# Patient Record
Sex: Female | Born: 1935 | Race: White | Hispanic: No | Smoking: Current every day smoker
Health system: Southern US, Community
[De-identification: ages and names within clinical notes are randomized; demographics above are authoritative.]

## PROBLEM LIST (undated history)

## (undated) DIAGNOSIS — K259 Gastric ulcer, unspecified as acute or chronic, without hemorrhage or perforation: Secondary | ICD-10-CM

## (undated) DIAGNOSIS — I739 Peripheral vascular disease, unspecified: Secondary | ICD-10-CM

## (undated) DIAGNOSIS — M79609 Pain in unspecified limb: Secondary | ICD-10-CM

## (undated) DIAGNOSIS — K5790 Diverticulosis of intestine, part unspecified, without perforation or abscess without bleeding: Secondary | ICD-10-CM

## (undated) DIAGNOSIS — J40 Bronchitis, not specified as acute or chronic: Secondary | ICD-10-CM

## (undated) DIAGNOSIS — I10 Essential (primary) hypertension: Secondary | ICD-10-CM

## (undated) DIAGNOSIS — I2510 Atherosclerotic heart disease of native coronary artery without angina pectoris: Secondary | ICD-10-CM

## (undated) DIAGNOSIS — F419 Anxiety disorder, unspecified: Secondary | ICD-10-CM

## (undated) DIAGNOSIS — M545 Low back pain, unspecified: Secondary | ICD-10-CM

## (undated) DIAGNOSIS — E7800 Pure hypercholesterolemia, unspecified: Secondary | ICD-10-CM

## (undated) DIAGNOSIS — J449 Chronic obstructive pulmonary disease, unspecified: Secondary | ICD-10-CM

## (undated) DIAGNOSIS — G4733 Obstructive sleep apnea (adult) (pediatric): Secondary | ICD-10-CM

## (undated) DIAGNOSIS — R0602 Shortness of breath: Secondary | ICD-10-CM

## (undated) DIAGNOSIS — M1990 Unspecified osteoarthritis, unspecified site: Secondary | ICD-10-CM

## (undated) DIAGNOSIS — Z8719 Personal history of other diseases of the digestive system: Secondary | ICD-10-CM

## (undated) DIAGNOSIS — Z86718 Personal history of other venous thrombosis and embolism: Secondary | ICD-10-CM

## (undated) DIAGNOSIS — R51 Headache: Secondary | ICD-10-CM

## (undated) DIAGNOSIS — R519 Headache, unspecified: Secondary | ICD-10-CM

## (undated) DIAGNOSIS — I219 Acute myocardial infarction, unspecified: Secondary | ICD-10-CM

## (undated) DIAGNOSIS — J189 Pneumonia, unspecified organism: Secondary | ICD-10-CM

## (undated) DIAGNOSIS — I209 Angina pectoris, unspecified: Secondary | ICD-10-CM

## (undated) DIAGNOSIS — D369 Benign neoplasm, unspecified site: Secondary | ICD-10-CM

## (undated) DIAGNOSIS — I701 Atherosclerosis of renal artery: Secondary | ICD-10-CM

## (undated) DIAGNOSIS — I509 Heart failure, unspecified: Secondary | ICD-10-CM

## (undated) DIAGNOSIS — K219 Gastro-esophageal reflux disease without esophagitis: Secondary | ICD-10-CM

## (undated) DIAGNOSIS — G459 Transient cerebral ischemic attack, unspecified: Secondary | ICD-10-CM

## (undated) DIAGNOSIS — A048 Other specified bacterial intestinal infections: Secondary | ICD-10-CM

## (undated) DIAGNOSIS — R490 Dysphonia: Secondary | ICD-10-CM

## (undated) DIAGNOSIS — R011 Cardiac murmur, unspecified: Secondary | ICD-10-CM

## (undated) DIAGNOSIS — R4182 Altered mental status, unspecified: Secondary | ICD-10-CM

## (undated) HISTORY — DX: Other specified bacterial intestinal infections: A04.8

## (undated) HISTORY — DX: Pure hypercholesterolemia, unspecified: E78.00

## (undated) HISTORY — DX: Bronchitis, not specified as acute or chronic: J40

## (undated) HISTORY — DX: Acute myocardial infarction, unspecified: I21.9

## (undated) HISTORY — PX: VAGINAL HYSTERECTOMY: SUR661

## (undated) HISTORY — DX: Pain in unspecified limb: M79.609

## (undated) HISTORY — DX: Obstructive sleep apnea (adult) (pediatric): G47.33

## (undated) HISTORY — DX: Personal history of other venous thrombosis and embolism: Z86.718

## (undated) HISTORY — DX: Altered mental status, unspecified: R41.82

## (undated) HISTORY — DX: Essential (primary) hypertension: I10

## (undated) HISTORY — DX: Transient cerebral ischemic attack, unspecified: G45.9

## (undated) HISTORY — DX: Pneumonia, unspecified organism: J18.9

## (undated) HISTORY — DX: Personal history of other diseases of the digestive system: Z87.19

## (undated) HISTORY — DX: Low back pain: M54.5

## (undated) HISTORY — DX: Low back pain, unspecified: M54.50

## (undated) HISTORY — DX: Chronic obstructive pulmonary disease, unspecified: J44.9

## (undated) HISTORY — DX: Gastro-esophageal reflux disease without esophagitis: K21.9

## (undated) HISTORY — DX: Atherosclerotic heart disease of native coronary artery without angina pectoris: I25.10

## (undated) HISTORY — PX: OOPHORECTOMY: SHX86

## (undated) HISTORY — DX: Peripheral vascular disease, unspecified: I73.9

## (undated) HISTORY — DX: Dysphonia: R49.0

## (undated) HISTORY — DX: Unspecified osteoarthritis, unspecified site: M19.90

## (undated) HISTORY — DX: Diverticulosis of intestine, part unspecified, without perforation or abscess without bleeding: K57.90

## (undated) HISTORY — DX: Atherosclerosis of renal artery: I70.1

## (undated) HISTORY — DX: Benign neoplasm, unspecified site: D36.9

## (undated) HISTORY — DX: Gastric ulcer, unspecified as acute or chronic, without hemorrhage or perforation: K25.9

---

## 1989-02-25 DIAGNOSIS — I219 Acute myocardial infarction, unspecified: Secondary | ICD-10-CM

## 1989-02-25 HISTORY — DX: Acute myocardial infarction, unspecified: I21.9

## 1997-06-27 DIAGNOSIS — I2510 Atherosclerotic heart disease of native coronary artery without angina pectoris: Secondary | ICD-10-CM | POA: Insufficient documentation

## 1997-10-22 ENCOUNTER — Inpatient Hospital Stay (HOSPITAL_COMMUNITY): Admission: EM | Admit: 1997-10-22 | Discharge: 1997-10-23 | Payer: Self-pay | Admitting: Emergency Medicine

## 1998-01-26 ENCOUNTER — Emergency Department (HOSPITAL_COMMUNITY): Admission: EM | Admit: 1998-01-26 | Discharge: 1998-01-26 | Payer: Self-pay | Admitting: Emergency Medicine

## 1998-07-07 ENCOUNTER — Inpatient Hospital Stay (HOSPITAL_COMMUNITY): Admission: EM | Admit: 1998-07-07 | Discharge: 1998-07-08 | Payer: Self-pay | Admitting: *Deleted

## 1998-07-07 ENCOUNTER — Encounter: Payer: Self-pay | Admitting: Cardiovascular Disease

## 1998-11-12 ENCOUNTER — Inpatient Hospital Stay (HOSPITAL_COMMUNITY): Admission: EM | Admit: 1998-11-12 | Discharge: 1998-11-14 | Payer: Self-pay | Admitting: Emergency Medicine

## 1998-11-14 ENCOUNTER — Encounter: Payer: Self-pay | Admitting: Cardiovascular Disease

## 1999-02-18 ENCOUNTER — Other Ambulatory Visit: Admission: RE | Admit: 1999-02-18 | Discharge: 1999-02-18 | Payer: Self-pay | Admitting: Obstetrics

## 1999-02-21 ENCOUNTER — Ambulatory Visit (HOSPITAL_COMMUNITY): Admission: RE | Admit: 1999-02-21 | Discharge: 1999-02-21 | Payer: Self-pay | Admitting: Family Medicine

## 1999-02-21 ENCOUNTER — Encounter: Payer: Self-pay | Admitting: Family Medicine

## 1999-03-05 ENCOUNTER — Encounter: Admission: RE | Admit: 1999-03-05 | Discharge: 1999-06-03 | Payer: Self-pay | Admitting: Family Medicine

## 1999-03-31 ENCOUNTER — Inpatient Hospital Stay (HOSPITAL_COMMUNITY): Admission: EM | Admit: 1999-03-31 | Discharge: 1999-04-02 | Payer: Self-pay | Admitting: Emergency Medicine

## 1999-03-31 ENCOUNTER — Encounter: Payer: Self-pay | Admitting: Emergency Medicine

## 1999-04-02 ENCOUNTER — Encounter: Payer: Self-pay | Admitting: Cardiology

## 1999-06-02 ENCOUNTER — Encounter: Admission: RE | Admit: 1999-06-02 | Discharge: 1999-08-31 | Payer: Self-pay | Admitting: Family Medicine

## 1999-07-13 ENCOUNTER — Ambulatory Visit (HOSPITAL_COMMUNITY): Admission: RE | Admit: 1999-07-13 | Discharge: 1999-07-13 | Payer: Self-pay | Admitting: Family Medicine

## 1999-07-13 ENCOUNTER — Encounter: Payer: Self-pay | Admitting: Family Medicine

## 1999-07-14 ENCOUNTER — Encounter: Admission: RE | Admit: 1999-07-14 | Discharge: 1999-10-12 | Payer: Self-pay | Admitting: Ophthalmology

## 1999-11-17 ENCOUNTER — Emergency Department (HOSPITAL_COMMUNITY): Admission: EM | Admit: 1999-11-17 | Discharge: 1999-11-17 | Payer: Self-pay | Admitting: Emergency Medicine

## 1999-11-17 ENCOUNTER — Encounter: Payer: Self-pay | Admitting: Emergency Medicine

## 1999-11-26 DIAGNOSIS — I701 Atherosclerosis of renal artery: Secondary | ICD-10-CM | POA: Insufficient documentation

## 1999-11-26 DIAGNOSIS — I739 Peripheral vascular disease, unspecified: Secondary | ICD-10-CM

## 1999-12-06 ENCOUNTER — Inpatient Hospital Stay (HOSPITAL_COMMUNITY): Admission: EM | Admit: 1999-12-06 | Discharge: 1999-12-08 | Payer: Self-pay | Admitting: Emergency Medicine

## 1999-12-07 ENCOUNTER — Encounter: Payer: Self-pay | Admitting: Cardiology

## 2000-01-10 ENCOUNTER — Encounter (HOSPITAL_COMMUNITY): Admission: RE | Admit: 2000-01-10 | Discharge: 2000-04-09 | Payer: Self-pay | Admitting: Emergency Medicine

## 2001-08-28 ENCOUNTER — Encounter: Payer: Self-pay | Admitting: Emergency Medicine

## 2001-08-29 ENCOUNTER — Inpatient Hospital Stay (HOSPITAL_COMMUNITY): Admission: EM | Admit: 2001-08-29 | Discharge: 2001-08-30 | Payer: Self-pay | Admitting: Emergency Medicine

## 2001-08-29 ENCOUNTER — Encounter: Payer: Self-pay | Admitting: Pediatrics

## 2001-10-05 ENCOUNTER — Encounter: Payer: Self-pay | Admitting: Internal Medicine

## 2001-10-05 ENCOUNTER — Ambulatory Visit (HOSPITAL_COMMUNITY): Admission: RE | Admit: 2001-10-05 | Discharge: 2001-10-05 | Payer: Self-pay | Admitting: Internal Medicine

## 2001-11-08 ENCOUNTER — Emergency Department (HOSPITAL_COMMUNITY): Admission: EM | Admit: 2001-11-08 | Discharge: 2001-11-09 | Payer: Self-pay | Admitting: Emergency Medicine

## 2001-11-08 ENCOUNTER — Encounter: Payer: Self-pay | Admitting: Emergency Medicine

## 2002-01-14 ENCOUNTER — Encounter: Payer: Self-pay | Admitting: Internal Medicine

## 2002-01-14 ENCOUNTER — Ambulatory Visit (HOSPITAL_COMMUNITY): Admission: RE | Admit: 2002-01-14 | Discharge: 2002-01-14 | Payer: Self-pay | Admitting: Internal Medicine

## 2002-04-19 ENCOUNTER — Ambulatory Visit (HOSPITAL_COMMUNITY): Admission: RE | Admit: 2002-04-19 | Discharge: 2002-04-19 | Payer: Self-pay | Admitting: Cardiovascular Disease

## 2002-04-19 ENCOUNTER — Encounter: Payer: Self-pay | Admitting: Cardiovascular Disease

## 2002-07-31 ENCOUNTER — Ambulatory Visit (HOSPITAL_COMMUNITY): Admission: RE | Admit: 2002-07-31 | Discharge: 2002-07-31 | Payer: Self-pay | Admitting: Internal Medicine

## 2002-07-31 ENCOUNTER — Encounter: Payer: Self-pay | Admitting: Internal Medicine

## 2002-08-26 DIAGNOSIS — J40 Bronchitis, not specified as acute or chronic: Secondary | ICD-10-CM

## 2002-09-04 ENCOUNTER — Encounter (INDEPENDENT_AMBULATORY_CARE_PROVIDER_SITE_OTHER): Payer: Self-pay | Admitting: *Deleted

## 2002-09-04 ENCOUNTER — Ambulatory Visit (HOSPITAL_COMMUNITY): Admission: RE | Admit: 2002-09-04 | Discharge: 2002-09-04 | Payer: Self-pay | Admitting: Internal Medicine

## 2002-09-06 ENCOUNTER — Ambulatory Visit (HOSPITAL_BASED_OUTPATIENT_CLINIC_OR_DEPARTMENT_OTHER): Admission: RE | Admit: 2002-09-06 | Discharge: 2002-09-06 | Payer: Self-pay | Admitting: Internal Medicine

## 2002-09-06 DIAGNOSIS — G4733 Obstructive sleep apnea (adult) (pediatric): Secondary | ICD-10-CM

## 2004-01-04 ENCOUNTER — Emergency Department (HOSPITAL_COMMUNITY): Admission: EM | Admit: 2004-01-04 | Discharge: 2004-01-04 | Payer: Self-pay | Admitting: Emergency Medicine

## 2004-06-27 HISTORY — PX: CORONARY ANGIOPLASTY WITH STENT PLACEMENT: SHX49

## 2004-10-04 ENCOUNTER — Ambulatory Visit (HOSPITAL_COMMUNITY): Admission: RE | Admit: 2004-10-04 | Discharge: 2004-10-05 | Payer: Self-pay | Admitting: Cardiovascular Disease

## 2004-11-03 ENCOUNTER — Ambulatory Visit: Payer: Self-pay | Admitting: Family Medicine

## 2004-12-21 ENCOUNTER — Emergency Department (HOSPITAL_COMMUNITY): Admission: EM | Admit: 2004-12-21 | Discharge: 2004-12-21 | Payer: Self-pay | Admitting: Family Medicine

## 2005-03-02 ENCOUNTER — Emergency Department (HOSPITAL_COMMUNITY): Admission: EM | Admit: 2005-03-02 | Discharge: 2005-03-02 | Payer: Self-pay | Admitting: Family Medicine

## 2005-04-14 ENCOUNTER — Ambulatory Visit: Payer: Self-pay | Admitting: Family Medicine

## 2005-06-25 ENCOUNTER — Emergency Department (HOSPITAL_COMMUNITY): Admission: EM | Admit: 2005-06-25 | Discharge: 2005-06-25 | Payer: Self-pay | Admitting: Family Medicine

## 2005-08-21 ENCOUNTER — Emergency Department (HOSPITAL_COMMUNITY): Admission: EM | Admit: 2005-08-21 | Discharge: 2005-08-21 | Payer: Self-pay | Admitting: Family Medicine

## 2005-08-25 DIAGNOSIS — Z8719 Personal history of other diseases of the digestive system: Secondary | ICD-10-CM | POA: Insufficient documentation

## 2005-09-01 ENCOUNTER — Ambulatory Visit: Payer: Self-pay | Admitting: Family Medicine

## 2005-09-02 ENCOUNTER — Ambulatory Visit (HOSPITAL_COMMUNITY): Admission: RE | Admit: 2005-09-02 | Discharge: 2005-09-02 | Payer: Self-pay | Admitting: Family Medicine

## 2005-09-02 ENCOUNTER — Ambulatory Visit: Payer: Self-pay | Admitting: Family Medicine

## 2005-09-08 ENCOUNTER — Ambulatory Visit: Payer: Self-pay | Admitting: Family Medicine

## 2005-09-09 ENCOUNTER — Inpatient Hospital Stay (HOSPITAL_COMMUNITY): Admission: EM | Admit: 2005-09-09 | Discharge: 2005-09-11 | Payer: Self-pay | Admitting: Emergency Medicine

## 2005-09-09 ENCOUNTER — Ambulatory Visit: Payer: Self-pay | Admitting: Gastroenterology

## 2005-09-10 ENCOUNTER — Encounter (INDEPENDENT_AMBULATORY_CARE_PROVIDER_SITE_OTHER): Payer: Self-pay | Admitting: Specialist

## 2005-09-10 ENCOUNTER — Encounter: Payer: Self-pay | Admitting: Internal Medicine

## 2005-09-11 ENCOUNTER — Encounter: Payer: Self-pay | Admitting: Internal Medicine

## 2005-09-11 ENCOUNTER — Encounter (INDEPENDENT_AMBULATORY_CARE_PROVIDER_SITE_OTHER): Payer: Self-pay | Admitting: *Deleted

## 2005-10-29 ENCOUNTER — Emergency Department (HOSPITAL_COMMUNITY): Admission: EM | Admit: 2005-10-29 | Discharge: 2005-10-29 | Payer: Self-pay | Admitting: Family Medicine

## 2005-12-07 ENCOUNTER — Emergency Department (HOSPITAL_COMMUNITY): Admission: EM | Admit: 2005-12-07 | Discharge: 2005-12-07 | Payer: Self-pay | Admitting: Family Medicine

## 2005-12-15 ENCOUNTER — Ambulatory Visit: Payer: Self-pay | Admitting: Family Medicine

## 2006-01-23 ENCOUNTER — Ambulatory Visit: Payer: Self-pay | Admitting: Family Medicine

## 2006-02-05 ENCOUNTER — Emergency Department (HOSPITAL_COMMUNITY): Admission: EM | Admit: 2006-02-05 | Discharge: 2006-02-05 | Payer: Self-pay | Admitting: Family Medicine

## 2006-03-02 ENCOUNTER — Ambulatory Visit: Payer: Self-pay | Admitting: Family Medicine

## 2006-03-10 ENCOUNTER — Ambulatory Visit: Payer: Self-pay | Admitting: Internal Medicine

## 2006-03-11 ENCOUNTER — Emergency Department (HOSPITAL_COMMUNITY): Admission: EM | Admit: 2006-03-11 | Discharge: 2006-03-11 | Payer: Self-pay | Admitting: Emergency Medicine

## 2006-03-16 ENCOUNTER — Ambulatory Visit: Payer: Self-pay | Admitting: Internal Medicine

## 2006-03-18 ENCOUNTER — Emergency Department (HOSPITAL_COMMUNITY): Admission: EM | Admit: 2006-03-18 | Discharge: 2006-03-18 | Payer: Self-pay | Admitting: Family Medicine

## 2006-04-24 ENCOUNTER — Ambulatory Visit: Payer: Self-pay | Admitting: Family Medicine

## 2006-08-21 ENCOUNTER — Ambulatory Visit: Payer: Self-pay | Admitting: Family Medicine

## 2006-08-30 ENCOUNTER — Inpatient Hospital Stay (HOSPITAL_COMMUNITY): Admission: EM | Admit: 2006-08-30 | Discharge: 2006-09-01 | Payer: Self-pay | Admitting: Emergency Medicine

## 2006-09-11 ENCOUNTER — Ambulatory Visit: Payer: Self-pay | Admitting: Family Medicine

## 2006-09-11 ENCOUNTER — Encounter: Payer: Self-pay | Admitting: Internal Medicine

## 2006-10-15 ENCOUNTER — Emergency Department (HOSPITAL_COMMUNITY): Admission: EM | Admit: 2006-10-15 | Discharge: 2006-10-15 | Payer: Self-pay | Admitting: Family Medicine

## 2006-11-15 ENCOUNTER — Emergency Department (HOSPITAL_COMMUNITY): Admission: EM | Admit: 2006-11-15 | Discharge: 2006-11-15 | Payer: Self-pay | Admitting: Family Medicine

## 2007-01-10 ENCOUNTER — Ambulatory Visit: Payer: Self-pay | Admitting: Family Medicine

## 2007-02-19 ENCOUNTER — Telehealth (INDEPENDENT_AMBULATORY_CARE_PROVIDER_SITE_OTHER): Payer: Self-pay | Admitting: Nurse Practitioner

## 2007-02-19 DIAGNOSIS — R51 Headache: Secondary | ICD-10-CM

## 2007-02-19 DIAGNOSIS — K219 Gastro-esophageal reflux disease without esophagitis: Secondary | ICD-10-CM

## 2007-02-19 DIAGNOSIS — R519 Headache, unspecified: Secondary | ICD-10-CM | POA: Insufficient documentation

## 2007-02-19 DIAGNOSIS — R498 Other voice and resonance disorders: Secondary | ICD-10-CM

## 2007-02-19 DIAGNOSIS — I10 Essential (primary) hypertension: Secondary | ICD-10-CM

## 2007-02-19 DIAGNOSIS — E7800 Pure hypercholesterolemia, unspecified: Secondary | ICD-10-CM

## 2007-02-19 DIAGNOSIS — J849 Interstitial pulmonary disease, unspecified: Secondary | ICD-10-CM | POA: Insufficient documentation

## 2007-02-19 DIAGNOSIS — M79609 Pain in unspecified limb: Secondary | ICD-10-CM

## 2007-02-19 DIAGNOSIS — M545 Low back pain: Secondary | ICD-10-CM

## 2007-02-21 ENCOUNTER — Encounter (INDEPENDENT_AMBULATORY_CARE_PROVIDER_SITE_OTHER): Payer: Self-pay | Admitting: Family Medicine

## 2007-02-21 ENCOUNTER — Ambulatory Visit: Payer: Self-pay | Admitting: Internal Medicine

## 2007-02-21 LAB — CONVERTED CEMR LAB
Alkaline Phosphatase: 92 units/L (ref 39–117)
Basophils Relative: 1 % (ref 0–1)
CO2: 21 meq/L (ref 19–32)
Eosinophils Absolute: 0.1 10*3/uL (ref 0.0–0.7)
Glucose, Bld: 121 mg/dL — ABNORMAL HIGH (ref 70–99)
Lymphocytes Relative: 28 % (ref 12–46)
MCHC: 31.3 g/dL (ref 30.0–36.0)
MCV: 90.7 fL (ref 78.0–100.0)
Neutro Abs: 6 10*3/uL (ref 1.7–7.7)
Platelets: 276 10*3/uL (ref 150–400)
RDW: 14.7 % — ABNORMAL HIGH (ref 11.5–14.0)
Total Bilirubin: 0.4 mg/dL (ref 0.3–1.2)

## 2007-04-11 ENCOUNTER — Emergency Department (HOSPITAL_COMMUNITY): Admission: EM | Admit: 2007-04-11 | Discharge: 2007-04-11 | Payer: Self-pay | Admitting: Family Medicine

## 2007-04-13 ENCOUNTER — Telehealth (INDEPENDENT_AMBULATORY_CARE_PROVIDER_SITE_OTHER): Payer: Self-pay | Admitting: *Deleted

## 2007-04-23 ENCOUNTER — Telehealth (INDEPENDENT_AMBULATORY_CARE_PROVIDER_SITE_OTHER): Payer: Self-pay | Admitting: Family Medicine

## 2007-05-29 ENCOUNTER — Telehealth (INDEPENDENT_AMBULATORY_CARE_PROVIDER_SITE_OTHER): Payer: Self-pay | Admitting: Family Medicine

## 2007-05-30 ENCOUNTER — Ambulatory Visit: Payer: Self-pay | Admitting: Family Medicine

## 2007-06-25 ENCOUNTER — Telehealth (INDEPENDENT_AMBULATORY_CARE_PROVIDER_SITE_OTHER): Payer: Self-pay | Admitting: Family Medicine

## 2007-07-01 ENCOUNTER — Emergency Department (HOSPITAL_COMMUNITY): Admission: EM | Admit: 2007-07-01 | Discharge: 2007-07-01 | Payer: Self-pay | Admitting: Emergency Medicine

## 2007-07-10 ENCOUNTER — Telehealth (INDEPENDENT_AMBULATORY_CARE_PROVIDER_SITE_OTHER): Payer: Self-pay | Admitting: Family Medicine

## 2007-07-25 ENCOUNTER — Ambulatory Visit: Payer: Self-pay | Admitting: Family Medicine

## 2007-08-15 ENCOUNTER — Telehealth (INDEPENDENT_AMBULATORY_CARE_PROVIDER_SITE_OTHER): Payer: Self-pay | Admitting: Family Medicine

## 2007-08-25 ENCOUNTER — Emergency Department (HOSPITAL_COMMUNITY): Admission: EM | Admit: 2007-08-25 | Discharge: 2007-08-25 | Payer: Self-pay | Admitting: Family Medicine

## 2007-10-16 ENCOUNTER — Emergency Department (HOSPITAL_COMMUNITY): Admission: EM | Admit: 2007-10-16 | Discharge: 2007-10-16 | Payer: Self-pay | Admitting: Family Medicine

## 2007-11-26 ENCOUNTER — Emergency Department (HOSPITAL_COMMUNITY): Admission: EM | Admit: 2007-11-26 | Discharge: 2007-11-26 | Payer: Self-pay | Admitting: Family Medicine

## 2007-12-14 ENCOUNTER — Telehealth (INDEPENDENT_AMBULATORY_CARE_PROVIDER_SITE_OTHER): Payer: Self-pay | Admitting: Family Medicine

## 2007-12-19 ENCOUNTER — Telehealth (INDEPENDENT_AMBULATORY_CARE_PROVIDER_SITE_OTHER): Payer: Self-pay | Admitting: Family Medicine

## 2007-12-19 ENCOUNTER — Encounter (INDEPENDENT_AMBULATORY_CARE_PROVIDER_SITE_OTHER): Payer: Self-pay | Admitting: Family Medicine

## 2008-01-31 ENCOUNTER — Emergency Department (HOSPITAL_COMMUNITY): Admission: EM | Admit: 2008-01-31 | Discharge: 2008-01-31 | Payer: Self-pay | Admitting: Family Medicine

## 2008-02-08 ENCOUNTER — Encounter (INDEPENDENT_AMBULATORY_CARE_PROVIDER_SITE_OTHER): Payer: Self-pay | Admitting: Family Medicine

## 2008-03-11 ENCOUNTER — Encounter (INDEPENDENT_AMBULATORY_CARE_PROVIDER_SITE_OTHER): Payer: Self-pay | Admitting: Family Medicine

## 2008-06-03 ENCOUNTER — Encounter (INDEPENDENT_AMBULATORY_CARE_PROVIDER_SITE_OTHER): Payer: Self-pay | Admitting: Family Medicine

## 2008-09-29 ENCOUNTER — Encounter (INDEPENDENT_AMBULATORY_CARE_PROVIDER_SITE_OTHER): Payer: Self-pay | Admitting: Family Medicine

## 2008-12-22 ENCOUNTER — Encounter (INDEPENDENT_AMBULATORY_CARE_PROVIDER_SITE_OTHER): Payer: Self-pay | Admitting: Nurse Practitioner

## 2009-01-30 ENCOUNTER — Encounter (INDEPENDENT_AMBULATORY_CARE_PROVIDER_SITE_OTHER): Payer: Self-pay | Admitting: Nurse Practitioner

## 2009-02-26 ENCOUNTER — Emergency Department (HOSPITAL_COMMUNITY): Admission: EM | Admit: 2009-02-26 | Discharge: 2009-02-26 | Payer: Self-pay | Admitting: Family Medicine

## 2009-03-19 ENCOUNTER — Inpatient Hospital Stay (HOSPITAL_COMMUNITY): Admission: EM | Admit: 2009-03-19 | Discharge: 2009-03-23 | Payer: Self-pay | Admitting: Emergency Medicine

## 2009-10-11 ENCOUNTER — Emergency Department (HOSPITAL_COMMUNITY): Admission: EM | Admit: 2009-10-11 | Discharge: 2009-10-11 | Payer: Self-pay | Admitting: Family Medicine

## 2010-06-27 DIAGNOSIS — D369 Benign neoplasm, unspecified site: Secondary | ICD-10-CM

## 2010-06-27 HISTORY — DX: Benign neoplasm, unspecified site: D36.9

## 2010-07-27 ENCOUNTER — Encounter
Admission: RE | Admit: 2010-07-27 | Discharge: 2010-07-27 | Payer: Self-pay | Source: Home / Self Care | Attending: Family Medicine | Admitting: Family Medicine

## 2010-07-27 ENCOUNTER — Encounter: Payer: Self-pay | Admitting: Internal Medicine

## 2010-07-28 DIAGNOSIS — G459 Transient cerebral ischemic attack, unspecified: Secondary | ICD-10-CM

## 2010-07-28 HISTORY — DX: Transient cerebral ischemic attack, unspecified: G45.9

## 2010-08-06 ENCOUNTER — Encounter (INDEPENDENT_AMBULATORY_CARE_PROVIDER_SITE_OTHER): Payer: Self-pay | Admitting: *Deleted

## 2010-08-09 ENCOUNTER — Emergency Department (HOSPITAL_COMMUNITY): Payer: Medicare Other

## 2010-08-09 ENCOUNTER — Emergency Department (HOSPITAL_COMMUNITY)
Admission: EM | Admit: 2010-08-09 | Discharge: 2010-08-10 | Disposition: A | Discharge status: Home / Self Care | Payer: Medicare Other | Attending: Emergency Medicine | Admitting: Emergency Medicine

## 2010-08-09 DIAGNOSIS — J4489 Other specified chronic obstructive pulmonary disease: Secondary | ICD-10-CM | POA: Insufficient documentation

## 2010-08-09 DIAGNOSIS — IMO0002 Reserved for concepts with insufficient information to code with codable children: Secondary | ICD-10-CM | POA: Insufficient documentation

## 2010-08-09 DIAGNOSIS — I252 Old myocardial infarction: Secondary | ICD-10-CM | POA: Insufficient documentation

## 2010-08-09 DIAGNOSIS — I10 Essential (primary) hypertension: Secondary | ICD-10-CM | POA: Insufficient documentation

## 2010-08-09 DIAGNOSIS — J449 Chronic obstructive pulmonary disease, unspecified: Secondary | ICD-10-CM | POA: Insufficient documentation

## 2010-08-09 DIAGNOSIS — R51 Headache: Secondary | ICD-10-CM | POA: Insufficient documentation

## 2010-08-09 DIAGNOSIS — S1093XA Contusion of unspecified part of neck, initial encounter: Secondary | ICD-10-CM | POA: Insufficient documentation

## 2010-08-09 DIAGNOSIS — K219 Gastro-esophageal reflux disease without esophagitis: Secondary | ICD-10-CM | POA: Insufficient documentation

## 2010-08-09 DIAGNOSIS — I2510 Atherosclerotic heart disease of native coronary artery without angina pectoris: Secondary | ICD-10-CM | POA: Insufficient documentation

## 2010-08-09 DIAGNOSIS — S0990XA Unspecified injury of head, initial encounter: Secondary | ICD-10-CM | POA: Insufficient documentation

## 2010-08-09 DIAGNOSIS — E7800 Pure hypercholesterolemia, unspecified: Secondary | ICD-10-CM | POA: Insufficient documentation

## 2010-08-09 DIAGNOSIS — S0003XA Contusion of scalp, initial encounter: Secondary | ICD-10-CM | POA: Insufficient documentation

## 2010-08-09 LAB — BASIC METABOLIC PANEL
BUN: 18 mg/dL (ref 6–23)
Chloride: 109 mEq/L (ref 96–112)
Creatinine, Ser: 0.96 mg/dL (ref 0.4–1.2)
Glucose, Bld: 113 mg/dL — ABNORMAL HIGH (ref 70–99)
Potassium: 3.4 mEq/L — ABNORMAL LOW (ref 3.5–5.1)

## 2010-08-09 LAB — DIFFERENTIAL
Eosinophils Relative: 0 % (ref 0–5)
Lymphocytes Relative: 25 % (ref 12–46)
Lymphs Abs: 2.2 10*3/uL (ref 0.7–4.0)
Monocytes Relative: 9 % (ref 3–12)
Neutrophils Relative %: 65 % (ref 43–77)

## 2010-08-09 LAB — CBC
HCT: 34.5 % — ABNORMAL LOW (ref 36.0–46.0)
MCH: 29.3 pg (ref 26.0–34.0)
MCV: 88.7 fL (ref 78.0–100.0)
RBC: 3.89 MIL/uL (ref 3.87–5.11)
WBC: 8.7 10*3/uL (ref 4.0–10.5)

## 2010-08-10 ENCOUNTER — Encounter: Payer: Self-pay | Admitting: Internal Medicine

## 2010-08-10 LAB — TYPE AND SCREEN
ABO/RH(D): O POS
Antibody Screen: NEGATIVE

## 2010-08-11 ENCOUNTER — Encounter (INDEPENDENT_AMBULATORY_CARE_PROVIDER_SITE_OTHER): Payer: Self-pay | Admitting: *Deleted

## 2010-08-13 ENCOUNTER — Telehealth: Payer: Self-pay | Admitting: Gastroenterology

## 2010-08-13 ENCOUNTER — Encounter: Payer: Self-pay | Admitting: Internal Medicine

## 2010-08-13 ENCOUNTER — Other Ambulatory Visit: Payer: Self-pay | Admitting: Internal Medicine

## 2010-08-13 DIAGNOSIS — Z1231 Encounter for screening mammogram for malignant neoplasm of breast: Secondary | ICD-10-CM
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
## 2010-08-18 NOTE — Miscellaneous (Signed)
Summary: COLONOSCOPY  Clinical Lists Changes  <no value>    [Prescriptions]Patient Name: Kathy, Marshall MRN: [id number] Procedure Procedures: Colonoscopy CPT: [phone].  Personnel: Endoscopist: Debra Colon L. Juanda Chance, MD.  Exam Location: Exam performed in Endoscopy Suite.  Patient Consent: Procedure, Alternatives, Risks and Benefits discussed, consent obtained, from patient. Consent was obtained by the RN.  Indications  Evaluation of: Anemia  Symptoms: Hematochezia.  History  Current Medications: Patient is on an anticoagulant. Patient is not currently taking Coumadin.  Pre-Exam Physical: Performed Sep 10, 2005. Entire physical exam was normal. Abnormal PE findings include: maroon stool.  Comments: Pt. history reviewed/updated, physical exam performed prior to initiation of sedation? Exam Exam: Extent of exam reached: Cecum, extent intended: Cecum.  The cecum was identified by appendiceal orifice and IC valve. Images taken. ASA Classification: II. Tolerance: good.  Monitoring: Pulse and BP monitoring, Oximetry used. Supplemental O2 given.  Colon Prep Used Miralax for colon prep. Prep results: fair, exam compromised.  Fluoroscopy: Fluoroscopy was not used.  Sedation Meds: Patient assessed and found to be appropriate for moderate (conscious) sedation. Fentanyl Versed  Findings - NORMAL EXAM: Cecum. Comments: greenish brown stool.  - DIVERTICULOSIS: Descending Colon to Sigmoid Colon. Not bleeding. ICD9: Diverticulosis, with bleeding: 562.12. Comments: moderately severe left colon diverticulosis,.  - OTHER FINDING: maroon brown stained liquid stool, found in Sigmoid Colon.   Assessment Abnormal examination, see findings above.  Diagnoses: 562.12: Diverticulosis, with bleeding.   Comments: left colon bleed, resolving, probably diverticular Events  Unplanned Interventions: No intervention was required.  Unplanned Events: There were no  complications. Plans Patient Education: Patient given standard instructions for: Yearly hemoccult testing recommended.  Comments: see chart for plan of treatment Disposition: After procedure patient sent to recovery.    cc: Sherin Quarry, MD This report was created from the original endoscopy report, which was reviewed and signed by the above listed endoscopist.

## 2010-08-18 NOTE — Progress Notes (Signed)
Summary: Triage  Phone Note Call from Patient Call back at Home Phone [phone]   Caller: Morrie Sheldon from [facility] Call For: Jarold Motto Reason for Call: Talk to Nurse Summary of Call: [phone] choose 0 and ask for [facility] after 1:oo, patient has had 50 LB weight loss in one year and they can not find a cause, Doctor is requesting her to be seen and then a possible colon or EGD, they have no record of GI hx and Patient has no recolection of ever seeing GI or having a colon, want her seen sooner that our first available Initial call taken by: [place] Johnson,  August 13, 2010 11:54 AM  Follow-up for Phone Call        Spoke with patient and her daughter informing them of appointment with Dr Juanda Chance. Daughter stated understanding. Follow-up by: Graciella Freer RN,  August 13, 2010 3:22 PM

## 2010-08-18 NOTE — Miscellaneous (Signed)
Summary: CT SCAN Chest Abdomen Pelvis  CT Abd/Pelvis W CM - STATUS: Final  IMAGE                                     Perform Date: 31Jan12 14:40  Ordered By: Ethelene Browns Date: 31Jan12 12:09  Facility: [facility]                              Department: CT  Service Report Text  [facility] Accession Number: [id number]      Clinical Data:  Dysphagia, 60 pounds weight loss over last 8-10   months, history of smoking    CT CHEST, ABDOMEN AND PELVIS WITH CONTRAST    Technique:  Multidetector CT imaging of the chest, abdomen and   pelvis was performed following the standard protocol during bolus   administration of intravenous contrast.    Contrast: 100 ml Omnipaque-300    Comparison:  Chest x-ray of 10/11/2009    CT CHEST    Findings:  On the lung window images there are only faint   questionable nodules in the posterior medial right upper lobe   inferiorly of questionable significance.  No other lung nodules are   seen.  There is a triangular opacity within the medial right lower   lobe adjacent to the posterior heart border most consistent with   scarring.  No definite pulmonary nodule is seen.  No pleural   effusion is noted.    On soft tissue window images, the thyroid gland is unremarkable.   Atheromatous changes noted throughout the entire thoracic aorta.   Coronary artery calcifications are noted and there is cardiomegaly   present.  There are a few mediastinal nodes, none of which are   pathologically enlarged.    IMPRESSION:    1.  Poorly defined small nodular opacities in the posterior medial   right upper lobe of doubtful significance.  No active infiltrate or   effusion.   2.  Cardiomegaly and coronary artery calcifications.   3.  Diffuse atheromatous change throughout the thoracic aorta.   4.  Probable scarring medially in the right lower lobe.    CT ABDOMEN AND PELVIS    Findings:  The liver enhances and there is a tiny peripheral   subcapsular  low attenuation structure in the lateral segment of the   left lobe of liver of doubtful significance.  No ductal dilatation   is seen.  The portal vein opacifies.  The gallbladder is visualized   and no gallstones are seen.  The pancreas is somewhat atrophic and   the pancreatic duct is not dilated.  The adrenal glands and spleen   are unremarkable.  The stomach is moderately fluid distended and   unremarkable.  Sizable renal cysts emanate from the upper poles of   both kidneys.  No renal mass is seen and there is no evidence of   calculus or hydronephrosis.  The abdominal aorta is normal in   caliber with diffuse atheromatous change.  No adenopathy is seen.    There is feces scattered throughout the colon.  No colonic masses   evident by CT.  The urinary bladder is unremarkable.  No pelvic   mass is seen and no fluid or adenopathy is noted.  No bony   abnormality  is seen.    IMPRESSION:    1.  No abdominal or pelvic mass or adenopathy.   2.  Moderate amount of feces throughout the colon.    Read By:  Juline Patch,  M.D.   Released By:  Juline Patch,  M.D.  Additional Information  HL7 RESULT STATUS : F  External image : [id number]  External IF Update Timestamp : 2010-07-27:15:08:29.000000 Clinical Lists Changes

## 2010-08-18 NOTE — Miscellaneous (Signed)
Summary: Discharge Summary   NAME:  Kathy Marshall NO.:  [id number]   MEDICAL RECORD NO.:  [id number]          PATIENT TYPE:  INP   LOCATION:  2009                         FACILITY:  [facility]   PHYSICIAN:  Sherin Quarry, MD      DATE OF BIRTH:  [birth year]   DATE OF ADMISSION:  09/08/2005  DATE OF DISCHARGE:  09/11/2005                                 DISCHARGE SUMMARY   Kathy Marshall is a 76 year old lady with a past history of coronary  artery disease status post PTCA, COPD, hypertension, hyperlipidemia,  peripheral vascular disease and chronic tobacco abuse. The patient presented  to [facility] Emergency Room on September 08, 2005 with a one-day  history of rectal bleeding. During that period of time, she also experienced  intermittent abdominal cramping. Rectal exam in the emergency room confirmed  the presence of rectal bleeding. The patient was noted to have a blood  pressure 110/69 and her hemoglobin was 12.6. She was therefore admitted to  the hospital for further evaluation of rectal bleeding.   Physical exam at time of admission as described by Dr. Donnalee Curry: The  temperature was 99, blood pressure 110/69, pulse 66, respirations 20, O2  saturation 99%. HEENT exam is within normal limits. The chest was remarkable  for coarse breath sounds which were slightly diminished. Cardiovascular exam  revealed normal S1-S2. There were no rubs, murmurs or gallops. The abdomen  was soft. Bowel sounds were present. There was very mild lower quadrant  abdominal tenderness without guarding or rebound. Neurologic testing was  within normal limits. Examination of the extremities was within normal  limits.   INR was 0.9. Sodium 138, potassium 4.4, glucose was 109, creatinine 1, BUN  26. Liver functions were normal. On admission, the patient's Plavix therapy  was discontinued and she was continued on her other medications.  Consultation was obtained from Dr.  Christella Hartigan of [facility] and  plan was made to proceed with upper endoscopy and colon examination. Upper  endoscopy showed evidence of esophagitis apparently secondary to Candida.  Colonoscopy was not ideally prepped but no bleeding lesions could be  identified and the patient was noted to have moderate diverticulosis. It was  assumed by Dr. Juanda Chance that the source of the patient's lower  gastrointestinal bleeding was diverticulosis. No further bleeding occurred.  On September 11, 2005, the patient was advanced to a regular diet which she  seemed to tolerate well. Her hemoglobin was very closely monitored in the  hospital and was in the range of 10-11 throughout the hospitalization.   DISCHARGE DIAGNOSES:  1.  Fungal esophagitis.  2.  Lower gastrointestinal bleeding presumably secondary to diverticulosis.  3.  Hypertension.  4.  Coronary disease and status post percutaneous transluminal coronary      angioplasty.  5.  Peripheral vascular disease.  6.  Chronic obstructive pulmonary disease.  7.  Hyperlipidemia.   At the time of discharge, the patient was advised to stop Plavix for a  period of 10 days. She was also advised to continue Prevacid, Lasix 20  milligrams daily, nitroglycerin p.r.n., Lipitor 80 milligrams  at bedtime  daily, Toprol 50 milligrams daily, Neurontin 300 milligrams at bedtime  daily, Isordil 120 milligrams daily, Norvasc 10 milligrams daily and Pletal  100 milligrams twice daily.   She was advised to follow up with [facility] in two to three days. She was  also advised to take Diflucan 100 milligrams daily for three days to take  iron 325 milligrams b.i.d. and to take Metamucil one dose daily. When she  returns [facility], it would be prudent to obtain a follow-up  hemoglobin/hematocrit.           ______________________________  Sherin Quarry, MD     SY/MEDQ  D:  09/11/2005  T:  09/12/2005  Job:  [id number]   cc:   [name], M.D. [facility]  [address]  [place]  [place] [address]   [facility]

## 2010-08-18 NOTE — Letter (Signed)
Summary: New Patient letter  [facility]  [address] [place], [place] [address]   Phone: [phone]  Fax: [phone]       08/13/2010 MRN: [id number]  [facility] [address] [facility] [place], [place]  [address]  Dear Kathy Marshall,  Welcome to the Gastroenterology Division at [facility].    You are scheduled to see Dr.  [name] on August 25, 2010 at 11:15am on the 3rd floor at [facility], [address]  We ask that you try to arrive at our office 15 minutes prior to your appointment time to allow for check-in.  We would like you to complete the enclosed self-administered evaluation form prior to your visit and bring it with you on the day of your appointment.  We will review it with you.  Also, please bring a complete list of all your medications or, if you prefer, bring the medication bottles and we will list them.  Please bring your insurance card so that we may make a copy of it.  If your insurance requires a referral to see a specialist, please bring your referral form from your primary care physician.  Co-payments are due at the time of your visit and may be paid by cash, check or credit card.     Your office visit will consist of a consult with your physician (includes a physical exam), any laboratory testing he/she may order, scheduling of any necessary diagnostic testing (e.g. x-ray, ultrasound, CT-scan), and scheduling of a procedure (e.g. Endoscopy, Colonoscopy) if required.  Please allow enough time on your schedule to allow for any/all of these possibilities.    If you cannot keep your appointment, please call [phone] to cancel or reschedule prior to your appointment date.  This allows [place] the opportunity to schedule an appointment for another patient in need of care.  If you do not cancel or reschedule by 5 p.m. the business day prior to your appointment date, you will be charged a $50.00 late cancellation/no-show fee.      Thank you for choosing [facility] for your medical needs.  We appreciate the opportunity to care for you.  Please visit [place] at our website  to learn more about our practice.                     Sincerely,                                                             The Gastroenterology Division

## 2010-08-19 DIAGNOSIS — R634 Abnormal weight loss: Secondary | ICD-10-CM | POA: Insufficient documentation

## 2010-08-19 DIAGNOSIS — K5730 Diverticulosis of large intestine without perforation or abscess without bleeding: Secondary | ICD-10-CM | POA: Insufficient documentation

## 2010-08-19 DIAGNOSIS — I252 Old myocardial infarction: Secondary | ICD-10-CM | POA: Insufficient documentation

## 2010-08-23 ENCOUNTER — Other Ambulatory Visit: Payer: Self-pay | Admitting: Oncology

## 2010-08-23 ENCOUNTER — Ambulatory Visit: Payer: Medicare Other

## 2010-08-24 NOTE — Letter (Signed)
Summary: [facility]   Imported By: Lamona Curl CMA (AAMA) 08/20/2010 16:43:25  _____________________________________________________________________  External Attachment:    Type:   Image     Comment:   External Document

## 2010-08-24 NOTE — Letter (Signed)
Summary: [facility]  [facility] Office Visit   Imported By: Lamona Curl CMA (AAMA) 08/20/2010 16:41:35  _____________________________________________________________________  External Attachment:    Type:   Image     Comment:   External Document

## 2010-08-24 NOTE — Discharge Summary (Signed)
Summary: Rectal Bleeding   NAME:  Kathy Marshall, Kathy Marshall          ACCOUNT NO.:  [id number]   MEDICAL RECORD NO.:  [id number]          PATIENT TYPE:  INP   LOCATION:  2009                         FACILITY:  [facility]   PHYSICIAN:  Sherin Quarry, MD      DATE OF BIRTH:  Jan 15, 1935   DATE OF ADMISSION:  09/08/2005  DATE OF DISCHARGE:  09/11/2005                                 DISCHARGE SUMMARY   Kathy Marshall is a 76 year old lady with a past history of coronary  artery disease status post PTCA, COPD, hypertension, hyperlipidemia,  peripheral vascular disease and chronic tobacco abuse. The patient presented  to [facility] Emergency Room on September 08, 2005 with a one-day  history of rectal bleeding. During that period of time, she also experienced  intermittent abdominal cramping. Rectal exam in the emergency room confirmed  the presence of rectal bleeding. The patient was noted to have a blood  pressure 110/69 and her hemoglobin was 12.6. She was therefore admitted to  the hospital for further evaluation of rectal bleeding.   Physical exam at time of admission as described by Dr. Donnalee Curry: The  temperature was 99, blood pressure 110/69, pulse 66, respirations 20, O2  saturation 99%. HEENT exam is within normal limits. The chest was remarkable  for coarse breath sounds which were slightly diminished. Cardiovascular exam  revealed normal S1-S2. There were no rubs, murmurs or gallops. The abdomen  was soft. Bowel sounds were present. There was very mild lower quadrant  abdominal tenderness without guarding or rebound. Neurologic testing was  within normal limits. Examination of the extremities was within normal  limits.   INR was 0.9. Sodium 138, potassium 4.4, glucose was 109, creatinine 1, BUN  26. Liver functions were normal. On admission, the patient's Plavix therapy  was discontinued and she was continued on her other medications.  Consultation was obtained from Dr.  Christella Hartigan of [facility] and  plan was made to proceed with upper endoscopy and colon examination. Upper  endoscopy showed evidence of esophagitis apparently secondary to Candida.  Colonoscopy was not ideally prepped but no bleeding lesions could be  identified and the patient was noted to have moderate diverticulosis. It was  assumed by Dr. Juanda Chance that the source of the patient's lower  gastrointestinal bleeding was diverticulosis. No further bleeding occurred.  On September 11, 2005, the patient was advanced to a regular diet which she  seemed to tolerate well. Her hemoglobin was very closely monitored in the  hospital and was in the range of 10-11 throughout the hospitalization.   DISCHARGE DIAGNOSES:  1.  Fungal esophagitis.  2.  Lower gastrointestinal bleeding presumably secondary to diverticulosis.  3.  Hypertension.  4.  Coronary disease and status post percutaneous transluminal coronary      angioplasty.  5.  Peripheral vascular disease.  6.  Chronic obstructive pulmonary disease.  7.  Hyperlipidemia.   At the time of discharge, the patient was advised to stop Plavix for a  period of 10 days. She was also advised to continue Prevacid, Lasix 20  milligrams daily, nitroglycerin p.r.n., Lipitor 80 milligrams  at bedtime  daily, Toprol 50 milligrams daily, Neurontin 300 milligrams at bedtime  daily, Isordil 120 milligrams daily, Norvasc 10 milligrams daily and Pletal  100 milligrams twice daily.   She was advised to follow up with [facility] in two to three days. She was  also advised to take Diflucan 100 milligrams daily for three days to take  iron 325 milligrams b.i.d. and to take Metamucil one dose daily. When she  returns [facility], it would be prudent to obtain a follow-up  hemoglobin/hematocrit.           ______________________________  Sherin Quarry, MD     SY/MEDQ  D:  09/11/2005  T:  09/12/2005  Job:  [id number]   cc:   [name], M.D. [facility]  [address]  [place]  [place] [address]   [facility]

## 2010-08-24 NOTE — Procedures (Signed)
Summary: COLON   Colonoscopy  Procedure date:  09/10/2005  Findings:      Location:  [facility].   Patient Name: Kathy Marshall, Kathy Marshall MRN: [id number] Procedure Procedures: Colonoscopy CPT: 62130.  Personnel: Endoscopist: Dora L. Juanda Chance, MD.  Exam Location: Exam performed in Endoscopy Suite.  Patient Consent: Procedure, Alternatives, Risks and Benefits discussed, consent obtained, from patient. Consent was obtained by the RN.  Indications  Evaluation of: Anemia  Symptoms: Hematochezia.  History  Current Medications: Patient is on an anticoagulant. Patient is not currently taking Coumadin.  Pre-Exam Physical: Performed Sep 10, 2005. Entire physical exam was normal. Abnormal PE findings include: maroon stool.  Comments: Pt. history reviewed/updated, physical exam performed prior to initiation of sedation? Exam Exam: Extent of exam reached: Cecum, extent intended: Cecum.  The cecum was identified by appendiceal orifice and IC valve. Images taken. ASA Classification: II. Tolerance: good.  Monitoring: Pulse and BP monitoring, Oximetry used. Supplemental O2 given.  Colon Prep Used Miralax for colon prep. Prep results: fair, exam compromised.  Fluoroscopy: Fluoroscopy was not used.  Sedation Meds: Patient assessed and found to be appropriate for moderate (conscious) sedation. Fentanyl Versed  Findings - NORMAL EXAM: Cecum. Comments: greenish brown stool.  - DIVERTICULOSIS: Descending Colon to Sigmoid Colon. Not bleeding. ICD9: Diverticulosis, with bleeding: 562.12. Comments: moderately severe left colon diverticulosis,.  - OTHER FINDING: maroon brown stained liquid stool, found in Sigmoid Colon.   Assessment Abnormal examination, see findings above.  Diagnoses: 562.12: Diverticulosis, with bleeding.   Comments: left colon bleed, resolving, probably diverticular Events  Unplanned Interventions: No intervention was required.  Unplanned Events: There  were no complications. Plans Patient Education: Patient given standard instructions for: Yearly hemoccult testing recommended.  Comments: see chart for plan of treatment Disposition: After procedure patient sent to recovery.   This report was created from the original endoscopy report, which was reviewed and signed by the above listed endoscopist.

## 2010-08-24 NOTE — Procedures (Signed)
Summary: EGD   EGD  Procedure date:  09/10/2005  Findings:      Location: [facility]   Patient Name: Kathy Marshall, Kathy Marshall MRN: [id number] Procedure Procedures: Panendoscopy (EGD) CPT: 43235.    with biopsy(s)/brushing(s). CPT: D1846139.  Personnel: Endoscopist: Arnold Depinto L. Juanda Chance, MD.  Exam Location: Exam performed in Endoscopy Suite.  Patient Consent: Procedure, Alternatives, Risks and Benefits discussed, consent obtained, from patient. Consent was obtained by the RN.  Indications  Evaluation of: Anemia,  Positive fecal occult blood test per digital rectal exam.  Symptoms: Abdominal pain, maroon hematochezia.  History  Current Medications: Patient is on an anticoagulant. Patient is not currently taking Coumadin.  Pre-Exam Physical: Performed Sep 10, 2005  Entire physical exam was normal. Abnormal PE findings include: maroon stool.  Comments: Pt. history reviewed/updated, physical exam performed prior to initiation of sedation? Exam Exam Info: Maximum depth of insertion Duodenum, intended Duodenum. Vocal cords visualized. Gastric retroflexion performed. Images taken. ASA Classification: II. Tolerance: good.  Sedation Meds: Patient assessed and found to be appropriate for moderate (conscious) sedation. Fentanyl 25 mcg. given IV. Versed 3 mg. given IV. Cetacaine Spray 2 sprays given aerosolized.  Monitoring: BP and pulse monitoring done. Oximetry used. Supplemental O2 given  Fluoroscopy: Fluoroscopy was not used.  Findings - ESOPHAGEAL INFLAMMATION: suspected as a result of infectious esophagitis. Severity is mild, erythema only.  [place] Classification: Grade 0. Biopsy/Esoph Inflamtn taken. ICD9: Esophagitis, Other: 530.19. Comments: white exudate c/w Candida esophagitis.  - MUCOSAL ABNORMALITY: Body to Antrum. Erythematous mucosa. Biopsy/Mucosal Abn taken. ICD9: Gastritis, Acute: 535.00. Comment: nonspecific gastritis, no bleeding.  DIAGNOSTIC TEST:  Biopsy taken. from Duodenal Apex.   Assessment Abnormal examination, see findings above.  Diagnoses: 530.19: Esophagitis, Other.  535.00: Gastritis, Acute.   Comments: nothing to explain rectal bleeding Events  Unplanned Intervention: No unplanned interventions were required.  Unplanned Events: There were no complications. Plans Medication(s): Await pathology. Diflucan: 100mg  QD, starting Sep 10, 2005  PPI: Pantoprazole/Protonix 40 mg QD, starting Sep 10, 2005   Comments: colonoscopy Disposition: After procedure patient sent to recovery.   This report was created from the original endoscopy report, which was reviewed and signed by the above listed endoscopist.

## 2010-08-24 NOTE — Letter (Signed)
Summary: [facility]   Imported By: Lamona Curl CMA (AAMA) 08/20/2010 16:43:45  _____________________________________________________________________  External Attachment:    Type:   Image     Comment:   External Document

## 2010-08-25 ENCOUNTER — Ambulatory Visit: Payer: Medicare Other | Admitting: Internal Medicine

## 2010-09-12 ENCOUNTER — Inpatient Hospital Stay (INDEPENDENT_AMBULATORY_CARE_PROVIDER_SITE_OTHER)
Admission: RE | Admit: 2010-09-12 | Discharge: 2010-09-12 | Disposition: A | Discharge status: Home / Self Care | Payer: Medicare Other | Source: Ambulatory Visit | Attending: Family Medicine | Admitting: Family Medicine

## 2010-09-12 DIAGNOSIS — J40 Bronchitis, not specified as acute or chronic: Secondary | ICD-10-CM

## 2010-09-12 DIAGNOSIS — R112 Nausea with vomiting, unspecified: Secondary | ICD-10-CM

## 2010-09-12 LAB — POCT I-STAT, CHEM 8
Glucose, Bld: 99 mg/dL (ref 70–99)
HCT: 36 % (ref 36.0–46.0)
Hemoglobin: 12.2 g/dL (ref 12.0–15.0)
Potassium: 3.6 mEq/L (ref 3.5–5.1)
Sodium: 144 mEq/L (ref 135–145)

## 2010-09-27 ENCOUNTER — Encounter: Payer: Self-pay | Admitting: *Deleted

## 2010-09-29 ENCOUNTER — Telehealth: Payer: Self-pay | Admitting: *Deleted

## 2010-09-29 ENCOUNTER — Ambulatory Visit: Payer: Medicare Other | Admitting: Internal Medicine

## 2010-09-29 NOTE — Telephone Encounter (Signed)
Patient's daughter called and cancelled appointment same day for patient because she is vomiting. I have called back to speak to daughter to advise her that although patient is vomiting, we do need to see her in the office to evaluate her. She has had weight loss and vomiting and per [facility] needs to be further evaluated. Also reminded daughter of no show/late cancellation fee. Daughter states that she and patient have discussed this and patient still feels that she is unable to come but will come in June 2012. Advised daughter again of Dr Regino Schultze recommendations for evaluation and she verbalizes understanding. Dr Juanda Chance also advised of continued decision to reschedule.

## 2010-10-01 LAB — COMPREHENSIVE METABOLIC PANEL
Albumin: 3.3 g/dL — ABNORMAL LOW (ref 3.5–5.2)
BUN: 13 mg/dL (ref 6–23)
CO2: 23 mEq/L (ref 19–32)
Chloride: 110 mEq/L (ref 96–112)
Creatinine, Ser: 0.72 mg/dL (ref 0.4–1.2)
GFR calc non Af Amer: >60 mL/min (ref >60)
Total Bilirubin: 0.5 mg/dL (ref 0.3–1.2)

## 2010-10-01 LAB — BASIC METABOLIC PANEL
BUN: 13 mg/dL (ref 6–23)
CO2: 24 mEq/L (ref 19–32)
Calcium: 9.1 mg/dL (ref 8.4–10.5)
Calcium: 9.1 mg/dL (ref 8.4–10.5)
Calcium: 9.3 mg/dL (ref 8.4–10.5)
Chloride: 109 mEq/L (ref 96–112)
Creatinine, Ser: 0.62 mg/dL (ref 0.4–1.2)
Creatinine, Ser: 0.68 mg/dL (ref 0.4–1.2)
Creatinine, Ser: 0.69 mg/dL (ref 0.4–1.2)
GFR calc Af Amer: >60 mL/min (ref >60)
GFR calc Af Amer: >60 mL/min (ref >60)
GFR calc Af Amer: >60 mL/min (ref >60)
GFR calc non Af Amer: >60 mL/min (ref >60)
GFR calc non Af Amer: >60 mL/min (ref >60)
GFR calc non Af Amer: >60 mL/min (ref >60)
Glucose, Bld: 144 mg/dL — ABNORMAL HIGH (ref 70–99)
Glucose, Bld: 164 mg/dL — ABNORMAL HIGH (ref 70–99)
Potassium: 4.4 mEq/L (ref 3.5–5.1)
Potassium: 4.6 mEq/L (ref 3.5–5.1)
Sodium: 141 mEq/L (ref 135–145)
Sodium: 141 mEq/L (ref 135–145)
Sodium: 143 mEq/L (ref 135–145)

## 2010-10-01 LAB — LIPID PANEL
Cholesterol: 112 mg/dL (ref 0–200)
LDL Cholesterol: 58 mg/dL (ref 0–99)

## 2010-10-01 LAB — CARDIAC PANEL(CRET KIN+CKTOT+MB+TROPI)
Relative Index: INVALID (ref 0.0–2.5)
Relative Index: INVALID (ref 0.0–2.5)
Total CK: 34 U/L (ref 7–177)
Total CK: 36 U/L (ref 7–177)
Troponin I: 0.01 ng/mL (ref 0.00–0.06)
Troponin I: 0.02 ng/mL (ref 0.00–0.06)

## 2010-10-01 LAB — CBC
HCT: 34.6 % — ABNORMAL LOW (ref 36.0–46.0)
HCT: 34.7 % — ABNORMAL LOW (ref 36.0–46.0)
HCT: 35.5 % — ABNORMAL LOW (ref 36.0–46.0)
Hemoglobin: 11.6 g/dL — ABNORMAL LOW (ref 12.0–15.0)
MCHC: 32.9 g/dL (ref 30.0–36.0)
Platelets: 273 10*3/uL (ref 150–400)
RBC: 3.78 MIL/uL — ABNORMAL LOW (ref 3.87–5.11)
RDW: 13.7 % (ref 11.5–15.5)
RDW: 13.8 % (ref 11.5–15.5)
RDW: 14.1 % (ref 11.5–15.5)

## 2010-10-01 LAB — MAGNESIUM: Magnesium: 2 mg/dL (ref 1.5–2.5)

## 2010-10-01 LAB — DIFFERENTIAL
Basophils Absolute: 0.1 10*3/uL (ref 0.0–0.1)
Lymphocytes Relative: 38 % (ref 12–46)
Neutro Abs: 3.3 10*3/uL (ref 1.7–7.7)

## 2010-10-01 LAB — CK TOTAL AND CKMB (NOT AT ARMC)
CK, MB: 0.5 ng/mL (ref 0.3–4.0)
Relative Index: INVALID (ref 0.0–2.5)

## 2010-10-01 LAB — POCT I-STAT, CHEM 8
Calcium, Ion: 1.04 mmol/L — ABNORMAL LOW (ref 1.12–1.32)
Chloride: 108 mEq/L (ref 96–112)
HCT: 41 % (ref 36.0–46.0)
Potassium: 4 mEq/L (ref 3.5–5.1)
Sodium: 137 mEq/L (ref 135–145)

## 2010-10-01 LAB — TROPONIN I: Troponin I: 0.01 ng/mL (ref 0.00–0.06)

## 2010-10-01 LAB — BRAIN NATRIURETIC PEPTIDE: Pro B Natriuretic peptide (BNP): 38 pg/mL (ref 0.0–100.0)

## 2010-10-01 LAB — POCT CARDIAC MARKERS
Myoglobin, poc: 53.3 ng/mL (ref 12–200)
Troponin i, poc: <0.05 ng/mL (ref 0.00–0.09)

## 2010-10-01 LAB — TSH: TSH: 1.343 u[IU]/mL (ref 0.350–4.500)

## 2010-10-01 LAB — PROTIME-INR: Prothrombin Time: 13.3 seconds (ref 11.6–15.2)

## 2010-10-01 LAB — HEMOGLOBIN A1C: Hgb A1c MFr Bld: 5.7 % (ref 4.6–6.1)

## 2010-10-26 ENCOUNTER — Inpatient Hospital Stay (HOSPITAL_COMMUNITY)
Admission: EM | Admit: 2010-10-26 | Discharge: 2010-10-28 | DRG: 195 | Disposition: A | Discharge status: Home / Self Care | Payer: Medicare Other | Attending: Family Medicine | Admitting: Family Medicine

## 2010-10-26 ENCOUNTER — Emergency Department (HOSPITAL_COMMUNITY): Payer: Medicare Other

## 2010-10-26 DIAGNOSIS — Z7902 Long term (current) use of antithrombotics/antiplatelets: Secondary | ICD-10-CM

## 2010-10-26 DIAGNOSIS — K219 Gastro-esophageal reflux disease without esophagitis: Secondary | ICD-10-CM | POA: Diagnosis present

## 2010-10-26 DIAGNOSIS — E876 Hypokalemia: Secondary | ICD-10-CM | POA: Diagnosis present

## 2010-10-26 DIAGNOSIS — E785 Hyperlipidemia, unspecified: Secondary | ICD-10-CM | POA: Diagnosis present

## 2010-10-26 DIAGNOSIS — M129 Arthropathy, unspecified: Secondary | ICD-10-CM | POA: Diagnosis present

## 2010-10-26 DIAGNOSIS — F17200 Nicotine dependence, unspecified, uncomplicated: Secondary | ICD-10-CM | POA: Diagnosis present

## 2010-10-26 DIAGNOSIS — R634 Abnormal weight loss: Secondary | ICD-10-CM | POA: Diagnosis present

## 2010-10-26 DIAGNOSIS — Z79899 Other long term (current) drug therapy: Secondary | ICD-10-CM

## 2010-10-26 DIAGNOSIS — J189 Pneumonia, unspecified organism: Principal | ICD-10-CM | POA: Diagnosis present

## 2010-10-26 DIAGNOSIS — I252 Old myocardial infarction: Secondary | ICD-10-CM

## 2010-10-26 DIAGNOSIS — I2510 Atherosclerotic heart disease of native coronary artery without angina pectoris: Secondary | ICD-10-CM | POA: Diagnosis present

## 2010-10-26 DIAGNOSIS — Z9861 Coronary angioplasty status: Secondary | ICD-10-CM

## 2010-10-26 DIAGNOSIS — J449 Chronic obstructive pulmonary disease, unspecified: Secondary | ICD-10-CM | POA: Diagnosis present

## 2010-10-26 DIAGNOSIS — J4489 Other specified chronic obstructive pulmonary disease: Secondary | ICD-10-CM | POA: Diagnosis present

## 2010-10-26 DIAGNOSIS — IMO0002 Reserved for concepts with insufficient information to code with codable children: Secondary | ICD-10-CM | POA: Diagnosis present

## 2010-10-26 DIAGNOSIS — Z8673 Personal history of transient ischemic attack (TIA), and cerebral infarction without residual deficits: Secondary | ICD-10-CM

## 2010-10-26 DIAGNOSIS — I10 Essential (primary) hypertension: Secondary | ICD-10-CM | POA: Diagnosis present

## 2010-10-26 LAB — COMPREHENSIVE METABOLIC PANEL
AST: 23 U/L (ref 0–37)
Albumin: 3.4 g/dL — ABNORMAL LOW (ref 3.5–5.2)
BUN: 11 mg/dL (ref 6–23)
Calcium: 9.3 mg/dL (ref 8.4–10.5)
Chloride: 103 mEq/L (ref 96–112)
Creatinine, Ser: 0.55 mg/dL (ref 0.4–1.2)
GFR calc Af Amer: >60 mL/min (ref >60)
GFR calc non Af Amer: >60 mL/min (ref >60)
Total Bilirubin: 0.3 mg/dL (ref 0.3–1.2)

## 2010-10-26 LAB — URINALYSIS, ROUTINE W REFLEX MICROSCOPIC
Bilirubin Urine: NEGATIVE
Nitrite: NEGATIVE
Specific Gravity, Urine: 1.018 (ref 1.005–1.030)
Urobilinogen, UA: 0.2 mg/dL (ref 0.0–1.0)
pH: 5.5 (ref 5.0–8.0)

## 2010-10-26 LAB — DIFFERENTIAL
Basophils Absolute: 0 10*3/uL (ref 0.0–0.1)
Basophils Relative: 0 % (ref 0–1)
Lymphocytes Relative: 9 % — ABNORMAL LOW (ref 12–46)
Neutro Abs: 7.7 10*3/uL (ref 1.7–7.7)
Neutrophils Relative %: 83 % — ABNORMAL HIGH (ref 43–77)

## 2010-10-26 LAB — TROPONIN I: Troponin I: <0.3 ng/mL (ref <0.30)

## 2010-10-26 LAB — CK TOTAL AND CKMB (NOT AT ARMC)
CK, MB: 3 ng/mL (ref 0.3–4.0)
Relative Index: 1 (ref 0.0–2.5)

## 2010-10-26 LAB — CBC
HCT: 37.8 % (ref 36.0–46.0)
Hemoglobin: 11.9 g/dL — ABNORMAL LOW (ref 12.0–15.0)
RBC: 4.11 MIL/uL (ref 3.87–5.11)
WBC: 9.4 10*3/uL (ref 4.0–10.5)

## 2010-10-26 LAB — LACTIC ACID, PLASMA: Lactic Acid, Venous: 1 mmol/L (ref 0.5–2.2)

## 2010-10-26 LAB — POCT CARDIAC MARKERS
CKMB, poc: 1.6 ng/mL (ref 1.0–8.0)
Myoglobin, poc: 198 ng/mL (ref 12–200)

## 2010-10-27 DIAGNOSIS — J449 Chronic obstructive pulmonary disease, unspecified: Secondary | ICD-10-CM

## 2010-10-27 DIAGNOSIS — J159 Unspecified bacterial pneumonia: Secondary | ICD-10-CM

## 2010-10-27 LAB — BASIC METABOLIC PANEL
BUN: 10 mg/dL (ref 6–23)
CO2: 27 mEq/L (ref 19–32)
Calcium: 9 mg/dL (ref 8.4–10.5)
Chloride: 104 mEq/L (ref 96–112)
Creatinine, Ser: <0.47 mg/dL (ref 0.4–1.2)

## 2010-10-27 LAB — CBC
Hemoglobin: 10.9 g/dL — ABNORMAL LOW (ref 12.0–15.0)
MCH: 29.4 pg (ref 26.0–34.0)
MCHC: 32.2 g/dL (ref 30.0–36.0)
Platelets: 197 10*3/uL (ref 150–400)
RBC: 3.71 MIL/uL — ABNORMAL LOW (ref 3.87–5.11)

## 2010-10-27 LAB — CARDIAC PANEL(CRET KIN+CKTOT+MB+TROPI)
Total CK: 266 U/L — ABNORMAL HIGH (ref 7–177)
Troponin I: <0.3 ng/mL (ref <0.30)

## 2010-10-28 LAB — URINE CULTURE
Colony Count: >=100000
Culture  Setup Time: 201205011805

## 2010-10-28 LAB — CBC
MCHC: 32.5 g/dL (ref 30.0–36.0)
MCV: 89.8 fL (ref 78.0–100.0)
Platelets: 222 10*3/uL (ref 150–400)
RDW: 14.8 % (ref 11.5–15.5)
WBC: 7 10*3/uL (ref 4.0–10.5)

## 2010-10-28 LAB — BASIC METABOLIC PANEL
BUN: 8 mg/dL (ref 6–23)
Calcium: 9.1 mg/dL (ref 8.4–10.5)
Potassium: 2.9 mEq/L — ABNORMAL LOW (ref 3.5–5.1)

## 2010-11-01 LAB — CULTURE, BLOOD (ROUTINE X 2): Culture  Setup Time: 201205012327

## 2010-11-09 NOTE — Discharge Summary (Signed)
NAMEMarland Kitchen  NIASHA, DEVINS NO.:  [id number]   MEDICAL RECORD NO.:  [id number]          PATIENT TYPE:  INP   LOCATION:  2029                         FACILITY:  [facility]   PHYSICIAN:  Nicki Guadalajara, M.D.     DATE OF BIRTH:  [birth year]   DATE OF ADMISSION:  08/30/2006  DATE OF DISCHARGE:  09/01/2006                               DISCHARGE SUMMARY   Kathy Marshall is a 76 year old female with a previous history of  coronary disease.  She has had a RCA stenting in 2006 with a Cypher  stent.  She had a Myoview test in July 2007 which showed no ischemia and  EF of 80%.  She apparently called our office with chest pain, and she  was referred to [facility] emergency room.  She was admitted, ruled out  for an MI.  She was put on IV heparin.  She apparently continued to have  some pain on and off.  Thus, it was decided she should undergo cardiac  catheterization which was performed on September 01, 2006 by Dr. Yates Decamp.  Her Cypher stent which was 3.5 x 28 and her RCA was patent.  She only  had mild disease in her LAD.  She had widely patent renals and aortic  iliac.  Thus, it was decided she could be discharged home later that  afternoon.   LABORATORY DATA:  Hemoglobin 11.7, hematocrit 34.5, WBC 7.3, platelets  were 258.  Her sodium was 143, potassium was 3.4, her chloride was 110,  glucose was 108, BUN was 16, creatinine was 0.85.  AST was 22, ALT was  12.  CK-MB and troponins were negative x3.  TSH was 3.027.  Chest x-ray  showed no acute findings, mild cardiac enlargement.   DISCHARGE MEDICATIONS:  1. Protonix 40 mg every day.  2. Neurontin 300 mg every day.  3. Imdur 60 mg a day.  4. Lasix 20 mg every day.  5. Plavix 75 mg daily.  6. Pletal 100 mg twice daily.  7. Norvasc 10 mg daily.  8. Aspirin 81 mg daily.  9. Lipitor 80 mg daily.  10.Toprol XL 25 mg daily.  11.Combivent as used previously.  12.Advair 100/50 one puff twice daily.  13.Mucinex DM twice a day.   DISCHARGE DIAGNOSES:  1. Chest pain, not coronary ischemic related, possible      gastroesophageal reflux disease or musculoskeletal pain.  2. Known coronary disease with catheterization this admission with      patent right coronary artery stent placed in 2006.  She only has      mild disease in her left anterior descending  artery.  3. Pulmonary hypertension by 2-D echocardiogram with RVST of 40-50      mmHg.  4. Hypertension.  5. Dyslipidemia.      Kathy Marshall, N.P.    ______________________________  Nicki Guadalajara, M.D.    BB/MEDQ  D:  11/01/2006  T:  11/02/2006  Job:  [id number]   cc:   Dala Dock

## 2010-11-12 NOTE — H&P (Signed)
[place]  Patient:    Kathy Marshall, Kathy Marshall Visit Number: [id number] MRN: [id number]          Service Type: MED Location: [id number] Attending Physician:  Hinda Glatter Dictated by:   Jackie Plum, M.D. Admit Date:  08/28/2001   CC:         Verlon Setting, M.D., [facility]   History and Physical  DATE OF BIRTH:  [birth year]  REASON FOR ADMISSION:  Transient altered mental status -- resolved, rule out seizure.  OTHER MEDICAL PROBLEMS: 1. History of coronary artery disease, status post angioplasty. 2. History of chronic obstructive pulmonary disease. 3. History of cigarette smoking. 4. History of hypertension. 5. History of hyperlipidemia. 6. History of peripheral vascular disease, with 50% left renal artery stenosis    and 40-50% right iliac stenosis. 7. History of questionable cerebrovascular accident versus mini-strokes. 8. History of anxiety disorder.  CHIEF COMPLAINT:  Altered mental status.  HISTORY OF PRESENT ILLNESS:  Kathy Marshall is a 76 year old lady with history of coronary artery disease, cigarette smoking, hyperlipidemia and hypertension, who presented with transient altered mental status this evening lasting about 10 minutes in duration.  She was in her usual state of health and was at dinner with spouse this evening when she was noted to be confused -- talking about someone behind her and bothering her -- with slow movements of a spoon from her food to her mouth.  She was also noted to have an unsteady gait when she tried to get up.  She was noted to be tremulous.  Patient is currently back to her normal baseline mental status at time of exam and admitted to severe headaches and general weakness as well as dizziness, without any vertiginous signatures, which have resolved at the time of my evaluation.  No history of fever, chest pain, palpitations, shortness of breath, cough, sputum production.   She does not remember what her pain was briefly during the above episode and denies any recent new medication, intake or discontinuance of any of her old medicines.  She gives history of forgetfulness and mini-strokes.  PAST MEDICAL HISTORY:  As enumerated in the problem list above.  ALLERGIES:  No known history of drug allergy.  CURRENT MEDICATIONS:  Prevacid, Lasix, nitroglycerin sublingual and Toprol, isosorbide and Wellbutrin.  FAMILY HISTORY:  Nil of significance.  SOCIAL HISTORY:  She is a retired Museum/gallery curator, currently unemployed, lives with her spouse, smokes one pack of cigarettes per day for many years and drinks alcohol occasionally.  No history of illicit drug use.  REVIEW OF SYSTEMS:  Review of systems is clearly enumerated in the history of present illness above, otherwise, review of systems is unremarkable.  PHYSICAL EXAMINATION:  VITAL SIGNS:  BP was 95/62, pulse rate of 58, respiratory rate of 20 per minute and temperature was 97.8 degrees Fahrenheit, O2 saturation of 95% on room air.  GENERAL:  Middle-aged lady who looks quite a bit older than her stated age. She was lying on the couch.  She was comfortable-looking, not in acute cardiopulmonary or painful distress.  HEENT:  She had normocephalic, atraumatic.  She was not pale.  She was not icteric.  Pupils were equally round and reactive to light.  Extraocular movements were intact.  Oropharynx exam was notable for pink and moist oropharynx without any exudation.  On funduscopic exam, I could not evaluate her fundi.  TMs were within normal limits.  NECK:  Neck was supple.  No  JVD.  No bruits were appreciated.  She did not have any appreciable thyromegaly.  LUNGS:  She did not have any rales or wheezes.  She has vesicular breath sounds which were adequate.  HEART:  She had a regular rate and rhythm.  No gallops appreciated.  She had a 2-3/6 apical systolic murmur which was nonradiating.  ABDOMEN:  Abdomen  was full.  She had normoactive bowel sounds.  She did not have any tenderness.  No organomegaly was appreciated.  EXTREMITIES:  She was not cyanosed.  No clubbing.  No pedal edema.  Dorsalis pedis pulses were present.  CNS:  The patient was alert and oriented x3.  Cranial nerves II-XII were grossly intact.  There was no obvious focal deficit.  LABORATORY AND ACCESSORY DATA:  WBC count was 7.9, hemoglobin 12.8, hematocrit 36.8, MCV 87.9, platelet count 227,000.  Sodium 139, potassium 3.5, chloride 107, CO2 27, glucose 115, BUN 15, creatinine 0.8, calcium 9.2.  Toxicology screen was negative for acetaminophens, barbiturates, benzodiazepines, cocaine, opiates and tetrahydrocannabinoids.  Urinalysis was notable for yellow urine, which was clear, specific gravity of 1.017, pH of 5.5, glucose negative, bilirubin negative, ketones negative and blood negative, protein negative, urobilinogen 0.2, nitrite negative, leukocytes negative.  Head CT was reviewed by Dr. Deanna Artis. Hickling, which was also negative for acute intracranial process.  Twelve-lead EKG was notable for sinus bradycardia at 55 beats per minute, no acute ST-T wave changes were appreciated.  IMPRESSION:  Transient altered mental status.  It appears etiology of this is unclear.  The patient does not seem to have any obvious toxic metabolic etiology.  The alteration in mentation was very brief, about 30 minutes.  We cannot rule out seizures and we are also worried about the possibility of transient ischemic attack, though transient ischemic attack is very likely, based on the history.  PLAN:  The plan is to admit the patient for observation overnight.  We will start her on aspirin and continue her other medications.  She will also get an EEG in the morning.  Dr. Sharene Skeans -- neurologist -- was consulted and agrees with plan.  We will obtain TSH but will not pursue any other toxic metabolic workup, since patient is currently  back in her baseline mental status and this  was a brief episode, with the exception of EEG as above. Dictated by:   Jackie Plum, M.D. Attending Physician:  Hinda Glatter DD:  08/29/01 TD:  08/29/01 Job: [id number]

## 2010-11-12 NOTE — Discharge Summary (Signed)
[place]  Patient:    Kathy Marshall, Kathy Marshall                 MRN: [id number] Adm. Date:  [id number] Disc. Date: [id number] Attending:  Virgina Evener Dictator:   Mancel Bale, P.A. CC:         Clinton D. Maple Hudson, M.D.             [facility]                           Discharge Summary  ADMISSION DIAGNOSES: 1. Chest pain with questionable unstable angina versus bronchitis as etiology. 2. History of coronary artery disease.    a. The last catheterization was in May of 2000.  Ejection fraction 60% at       that time. 3. Hypertension. 4. Hyperlipidemia. 5. Ongoing tobacco use. 6. Chronic obstructive pulmonary disease. 7. Peripheral vascular disease with non-obstructive bilateral renal artery    stenosis. 8. Status post echocardiogram on Nov 13, 1999, with mild tricuspid    regurgitation and mild mitral regurgitation.  DISCHARGE DIAGNOSES: 1. Chest pain with questionable unstable angina versus bronchitis as etiology. 2. History of coronary artery disease.    a. The last catheterization was in May of 2000.  Ejection fraction 60% at       that time.    b. Status post cardiac catheterization on December 07, 1999, by Richard A.       Alanda Amass, M.D., with non-obstructive coronary artery disease for       medical therapy and ejection fraction greater than 55%. 3. Hypertension. 4. Hyperlipidemia. 5. Ongoing tobacco use. 6. Chronic obstructive pulmonary disease. 7. Peripheral vascular disease with non-obstructive bilateral renal artery    stenosis. 8. Status post echocardiogram on Nov 13, 1999, with mild tricuspid    regurgitation and mild mitral regurgitation.  HISTORY OF PRESENT ILLNESS:  Kathy Marshall is a 76 year old white female with a history of CAD, hypertension, hyperlipidemia, and tobacco abuse, who presented to the [facility] ER on December 06, 1999, with complaints of chest heaviness.  She states that on that date  at approximately 1 p.m. while was doing minimal activity (i.e. riding in a car with her daughter) when she had the onset of substernal chest heaviness.  As well, she had left arm heaviness.  There was no radiation to the neck.  Additionally, on that day she had increased shortness of breath.  Her inhaler did not help.  As well, she had had diaphoresis during the day, but no emesis.  These symptoms lasted for one to two hours.  She then went home and laid down, but the symptoms did not improve at that time.  She did not feel any better and actually had an increased amount of cough when she laid down.  She did not take any nitroglycerin.  She then called our office and came to the emergency room.  In the ER at that time, she was still complaining of chest heaviness, left arm heaviness, shortness of breath, and nausea.  She states that this was different than the symptoms she had with her bronchitis which she had had just about two weeks ago.  On exam at that time, her blood pressure was 140/70 and pulse 80.  There were no EKG changes.  The exam was essentially benign. Enzymes were pending.  We planned to treat with IV heparin, IV nitroglycerin, nebulizers, proton  pump inhibitor, and antibiotics.  We planned for cardiac catheterization the following day to rule out progressive CAD.  HOSPITAL COURSE:  On December 07, 1999, Kathy Marshall underwent cardiac catheterization by Richard A. Alanda Amass, M.D.  Please see his dictated catheterization report for further details.  She was found to have non-obstructive CAD.  EF greater than 55%.  We planned for medical therapy.  On the morning of December 08, 1999, Kathy Marshall has no complaints.  She is afebrile, has a pulse of 60, blood pressure 118/62, and oxygen saturation 90% on room air.  Her lipid profile is extremely elevated and we are changing her statin therapy.  Her right groin is stable with no ecchymosis, no hematoma, and no bruit.  We feel that  she should be stable for discharge home at this time with proper arrangements with pulmonary follow-up, cardiac rehabilitation follow-up, and smoking cessation with Wellbutrin.  CONSULTS:  None.  PROCEDURES:  Cardiac catheterization on December 07, 1999, by Richard A. Alanda Amass, M.D., revealing non-obstructive coronary artery disease for medical therapy.  LABORATORY DATA:  The lipid profile revealed a cholesterol of 310, triglycerides 261, HDL 41, and LDL 217.  On December 08, 1999, her CBC revealed WBC 7.2, hemoglobin 11.6, hematocrit 32.6, and platelets 232.  Sodium 138, potassium 138, potassium 3.1, BUN 15, creatinine 1.0, glucose 108.  Her potassium is being repleted prior to her discharge home.  Cardiac enzymes revealed CKs of 92 and 151, CK-MBs 0.5 and 2.1, relative index 0.5 and 4.1, and troponin Is less than 0.03 and 0.04.  The chest x-ray revealed mild bibasilar linear atelectasis.  The EKG revealed normal sinus rhythm, 73 beats per minute, with nonspecific ST changes.  DISCHARGE MEDICATIONS:  1. Wellbutrin 150 mg one p.o. q.d. x 5 days and then one p.o. b.i.d.  2. Enteric-coated aspirin 325 mg one p.o. q.d.  3. Stop Zocor.  4. Lasix 20 mg one p.o. q.d.  5. Prevacid 15 mg one b.i.d.  6. Norvasc 10 mg one q.d.  7. Prinivil 20 mg one q.d.  8. Toprol XL 50 mg one q.d.  9. Combivent inhaler two puffs q.i.d. 10. Amitriptyline HCL 50 mg two q.h.s. 11. Lipitor 80 mg one q.d. 12. Levaquin 500 mg one q.d. for five days. 13. Nitroglycerin 0.4 mg sublingual as directed.  ACTIVITY:  No strenuous activity, lifting greater than 5 pounds, driving, or sexual activity for three days.  DIET:  Low-salt, low-fat, low-cholesterol diet.  WOUND CARE:  May gently wash her groin with warm water and soap.  SPECIAL INSTRUCTIONS:  Have blood drawn in one week to check a BMP.  FOLLOW-UP:  Follow up with Clinton D. Maple Hudson, M.D., for pulmonary on March 09, 2000, at 9:20 a.m.  Follow up with  Lennette Bihari, M.D., on January 13, 2000, at 4:25 p.m. DD:  12/08/99  TD:  12/10/99 Job: [id number]

## 2010-11-12 NOTE — Cardiovascular Report (Signed)
[place]. [facility]  Patient:    Kathy Marshall, Kathy Marshall                 MRN: [id number] Proc. Date: 12/07/99 Adm. Date:  [id number] Disc. Date: [id number] Attending:  Virgina Evener CC:         Richard A. Alanda Amass, M.D.             Cardiopulmonary Lab             [facility]             Lennette Bihari, M.D.                        Cardiac Catheterization  PROCEDURE: 1. Retrograde central aortic catheterization. 2. Selective coronary angiography, by Judkins technique. 3. Left ventriculogram, RAO and LAO projection. 4. Subselective LIMA subclavian injection/RCA and main left    injection. 5. Abdominal aortogram.  DESCRIPTION OF PROCEDURE:  The procedure was done with 6-French 4 cm taper, preformed Cordis coronary and pigtail catheters; using Hexabrix dye throughout the procedure through a 6-French short Daig sidearm sheath placed through a single anterior puncture.  Xylocaine 1% was used for anesthesia, with 2 mg Nubain and 5 mg Valium  for premedication in the postabsorptive state.  The modified Seldinger technique was used, with a single anterior puncture. Following the procedure, a sidearm sheath was flushed.  The patient was brought to the holding area.  ACT was pending, and we planned sheath removal and pressure hemostasis.  She tolerated the procedure well.  HEMODYNAMICS:  There was no gradient across the aortic valve on catheter pullback.  LEFT VENTRICULOGRAM: Done in the RAO and LAO projection, showed normally contracting ventricle with no segmental wall motion abnormality.  EF was greater than 55%.  There is mild mitral valve prolapse, but no mitral regurgitation.  The LIMA was widely patent.  There were irregularities from the subclavian, but this was patent without stenosis or gradient.  The left vertebral was antegrade.  ABDOMINAL AORTOGRAM:  Done in the midstream PA projection at 30 cc and 20 cc/sec, showed 30% narrowing of the  left renal artery (which was single) and near-normal single right renal artery.  There was 40% narrowing of the right common iliac artery proximally, with no gradient.  This is essentially unchanged from prior angiography.  There was mild infrarenal atherosclerotic disease.  PRESSURES: 1. Left ventricular pressure:  165/0. 2. Left ventricular end-diastolic pressure:  14-16 mmHg. 3. Central aortic pressure:  165/85 mmHg. No gradient across the aortic valve on catheter pullback.  FLUOROSCOPY:  Revealed 1+ calcification of the proximal LAD and right coronary artery.  There was no arch calcification or calcium near the right coronary ostium visualized fluoroscopy.  CORONARY ANGIOGRAPHY: 1. LEFT MAIN:  Flush injection of the main left showed no significant    stenosis, and probably 20% narrowing of the proximal left main.  This was    smooth and unchanged.  The remainder of the left main had no significant    abnormality. 2. CIRCUMFLEX:  A nondominant vessel, which was mainly comprised of a small    marginal vessel that bifurcated, and a small PAVG vessel. 3. LEFT ANTERIOR DESCENDING ARTERY:  A moderate-sized vessel.  With 30% narrowing    at the large first diagonal branch, 40% smooth narrowing before the second    diagonal at the junction of the proximal mid third of the LAD.  The    distal  third of the LAD gave off another diagonal, which bifurcated.  The    distal LAD had 40-50% narrowing beyond the third diagonal branch.  There    was good flow throughout the LAD, with no high-grade stenosis and multiple    septal perforator branches.  The first diagonal had 40% narrowing    proximally; the second diagonal branch bifurcated and had no significant    stenosis. 5. RIGHT CORONARY ARTERY:  A dominant vessel.  There appeared to be 30-40%    ostial narrowing, but there may have been catheter spasm related to this.    There was no significant narrowing on flush injection. The ostium of  the    right coronary artery was somewhat out of plane, but easily visualized on    flush injections and selectively.  There was 40% smooth narrowing of the    mid RCA after the RV branch and before the acute margin.  The PDA and PLA    were well visualized and had no significant stenoses, although there    appeared to be some atheroma at the origin of the PLA branch (without    any flow reduction).  IMPRESSION: This 76 year old white, divorced lady (1972), with six children and 11 grandchildren.  She still smokes 1/2  to a pack of cigarettes a day. She has COPD, chronic bronchitis and chest pain syndrome.  She has had multiple cardiac catheterization procedures, particularly over the last year; including April 1999, January 2000, May 2000, October 2000 and then today. There is no significant change angiographically.  She had noncritical coronary disease of the left coronary system.  She does, however, have about 50-60% narrowing at the very distal third of the LAD beyond the third diagonal branch (which is a small vessel).  She also has some narrowing of the right coronary ostial, but it fills on flush and there is no significant stenosis demonstrated on this study.  She came in with chest pain, but has significant exacerbation of her bronchitis (which may be accounting for this).  RECOMMENDATIONS:  I recommend reassurance for this patient at present. Continued medical therapy of her noncritical coronary disease; strong attempts at amelioration of significant risk factors of smoking.  A possibility of cardiac rehabilitation program, and continued treatment of her systemic hypertension, COPD, and assessment of her lipid status.  CATHETERIZATION DIAGNOSES:  1. Chest pain, etiology not determined.  2. Bronchitis, acute exacerbation, with associated asthmatic bronchitis.  3. Cigarette abuse.  4. Noncritical coronary disease, as outlined above.  5. Normal LV function.  6. Probable  GERD.   7. Systemic hypertension, 40% left renal artery ______.  8. Peripheral vascular disease, noncritical right common iliac disease.  9. Chronic butylbarbitol and APC  abuse. 10. Anxiety disorder. DD:  12/07/99 TD:  12/10/99 Job: [id number]

## 2010-11-12 NOTE — Op Note (Signed)
NAME:  Kathy Marshall, Kathy Marshall                    ACCOUNT NO.:  [id number]   MEDICAL RECORD NO.:  [id number]                   PATIENT TYPE:  AMB   LOCATION:  ENDO                                 FACILITY:  [facility]   PHYSICIAN:  Clinton D. Maple Hudson, M.D.              DATE OF BIRTH:  08-24-1934   DATE OF PROCEDURE:  09/04/2002  DATE OF DISCHARGE:                                 OPERATIVE REPORT   PROCEDURE:  Bronchoscopy.   INDICATION FOR PROCEDURE:  A 76 year old woman with a very long smoking  history, severe COPD, being evaluated for recent onset of streak hemoptysis  with no specific source identified on chest x-ray.  CT scan has been  followed for some very small lung nodules of uncertain significance, which  have been stable.   PREOPERATIVE EXAMINATION:  VITAL SIGNS:  BP 130/70, 169 pounds, room air  saturation 95%.  CHEST:  Significant for mild cough without dullness or labored breath.  Normal heart sounds.  ADENOPATHY:  None.  EDEMA:  None.   MEDICATIONS:  Advair 100/50, albuterol inhaler, furosemide, aspirin 81 mg  (which was held), Norvasc, Lipitor, Imdur, Prinivil, Toprol XL.   ALLERGIES:  No medication allergies.   DESCRIPTION OF PROCEDURE:  After fully informed consent, bronchoscopy was  performed in the endoscopy suite on an outpatient basis.  Premedication was  with Demerol and atropine.  Oxygen was provided at 8 L/min. by nasal prongs,  holding saturations over 97%.  Cardiac monitor showed sinus rhythm.  The  upper airway was anesthetized topically with Cetacaine spray and with 1%  Xylocaine.  A cumulative dose of 5 mg of intravenous Versed was required for  additional sedation and cough control.  An Olympus fiberoptic bronchoscope  was advanced via the right nostril to the level of the vocal cords without  difficulty.  Cough was moderate.  The cords moved normally.  The trachea and  main carina were unremarkable.  Sequential examination of each lobar and  segmental  airway bilaterally to the fourth division level revealed no  endobronchial lesions.  Secretions were clear.  There was no evident  bleeding source and no free blood.  The mucosa was friable and contact  ecchymoses developed very readily as she coughed against the bronchoscope.  Gentle brushing was performed in the right middle lobe and left lower lobe  with no complications.  Specimens were collected for cytology and for  cultures.  She will be held until stable and then released home with family  to office follow-up.   FINAL IMPRESSION:  Probably hemorrhagic bronchitis with no specific source  of hemoptysis seen.                                               Clinton D. Maple Hudson, M.D.    CDY/MEDQ  D:  09/04/2002  T:  09/04/2002  Job:  [id number]   cc:   [facility]

## 2010-11-12 NOTE — Cardiovascular Report (Signed)
NAMEMarland Marshall  ANJELITA, SHEAHAN NO.:  [id number]   MEDICAL RECORD NO.:  [id number]          PATIENT TYPE:  INP   LOCATION:  2029                         FACILITY:  [facility]   PHYSICIAN:  Cristy Hilts. Jacinto Halim, MD       DATE OF BIRTH:  [birth year]   DATE OF PROCEDURE:  09/01/2006  DATE OF DISCHARGE:                            CARDIAC CATHETERIZATION   PROCEDURE PERFORMED:  Left heart catheterization including:  1. Left ventriculography.  2. Selective right left coronary arteriography.  3. Abdominal aortogram.   INDICATIONS:  This is a 76 year old female with prior history of  smoking, hypertension, hyperlipidemia who has had a Cypher stent  implantation to her proximal and mid right coronary artery on  10/04/2004.  The stent size was 3.5 x 28 mm.  She was readmitted to the  hospital with chest pain while she was shopping yesterday.  She was  ruled out for myocardial infarction and now she is brought to the  cardiac catheterization lab to evaluate her coronary anatomy for  progression of coronary disease.   Abdominal aortogram was performed to evaluate for abdominal stenosis and  abdominal aortic aneurysm and renal artery stenosis.   HEMODYNAMIC DATA:  The left ventricular pressure 123/2 with end  diastolic pressure of 7 mmHg.  The aortic pressure 125/56 with a mean of  85 mmHg.  There was no pressure gradient across the aortic valve.   ANGIOGRAPHIC DATA:  Left ventricle.  Left ventricular systolic function  was normal with ejection fraction of 65-70%.  The LV systolic function  was hyperdynamic.  There is no significant mitral regurgitation.   Right coronary artery.  The right coronary artery is large.  It is a  dominant vessel.  It appears to be super dominant.  It gives origin to  large PDA and large PLA branch.  The previously placed Cypher stent in  the proximal and mid segment of right femoral artery is widely patent.  There is mild luminal irregularity in the ostium of the  right coronary  artery.   Left main.  The left main is a moderate to large caliber vessel.  It is  smooth and normal.   LAD.  The LAD is a moderate to large caliber vessel.  It has mild  luminal irregularity.  It ends at the apex.  Gives origin to several  small diagonals.  It has a mild luminal irregularity.   Circumflex.  The circumflex is very small and has mild luminal  irregularity.   Abdominal aortogram.  Abdominal aortogram revealed presence of two renal  arteries, one on either side.  They are widely patent.  There is no  evidence of abdominal aortic The aortoiliac bifurcation was widely  patent.   RECOMMENDATIONS:  Evaluation for noncardiac cause of chest pain is  indicated.  No significant change in coronary anatomy on the left system  compared to prior cardiac catheterization.  Continued secondary  prevention strategy is indicated.  I have congratulated the patient for  having quit smoking.   TECHNIQUE OF THE PROCEDURE:  Under the usual sterile precautions using a  6-French multipurpose B2 catheter, the  catheter was advanced into the  ascending aorta over the J-wire.  The catheter was then advanced to the  left ventricle.  Left ventricular pressure monitored.  Hand contrast  injection of the left ventricle was performed both in LAO and RAO  projection.  Catheter was flushed with saline and pulled back into the  ascending aorta and pressure gradient across the aortic valve was  monitored.  Right coronary artery was selectively engaged.  Angiography  was performed.  Then the left main coronary artery selective angiography  was performed.  Then the catheter was pulled back into the abdominal  aorta and abdominal aortogram was performed.  Then the catheter was  pulled out of the body in the usual fashion.  Right femoral angiography  was performed through the arterial access sheath, however, because of  close proximity to bifurcation, it was decided not to close the  access  with closure device.  The patient tolerated the procedure.  No immediate  complication noted.  A total of 120 mL of contrast was utilized for  diagnostic angiography.      Cristy Hilts. Jacinto Halim, MD  Electronically Signed     JRG/MEDQ  D:  09/01/2006  T:  09/01/2006  Job:  [id number]   cc:   Nicki Guadalajara, M.D.
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
## 2010-11-12 NOTE — Cardiovascular Report (Signed)
NAMEMarland Kitchen  Kathy, Marshall NO.:  [id number]   MEDICAL RECORD NO.:  [id number]          PATIENT TYPE:  OIB   LOCATION:  2899                         FACILITY:  [facility]   PHYSICIAN:  Nicki Guadalajara, M.D.     DATE OF BIRTH:  1935-06-03   DATE OF PROCEDURE:  10/04/2004  DATE OF DISCHARGE:                              CARDIAC CATHETERIZATION   INDICATIONS:  [name] is a 76 year old female who had  previously been demonstrated to have mild coronary artery disease by initial  catheterization in 2001. The patient has ongoing tobacco use, history of  COPD, hypertension, hyperlipidemia, and recently had developed progressive  symptoms of chest pain suggesting increased angina, as well as, increased  claudication and lower extremity claudication symptomatology. Physical exam  also suggests peripheral vascular disease involving the carotid artery.   On September 28, 2004 she underwent outpatient diagnostic cardiac catheterization  at the [facility]. This showed hyperdynamic LV function without  focal segmental wall motion abnormalities. She had 2-vessel CAD with  calcification of the proximal LAD with 30-40% segmental narrowing in the  region between the first and second diagonal vessel, a small nondominant  left circumflex system, and a moderate calcification of the proximal right  coronary artery with 20-30% ostial stenosis followed by tandem 75-80%  proximal stenosis. She also was noted have a 70% left renal artery stenosis  as well as a common right iliac stenosis of 40%. The patient was started on  Plavix therapy and hydrated. She is now brought in, electively,  to undergo  percutaneous coronary intervention of her right coronary artery.   PROCEDURE:  After premedication with Valium 5 mg plus 2 mg intravenously for  total dose of 7 mg. The patient was prepped and draped in the usual fashion.  The right femoral artery was punctured anteriorly and a   6-French sheath was  inserted. A 6-French RD3 guide with side holes was used for the procedure  since this was found to be necessary at the time of the initial diagnostic  catheterization. The patient received an additional 150 mg of Plavix, and  she had been on Plavix for a week prior to the intervention. She also  received 4000 units of heparin and double bolus Integrilin for  anticoagulation. A Forte wire was advanced down the right coronary artery  which was helpful for measurement of the tandem stenoses lesion length. A  primary stenting was then done utilizing a 3.5 x 28 mm drug-eluting Cypher  stent which was successfully deployed up to a maximum of 16 atmospheres.  Poststent dilatation was done utilizing a 3.75 x 15 mm Quantum balloon with  dilatation up to 3.78 mm. Scout angiography confirmed an excellent  angiographic result. There was TIMI 3 flow. There was no evidence for  dissection.   HEMODYNAMIC DATA:  Central aortic pressure was 132/95.During procedure, the  patient did receive several doses of intracoronary nitroglycerin.   ANGIOGRAPHIC DATA:  At the start of the intervention, there was ostial 20-  30% right coronary artery narrowing with slightly upward takeoff as an off  angle. This was followed by tandem,  somewhat calcified 70-80-90% proximal  RCA stenosis. There was mild 10-20% narrowing in the mid-RCA and 20-30%  narrowing in the mid-PDA. The right coronary was a large dominant vessel.  Following successful primary stenting with a 35 x 28 mm Cypher stent  postdilated to 3.78 mm, the stenoses were reduced to 0%. There was TIMI 3  flow. There was no evidence for dissection. There is no change in the  previously noted stenoses.   IMPRESSION:  Successful primary stenting of tandem stenoses in the calcified  proximal right coronary artery of 70-80-90% which improved to 0% following  stenting with a 3.5 x 28-mm drug-eluting Cypher stent post dilated to 3.78  mm. Next  double bolus Integrilin/weight adjusted heparinization/oral Plavix  and aspirin.      TK/MEDQ  D:  10/04/2004  T:  10/04/2004  Job:  [id number]   cc:   Nicki Guadalajara, M.D.  [phone]  [place], [place] [address]  Fax: [phone]   [facility]   Orville Govern

## 2010-11-12 NOTE — Discharge Summary (Signed)
NAMEMarland Kitchen  EARLEEN, AOUN NO.:  [id number]   MEDICAL RECORD NO.:  [id number]          PATIENT TYPE:  INP   LOCATION:  2009                         FACILITY:  [facility]   PHYSICIAN:  Sherin Quarry, MD      DATE OF BIRTH:  1935-03-11   DATE OF ADMISSION:  09/08/2005  DATE OF DISCHARGE:  09/11/2005                                 DISCHARGE SUMMARY   Kathy Marshall is a 76 year old lady with a past history of coronary  artery disease status post PTCA, COPD, hypertension, hyperlipidemia,  peripheral vascular disease and chronic tobacco abuse. The patient presented  to [facility] Emergency Room on September 08, 2005 with a one-day  history of rectal bleeding. During that period of time, she also experienced  intermittent abdominal cramping. Rectal exam in the emergency room confirmed  the presence of rectal bleeding. The patient was noted to have a blood  pressure 110/69 and her hemoglobin was 12.6. She was therefore admitted to  the hospital for further evaluation of rectal bleeding.   Physical exam at time of admission as described by Dr. Donnalee Curry: The  temperature was 99, blood pressure 110/69, pulse 66, respirations 20, O2  saturation 99%. HEENT exam is within normal limits. The chest was remarkable  for coarse breath sounds which were slightly diminished. Cardiovascular exam  revealed normal S1-S2. There were no rubs, murmurs or gallops. The abdomen  was soft. Bowel sounds were present. There was very mild lower quadrant  abdominal tenderness without guarding or rebound. Neurologic testing was  within normal limits. Examination of the extremities was within normal  limits.   INR was 0.9. Sodium 138, potassium 4.4, glucose was 109, creatinine 1, BUN  26. Liver functions were normal. On admission, the patient's Plavix therapy  was discontinued and she was continued on her other medications.  Consultation was obtained from Dr. Christella Hartigan of [facility] and  plan was made to proceed with upper endoscopy and colon examination. Upper  endoscopy showed evidence of esophagitis apparently secondary to Candida.  Colonoscopy was not ideally prepped but no bleeding lesions could be  identified and the patient was noted to have moderate diverticulosis. It was  assumed by Dr. Juanda Chance that the source of the patient's lower  gastrointestinal bleeding was diverticulosis. No further bleeding occurred.  On September 11, 2005, the patient was advanced to a regular diet which she  seemed to tolerate well. Her hemoglobin was very closely monitored in the  hospital and was in the range of 10-11 throughout the hospitalization.   DISCHARGE DIAGNOSES:  1.  Fungal esophagitis.  2.  Lower gastrointestinal bleeding presumably secondary to diverticulosis.  3.  Hypertension.  4.  Coronary disease and status post percutaneous transluminal coronary      angioplasty.  5.  Peripheral vascular disease.  6.  Chronic obstructive pulmonary disease.  7.  Hyperlipidemia.   At the time of discharge, the patient was advised to stop Plavix for a  period of 10 days. She was also advised to continue Prevacid, Lasix 20  milligrams daily, nitroglycerin p.r.n., Lipitor 80 milligrams at bedtime  daily, Toprol  50 milligrams daily, Neurontin 300 milligrams at bedtime  daily, Isordil 120 milligrams daily, Norvasc 10 milligrams daily and Pletal  100 milligrams twice daily.   She was advised to follow up with [facility] in two to three days. She was  also advised to take Diflucan 100 milligrams daily for three days to take  iron 325 milligrams b.i.d. and to take Metamucil one dose daily. When she  returns [facility], it would be prudent to obtain a follow-up  hemoglobin/hematocrit.           ______________________________  Sherin Quarry, MD     SY/MEDQ  D:  09/11/2005  T:  09/12/2005  Job:  [id number]   cc:   [name], M.D. [facility]  [address]   [place]  [place] [address]   [facility]

## 2010-11-12 NOTE — H&P (Signed)
NAMEDEARDRA, HINKLEY NO.:  [id number]   MEDICAL RECORD NO.:  [id number]          PATIENT TYPE:  INP   LOCATION:  2009                         FACILITY:  [facility]   PHYSICIAN:  Kela Millin, M.D.DATE OF BIRTH:  Sep 11, 1934   DATE OF ADMISSION:  09/08/2005  DATE OF DISCHARGE:                                HISTORY & PHYSICAL   PRIMARY CARE PHYSICIAN:  Unassigned ([facility]).   CHIEF COMPLAINT:  Bleeding per rectum.   HISTORY OF PRESENT ILLNESS:  The patient is a 76 year old white female with  a past medical history significant for coronary artery disease, status post  PTCA, COPD, hypertension, hyperlipidemia, peripheral vascular disease, and  tobacco abuse, who presents with the above complaints x1 day.  The patient  reports 5 episodes of rectal bleeding prior to her arrival in the ER, and  had 2 more episodes while in the ER.  She admits to lower abdominal cramping  intermittently for the same duration.  The patient states that she just  completed a course of antibiotics for treatment of pneumonia on the p.m.  prior to admission.  She denies nausea and vomiting, fevers, dysuria.  She  states that it has all been just blood, not mixed with any stool.  The  patient admits to a cough that she has had with the pneumonia.  She denies  chest pain, shortness of breath, and no hematemesis.   The patient was seen in the ER, and a rectal exam done per the ER physician  revealed blood clots.  Her blood pressure was noted to be 110/69 with an  hemoglobin and hematocrit of 12.6 and 37.2, and she is admitted to the [facility]  hospitalist service for further evaluation and management.  GI was consulted  per the ER physician - Dr. Leone Payor to see the patient later in the a.m.   PAST MEDICAL HISTORY:  As stated above.   MEDICATIONS:  1.  Prevacid 15 mg b.i.d.  2.  Lasix 20 daily.  3.  Plavix 75 daily.  4.  Nitroglycerin 0.4.  5.  Lipitor 80 q.h.s.  6.  Toprol 50 daily.  7.  Gabapentin 300 q.h.s.  8.  Isosorbide mononitrate 200/2 daily.  9.  Norvasc 10 mg daily.  10. Pletal 100 mg p.o. b.i.d.   ALLERGIES:  No known drug allergies.   SOCIAL HISTORY:  Positive for tobacco.  Occasional alcohol.   FAMILY HISTORY:  Her mom had an MI at age 78.  She also had a stroke.  Her  dad had an MI at age 21.  Family history also positive for cancer,  unspecified.   REVIEW OF SYSTEMS:  As per HPI.  Other review of systems negative.   PHYSICAL EXAMINATION:  GENERAL:  The patient is an elderly white female who  looks older than her stated age, in no apparent distress.  VITAL SIGNS:  Temperature 99.1 with a blood pressure of 110/69, pulse 66,  respiratory rate 20, O2 saturation of 99%.  HEENT:  Pupils equal, round and reactive to light.  Extraocular movements  intact.  Slightly dry mucous membranes.  No oral exudates.  NECK:  Supple.  No adenopathy, no thyromegaly.  CARDIOVASCULAR:  Regular rate and rhythm.  Normal S1 and S2.  LUNGS:  Coarse breath sounds.  Moderate air movement.  No wheezes and no  crackles appreciated.  ABDOMEN:  Soft.  Bowel sounds present.  Mild lower abdominal tenderness.  No  rebound tenderness.  No masses palpable, and no organomegaly.  EXTREMITIES:  No cyanosis or edema.  NEUROLOGIC:  Alert and oriented x3.  Cranial nerves II-XII grossly intact.  Nonfocal exam.   LABORATORY DATA:  Her white cell count is 9.8, hemoglobin 12.6, hematocrit  37.2, platelet count of 288.  INR is 0.9 with a PT of 12.4.  Sodium 138,  potassium 4.4, chloride 109, CO2 of 24, glucose 109.  BUN 26, creatinine  1.0, and her LFT's are unremarkable.   ASSESSMENT AND PLAN:  1.  Lower gastrointestinal bleed, diverticular versus malignancy.      Inflammatory bowel disease less likely.  Will also obtain Clostridium      difficile toxin and stool studies.  Titrate with IV fluids, PPI, keep      the patient NPO and monitor hemoglobin and hematocrit q.4h. x24 hours.       Type and screen and transfuse as appropriate.  Will hold Plavix and      Pletal for now.  GI consulted, as discussed above.  Dr. Leone Payor to see      the patient later in the a.m.  2.  Hypertension.  Continue outpatient medications with hold parameters.  3.  Coronary artery disease status post percutaneous transluminal coronary      angioplasty.  Chest pain free.  Will obtain an EKG and follow.  Holding      Plavix for now, as discussed above.  4.  Peripheral vascular disease.  As discussed above, holding Pletal for      now.  5.  Chronic obstructive pulmonary disease.  Will place the patient on      bronchodilators and anti-tussives.  6.  Hyperlipidemia.  Continue Zocor.      Kela Millin, M.D.  Electronically Signed     ACV/MEDQ  D:  09/09/2005  T:  09/09/2005  Job:  [id number]   cc:   Dala Dock

## 2010-12-04 ENCOUNTER — Emergency Department (HOSPITAL_COMMUNITY)
Admission: EM | Admit: 2010-12-04 | Discharge: 2010-12-04 | Disposition: A | Discharge status: Home / Self Care | Payer: Medicare Other | Attending: Emergency Medicine | Admitting: Emergency Medicine

## 2010-12-04 DIAGNOSIS — Y92009 Unspecified place in unspecified non-institutional (private) residence as the place of occurrence of the external cause: Secondary | ICD-10-CM | POA: Insufficient documentation

## 2010-12-04 DIAGNOSIS — I2510 Atherosclerotic heart disease of native coronary artery without angina pectoris: Secondary | ICD-10-CM | POA: Insufficient documentation

## 2010-12-04 DIAGNOSIS — T622X1A Toxic effect of other ingested (parts of) plant(s), accidental (unintentional), initial encounter: Secondary | ICD-10-CM | POA: Insufficient documentation

## 2010-12-04 DIAGNOSIS — I10 Essential (primary) hypertension: Secondary | ICD-10-CM | POA: Insufficient documentation

## 2010-12-04 DIAGNOSIS — E7800 Pure hypercholesterolemia, unspecified: Secondary | ICD-10-CM | POA: Insufficient documentation

## 2010-12-04 DIAGNOSIS — I252 Old myocardial infarction: Secondary | ICD-10-CM | POA: Insufficient documentation

## 2010-12-04 DIAGNOSIS — L299 Pruritus, unspecified: Secondary | ICD-10-CM | POA: Insufficient documentation

## 2010-12-04 DIAGNOSIS — M129 Arthropathy, unspecified: Secondary | ICD-10-CM | POA: Insufficient documentation

## 2010-12-04 DIAGNOSIS — F17200 Nicotine dependence, unspecified, uncomplicated: Secondary | ICD-10-CM | POA: Insufficient documentation

## 2010-12-04 DIAGNOSIS — L255 Unspecified contact dermatitis due to plants, except food: Secondary | ICD-10-CM | POA: Insufficient documentation

## 2010-12-04 DIAGNOSIS — J4489 Other specified chronic obstructive pulmonary disease: Secondary | ICD-10-CM | POA: Insufficient documentation

## 2010-12-04 DIAGNOSIS — IMO0002 Reserved for concepts with insufficient information to code with codable children: Secondary | ICD-10-CM | POA: Insufficient documentation

## 2010-12-04 DIAGNOSIS — K219 Gastro-esophageal reflux disease without esophagitis: Secondary | ICD-10-CM | POA: Insufficient documentation

## 2010-12-04 DIAGNOSIS — J449 Chronic obstructive pulmonary disease, unspecified: Secondary | ICD-10-CM | POA: Insufficient documentation

## 2010-12-07 ENCOUNTER — Ambulatory Visit: Payer: Medicare Other | Admitting: Internal Medicine

## 2010-12-08 NOTE — H&P (Signed)
NAME:  Kathy Marshall, Kathy Marshall NO.:  [id number]  MEDICAL RECORD NO.:  [id number]           PATIENT TYPE:  I  LOCATION:  4706                         FACILITY:  [facility]  PHYSICIAN:  Nestor Ramp, MD        DATE OF BIRTH:  09-08-1934  DATE OF ADMISSION:  10/26/2010 DATE OF DISCHARGE:                             HISTORY & PHYSICAL   PRIMARY CARE PHYSICIAN:  [employer].  CHIEF COMPLAINT:  Altered mental status and cough.  HISTORY OF PRESENT ILLNESS:  The patient is a 76 year old female who was brought to the emergency department by her family for evaluation regarding confusion that started this a.m. upon awakening.  The patient also has had positive fever that is since this morning.  Positive cough x2 to 3 weeks, but family reports chronic cough secondary to COPD, seen by PCP for cough 2 weeks ago, placed on Z-Pak.  Per the patient's family, chest x-ray was within normal limits at that time.  The patient seemed to be a little better "after antibiotic until confusion occurred this morning.  Family states that the patient will see microcell's in one moment and 5 minutes later say something "crazy."  Family also endorses significant weight loss over the past one to one and half years from 160 pounds to 108 pounds.  The patient is on chronic morphine for back pain, recent increase in morphine from 15 mg b.i.d. to 30 mg b.i.d. 2 days ago.  After increase in this pain medicine, the patient did have a fall at home where she slid out of bed.  No known injury, but has had some left hip soreness since that time.  ALLERGIES:  No known drug allergies.  MEDICATIONS:  Pharmacy tech to do med reconciliation form: 1. Plavix 75 mg p.o. daily. 2. Omeprazole 20 mg p.o. daily. 3. Alprazolam 0.5 mg p.o. t.i.d. 4. Lipitor 80 mg p.o. daily. 5. Promethazine 25 mg p.o. p.r.n. 6. Cilostazol 50 mg 2 tabs b.i.d. 7. Bystolic 2.5 mg p.o. daily. 8. Isosorbide mononitrate 60 mg  b.i.d. 9. Gabapentin 300 mg daily. 10.Zolpidem 5 mg daily. 11.Advair. 12.Oxycodone/acetaminophen p.r.n. 13.Morphine 30 mg b.i.d.  PAST MEDICAL HISTORY: 1. COPD. 2. Hypertension. 3. Arthritis. 4. Chronic back pain and degenerative disk disease. 5. CAD. 6. GERD. 7. GI bleed. 8. Hypercholesterolemia. 9. MI. 10.TIA 3 months ago.  PAST SURGICAL HISTORY:  Hysterectomy and cardiac stent.  SOCIAL HISTORY:  She lives along with daughter, able to do activities of daily living, tobacco used one pack per day of cigarettes, alcohol, last drink 4 years ago currently does not use alcohol.  Denies any drug use.  FAMILY HISTORY:  Mother stroke, obesity, MI.  Father, stroke, MI. Siblings heart disease.  REVIEW OF SYSTEMS:  Positive for chills, positive decreased appetite, positive fatigue, positive weight change as stated in HPI, chronic headaches off and on lower extremity edema, positive cough, positive dyspnea, positive sputum production and positive nausea.  Review of systems negative for fevers, no sore throat, no chest pain, no wheezing, no vomiting, no diarrhea, no blood in her stool, no melena, no hemoptysis, no dysuria, no hematuria, no rash,  no myalgias, no visual changes, no petechiae, no polyuria and no burning with urination.  PHYSICAL EXAMINATION:  VITAL SIGNS:  Temperature 101.5, pulse 78, respiratory rate 18, blood pressure 155/67, pulse ox 100% on 3 L. GENERAL:  No acute distress, resting quietly. HEENT:  Mucous membranes are moist.  Pupils equal, round, reactive to light and accommodation. NECK:  No lymphadenopathy. CARDIOVASCULAR:  Regular rate and rhythm.  No murmurs, rubs or gallops. Positive bruit over a left carotid. LUNGS:  Clear to auscultation on left with scattered wheezing.  On the right, lung sounds absent in right middle lobe and right lower lobe. ABDOMEN:  Soft, nontender and nondistended. BACK:  No bruising and no rash. EXTREMITIES:  No lower extremity  edema. NEURO:  Alert and oriented to person and place, disoriented to time, had difficulty attending to small task, unable to squeeze my hand when I asked her to squeeze it when I said the letter A.  For example, cranial nerves II through XII grossly intact.  Sensation grossly intact.  Moving all four extremities.  Strength 5/5 in upper extremities.  Strength in the upper extremities equal.  Strength in the lower extremities 4/5. Bilateral right leg strength is less than left leg strength.  Left hip full range of motion.  No bruising in left hip area.  No pain with range of motion.  LABORATORY AND STUDIES:  EKG normal sinus rhythm.  No ST changes.  No significant changes compared to EKG in March 22, 2011.  Sodium 140, potassium 3.3, creatinine 0.55, AST 23, ALT 12, alk phos 82, total bili 0.3 and lactic acid 1.  Point-of-care cardiac enzymes negative.  White blood cells 9.4, hemoglobin 11.9.  UA negative for glucose, no ketones, no total protein, negative nitrite and negative leukocyte esterase. Chest x-ray showed confluent right middle lung lobe infiltrate and lower lobe infiltrate suspicious for pneumonia, also noted COPD changes.  X- ray of abdomen and pelvis no acute findings.  CT of head without contrast showed no evidence of mass, hemorrhage or infarct.  Minimal atrophic changes.  White matter hypodensity appears stable.  No acute or active process seen.  No goal lesions and no sinusitis evident.  ASSESSMENT AND PLAN:  A 76 year old female with past medical history of chronic obstructive pulmonary disease admitted for altered mental status and pneumonia. 1. Pneumonia.  Positive fever on chest exam, minimal to no air     movement on right middle lobe.  Right lower lobe area is consistent     with chest x-ray findings.  Positive history of chills, positive     recent history of bronchitis, diagnosis of pneumonia most likely.     The patient had increased risk of infection due  to past medical     history of chronic obstructive pulmonary disease.  The patient also     is a current smoker.  We will treat pneumonia with Avelox.  No     history of hospital exposures and no need for hospital-acquired     coverage.  We will give albuterol p.r.n.  We will continue steroid     inhaler.  Blood cultures pending.  Blood pressure within normal     limits.  We will monitor if fever occur. 2. Altered mental status most likely secondary to pneumonia.  The     patient does have a history of transient ischemic attack also on     differential.  The patient also on any medications for altered     mental  status.  She has been on long-term, but recent increase in     morphine dosage.  We will treat infection per plan: 3. We will monitor neuro exam, weakness and lower extremities     bilateral, right leg strength less than left leg strength.  The     patient's PCP has said that the patient has lower extremity     weakness in the past.  Family unsure if weakness is usually worse     in right leg.  We will monitor for signs of TIA and stroke.     Positive bruit and left carotid could consider carotid Dopplers.     We will discuss with [facility] Teaching Service Team.     Medication-induced altered mental status is a possibility.  We will     hold morphine.  We will treat chronic pain with Percocet p.r.n.  We     will hold Ambien.  We will decrease p.r.n. Xanax to b.i.d. p.r.n.     The patient normally takes b.i.d., although written for t.i.d.  We     will discontinue Phenergan.  We will monitor mental status closely.     With history of CAD, we will rule out cardiac event, cardiac     enzymes x3 and EKG in a.m.  We will monitor on telemetry.  Again,     most likely altered mental status is secondary to delirium which is     being caused by pneumonia infection. 4. COPD.  We will continue steroid inhaler, continue albuterol inhaler     p.r.n.  We will continue with Avelox daily.   We will titrate O2 to     maintain O2 sats greater than 88%. 5. Chronic back pain, Percocet p.r.n. for back pain, continue     Neurontin, hold morphine b.i.d. for now. 6. Hypertension.  We will monitor blood pressure closely.  We will     restart home meds. 7. GERD, restart omeprazole. 8. Hypokalemia 3.3 on admission repleted by ER MD.  We will recheck in     a.m. 9. Left hip pain, status post fall 2 days ago.  Full range of motion     on exam with no pain, no bruising.  We will monitor physical exam.     The patient on fall precautions. 10.Weight loss, significant weight loss over the past 1-2 years 160     pounds to 108 pounds currently.  We will treat pneumonia, but need     to ensure the chest x-ray clears and that there is not a mass     present.  Per the patient's, this type of pneumonia especially in     the setting of her cigarette use.  To consider CT scan, if we     wanted to get a better look at lung findings.  No blood in stools.     No dark stools, need to find a record for last colonoscopy, need to     keep this modestly care for the patient, may need to complete this     workup as outpatient. 11.Smoking tobacco addiction, nicotine patch, encourage smoking     cessation. 12.Prophylaxis.  Regular diet p.o. and Plavix. 13.Disposition pending clinical improvement.     Ellin Mayhew, MD   ______________________________ Nestor Ramp, MD    DC/MEDQ  D:  10/26/2010  T:  10/27/2010  Job:  [id number]  Electronically Signed by Ellin Mayhew  on 11/18/2010 08:34:45 AM Electronically Signed by Huntley Dec  Rafan Sanders MD on 12/08/2010 11:56:26 AM

## 2010-12-08 NOTE — Discharge Summary (Signed)
NAME:  Kathy Marshall, Kathy Marshall NO.:  [id number]  MEDICAL RECORD NO.:  [id number]           PATIENT TYPE:  I  LOCATION:  4706                         FACILITY:  [facility]  PHYSICIAN:  Nestor Ramp, MD        DATE OF BIRTH:  1935-03-01  DATE OF ADMISSION:  10/26/2010 DATE OF DISCHARGE:  10/28/2010                              DISCHARGE SUMMARY   PRIMARY CARE PROVIDER:  [facility].  DISCHARGE DIAGNOSES: 1. Altered mental status. 2. Pneumonia. 3. Chronic obstructive pulmonary disease. 4. Hypertension. 5. Arthritis. 6. Chronic pain. 7. Coronary artery disease. 8. Gastroesophageal reflux disease. 9. History of gastrointestinal bleed. 10.History of transient ischemic attack. 11.Hyperlipidemia. 12.History of myocardial infarction.  DISCHARGE MEDICATIONS:  New medications: 1. Xanax 0.5 mg p.o. b.i.d. p.r.n. 2. Gabapentin 300 mg p.o. b.i.d. 3. Avelox 400 mg p.o. daily for 12 additional days. 4. NicoDerm 21 mg transdermally daily. 5. Percocet 5/325 1 tablet p.o. q.6 h. p.r.n.  She is to continue the following medications: 1. Advair 1 puff inhaled b.i.d. 2. Aspirin 81 mg p.o. daily. 3. Bystolic 2.5 mg 1 tablet p.o. daily 4. Imdur 1 tablet p.o. b.i.d. 5. Lipitor 80 mg 1 tablet p.o. daily. 6. Nitroglycerin 0.4 mg 1 tablet sublingual every 5 minutes x3 doses     p.r.n. 7. Omeprazole 20 mg one cap p.o. daily. 8. Pletal 50 mg 2 tablets p.o. b.i.d. 9. Promethazine 25 mg 1 tablet p.o. q.4 h. p.r.n. 10.Plavix 75 mg p.o. daily. 11.ProAir 1 puff inhaled q.4 h. p.r.n. for shortness of breath.  She is to discontinue the following medications: 1. Oxycodone/acetaminophen 10/325 1 tablet p.o. q.6 h. p.r.n. 2. Xanax 0.5 mg 1 tablet p.o. t.i.d. p.r.n. 3. Ambien 1 tablet p.o. at bedtime p.r.n. 4. Gabapentin 300 mg 1 cap p.o. at bedtime. 5. Morphine 30 mg b.i.d.  IMAGING AND STUDIES:  She had a CT of the head impression, 1. No evidence of brain mass, hemorrhage, or  acute infarction. 2. Minimal cortical atrophic changes and chronic white matter     hypodensity, microvascular ischemic changes appear stable. 3. No acute active process is seen.  No skull lesion is evident.  No     sinusitis is evident.  X-ray of the pelvis impression no acute findings.  Chest x-ray impression, 1. New confluent right midlung lower lobe infiltrate, suspicious for     pneumonia.  Recommend clinical correlation of post-treatment     radiographic follow up to confirm resolution. 2. COPD.  LABORATORY DATA:  She had a urinalysis that was negative.  Urine culture showed 100,000 colonies of multiple morphocytes.  CBC at time of admission, WBCs of 9.4, hemoglobin 11.9, hematocrit 37.8, platelets 195. Lactic acid 1.0, sodium 140, potassium 3.3, chloride 103, bicarb 26, BUN of 11, creatinine 0.55, glucose of 100.  Troponin less than 0.30 x2.  At time of discharge, sodium 140, potassium 2.9, repleted prior to discharge, chloride 103, bicarb 30, BUN of 8, creatinine less than 0.47, glucose of 96.  WBC of 7, hemoglobin 11.1, hematocrit 34.10, platelets 222.  BRIEF HOSPITAL COURSE:  This is 76 year old female with history of COPD who presents with  altered mental status, found to have right middle lobe and right lower lobe pneumonia. 1. Altered mental status.  The patient initially presented the patient     was altered mentally.  There was concern for potential stroke or     TIA given the patient's history.  Her CT showed no new changes.  It     was also felt that some polypharmacy may be contributing to her     altered mental status.  Ultimately because of her altered mental     status, it was found to be an infectious process.  The patient is     found to have pneumonia.  With hydration and treatment of her     pneumonia with antibiotics, her mental status cleared and she was     back to baseline at time of discharge.  However, since polypharmacy     was considered to be a  potential cause of her mental status and she     did have recent increase in her morphine, her morphine as well as     her Ambien were discontinued.  Her Xanax was decreased from t.i.d.     to b.i.d. 2. Pneumonia.  Physical exam and chest x-ray consistent with right-     sided pneumonia.  The patient was admitted and started on Avelox.     Blood cultures were obtained with no growth.  The patient remained     stable during the hospitalization.  Since the patient did have     underlying COPD, she was continued on her home medications for     this.  At time of discharge, the patient had no O2 requirements and     no hypoxia. 3. Chronic pain.  As above, her morphine was held and she was given a     decreased dose of her Percocet.  She did well with this pain     regimen and her pain was well controlled throughout the     hospitalization.  She will be discharged on Percocet 5/325 q.6 h.     p.r.n.  Her gabapentin was also titrated up during the     hospitalization to achieve optimal pain control.  If she continue     to have pain, may consider adding back on the narcotics ad lib     slowly. 4. Arthritis.  The patient initially did have some hip pain at time of     admission from sliding out of bed due to her illness and weakness.     Pelvic x-rays were obtained which were negative.  She was able to     ambulate without difficulty during the hospitalization.  Her pain     was well controlled with Percocet as well as gabapentin. 5. Hypertension.  The patient was continued on her home meds     throughout the hospitalization.  Her blood pressure remained well     controlled throughout the hospitalization.  There were no changes     to her home medication regimen at time of discharge. 6. Gastroesophageal reflux disease.  The patient is continued on PPI     during the hospitalization. 7. Hypokalemia.  Potassium 3.3 on admission.  This was repleted in the     ED initially.  Repeat potassium was  low on discharge 2.9.  This     repleted prior to discharge. 8. Weight loss.  The patient reported significant weight loss over the     past 1-2 years  going from 160 pounds to 108 pounds.  The patient     stated she was currently being worked up by her PCP.  No further     workup was completed in the hospital for this.  We will defer to     PCP to continue planned workup. 9. Tobacco abuse.  Patient is given nicotine patch during     hospitalization as well as given prescription for patch at time of     discharge.  The patient was counseled and encouraged on smoking     cessation.  DISCHARGE INSTRUCTIONS:  The patient was instructed to increase activity slowly.  She is to have no restrictions in her diet.  She is to stop smoking.  She is to avoid any activity that causes chest pain, shortness of breath, dizziness, sweating, or excessive weakness and avoid straining.  FOLLOWUP:  She is to follow up with her primary care physician at [facility].  She is to call and make an appointment within 1- 2 weeks.  DISCHARGE CONDITION:  The patient was discharged to home in stable medical condition.    ______________________________ Everrett Coombe, MD   ______________________________ Nestor Ramp, MD    CM/MEDQ  D:  10/31/2010  T:  11/01/2010  Job:  [id number]  Electronically Signed by Everrett Coombe MD on 11/16/2010 01:47:15 PM Electronically Signed by Denny Levy MD on 12/08/2010 11:56:11 AM

## 2010-12-15 ENCOUNTER — Encounter: Payer: Self-pay | Admitting: Internal Medicine

## 2011-01-16 ENCOUNTER — Inpatient Hospital Stay (INDEPENDENT_AMBULATORY_CARE_PROVIDER_SITE_OTHER)
Admission: RE | Admit: 2011-01-16 | Discharge: 2011-01-16 | Disposition: A | Discharge status: Home / Self Care | Payer: Medicare Other | Source: Ambulatory Visit | Attending: Family Medicine | Admitting: Family Medicine

## 2011-01-16 DIAGNOSIS — T148XXA Other injury of unspecified body region, initial encounter: Secondary | ICD-10-CM

## 2011-01-28 ENCOUNTER — Telehealth: Payer: Self-pay | Admitting: Internal Medicine

## 2011-01-28 NOTE — Telephone Encounter (Signed)
Message copied by Arna Snipe on Fri Jan 28, 2011  3:15 PM ------      Message from: Richardson Chiquito      Created: Wed Sep 29, 2010  1:11 PM                   ----- Message -----         From: Hart Carwin, MD         Sent: 09/29/2010   1:08 PM           To: Vernia Buff, CMA            Yes, please,  Bill for late cancelation      ----- Message -----         From: Vernia Buff, CMA         Sent: 09/29/2010   9:16 AM           To: Hart Carwin, MD            Dr Juanda Chance, see phone note and note below. Do you want to charge no show/late cancellation fee?            Per [place]        Message          Pts daughter just called to r/s appt for her mother because she is vomiting and does not want to leave her house, pt is ok with taking next available which is in june

## 2011-02-02 ENCOUNTER — Encounter: Payer: Self-pay | Admitting: Internal Medicine

## 2011-02-02 ENCOUNTER — Ambulatory Visit (INDEPENDENT_AMBULATORY_CARE_PROVIDER_SITE_OTHER): Payer: Medicare Other | Admitting: Internal Medicine

## 2011-02-02 VITALS — BP 132/64 | HR 60 | Ht 62.0 in | Wt 102.0 lb

## 2011-02-02 DIAGNOSIS — R1310 Dysphagia, unspecified: Secondary | ICD-10-CM

## 2011-02-02 DIAGNOSIS — R1319 Other dysphagia: Secondary | ICD-10-CM

## 2011-02-02 DIAGNOSIS — R634 Abnormal weight loss: Secondary | ICD-10-CM

## 2011-02-02 MED ORDER — MEGESTROL ACETATE 40 MG/ML PO SUSP: 200.0000 mg | Freq: Every day | ORAL | Status: AC

## 2011-02-02 MED ORDER — PEG-KCL-NACL-NASULF-NA ASC-C 100 G PO SOLR: 1.0000 | Freq: Once | ORAL | Status: DC

## 2011-02-02 NOTE — Patient Instructions (Signed)
You have been scheduled for an endoscopy and colonoscopy. Please follow the written instructions given to you at your visit today. Please pick up your Moviprep at the pharmacy within the next 2-3 days. We have sent the following medications to your pharmacy for you to pick up at your convenience: Megace. Take 1 teaspoon (5 ml) by mouth once daily. Continue Ensure supplement. CC: Dr Karie Schwalbe. [place]

## 2011-02-02 NOTE — Progress Notes (Signed)
Kathy Marshall [birth year] MRN [id number]    History of Present Illness:  This is a 76 year old white female with dysphagia to solids and liquids and a weight loss of about 50 pounds. Our records show that she weighed 166 pounds in 2003, 150 pounds in 2009 and she is currently 102 pounds. She has COPD but continues to smoke. Her appetite has been decreased. She cancelled her appointments with [place] in February 2012, April 2012 and in June 2012. She denies abdominal pain. She takes chronic narcotics for back pain. She chokes on food and her saliva. She has been drinking Ensure, 2 cans a day .We did an upper endoscopy and colonoscopy in March 2007 with findings of reflux esophagitis and H. pylori gastritis. She also had candida esophagitis which was treated. She was in the hospital at that time and I am not sure if she was treated for the H. pylori. A colonoscopy showed diverticulosis. She then presented with anemia and hematochezia due to diverticular bleed. She denies being constipated. During her recent hospitalization for pneumonia in May 2012, her hemoglobin was 10.9 with an MCV of 89.   Past Medical History  Diagnosis Date  . GERD (gastroesophageal reflux disease)   . Low back pain   . Bronchitis   . Chronic hoarseness   . Sleep apnea, obstructive   . Renal artery stenosis   . PVD (peripheral vascular disease)   . Hypercholesteremia   . Pain in limb   . COPD (chronic obstructive pulmonary disease)   . Chronic headache   . Benign essential hypertension   . Atherosclerosis     coronary vessel type  . Coronary artery disease   . Diverticulosis   . History of GI diverticular bleed   . Myocardial infarct   . History of esophagitis   . H. pylori infection   . Altered mental status   . Pneumonia   . TIA (transient ischemic attack) 07/2010  . Arthritis   . GERD (gastroesophageal reflux disease)   . History of blood clots    Past Surgical History  Procedure Date  . Oophorectomy     . Coronary angioplasty with stent placement   . Abdominal hysterectomy     reports that she has been smoking.  She has never used smokeless tobacco. She reports that she drinks alcohol. She reports that she does not use illicit drugs. family history includes Heart disease in her father, mother, and sister and Stroke in her father and mother.  There is no history of Colon cancer. No Known Allergies      Review of Systems: Positive for dysphagia negative for odynophagia. Frequent cough. Shortness of breath. Decreased appetite and decreased energy  The remainder of the 10  point ROS is negative except as outlined in H&P   Physical Exam: General appearance chronically appearing female smelling of smoke and coughing at times. She is alert, oriented and cooperative. Eyes- non icteric. HEENT nontraumatic, normocephalic. Mouth no lesions, tongue papillated, no cheilosis. Neck supple without adenopathy, thyroid not enlarged, short carotid bruits, transmitted from precordium no JVD. Lungs Clear to auscultation bilaterally, few musical sounds. Cor normal S1 normal S2, regular rhythm , 1/6 systolic murmur,  quiet precordium. Abdomen relaxed, thin with palpable loops of the colon and the bowel. Palpable stool in the colon. No tenderness and no distention, no bruit. Rectal: Moderate amount of Hemoccult-negative stool. Extremities no pedal edema. Skin no lesions, multiple ecchymoses. Neurological alert and oriented x 3. Psychological normal mood and affect.  Assessment and Plan:  Problem #1 weight loss. This is probably a result of decreased appetite, pain medications, dyspnea from COPD and dysphagia. We need to rule out occult malignancy. We also need to rule out esophageal or throat cancer. We will proceed with an upper endoscopy and also with colonoscopy. She may need a CT scan of the abdomen. We will start her on Megace 200 mg daily and have her to continue Protonix and Ensure.  Problem #2  anemia. She is Hemoccult-negative on my exam today. This could be due to chronic disease.  Problem #3 history of H. pylori gastritis. We will recheck this by doing an endoscopy .    02/02/2011 [name]

## 2011-02-03 ENCOUNTER — Telehealth: Payer: Self-pay | Admitting: Internal Medicine

## 2011-02-03 NOTE — Telephone Encounter (Signed)
Will make a decision at the EGD  when I will be able to talk to her and her daughter.

## 2011-02-03 NOTE — Telephone Encounter (Signed)
Patient calling to report that the Megace tastes bad and makes her vomit. States she took the Megace 30 minutes after eating toast. She states she vomited immediately  and has felt "sick on my stomach since taking the Megace." Wants to know if she can try anything else. Please, advise.

## 2011-02-03 NOTE — Telephone Encounter (Signed)
Spoke with patient's daughter and gave her Dr. Brodie's recommendation. 

## 2011-03-03 ENCOUNTER — Ambulatory Visit (AMBULATORY_SURGERY_CENTER): Payer: Medicare Other | Admitting: Internal Medicine

## 2011-03-03 ENCOUNTER — Encounter: Payer: Self-pay | Admitting: Internal Medicine

## 2011-03-03 DIAGNOSIS — D126 Benign neoplasm of colon, unspecified: Secondary | ICD-10-CM

## 2011-03-03 DIAGNOSIS — R634 Abnormal weight loss: Secondary | ICD-10-CM

## 2011-03-03 DIAGNOSIS — R1310 Dysphagia, unspecified: Secondary | ICD-10-CM

## 2011-03-03 DIAGNOSIS — K2100 Gastro-esophageal reflux disease with esophagitis, without bleeding: Secondary | ICD-10-CM

## 2011-03-03 DIAGNOSIS — K2960 Other gastritis without bleeding: Secondary | ICD-10-CM

## 2011-03-03 MED ORDER — SODIUM CHLORIDE 0.9 % IV SOLN: 500.0000 mL | INTRAVENOUS | Status: DC

## 2011-03-04 ENCOUNTER — Telehealth: Payer: Self-pay | Admitting: *Deleted

## 2011-03-04 NOTE — Telephone Encounter (Signed)

## 2011-03-09 ENCOUNTER — Encounter: Payer: Self-pay | Admitting: Internal Medicine

## 2011-03-17 LAB — POCT I-STAT CREATININE: Operator id: 294341

## 2011-03-17 LAB — I-STAT 8, (EC8 V) (CONVERTED LAB)
BUN: 15
Bicarbonate: 23.5
Glucose, Bld: 102 — ABNORMAL HIGH
Sodium: 140
TCO2: 25
pCO2, Ven: 34.7 — ABNORMAL LOW
pH, Ven: 7.439 — ABNORMAL HIGH

## 2011-04-03 ENCOUNTER — Inpatient Hospital Stay (INDEPENDENT_AMBULATORY_CARE_PROVIDER_SITE_OTHER)
Admission: RE | Admit: 2011-04-03 | Discharge: 2011-04-03 | Disposition: A | Discharge status: Home / Self Care | Payer: Medicare Other | Source: Ambulatory Visit | Attending: Emergency Medicine | Admitting: Emergency Medicine

## 2011-04-03 DIAGNOSIS — K5289 Other specified noninfective gastroenteritis and colitis: Secondary | ICD-10-CM

## 2011-04-04 ENCOUNTER — Observation Stay (HOSPITAL_COMMUNITY)
Admission: EM | Admit: 2011-04-04 | Discharge: 2011-04-04 | Disposition: A | Discharge status: Home / Self Care | Payer: Medicare Other | Source: Ambulatory Visit | Attending: Emergency Medicine | Admitting: Emergency Medicine

## 2011-04-04 DIAGNOSIS — E860 Dehydration: Principal | ICD-10-CM | POA: Insufficient documentation

## 2011-04-04 DIAGNOSIS — K5289 Other specified noninfective gastroenteritis and colitis: Secondary | ICD-10-CM | POA: Insufficient documentation

## 2011-04-04 LAB — COMPREHENSIVE METABOLIC PANEL
Alkaline Phosphatase: 65 U/L (ref 39–117)
BUN: 29 mg/dL — ABNORMAL HIGH (ref 6–23)
CO2: 26 mEq/L (ref 19–32)
Calcium: 8.8 mg/dL (ref 8.4–10.5)
GFR calc Af Amer: >90 mL/min (ref >90)
GFR calc non Af Amer: 89 mL/min — ABNORMAL LOW (ref >90)
Glucose, Bld: 109 mg/dL — ABNORMAL HIGH (ref 70–99)
Total Protein: 6.7 g/dL (ref 6.0–8.3)

## 2011-04-04 LAB — URINALYSIS, ROUTINE W REFLEX MICROSCOPIC
Glucose, UA: NEGATIVE mg/dL
Ketones, ur: 15 mg/dL — AB
Specific Gravity, Urine: 1.036 — ABNORMAL HIGH (ref 1.005–1.030)
pH: 6 (ref 5.0–8.0)

## 2011-04-04 LAB — DIFFERENTIAL
Lymphocytes Relative: 19 % (ref 12–46)
Lymphs Abs: 1.2 10*3/uL (ref 0.7–4.0)
Monocytes Absolute: 0.6 10*3/uL (ref 0.1–1.0)
Monocytes Relative: 10 % (ref 3–12)
Neutro Abs: 4.2 10*3/uL (ref 1.7–7.7)

## 2011-04-04 LAB — CBC
HCT: 36.3 % (ref 36.0–46.0)
Hemoglobin: 11.9 g/dL — ABNORMAL LOW (ref 12.0–15.0)
MCH: 30.4 pg (ref 26.0–34.0)
MCHC: 32.8 g/dL (ref 30.0–36.0)

## 2011-04-04 LAB — URINE MICROSCOPIC-ADD ON

## 2011-04-06 LAB — I-STAT 8, (EC8 V) (CONVERTED LAB)
Acid-base deficit: 2
Chloride: 110
HCT: 35 — ABNORMAL LOW
Operator id: 270111
Potassium: 4.3
TCO2: 24
pCO2, Ven: 37.6 — ABNORMAL LOW

## 2011-05-23 ENCOUNTER — Encounter: Payer: Medicare Other | Admitting: Internal Medicine

## 2011-05-23 ENCOUNTER — Encounter: Payer: Self-pay | Admitting: Internal Medicine

## 2011-05-25 NOTE — Progress Notes (Signed)
This encounter was created in error - please disregard.

## 2011-07-22 ENCOUNTER — Telehealth: Payer: Self-pay | Admitting: Internal Medicine

## 2011-07-22 NOTE — Telephone Encounter (Signed)
Message copied by Arna Snipe on Fri Jul 22, 2011 11:23 AM ------      Message from: Richardson Chiquito      Created: Tue May 24, 2011  1:30 PM      Regarding: FW: charge for [employer]                   ----- Message -----         From: Hart Carwin, MD         Sent: 05/24/2011   1:18 PM           To: Vernia Buff, CMA      Subject: charge for NO SHOW                                       Please, charge for NO show.      ----- Message -----         From: Vernia Buff, CMA         Sent: 05/23/2011   5:09 PM           To: Hart Carwin, MD            Patient no showed appointment for 05/23/11. Do you want to charge no show fee?

## 2012-01-23 ENCOUNTER — Other Ambulatory Visit: Payer: Self-pay | Admitting: Family Medicine

## 2012-01-23 DIAGNOSIS — R911 Solitary pulmonary nodule: Secondary | ICD-10-CM

## 2012-01-31 ENCOUNTER — Other Ambulatory Visit: Payer: Medicare Other

## 2012-07-28 DIAGNOSIS — K259 Gastric ulcer, unspecified as acute or chronic, without hemorrhage or perforation: Secondary | ICD-10-CM

## 2012-07-28 HISTORY — DX: Gastric ulcer, unspecified as acute or chronic, without hemorrhage or perforation: K25.9

## 2012-08-19 ENCOUNTER — Encounter (HOSPITAL_COMMUNITY): Payer: Self-pay | Admitting: *Deleted

## 2012-08-19 ENCOUNTER — Emergency Department (HOSPITAL_COMMUNITY)
Admission: EM | Admit: 2012-08-19 | Discharge: 2012-08-19 | Discharge status: Absent without leave | Payer: Self-pay | Source: Home / Self Care

## 2012-08-19 ENCOUNTER — Inpatient Hospital Stay (HOSPITAL_COMMUNITY)
Admission: EM | Admit: 2012-08-19 | Discharge: 2012-08-23 | DRG: 378 | Disposition: A | Discharge status: Home / Self Care | Payer: Medicare Other | Attending: Internal Medicine | Admitting: Internal Medicine

## 2012-08-19 ENCOUNTER — Emergency Department (INDEPENDENT_AMBULATORY_CARE_PROVIDER_SITE_OTHER)
Admission: EM | Admit: 2012-08-19 | Discharge: 2012-08-19 | Disposition: A | Discharge status: Nursing Home (Includes ICF) | Payer: Medicare Other | Source: Home / Self Care | Attending: Family Medicine | Admitting: Family Medicine

## 2012-08-19 ENCOUNTER — Encounter (HOSPITAL_COMMUNITY): Payer: Self-pay | Admitting: Emergency Medicine

## 2012-08-19 DIAGNOSIS — Z79899 Other long term (current) drug therapy: Secondary | ICD-10-CM

## 2012-08-19 DIAGNOSIS — Z8719 Personal history of other diseases of the digestive system: Secondary | ICD-10-CM

## 2012-08-19 DIAGNOSIS — K250 Acute gastric ulcer with hemorrhage: Principal | ICD-10-CM

## 2012-08-19 DIAGNOSIS — I2510 Atherosclerotic heart disease of native coronary artery without angina pectoris: Secondary | ICD-10-CM | POA: Diagnosis present

## 2012-08-19 DIAGNOSIS — G4733 Obstructive sleep apnea (adult) (pediatric): Secondary | ICD-10-CM

## 2012-08-19 DIAGNOSIS — Z8673 Personal history of transient ischemic attack (TIA), and cerebral infarction without residual deficits: Secondary | ICD-10-CM

## 2012-08-19 DIAGNOSIS — K219 Gastro-esophageal reflux disease without esophagitis: Secondary | ICD-10-CM | POA: Diagnosis present

## 2012-08-19 DIAGNOSIS — D649 Anemia, unspecified: Secondary | ICD-10-CM | POA: Diagnosis present

## 2012-08-19 DIAGNOSIS — J4489 Other specified chronic obstructive pulmonary disease: Secondary | ICD-10-CM | POA: Diagnosis present

## 2012-08-19 DIAGNOSIS — I739 Peripheral vascular disease, unspecified: Secondary | ICD-10-CM

## 2012-08-19 DIAGNOSIS — K920 Hematemesis: Secondary | ICD-10-CM

## 2012-08-19 DIAGNOSIS — Z681 Body mass index (BMI) 19 or less, adult: Secondary | ICD-10-CM

## 2012-08-19 DIAGNOSIS — E785 Hyperlipidemia, unspecified: Secondary | ICD-10-CM | POA: Diagnosis present

## 2012-08-19 DIAGNOSIS — J40 Bronchitis, not specified as acute or chronic: Secondary | ICD-10-CM

## 2012-08-19 DIAGNOSIS — G8929 Other chronic pain: Secondary | ICD-10-CM | POA: Diagnosis present

## 2012-08-19 DIAGNOSIS — R630 Anorexia: Secondary | ICD-10-CM | POA: Diagnosis present

## 2012-08-19 DIAGNOSIS — M545 Low back pain: Secondary | ICD-10-CM

## 2012-08-19 DIAGNOSIS — J449 Chronic obstructive pulmonary disease, unspecified: Secondary | ICD-10-CM

## 2012-08-19 DIAGNOSIS — I10 Essential (primary) hypertension: Secondary | ICD-10-CM

## 2012-08-19 DIAGNOSIS — R498 Other voice and resonance disorders: Secondary | ICD-10-CM

## 2012-08-19 DIAGNOSIS — Z7982 Long term (current) use of aspirin: Secondary | ICD-10-CM

## 2012-08-19 DIAGNOSIS — I252 Old myocardial infarction: Secondary | ICD-10-CM

## 2012-08-19 DIAGNOSIS — K922 Gastrointestinal hemorrhage, unspecified: Secondary | ICD-10-CM | POA: Diagnosis present

## 2012-08-19 DIAGNOSIS — M79609 Pain in unspecified limb: Secondary | ICD-10-CM

## 2012-08-19 DIAGNOSIS — R634 Abnormal weight loss: Secondary | ICD-10-CM | POA: Diagnosis present

## 2012-08-19 DIAGNOSIS — E7800 Pure hypercholesterolemia, unspecified: Secondary | ICD-10-CM

## 2012-08-19 DIAGNOSIS — R112 Nausea with vomiting, unspecified: Secondary | ICD-10-CM | POA: Diagnosis present

## 2012-08-19 DIAGNOSIS — Z7902 Long term (current) use of antithrombotics/antiplatelets: Secondary | ICD-10-CM

## 2012-08-19 DIAGNOSIS — I701 Atherosclerosis of renal artery: Secondary | ICD-10-CM

## 2012-08-19 DIAGNOSIS — F17200 Nicotine dependence, unspecified, uncomplicated: Secondary | ICD-10-CM | POA: Diagnosis present

## 2012-08-19 DIAGNOSIS — K5730 Diverticulosis of large intestine without perforation or abscess without bleeding: Secondary | ICD-10-CM

## 2012-08-19 DIAGNOSIS — J849 Interstitial pulmonary disease, unspecified: Secondary | ICD-10-CM | POA: Diagnosis present

## 2012-08-19 DIAGNOSIS — R51 Headache: Secondary | ICD-10-CM | POA: Diagnosis present

## 2012-08-19 LAB — CBC
Hemoglobin: 11.9 g/dL — ABNORMAL LOW (ref 12.0–15.0)
RBC: 3.84 MIL/uL — ABNORMAL LOW (ref 3.87–5.11)
WBC: 5.1 10*3/uL (ref 4.0–10.5)

## 2012-08-19 LAB — COMPREHENSIVE METABOLIC PANEL
AST: 17 U/L (ref 0–37)
Albumin: 3.6 g/dL (ref 3.5–5.2)
BUN: 35 mg/dL — ABNORMAL HIGH (ref 6–23)
Calcium: 9.2 mg/dL (ref 8.4–10.5)
Creatinine, Ser: 0.56 mg/dL (ref 0.50–1.10)
Total Bilirubin: 0.2 mg/dL — ABNORMAL LOW (ref 0.3–1.2)
Total Protein: 7.6 g/dL (ref 6.0–8.3)

## 2012-08-19 LAB — URINALYSIS, ROUTINE W REFLEX MICROSCOPIC
Ketones, ur: 40 mg/dL — AB
Nitrite: NEGATIVE
Specific Gravity, Urine: 1.03 (ref 1.005–1.030)
Urobilinogen, UA: 1 mg/dL (ref 0.0–1.0)

## 2012-08-19 LAB — CBC WITH DIFFERENTIAL/PLATELET
Basophils Absolute: 0 10*3/uL (ref 0.0–0.1)
Basophils Relative: 0 % (ref 0–1)
Eosinophils Absolute: 0 10*3/uL (ref 0.0–0.7)
Eosinophils Relative: 0 % (ref 0–5)
HCT: 38.8 % (ref 36.0–46.0)
Hemoglobin: 13.6 g/dL (ref 12.0–15.0)
MCH: 31.6 pg (ref 26.0–34.0)
MCHC: 35.1 g/dL (ref 30.0–36.0)
MCV: 90.2 fL (ref 78.0–100.0)
Monocytes Absolute: 0.1 10*3/uL (ref 0.1–1.0)
Monocytes Relative: 2 % — ABNORMAL LOW (ref 3–12)
Neutro Abs: 5.1 10*3/uL (ref 1.7–7.7)
RDW: 13.6 % (ref 11.5–15.5)

## 2012-08-19 LAB — OCCULT BLOOD GASTRIC / DUODENUM (SPECIMEN CUP): Occult Blood, Gastric: POSITIVE — AB

## 2012-08-19 LAB — OCCULT BLOOD, POC DEVICE: Fecal Occult Bld: POSITIVE — AB

## 2012-08-19 LAB — URINE MICROSCOPIC-ADD ON

## 2012-08-19 MED ORDER — SODIUM CHLORIDE 0.9 % IV BOLUS (SEPSIS)
1000.0000 mL | Freq: Once | INTRAVENOUS | Status: AC
  Administered 2012-08-19: 1000 mL via INTRAVENOUS

## 2012-08-19 MED ORDER — ONDANSETRON HCL 4 MG/2ML IJ SOLN
INTRAMUSCULAR | Status: AC
  Administered 2012-08-19: 4 mg
  Filled 2012-08-19: qty 2

## 2012-08-19 MED ORDER — MORPHINE SULFATE 4 MG/ML IJ SOLN
4.0000 mg | Freq: Once | INTRAMUSCULAR | Status: AC
  Administered 2012-08-19: 4 mg via INTRAVENOUS
  Filled 2012-08-19: qty 1

## 2012-08-19 NOTE — ED Provider Notes (Signed)
History     CSN: [id number]  Arrival date & time 08/19/12  1704   First MD Initiated Contact with Patient 08/19/12 1705      No chief complaint on file.   (Consider location/radiation/quality/duration/timing/severity/associated sxs/prior treatment) Patient is a 78 y.o. female presenting with vomiting. The history is provided by the patient and a relative.  Emesis Severity:  Moderate Duration:  12 hours Timing:  Intermittent Quality:  Bright red blood Progression:  Unchanged Chronicity:  Chronic Associated symptoms: chills and fever   Associated symptoms: no diarrhea     Past Medical History  Diagnosis Date  . GERD (gastroesophageal reflux disease)   . Low back pain   . Bronchitis   . Chronic hoarseness   . Sleep apnea, obstructive   . Renal artery stenosis   . PVD (peripheral vascular disease)   . Hypercholesteremia   . Pain in limb   . COPD (chronic obstructive pulmonary disease)   . Chronic headache   . Benign essential hypertension   . Atherosclerosis     coronary vessel type  . Coronary artery disease   . Diverticulosis   . History of GI diverticular bleed   . Myocardial infarct   . History of esophagitis   . H. pylori infection   . Altered mental status   . Pneumonia   . TIA (transient ischemic attack) 07/2010  . Arthritis   . GERD (gastroesophageal reflux disease)   . History of blood clots     Past Surgical History  Procedure Laterality Date  . Oophorectomy    . Coronary angioplasty with stent placement    . Abdominal hysterectomy      Family History  Problem Relation Age of Onset  . Heart disease Mother   . Stroke Mother   . Stomach cancer Mother   . Heart disease Father   . Stroke Father   . Heart disease Sister   . Colon cancer Neg Hx     History  Substance Use Topics  . Smoking status: Current Every Day Smoker -- 1.00 packs/day for 56 years    Types: Cigarettes  . Smokeless tobacco: Never Used  . Alcohol Use: No     Comment:  occasional    OB History   Grav Para Term Preterm Abortions TAB SAB Ect Mult Living                  Review of Systems  Constitutional: Positive for chills.  Gastrointestinal: Positive for nausea and vomiting. Negative for diarrhea.    Allergies  Review of patient's allergies indicates no known allergies.  Home Medications   Current Outpatient Rx  Name  Route  Sig  Dispense  Refill  . albuterol (PROAIR HFA) 108 (90 BASE) MCG/ACT inhaler   Inhalation   Inhale 2 puffs into the lungs.           . ALPRAZolam (XANAX) 0.5 MG tablet   Oral   Take 0.5 mg by mouth 3 (three) times daily as needed.           Marland Kitchen amLODipine (NORVASC) 10 MG tablet   Oral   Take 10 mg by mouth daily.           Marland Kitchen aspirin 81 MG EC tablet   Oral   Take 81 mg by mouth daily.           Marland Kitchen atorvastatin (LIPITOR) 80 MG tablet   Oral   Take 80 mg by mouth daily.           Marland Kitchen  benzonatate (TESSALON) 100 MG capsule   Oral   Take 100 mg by mouth 3 (three) times daily as needed.           . cilostazol (PLETAL) 100 MG tablet   Oral   Take 100 mg by mouth 2 (two) times daily.           Marland Kitchen Dextromethorphan-Guaifenesin (GFN 1200/DM 60) 60-1200 MG per 12 hr tablet   Oral   Take 1 tablet by mouth. One tablet by mouth every 12 hours as needed for cough          . furosemide (LASIX) 20 MG tablet   Oral   Take 20 mg by mouth daily.           Marland Kitchen gabapentin (NEURONTIN) 300 MG capsule   Oral   Take 300 mg by mouth. 1 by mouth nightly          . HYDROcodone-acetaminophen (VICODIN) 5-500 MG per tablet   Oral   Take 1 tablet by mouth. 1-2 by mouth every 6 hours as needed pain          . metoprolol tartrate (LOPRESSOR) 25 MG tablet   Oral   Take 25 mg by mouth. Take 1 and 1/2 tabs by mouth daily          . morphine (MS CONTIN) 30 MG 12 hr tablet   Oral   Take 30 mg by mouth. Every 12 hours as needed for pain          . pantoprazole (PROTONIX) 40 MG tablet   Oral   Take 40 mg by  mouth daily.             BP 140/90  Pulse 100  Temp(Src) 100.2 F (37.9 C) (Oral)  Resp 19  SpO2 95%  Physical Exam  Nursing note and vitals reviewed. Constitutional: She is oriented to person, place, and time. She appears well-developed and well-nourished.  HENT:  Mouth/Throat: Oropharynx is clear and moist.  Neck: Normal range of motion. Neck supple.  Cardiovascular: Normal rate, regular rhythm and normal heart sounds.   Pulmonary/Chest: Breath sounds normal.  Abdominal: Soft. She exhibits no distension and no mass. There is tenderness. There is no rebound and no guarding.  Neurological: She is alert and oriented to person, place, and time.  Skin: Skin is warm and dry.    ED Course  Procedures (including critical care time)  Labs Reviewed - No data to display No results found.   1. Vomiting blood       MDM  Sent for eval of protracted vomiting with blood seen since 5am today. Not responding to phenergan, on meds for gerd.        Linna Hoff, MD 08/19/12 [phone]

## 2012-08-19 NOTE — ED Notes (Signed)
Pt sent here from [facility] for eval of vomiting blood since 0500.having chills/fever, denies diarrhea.

## 2012-08-19 NOTE — ED Notes (Signed)
Pt vomiting small amount of brown emesis.

## 2012-08-19 NOTE — ED Notes (Signed)
Pt c/o excessive vomiting that started at 5 a.m this morning.  Pt is also c/o headache and chills.  Denies any other symptoms

## 2012-08-19 NOTE — ED Provider Notes (Signed)
History     CSN: [id number]  Arrival date & time 08/19/12  1749   First MD Initiated Contact with Patient 08/19/12 1803      Chief Complaint  Patient presents with  . Emesis    (Consider location/radiation/quality/duration/timing/severity/associated sxs/prior treatment) Patient is a 78 y.o. female presenting with vomiting. The history is provided by the patient and a relative. No language interpreter was used.  Emesis Severity:  Moderate Duration:  1 day Timing:  Intermittent Quality:  Stomach contents (dark red material) Progression:  Partially resolved Chronicity:  New Recent urination:  Normal Context: not post-tussive and not self-induced   Relieved by:  Nothing Worsened by:  Nothing tried Ineffective treatments:  None tried Associated symptoms: headaches   Associated symptoms: no abdominal pain, no arthralgias, no chills, no cough, no diarrhea, no fever and no sore throat   Headaches:    Severity:  Mild   Onset quality:  Gradual   Duration:  1 day   Timing:  Constant   Progression:  Unchanged Risk factors: no alcohol use, no diabetes, no sick contacts and no suspect food intake     Past Medical History  Diagnosis Date  . GERD (gastroesophageal reflux disease)   . Low back pain   . Bronchitis   . Chronic hoarseness   . Sleep apnea, obstructive   . Renal artery stenosis   . PVD (peripheral vascular disease)   . Hypercholesteremia   . Pain in limb   . COPD (chronic obstructive pulmonary disease)   . Chronic headache   . Benign essential hypertension   . Atherosclerosis     coronary vessel type  . Coronary artery disease   . Diverticulosis   . History of GI diverticular bleed   . Myocardial infarct   . History of esophagitis   . H. pylori infection   . Altered mental status   . Pneumonia   . TIA (transient ischemic attack) 07/2010  . Arthritis   . GERD (gastroesophageal reflux disease)   . History of blood clots     Past Surgical History   Procedure Laterality Date  . Oophorectomy    . Coronary angioplasty with stent placement    . Abdominal hysterectomy      Family History  Problem Relation Age of Onset  . Heart disease Mother   . Stroke Mother   . Stomach cancer Mother   . Heart disease Father   . Stroke Father   . Heart disease Sister   . Colon cancer Neg Hx     History  Substance Use Topics  . Smoking status: Current Every Day Smoker -- 1.00 packs/day for 56 years    Types: Cigarettes  . Smokeless tobacco: Never Used  . Alcohol Use: No     Comment: occasional    OB History   Grav Para Term Preterm Abortions TAB SAB Ect Mult Living                  Review of Systems  Constitutional: Negative for fever, chills, diaphoresis, activity change, appetite change and fatigue.  HENT: Negative for congestion, sore throat, facial swelling, rhinorrhea, neck pain and neck stiffness.   Eyes: Negative for photophobia and discharge.  Respiratory: Negative for cough, chest tightness and shortness of breath.   Cardiovascular: Negative for chest pain, palpitations and leg swelling.  Gastrointestinal: Positive for vomiting and blood in stool. Negative for nausea, abdominal pain and diarrhea.  Endocrine: Negative for polydipsia and polyuria.  Genitourinary: Negative  for dysuria, frequency, difficulty urinating and pelvic pain.  Musculoskeletal: Negative for back pain and arthralgias.  Skin: Negative for color change and wound.  Allergic/Immunologic: Negative for immunocompromised state.  Neurological: Positive for headaches. Negative for facial asymmetry, weakness and numbness.  Hematological: Does not bruise/bleed easily.  Psychiatric/Behavioral: Negative for confusion and agitation.    Allergies  Review of patient's allergies indicates no known allergies.  Home Medications   Current Outpatient Rx  Name  Route  Sig  Dispense  Refill  . albuterol (PROAIR HFA) 108 (90 BASE) MCG/ACT inhaler   Inhalation   Inhale  2 puffs into the lungs.           . ALPRAZolam (XANAX) 0.5 MG tablet   Oral   Take 0.5 mg by mouth at bedtime as needed for sleep.          Marland Kitchen aspirin 81 MG EC tablet   Oral   Take 81 mg by mouth daily.           Marland Kitchen atorvastatin (LIPITOR) 80 MG tablet   Oral   Take 80 mg by mouth at bedtime.         Marland Kitchen azithromycin (ZITHROMAX) 250 MG tablet   Oral   Take 250 mg by mouth daily. Unknown about start and stop of med.         . cholecalciferol (VITAMIN D) 1000 UNITS tablet   Oral   Take 1,000 Units by mouth daily.         . clopidogrel (PLAVIX) 75 MG tablet   Oral   Take 75 mg by mouth daily.         Marland Kitchen gabapentin (NEURONTIN) 300 MG capsule   Oral   Take 300 mg by mouth at bedtime.         . isosorbide mononitrate (IMDUR) 60 MG 24 hr tablet   Oral   Take 60 mg by mouth daily.         . metoprolol tartrate (LOPRESSOR) 25 MG tablet   Oral   Take 12.5 mg by mouth 2 (two) times daily.         Marland Kitchen morphine (MS CONTIN) 30 MG 12 hr tablet   Oral   Take 30 mg by mouth. Every 12 hours as needed for pain          . Nutritional Supplements (COMPLETE PROTEIN/VITAMIN SHAKE PO)   Oral   Take 2-3 Bottles by mouth daily.         Marland Kitchen omeprazole (PRILOSEC) 20 MG capsule   Oral   Take 20 mg by mouth daily.         . promethazine (PHENERGAN) 25 MG tablet   Oral   Take 25 mg by mouth every 6 (six) hours as needed for nausea.         Marland Kitchen tiotropium (SPIRIVA) 18 MCG inhalation capsule   Inhalation   Place 18 mcg into inhaler and inhale daily.           BP 158/71  Pulse 85  Temp(Src) 98.8 F (37.1 C) (Oral)  Resp 18  SpO2 96%  Physical Exam  Constitutional: She is oriented to person, place, and time. She appears well-developed. No distress.  Thin  HENT:  Head: Normocephalic and atraumatic.  Mouth/Throat: No oropharyngeal exudate.  Eyes: Pupils are equal, round, and reactive to light.  Neck: Normal range of motion. Neck supple.  Cardiovascular: Normal  rate, regular rhythm and normal heart sounds.  Exam reveals no gallop  and no friction rub.   No murmur heard. Pulmonary/Chest: Effort normal and breath sounds normal. No respiratory distress. She has no wheezes. She has no rales.  Abdominal: Soft. Bowel sounds are normal. She exhibits no distension and no mass. There is no tenderness. There is no rebound and no guarding.  Genitourinary: Guaiac positive stool (brown stool).  Musculoskeletal: Normal range of motion. She exhibits no edema and no tenderness.  Neurological: She is alert and oriented to person, place, and time.  Skin: Skin is warm and dry.  Psychiatric: She has a normal mood and affect.    ED Course  Procedures (including critical care time)  Labs Reviewed  OCCULT BLOOD GASTRIC / DUODENUM - Abnormal; Notable for the following:    Occult Blood, Gastric POSITIVE (*)    All other components within normal limits  CBC WITH DIFFERENTIAL - Abnormal; Notable for the following:    Neutrophils Relative 84 (*)    Monocytes Relative 2 (*)    All other components within normal limits  COMPREHENSIVE METABOLIC PANEL - Abnormal; Notable for the following:    Potassium 3.4 (*)    Glucose, Bld 133 (*)    BUN 35 (*)    Total Bilirubin 0.2 (*)    GFR calc non Af Amer 89 (*)    All other components within normal limits  URINALYSIS, ROUTINE W REFLEX MICROSCOPIC - Abnormal; Notable for the following:    APPearance CLOUDY (*)    Hgb urine dipstick MODERATE (*)    Bilirubin Urine SMALL (*)    Ketones, ur 40 (*)    Protein, ur 30 (*)    All other components within normal limits  CBC - Abnormal; Notable for the following:    RBC 3.84 (*)    Hemoglobin 11.9 (*)    HCT 35.0 (*)    All other components within normal limits  OCCULT BLOOD, POC DEVICE - Abnormal; Notable for the following:    Fecal Occult Bld POSITIVE (*)    All other components within normal limits  URINE CULTURE  URINE MICROSCOPIC-ADD ON  CG4 I-STAT (LACTIC ACID)   No  results found.   1. Upper GI bleed       MDM  Pt is a 78 y.o. female with pertinent PMHX of GERD, COPD, HTN, MI, GERD, gastritis, diverticular bleed who presents with nausea and vomiting since about 5am this morning, with greater than 10 episodes of emesis with an unknown number having dark red material in the emesis.  Son reports she has also had occasional dark red blood in her stool several times this week, the last being 2 days ago.  No fever, chills, ab pain, urinary symptoms. Pt has chronically poor appetite.  On PE, T 100.2, HR 100, BP stable.  Pt is thin.  No ab ttp, lung clear.  Brown stool on rectal w/o evidence of bleeding hemorrhoid.  Pt had episode of emesis prior to my exam that was Gastroccult +, reported to be dark brown, but not seen by myself.  Ddx includes PUD, gastritis, gastroenteritis, diverticulosis, bleeding internal hemorrhoid.  Have ordered CBC, CMP, UA, LA.    Hb 13.6, K 3.4, BUN elevated at 35, Cr 0.56.  Will plan on 4hr delta Hb.  UA with moderate Hgb, ketones, but no nitrites or leukocytes.   Q 4hr hemoglobin with drop from 13.6 to 11.9.  Pt no longer vomiting, but given age, Hb drop, +hemoccult & gastrocult, I feel admission appropriate.  [facility] consulted and will  admit to step down. Have ordered 40mg  IV protonix.  1. Upper GI bleed     Labs and imaging considered in decision making, reviewed by myself.  Imaging interpreted by radiology. Pt care discussed with my attending, Dr. Vivia Ewing.    Toy Cookey, MD 08/20/12 0102  I saw and evaluated the patient, reviewed the resident's note and I agree with the findings and plan.  Pt with known severe gastritis and diverticular bleed comes in with hematemesis and guaiac positive stools. Serial CBC shows almost 2 grams of Hb drop - and so admission over close follow up favored. Protonix bolus given.  Derwood Kaplan, MD 08/20/12 1540

## 2012-08-20 ENCOUNTER — Encounter (HOSPITAL_COMMUNITY): Payer: Self-pay | Admitting: Internal Medicine

## 2012-08-20 DIAGNOSIS — I2510 Atherosclerotic heart disease of native coronary artery without angina pectoris: Secondary | ICD-10-CM | POA: Diagnosis present

## 2012-08-20 DIAGNOSIS — R112 Nausea with vomiting, unspecified: Secondary | ICD-10-CM | POA: Diagnosis present

## 2012-08-20 DIAGNOSIS — K922 Gastrointestinal hemorrhage, unspecified: Secondary | ICD-10-CM

## 2012-08-20 LAB — CBC
HCT: 34.1 % — ABNORMAL LOW (ref 36.0–46.0)
HCT: 34.6 % — ABNORMAL LOW (ref 36.0–46.0)
HCT: 30.6 % — ABNORMAL LOW (ref 36.0–46.0)
Hemoglobin: 11.4 g/dL — ABNORMAL LOW (ref 12.0–15.0)
MCH: 30.6 pg (ref 26.0–34.0)
MCH: 31 pg (ref 26.0–34.0)
MCHC: 32.9 g/dL (ref 30.0–36.0)
MCV: 92.2 fL (ref 78.0–100.0)
MCV: 93 fL (ref 78.0–100.0)
Platelets: 183 10*3/uL (ref 150–400)
RBC: 3.7 MIL/uL — ABNORMAL LOW (ref 3.87–5.11)
RBC: 3.72 MIL/uL — ABNORMAL LOW (ref 3.87–5.11)
RBC: 3.29 MIL/uL — ABNORMAL LOW (ref 3.87–5.11)
RDW: 13.8 % (ref 11.5–15.5)
WBC: 5.4 10*3/uL (ref 4.0–10.5)

## 2012-08-20 LAB — COMPREHENSIVE METABOLIC PANEL
ALT: 7 U/L (ref 0–35)
AST: 16 U/L (ref 0–37)
CO2: 25 mEq/L (ref 19–32)
Calcium: 8.7 mg/dL (ref 8.4–10.5)
Chloride: 108 mEq/L (ref 96–112)
GFR calc Af Amer: >90 mL/min (ref >90)
GFR calc non Af Amer: >90 mL/min (ref >90)
Glucose, Bld: 107 mg/dL — ABNORMAL HIGH (ref 70–99)
Sodium: 142 mEq/L (ref 135–145)
Total Bilirubin: 0.2 mg/dL — ABNORMAL LOW (ref 0.3–1.2)

## 2012-08-20 LAB — PROTIME-INR
INR: 1.08 (ref 0.00–1.49)
Prothrombin Time: 13.9 seconds (ref 11.6–15.2)

## 2012-08-20 LAB — BASIC METABOLIC PANEL
CO2: 25 mEq/L (ref 19–32)
Calcium: 8.8 mg/dL (ref 8.4–10.5)
Chloride: 111 mEq/L (ref 96–112)
Glucose, Bld: 106 mg/dL — ABNORMAL HIGH (ref 70–99)
Sodium: 142 mEq/L (ref 135–145)

## 2012-08-20 MED ORDER — MORPHINE SULFATE 30 MG PO TB12: 30.0000 mg | ORAL_TABLET | Freq: Two times a day (BID) | ORAL | Status: DC | PRN

## 2012-08-20 MED ORDER — ACETAMINOPHEN 650 MG RE SUPP: 650.0000 mg | Freq: Four times a day (QID) | RECTAL | Status: DC | PRN

## 2012-08-20 MED ORDER — OXYCODONE HCL 5 MG PO TABS
5.0000 mg | ORAL_TABLET | ORAL | Status: DC | PRN
  Administered 2012-08-20 (×2): 5 mg via ORAL
  Administered 2012-08-20 – 2012-08-23 (×9): 10 mg via ORAL
  Filled 2012-08-20 (×4): qty 2
  Filled 2012-08-20: qty 1
  Filled 2012-08-20: qty 2
  Filled 2012-08-20: qty 1
  Filled 2012-08-20 (×4): qty 2

## 2012-08-20 MED ORDER — TIOTROPIUM BROMIDE MONOHYDRATE 18 MCG IN CAPS
18.0000 ug | ORAL_CAPSULE | Freq: Every day | RESPIRATORY_TRACT | Status: DC
  Administered 2012-08-20 – 2012-08-23 (×4): 18 ug via RESPIRATORY_TRACT
  Filled 2012-08-20: qty 5

## 2012-08-20 MED ORDER — METOPROLOL TARTRATE 12.5 MG HALF TABLET
12.5000 mg | ORAL_TABLET | Freq: Two times a day (BID) | ORAL | Status: DC
  Administered 2012-08-20: 12.5 mg via ORAL
  Filled 2012-08-20 (×2): qty 1

## 2012-08-20 MED ORDER — ISOSORBIDE MONONITRATE ER 60 MG PO TB24
60.0000 mg | ORAL_TABLET | Freq: Every day | ORAL | Status: DC
  Administered 2012-08-20 – 2012-08-23 (×4): 60 mg via ORAL
  Filled 2012-08-20 (×4): qty 1

## 2012-08-20 MED ORDER — SODIUM CHLORIDE 0.9 % IJ SOLN
3.0000 mL | Freq: Two times a day (BID) | INTRAMUSCULAR | Status: DC
  Administered 2012-08-20 – 2012-08-23 (×6): 3 mL via INTRAVENOUS

## 2012-08-20 MED ORDER — NICOTINE 21 MG/24HR TD PT24
21.0000 mg | MEDICATED_PATCH | TRANSDERMAL | Status: DC
  Administered 2012-08-20 – 2012-08-22 (×3): 21 mg via TRANSDERMAL
  Filled 2012-08-20 (×4): qty 1

## 2012-08-20 MED ORDER — POTASSIUM CHLORIDE CRYS ER 20 MEQ PO TBCR
40.0000 meq | EXTENDED_RELEASE_TABLET | Freq: Once | ORAL | Status: AC
  Administered 2012-08-20: 40 meq via ORAL
  Filled 2012-08-20: qty 2

## 2012-08-20 MED ORDER — PANTOPRAZOLE SODIUM 40 MG IV SOLR
40.0000 mg | Freq: Two times a day (BID) | INTRAVENOUS | Status: DC
  Administered 2012-08-20 – 2012-08-21 (×3): 40 mg via INTRAVENOUS
  Filled 2012-08-20 (×4): qty 40

## 2012-08-20 MED ORDER — POTASSIUM CHLORIDE 10 MEQ/100ML IV SOLN
10.0000 meq | INTRAVENOUS | Status: AC
  Administered 2012-08-20 (×5): 10 meq via INTRAVENOUS
  Filled 2012-08-20 (×5): qty 100

## 2012-08-20 MED ORDER — ACETAMINOPHEN 325 MG PO TABS
650.0000 mg | ORAL_TABLET | Freq: Four times a day (QID) | ORAL | Status: DC | PRN
  Filled 2012-08-20: qty 2

## 2012-08-20 MED ORDER — ZOLPIDEM TARTRATE 5 MG PO TABS
5.0000 mg | ORAL_TABLET | Freq: Every evening | ORAL | Status: DC | PRN
  Administered 2012-08-20 – 2012-08-22 (×3): 5 mg via ORAL
  Filled 2012-08-20 (×4): qty 1

## 2012-08-20 MED ORDER — ONDANSETRON HCL 4 MG/2ML IJ SOLN
4.0000 mg | Freq: Four times a day (QID) | INTRAMUSCULAR | Status: DC | PRN
  Administered 2012-08-21: 4 mg via INTRAVENOUS
  Filled 2012-08-20: qty 2

## 2012-08-20 MED ORDER — METOPROLOL TARTRATE 12.5 MG HALF TABLET
12.5000 mg | ORAL_TABLET | Freq: Two times a day (BID) | ORAL | Status: DC
  Filled 2012-08-20 (×2): qty 1

## 2012-08-20 MED ORDER — SODIUM CHLORIDE 0.9 % IV SOLN
8.0000 mg/h | INTRAVENOUS | Status: DC
  Administered 2012-08-20: 8 mg/h via INTRAVENOUS
  Filled 2012-08-20 (×5): qty 80

## 2012-08-20 MED ORDER — ALBUTEROL SULFATE HFA 108 (90 BASE) MCG/ACT IN AERS: 2.0000 | INHALATION_SPRAY | RESPIRATORY_TRACT | Status: DC | PRN

## 2012-08-20 MED ORDER — ALPRAZOLAM 0.5 MG PO TABS: 0.5000 mg | ORAL_TABLET | Freq: Every evening | ORAL | Status: DC | PRN

## 2012-08-20 MED ORDER — PANTOPRAZOLE SODIUM 40 MG IV SOLR
40.0000 mg | Freq: Once | INTRAVENOUS | Status: AC
  Administered 2012-08-20: 40 mg via INTRAVENOUS
  Filled 2012-08-20: qty 40

## 2012-08-20 MED ORDER — MORPHINE SULFATE ER 15 MG PO TBCR
30.0000 mg | EXTENDED_RELEASE_TABLET | Freq: Two times a day (BID) | ORAL | Status: DC | PRN
  Administered 2012-08-20 (×2): 30 mg via ORAL
  Filled 2012-08-20 (×2): qty 2

## 2012-08-20 MED ORDER — METOPROLOL TARTRATE 25 MG PO TABS
25.0000 mg | ORAL_TABLET | Freq: Two times a day (BID) | ORAL | Status: DC
  Administered 2012-08-20 – 2012-08-23 (×6): 25 mg via ORAL
  Filled 2012-08-20 (×7): qty 1

## 2012-08-20 MED ORDER — MORPHINE SULFATE 2 MG/ML IJ SOLN
1.0000 mg | INTRAMUSCULAR | Status: DC | PRN
  Administered 2012-08-23: 2 mg via INTRAVENOUS
  Filled 2012-08-20: qty 1

## 2012-08-20 MED ORDER — SODIUM CHLORIDE 0.9 % IV SOLN
INTRAVENOUS | Status: DC
  Administered 2012-08-20 (×2): via INTRAVENOUS
  Administered 2012-08-21: 75 mL/h via INTRAVENOUS
  Administered 2012-08-21 – 2012-08-22 (×2): via INTRAVENOUS

## 2012-08-20 MED ORDER — ONDANSETRON HCL 4 MG PO TABS
4.0000 mg | ORAL_TABLET | Freq: Four times a day (QID) | ORAL | Status: DC | PRN
  Administered 2012-08-20: 4 mg via ORAL
  Filled 2012-08-20: qty 1

## 2012-08-20 NOTE — ED Notes (Signed)
Ervin Knack- daughter- [phone]. Call if you need anything for Pt.Marland Kitchen

## 2012-08-20 NOTE — H&P (Addendum)
[facility] History and Physical  Kathy Marshall:[id number] DOB: 12/05/1934 DOA: 08/19/2012  Referring physician: ER physician. PCP: Leo Grosser, MD  Specialists: Dr. Juanda Chance of about GI.  Chief Complaint: Nausea vomiting.  HPI: Kathy Marshall is a 78 y.o. female with history of CAD status post stenting, COPD, hypertension, ongoing tobacco abuse presented to the year because of nausea vomiting. Patient states since yesterday morning patient has had at least 3 episodes that she had thrown up dark colored coffee-ground-type vomitus. In the ER patient was stool for occult blood positive. Patient states that prior to each episode patient felt crampy pain abdomen after which he threw up. Denies having any diarrhea or blood in the stools. Patient takes aspirin and Plavix for her CAD but denies taking any NSAIDs. In the ER repeat hemoglobin showed a 2 g drop from 13 -11. Patient at this time will be admitted for further management. EGD and colonoscopy done 2 years ago was showing severe gastritis with stenosis in the duodenum. Coloscopy was just showing polyp.  Review of Systems: The patient denies anorexia, fever, weight loss,, vision loss, decreased hearing, hoarseness, chest pain, syncope, dyspnea on exertion, peripheral edema, balance deficits, hemoptysis, melena, hematochezia, severe indigestion/heartburn, hematuria, incontinence, genital sores, muscle weakness, suspicious skin lesions, transient blindness, difficulty walking, depression, unusual weight change, abnormal bleeding, enlarged lymph nodes, angioedema, and breast masses. Nausea vomiting with coffee-ground vomitus. Patient also has abdominal discomfort with each vomitus.  Past Medical History  Diagnosis Date  . GERD (gastroesophageal reflux disease)   . Low back pain   . Bronchitis   . Chronic hoarseness   . Sleep apnea, obstructive   . Renal artery stenosis   . PVD (peripheral vascular disease)   .  Hypercholesteremia   . Pain in limb   . COPD (chronic obstructive pulmonary disease)   . Chronic headache   . Benign essential hypertension   . Atherosclerosis     coronary vessel type  . Coronary artery disease   . Diverticulosis   . History of GI diverticular bleed   . Myocardial infarct   . History of esophagitis   . H. pylori infection   . Altered mental status   . Pneumonia   . TIA (transient ischemic attack) 07/2010  . Arthritis   . GERD (gastroesophageal reflux disease)   . History of blood clots    Past Surgical History  Procedure Laterality Date  . Oophorectomy    . Coronary angioplasty with stent placement    . Abdominal hysterectomy    . Cholecystectomy     Social History:  reports that she has been smoking Cigarettes.  She has a 56 pack-year smoking history. She has never used smokeless tobacco. She reports that she does not drink alcohol or use illicit drugs. Patient lives with her daughter at home. where does patient live--home, ALF, SNF? and with whom if at home? Can usually do ADLs. Can patient participate in ADLs?  No Known Allergies  Family History  Problem Relation Age of Onset  . Heart disease Mother   . Stroke Mother   . Stomach cancer Mother   . Heart disease Father   . Stroke Father   . Heart disease Sister   . Colon cancer Neg Hx       Prior to Admission medications   Medication Sig Start Date End Date Taking? Authorizing Provider  albuterol (PROAIR HFA) 108 (90 BASE) MCG/ACT inhaler Inhale 2 puffs into the lungs.  Yes Historical Provider, MD  ALPRAZolam Prudy Feeler) 0.5 MG tablet Take 0.5 mg by mouth at bedtime as needed for sleep.    Yes Historical Provider, MD  aspirin 81 MG EC tablet Take 81 mg by mouth daily.     Yes Historical Provider, MD  atorvastatin (LIPITOR) 80 MG tablet Take 80 mg by mouth at bedtime.   Yes Historical Provider, MD  azithromycin (ZITHROMAX) 250 MG tablet Take 250 mg by mouth daily. Unknown about start and stop of med.    Yes Historical Provider, MD  cholecalciferol (VITAMIN D) 1000 UNITS tablet Take 1,000 Units by mouth daily.   Yes Historical Provider, MD  clopidogrel (PLAVIX) 75 MG tablet Take 75 mg by mouth daily.   Yes Historical Provider, MD  gabapentin (NEURONTIN) 300 MG capsule Take 300 mg by mouth at bedtime.   Yes Historical Provider, MD  isosorbide mononitrate (IMDUR) 60 MG 24 hr tablet Take 60 mg by mouth daily.   Yes Historical Provider, MD  metoprolol tartrate (LOPRESSOR) 25 MG tablet Take 12.5 mg by mouth 2 (two) times daily.   Yes Historical Provider, MD  morphine (MS CONTIN) 30 MG 12 hr tablet Take 30 mg by mouth. Every 12 hours as needed for pain    Yes Historical Provider, MD  Nutritional Supplements (COMPLETE PROTEIN/VITAMIN SHAKE PO) Take 2-3 Bottles by mouth daily.   Yes Historical Provider, MD  omeprazole (PRILOSEC) 20 MG capsule Take 20 mg by mouth daily.   Yes Historical Provider, MD  promethazine (PHENERGAN) 25 MG tablet Take 25 mg by mouth every 6 (six) hours as needed for nausea.   Yes Historical Provider, MD  tiotropium (SPIRIVA) 18 MCG inhalation capsule Place 18 mcg into inhaler and inhale daily.   Yes Historical Provider, MD   Physical Exam: Filed Vitals:   08/19/12 2100 08/19/12 2200 08/19/12 2300 08/20/12 0015  BP: 157/75 164/67 162/74 158/71  Pulse: 86 98 87 85  Temp:      TempSrc:      Resp:      SpO2: 96% 96% 94% 96%     General:  Well-developed and moderately nourished.  Eyes: Anicteric no pallor.  ENT: No discharge from ears eyes nose and mouth.  Neck: No mass felt.  Cardiovascular: S1-S2 heard.  Respiratory: No rhonchi no crepitations.  Abdomen: Soft nontender bowel sounds present. No guarding or rigidity.  Skin: No rash.  Musculoskeletal: Nontender.  Psychiatric: Appears normal.  Neurologic: Moves all extremities.  Labs on Admission:  Basic Metabolic Panel:  Recent Labs Lab 08/19/12 1931  NA 140  K 3.4*  CL 102  CO2 26  GLUCOSE 133*   BUN 35*  CREATININE 0.56  CALCIUM 9.2   Liver Function Tests:  Recent Labs Lab 08/19/12 1931  AST 17  ALT 9  ALKPHOS 87  BILITOT 0.2*  PROT 7.6  ALBUMIN 3.6   No results found for this basename: LIPASE, AMYLASE,  in the last 168 hours No results found for this basename: AMMONIA,  in the last 168 hours CBC:  Recent Labs Lab 08/19/12 1931 08/19/12 2343  WBC 6.1 5.1  NEUTROABS 5.1  --   HGB 13.6 11.9*  HCT 38.8 35.0*  MCV 90.2 91.1  PLT 225 196   Cardiac Enzymes: No results found for this basename: CKTOTAL, CKMB, CKMBINDEX, TROPONINI,  in the last 168 hours  BNP (last 3 results) No results found for this basename: PROBNP,  in the last 8760 hours CBG: No results found for this basename: GLUCAP,  in the last 168 hours  Radiological Exams on Admission: No results found.   Assessment/Plan Principal Problem:   GI bleed Active Problems:   COPD   Nausea and vomiting   CAD (coronary artery disease)   1. GI bleed - most likely upper GI bleed. At this time patient has been kept n.p.o. Protonix infusion has been started. Check CBC frequently and transfuse as needed. Consult GI. Presently hemodynamically stable. 2. CAD status post stenting - due to acute GI bleed antiplatelet agents have been held. Please discuss with patient's cardiologist in a.m. Dr. Nicki Guadalajara. Records show patient had cardiac stents in 2006 and repeat cardiac catheter in 2008. 3.  COPD - presently not wheezing. 4. Hyperlipidemia - continue statins once patient start taking orally. 5. Ongoing tobacco abuse - strongly advised to quit smoking. 6. Chronic pain - continue present medications. 7. Recently treated for bronchitis.    Code Status: Full code. Family Communication: None at the bedside.  Disposition Plan: Admit to inpatient.   Payden Bonus N. [facility] Pager [phone].  If 7PM-7AM, please contact night-coverage [email or web address] Password [email or web address] 08/20/2012, 1:39  AM

## 2012-08-20 NOTE — Consult Note (Signed)
[facility] Consult: 2:14 PM 08/20/2012   Referring Provider: Virgina Evener  Primary Care Physician:  Leo Grosser, MD Primary Gastroenterologist:  Dr. [name]   Reason for Consultation:  Hemetemesis.   HPI: Kathy Marshall is a 78 y.o. female.  Smoker with hx CAD and cardiac stents. COPD. OSA. Takes Plavix.  Admitted 2/14 with N/V.  On Friday 2/21 had single episode of gross hematuria, it has not recurred.  Had no dysuria, frequency etc. 1 AM 2/22 had first of about 6 or so episodes of coffee ground and slightly burgundy emesis.  No abdominal pain.   She takes 3 Goodie's powders daily in addition to Morphine BID and Percocet tid.  Pain is in the low back and she gets frequent headaches.  Appetite poor for at least 18 months, progressive weight loss:  150 # 2009, 102 # 01/2011, current is also 102 #.  No early satiety, just no appetite.  No abdominal pain.  Generally not having nausea.  Does see painless blood per rectum on occasion. Denies constipation or straining. No dysphagia. rx'd Megace in 2012 but unable to afford this and Medicaid did not pay for this med.   On EGDs in 2007 and 2012 had gastritis, duodenitis.  Duodenal stricture in 2012.  Takes once daily Omeprazole.     Past Medical History  Diagnosis Date  . GERD (gastroesophageal reflux disease)   . Low back pain   . Bronchitis   . Chronic hoarseness   . Sleep apnea, obstructive   . Renal artery stenosis   . PVD (peripheral vascular disease)   . Hypercholesteremia   . Pain in limb   . COPD (chronic obstructive pulmonary disease)   . Chronic headache   . Benign essential hypertension   . Atherosclerosis     coronary vessel type  . Coronary artery disease   . Diverticulosis   . History of GI diverticular bleed   . Myocardial infarct   . History of esophagitis   . H. pylori infection   . Altered mental status   . Pneumonia   . TIA (transient  ischemic attack) 07/2010  . Arthritis   . GERD (gastroesophageal reflux disease)   . History of blood clots     Past Surgical History  Procedure Laterality Date  . Oophorectomy    . Coronary angioplasty with stent placement    . Abdominal hysterectomy    . Cholecystectomy      Prior to Admission medications   Medication Sig Start Date End Date Taking? Authorizing Provider  albuterol (PROAIR HFA) 108 (90 BASE) MCG/ACT inhaler Inhale 2 puffs into the lungs.     Yes Historical Provider, MD  ALPRAZolam Prudy Feeler) 0.5 MG tablet Take 0.5 mg by mouth at bedtime as needed for sleep.    Yes Historical Provider, MD  Goodie's powder Tid.    Yes Historical Provider, MD  atorvastatin (LIPITOR) 80 MG tablet Take 80 mg by mouth at bedtime.   Yes Historical Provider, MD  azithromycin (ZITHROMAX) 250 MG tablet Take 250 mg by mouth daily. Unknown about start and stop of med.   Yes Historical Provider, MD  cholecalciferol (VITAMIN D) 1000 UNITS tablet Take 1,000 Units by mouth daily.   Yes Historical Provider, MD  clopidogrel (PLAVIX) 75 MG tablet Take 75 mg by mouth daily.   Yes Historical Provider, MD  gabapentin (NEURONTIN) 300 MG capsule Take 300 mg by mouth at bedtime.   Yes Historical Provider, MD  isosorbide mononitrate (IMDUR) 60  MG 24 hr tablet Take 60 mg by mouth daily.   Yes Historical Provider, MD  metoprolol tartrate (LOPRESSOR) 25 MG tablet Take 12.5 mg by mouth 2 (two) times daily.   Yes Historical Provider, MD  morphine (MS CONTIN) 30 MG 12 hr tablet Take 30 mg by mouth. Every 12 hours as needed for pain    Yes Historical Provider, MD  Nutritional Supplements (COMPLETE PROTEIN/VITAMIN SHAKE PO) Take 2-3 Bottles by mouth daily.   Yes Historical Provider, MD  omeprazole (PRILOSEC) 20 MG capsule Take 20 mg by mouth daily.   Yes Historical Provider, MD  promethazine (PHENERGAN) 25 MG tablet Take 25 mg by mouth every 6 (six) hours as needed for nausea.   Yes Historical Provider, MD  tiotropium  (SPIRIVA) 18 MCG inhalation capsule Place 18 mcg into inhaler and inhale daily.   Yes Historical Provider, MD    Scheduled Meds: . isosorbide mononitrate  60 mg Oral Daily  . metoprolol tartrate  25 mg Oral BID  . sodium chloride  3 mL Intravenous Q12H  . tiotropium  18 mcg Inhalation Daily   Infusions: . sodium chloride 75 mL/hr at 08/20/12 1325  . pantoprozole (PROTONIX) infusion 8 mg/hr (08/20/12 0504)   PRN Meds: acetaminophen, acetaminophen, albuterol, morphine, morphine injection, ondansetron (ZOFRAN) IV, ondansetron, oxyCODONE, zolpidem   Allergies as of 08/19/2012  . (No Known Allergies)    Family History  Problem Relation Age of Onset  . Heart disease Mother   . Stroke Mother   . Stomach cancer Mother   . Heart disease Father   . Stroke Father   . Heart disease Sister   . Colon cancer Neg Hx     History   Social History  . Marital Status: Divorced    Spouse Name: N/A    Number of Children: 6  . Years of Education: N/A   Occupational History  . RETIRED    Social History Main Topics  . Smoking status: Current Every Day Smoker -- 1.00 packs/day for 56 years.  Lives with a smoker tool     Types: Cigarettes  . Smokeless tobacco: Never Used  . Alcohol Use: No     Comment: hardly ever.   . Drug Use: No  . Sexually Active: Not on file   Other Topics Concern  . Not on file   Social History Narrative   2-3 cups coffee per day Lives with female partner of > 20 years.     REVIEW OF SYSTEMS: Constitutional:  Weight loss is ongoing.  Has gone from size 14 to size 6 in last 2 years or so. ENT:  No nose bleeds Pulm:  No new SOB.  Chronic, occasionally productive cough. Non-compliant with CPAP CV:  No chest pain, no palps, no pedal edema.  Cardiologist has not allowed her to go through with back surgery due to heart disease.  GU:  Hematuria per HPI. GI:  HPI Heme:  Not on any iron or b12 supplements.  .    Transfusions:  No prior transfusions Neuro:   Frequent headaches, occasional tingling numbness in feet.  MS:  Chronic low back pain.  Derm:  Has 09/2012 appt with derm for eval of lesion on right ear that MD feels might be skin cancer.  Endocrine:  No excessive thirst.  No sweats or chills Immunization:  Flu vaccine up to date Travel:  None.   PHYSICAL EXAM: Vital signs in last 24 hours: Temp:  [98.2 F (36.8 C)-100.2 F (37.9 C)]  98.2 F (36.8 C) (02/24 1138) Pulse Rate:  [71-100] 71 (02/24 1138) Resp:  [16-20] 16 (02/24 1138) BP: (118-172)/(52-92) 118/92 mmHg (02/24 1138) SpO2:  [93 %-97 %] 96 % (02/24 1138) Weight:  [46.4 kg (102 lb 4.7 oz)] 46.4 kg (102 lb 4.7 oz) (02/24 0252)  General: chronically unwell thin, osteoporotic looking WF.  Comfortable.  Smells of tobacco Head:  No asymmetry or facial edema  Eyes:  No icterus, no conj pallor Ears:  Slight HOH  Nose:  No discharge or bleeding Mouth:  Full dentures.  Moist, clear oral MM Neck:  No mass or TMG.  No JVD Lungs:  Clear B.  No labored breathing.  Vocal quality is harsh, hoarse c/w smoking hx. Heart: RRR.  No MRG Abdomen:  Soft, thin.   Rectal: dark, hard stool in vault is FOB +.  Small external hemorrhoid visible   Musc/Skeltl: kyphotic spine, no joint swelling or redness. Extremities:  No pedal edema.  Feet warm  Neurologic:  No tremor.  Oriented x 3.  Moves all 4 limbs, strength not tested. Skin:  No telangectasia.  No rash.  Scabbed small lesion on right ear is not bleeding Tattoos:  none Nodes:  No inguinal adenopathy.   Psych:  Pleasant, flat affect.   Intake/Output from previous day: 02/23 0701 - 02/24 0700 In: 275 [I.V.:275] Out: 300 [Urine:300] Intake/Output this shift: Total I/O In: 1200 [I.V.:700; IV Piggyback:500] Out: 551 [Urine:550; Stool:1]  LAB RESULTS:  Recent Labs  08/19/12 1931 08/19/12 2343 08/20/12 0520  WBC 6.1 5.1 5.4  5.5  HGB 13.6 11.9* 11.3*  11.4*  HCT 38.8 35.0* 34.1*  34.6*  PLT 225 196 204  199   BMET Lab  Results  Component Value Date   NA 142 08/20/2012   NA 140 08/19/2012   NA 144 04/04/2011   K 2.8* 08/20/2012   K 3.4* 08/19/2012   K 3.2* 04/04/2011   CL 108 08/20/2012   CL 102 08/19/2012   CL 108 04/04/2011   CO2 25 08/20/2012   CO2 26 08/19/2012   CO2 26 04/04/2011   GLUCOSE 107* 08/20/2012   GLUCOSE 133* 08/19/2012   GLUCOSE 109* 04/04/2011   BUN 24* 08/20/2012   BUN 35* 08/19/2012   BUN 29* 04/04/2011   CREATININE 0.50 08/20/2012   CREATININE 0.56 08/19/2012   CREATININE 0.57 04/04/2011   CALCIUM 8.7 08/20/2012   CALCIUM 9.2 08/19/2012   CALCIUM 8.8 04/04/2011   LFT  Recent Labs  08/19/12 1931 08/20/12 0520  PROT 7.6 6.7  ALBUMIN 3.6 3.1*  AST 17 16  ALT 9 7  ALKPHOS 87 73  BILITOT 0.2* 0.2*   PT/INR Lab Results  Component Value Date   INR 0.84 08/09/2010   INR 1.0 03/19/2009    RADIOLOGY STUDIES: No results found.  ENDOSCOPIC STUDIES: 02/2011  EGD Irregular Z-line. Severe gastritis.  Mild fibrotic stricture at duod bulb and descending duodenum, scope passed easily, mild duodenitis.    02/2011 Colonoscopy Sessile polyp at 10 cm removed.    Pathology 2012 1. Surgical [P], duodenum, bx - REACTIVE ANTRAL AND SMALL BOWEL TYPE MUCOSA. - NO DYSPLASIA OR MALIGNANCY IDENTIFIED. - SEE COMMENT. 2. Surgical [P], stomach, bx - GASTRIC ANTRAL MUCOSA WITH INTESTINAL EPITHELIUM. - WARTHIN-STARRY STAIN IS NEGATIVE FOR HELICOBACTER PYLORI. - NO DYSPLASIA OR MALIGNANCY IDENTIFIED. - SEE COMMENT. 3. Surgical [P], GE junction, bx - SQUAMOCOLUMNAR JUNCTION MUCOSA SHOWING CHRONIC INFLAMMATION AND CHANGES CONSISTENT WITH REFLUX ESOPHAGITIS. - NO INTESTINAL METAPLASIA, DYSPLASIA OR MALIGNANCY IDENTIFIED.  4. Surgical [P], colon at 10 cm, polyp - TUBULAR ADENOMA (X1). - NO HIGH GRADE DYSPLASIA OR MALIGNANCY IDENTIFIED.  08/2005   EGD Esophagitis, gastritis.   08/2005  Colonoscopy Left sided diverticulosis, with residual blood in sigmoid. suboptimal prep  Pathology     2007 1. SMALL  BOWEL, BIOPSIES: BENIGN SMALL BOWEL MUCOSA. NO VILLOUS ATROPHY, ACTIVE INFLAMMATION OR GRANULOMAS. 2. GASTRIC BIOPSIES: CHRONIC ACTIVE GASTRITIS WITH HELICOBACTER PYLORI. NO DYSPLASIA OR CARCINOMA IDENTIFIED. 3. ESOPHAGUS, BIOPSIES: INFLAMED SQUAMOUS MUCOSA. NO INTESTINAL METAPLASIA, DYSPLASIA OR CARCINOMA IDENTIFIED. 4. COLON, RANDOM BIOPSIES: BENIGN COLONIC MUCOSA. NO ACTIVE INFLAMMATION, MICROSCOPIC COLITIS, GRANULOMAS OR CHRONIC CHANGES.  IMPRESSION: *  Coffee ground emesis in pt with hx recurrent esophagitis, gastritis, duodeniitis who smokes and usestid Goddie's.  Suspect uncontrolled GERD vs ulcer disease.  This is associated with FOB + stool.  *  Anorexia, weight loss.  *  CAD.  Hx cardiac stents.  Chronic Plavix *  COPD.  Still 1ppd smoker.  *  hypokalemia.  *  Hematuria on 2/21.  *  Chronic pain and chronic narcotics as well as chronic benzos.   PLAN: *  EGD tomorrow afternoon.  Clears tonight.  I have reviewed the above note, examined the patient and agree with plan of treatment.Low volume hematemesis, hemodynamically stable. She uses excess ASA and continues to smoke in the setting of prior stricture in the esophagus and in the duodenum. Her initial BUN 35 has come down. We will proceed with EGD tomorrow, will recheck H.Pylori.Educating  Patient will be very important in preventing recurrence.  [facility] Pager # [phone]    LOS: 1 day   Jennye Moccasin  08/20/2012, 2:14 PM Pager: [phone]

## 2012-08-20 NOTE — Consult Note (Addendum)
I have seen and evaluated the patient this AM along with Nada Boozer, NP. I agree with her findings, examination as well as impression recommendations.  No active cardiac issues & not seen in our office for > 1 yr.  I agree with 78 yr old stents, her Plavix can be stopped indefinitely & would defer to Dr. Tresa Endo re: timing of & if we do at all restart  -- but would wait at least 1 month.  I anticipate that she will be d/c's on a PPI, as she may well go back on Plavix, would recommend converting to Protonix or Dexilant to avoid Plavix interaction.  Has CAD & h/o SVT - frequent PVCs on monitor -- will increase BB dose to 25mg  bid (cardioselective, so should not affect wheezing noted on exam). Was also on Diltiazem previously for this reason.  Will monitor, & be available.  Will sign off for now.  Please call with questions.  Marykay Lex, M.D., M.S. [facility] [address] [place], [place]  [address]  [phone] Pager # [phone] 08/20/2012 9:43 AM

## 2012-08-20 NOTE — Progress Notes (Signed)
INITIAL NUTRITION ASSESSMENT  DOCUMENTATION CODES Per approved criteria  -Not Applicable   INTERVENTION: Diet advancement per MD discretion. Recommend Ensure Complete po BID, each supplement provides 350 kcal and 13 grams of protein, once diet permits. RD to continue to follow nutrition care plan.  NUTRITION DIAGNOSIS: Inadequate oral intake related to inability to eat as evidenced by NPO status.   Goal: Intake to meet >90% of estimated nutrition needs.  Monitor:  weight trends, lab trends, I/O's, diet advancement  Reason for Assessment: Malnutrition Screening  78 y.o. female  Admitting Dx: GI bleed  ASSESSMENT: Admitted with n/v. Work-up reveals upper GIB. Pt reports that she had at least 3 episodes of coffee ground n/v PTA. EGD and colonoscopy done 2 years ago showed severe gastritis with stenosis in the duodenum. Coloscopy just showed a polyp. Remains NPO at this time.  Pt reports to this RD that she weighed approximately 165 lb last year, however this is not consistent with EPIC weight hx. She notes that in the past 3-4 months her intake has worsened, however she has incorporated at least 2 Ensures into her daily nutrition regimen which has helped her keep her weight stable, per her report. Agreeable to receiving oral nutrition supplements once diet allows.  Height: Ht Readings from Last 1 Encounters:  08/20/12 5\' 2"  (1.575 m)    Weight: Wt Readings from Last 1 Encounters:  08/20/12 102 lb 4.7 oz (46.4 kg)    Ideal Body Weight: 110 lb   % Ideal Body Weight: 93%  Wt Readings from Last 10 Encounters:  08/20/12 102 lb 4.7 oz (46.4 kg)  03/03/11 102 lb (46.267 kg)  02/02/11 102 lb (46.267 kg)  07/25/07 150 lb (68.04 kg)    Usual Body Weight: 102 lb  % Usual Body Weight: 100%  BMI:  Body mass index is 18.7 kg/(m^2). Normal weight  Estimated Nutritional Needs: Kcal: 1400 - 1600  Protein: 60 - 70 grams Fluid: at least 1.4 liters  Skin: intact  Diet Order:  NPO  EDUCATION NEEDS: -No education needs identified at this time   Intake/Output Summary (Last 24 hours) at 08/20/12 1208 Last data filed at 08/20/12 1100  Gross per 24 hour  Intake   1075 ml  Output    851 ml  Net    224 ml    Last BM: 2/22  Labs:   Recent Labs Lab 08/19/12 1931 08/20/12 0520  NA 140 142  K 3.4* 2.8*  CL 102 108  CO2 26 25  BUN 35* 24*  CREATININE 0.56 0.50  CALCIUM 9.2 8.7  GLUCOSE 133* 107*    CBG (last 3)  No results found for this basename: GLUCAP,  in the last 72 hours  Scheduled Meds: . isosorbide mononitrate  60 mg Oral Daily  . metoprolol tartrate  25 mg Oral BID  . potassium chloride  10 mEq Intravenous Q1 Hr x 5  . sodium chloride  3 mL Intravenous Q12H  . tiotropium  18 mcg Inhalation Daily    Continuous Infusions: . sodium chloride 75 mL/hr at 08/20/12 0411  . pantoprozole (PROTONIX) infusion 8 mg/hr (08/20/12 0504)    Past Medical History  Diagnosis Date  . GERD (gastroesophageal reflux disease)   . Low back pain   . Bronchitis   . Chronic hoarseness   . Sleep apnea, obstructive   . Renal artery stenosis   . PVD (peripheral vascular disease)   . Hypercholesteremia   . Pain in limb   . COPD (  chronic obstructive pulmonary disease)   . Chronic headache   . Benign essential hypertension   . Atherosclerosis     coronary vessel type  . Coronary artery disease   . Diverticulosis   . History of GI diverticular bleed   . Myocardial infarct   . History of esophagitis   . H. pylori infection   . Altered mental status   . Pneumonia   . TIA (transient ischemic attack) 07/2010  . Arthritis   . GERD (gastroesophageal reflux disease)   . History of blood clots     Past Surgical History  Procedure Laterality Date  . Oophorectomy    . Coronary angioplasty with stent placement    . Abdominal hysterectomy    . Cholecystectomy      Jarold Motto MS, RD, LDN Pager: [phone] After-hours pager: [phone]

## 2012-08-20 NOTE — Progress Notes (Signed)
Utilization Review Completed. 08/20/2012

## 2012-08-20 NOTE — Consult Note (Signed)
Reason for Consult:GI Bleed on Plavix.  Referring Physician: Dr. Randol Kern is an 78 y.o. female.    Chief Complaint: Pt admitted 08/20/12 with nausea and vomiting -coffee ground emesis   HPI:  78 y.o. female with history of CAD status post stenting-in 2006 to prox. and mid RCA with DES Cypher stents (last cath 2008 with non obstructive CAD and patent stents), COPD, hypertension, ongoing tobacco abuse presented to the ER because of nausea, vomiting. Patient stated since yesterday morning patient has had at least 3 episodes that she had thrown up dark colored coffee-ground-type vomitus. In the ER patient's + stool for occult blood. Patient stated that prior to each episode patient felt crampy pain abdomen after which she threw up. Denied having any diarrhea or blood in the stools. Patient takes aspirin and Plavix for her CAD but denies taking any NSAIDs. In the ER repeat hemoglobin showed a 2 g drop from 13 -11. Patient was admitted for further management. EGD and colonoscopy done 2 years ago showed severe gastritis with stenosis in the duodenum. Coloscopy just showed a polyp.   Troponin I neg, denies any chest pain or SOB.  Previous history or PSVT for which she has been on cardizem.  EKG SR with occ to freq PVCs. Last Echo 10/20/2011:  Left ventricular size was normal. - Overall left ventricular systolic function was normal. - Left ventricular ejection fraction was estimated , range being 55 % to 65 %.. - Left ventricular wall thickness was at the upper limits of Normal- mild concentric LVH. RV systolic pressure 30-40 mmHg. Mild pul. HTN.  Mild to moderate TR.  Last Lexican Myoview:  12/19/07-- EF 89%  Low risk scan.   ABI's for leg pain in 11/2007 Rt 1.08; Lt 1.06.   Past Medical History  Diagnosis Date  . GERD (gastroesophageal reflux disease)   . Low back pain   . Bronchitis   . Chronic hoarseness   . Sleep apnea, obstructive   . Renal artery stenosis   .  PVD (peripheral vascular disease)   . Hypercholesteremia   . Pain in limb   . COPD (chronic obstructive pulmonary disease)   . Chronic headache   . Benign essential hypertension   . Atherosclerosis     coronary vessel type  . Coronary artery disease   . Diverticulosis   . History of GI diverticular bleed   . Myocardial infarct   . History of esophagitis   . H. pylori infection   . Altered mental status   . Pneumonia   . TIA (transient ischemic attack) 07/2010  . Arthritis   . GERD (gastroesophageal reflux disease)   . History of blood clots     Past Surgical History  Procedure Laterality Date  . Oophorectomy    . Coronary angioplasty with stent placement    . Abdominal hysterectomy    . Cholecystectomy      Family History  Problem Relation Age of Onset  . Heart disease Mother   . Stroke Mother   . Stomach cancer Mother   . Heart disease Father   . Stroke Father   . Heart disease Sister   . Colon cancer Neg Hx    Social History:  reports that she has been smoking Cigarettes.  She has a 56 pack-year smoking history. She has never used smokeless tobacco. She reports that she does not drink alcohol or use illicit drugs.  Allergies: No Known Allergies  Medications Prior to Admission  Medication Sig Dispense Refill  . albuterol (PROAIR HFA) 108 (90 BASE) MCG/ACT inhaler Inhale 2 puffs into the lungs.        . ALPRAZolam (XANAX) 0.5 MG tablet Take 0.5 mg by mouth at bedtime as needed for sleep.       Marland Kitchen aspirin 81 MG EC tablet Take 81 mg by mouth daily.        Marland Kitchen atorvastatin (LIPITOR) 80 MG tablet Take 80 mg by mouth at bedtime.      Marland Kitchen azithromycin (ZITHROMAX) 250 MG tablet Take 250 mg by mouth daily. Unknown about start and stop of med.      . cholecalciferol (VITAMIN D) 1000 UNITS tablet Take 1,000 Units by mouth daily.      . clopidogrel (PLAVIX) 75 MG tablet Take 75 mg by mouth daily.      Marland Kitchen gabapentin (NEURONTIN) 300 MG capsule Take 300 mg by mouth at bedtime.      .  isosorbide mononitrate (IMDUR) 60 MG 24 hr tablet Take 60 mg by mouth daily.      . metoprolol tartrate (LOPRESSOR) 25 MG tablet Take 12.5 mg by mouth 2 (two) times daily.      Marland Kitchen morphine (MS CONTIN) 30 MG 12 hr tablet Take 30 mg by mouth. Every 12 hours as needed for pain       . Nutritional Supplements (COMPLETE PROTEIN/VITAMIN SHAKE PO) Take 2-3 Bottles by mouth daily.      Marland Kitchen omeprazole (PRILOSEC) 20 MG capsule Take 20 mg by mouth daily.      . promethazine (PHENERGAN) 25 MG tablet Take 25 mg by mouth every 6 (six) hours as needed for nausea.      Marland Kitchen tiotropium (SPIRIVA) 18 MCG inhalation capsule Place 18 mcg into inhaler and inhale daily.        Results for orders placed during the hospital encounter of 08/19/12 (from the past 48 hour(s))  OCCULT BLOOD GASTRIC / DUODENUM     Status: Abnormal   Collection Time    08/19/12  7:03 PM      Result Value Range   Occult Blood, Gastric POSITIVE (*) NEGATIVE  CBC WITH DIFFERENTIAL     Status: Abnormal   Collection Time    08/19/12  7:31 PM      Result Value Range   WBC 6.1  4.0 - 10.5 K/uL   RBC 4.30  3.87 - 5.11 MIL/uL   Hemoglobin 13.6  12.0 - 15.0 g/dL   HCT 40.9  81.1 - 91.4 %   MCV 90.2  78.0 - 100.0 fL   MCH 31.6  26.0 - 34.0 pg   MCHC 35.1  30.0 - 36.0 g/dL   RDW 78.2  95.6 - 21.3 %   Platelets 225  150 - 400 K/uL   Neutrophils Relative 84 (*) 43 - 77 %   Neutro Abs 5.1  1.7 - 7.7 K/uL   Lymphocytes Relative 13  12 - 46 %   Lymphs Abs 0.8  0.7 - 4.0 K/uL   Monocytes Relative 2 (*) 3 - 12 %   Monocytes Absolute 0.1  0.1 - 1.0 K/uL   Eosinophils Relative 0  0 - 5 %   Eosinophils Absolute 0.0  0.0 - 0.7 K/uL   Basophils Relative 0  0 - 1 %   Basophils Absolute 0.0  0.0 - 0.1 K/uL  COMPREHENSIVE METABOLIC PANEL     Status: Abnormal   Collection Time    08/19/12  7:31 PM  Result Value Range   Sodium 140  135 - 145 mEq/L   Potassium 3.4 (*) 3.5 - 5.1 mEq/L   Chloride 102  96 - 112 mEq/L   CO2 26  19 - 32 mEq/L   Glucose,  Bld 133 (*) 70 - 99 mg/dL   BUN 35 (*) 6 - 23 mg/dL   Creatinine, Ser 1.61  0.50 - 1.10 mg/dL   Calcium 9.2  8.4 - 09.6 mg/dL   Total Protein 7.6  6.0 - 8.3 g/dL   Albumin 3.6  3.5 - 5.2 g/dL   AST 17  0 - 37 U/L   ALT 9  0 - 35 U/L   Alkaline Phosphatase 87  39 - 117 U/L   Total Bilirubin 0.2 (*) 0.3 - 1.2 mg/dL   GFR calc non Af Amer 89 (*) >90 mL/min   GFR calc Af Amer >90  >90 mL/min   Comment:            The eGFR has been calculated     using the CKD EPI equation.     This calculation has not been     validated in all clinical     situations.     eGFR's persistently     <90 mL/min signify     possible Chronic Kidney Disease.  CG4 I-STAT (LACTIC ACID)     Status: None   Collection Time    08/19/12  7:49 PM      Result Value Range   Lactic Acid, Venous 1.01  0.5 - 2.2 mmol/L  URINALYSIS, ROUTINE W REFLEX MICROSCOPIC     Status: Abnormal   Collection Time    08/19/12  8:16 PM      Result Value Range   Color, Urine YELLOW  YELLOW   APPearance CLOUDY (*) CLEAR   Specific Gravity, Urine 1.030  1.005 - 1.030   pH 6.0  5.0 - 8.0   Glucose, UA NEGATIVE  NEGATIVE mg/dL   Hgb urine dipstick MODERATE (*) NEGATIVE   Bilirubin Urine SMALL (*) NEGATIVE   Ketones, ur 40 (*) NEGATIVE mg/dL   Protein, ur 30 (*) NEGATIVE mg/dL   Urobilinogen, UA 1.0  0.0 - 1.0 mg/dL   Nitrite NEGATIVE  NEGATIVE   Leukocytes, UA NEGATIVE  NEGATIVE  URINE MICROSCOPIC-ADD ON     Status: None   Collection Time    08/19/12  8:16 PM      Result Value Range   RBC / HPF 7-10  <3 RBC/hpf   Urine-Other MUCOUS PRESENT    OCCULT BLOOD, POC DEVICE     Status: Abnormal   Collection Time    08/19/12  8:38 PM      Result Value Range   Fecal Occult Bld POSITIVE (*) NEGATIVE  CBC     Status: Abnormal   Collection Time    08/19/12 11:43 PM      Result Value Range   WBC 5.1  4.0 - 10.5 K/uL   RBC 3.84 (*) 3.87 - 5.11 MIL/uL   Hemoglobin 11.9 (*) 12.0 - 15.0 g/dL   HCT 04.5 (*) 40.9 - 81.1 %   MCV 91.1   78.0 - 100.0 fL   MCH 31.0  26.0 - 34.0 pg   MCHC 34.0  30.0 - 36.0 g/dL   RDW 91.4  78.2 - 95.6 %   Platelets 196  150 - 400 K/uL  MRSA PCR SCREENING     Status: None   Collection Time  08/20/12  3:38 AM      Result Value Range   MRSA by PCR NEGATIVE  NEGATIVE   Comment:            The GeneXpert MRSA Assay (FDA     approved for NASAL specimens     only), is one component of a     comprehensive MRSA colonization     surveillance program. It is not     intended to diagnose MRSA     infection nor to guide or     monitor treatment for     MRSA infections.  CBC     Status: Abnormal   Collection Time    08/20/12  5:20 AM      Result Value Range   WBC 5.4  4.0 - 10.5 K/uL   RBC 3.70 (*) 3.87 - 5.11 MIL/uL   Hemoglobin 11.3 (*) 12.0 - 15.0 g/dL   HCT 16.1 (*) 09.6 - 04.5 %   MCV 92.2  78.0 - 100.0 fL   MCH 30.5  26.0 - 34.0 pg   MCHC 33.1  30.0 - 36.0 g/dL   RDW 40.9  81.1 - 91.4 %   Platelets 204  150 - 400 K/uL  CBC     Status: Abnormal   Collection Time    08/20/12  5:20 AM      Result Value Range   WBC 5.5  4.0 - 10.5 K/uL   RBC 3.72 (*) 3.87 - 5.11 MIL/uL   Hemoglobin 11.4 (*) 12.0 - 15.0 g/dL   HCT 78.2 (*) 95.6 - 21.3 %   MCV 93.0  78.0 - 100.0 fL   MCH 30.6  26.0 - 34.0 pg   MCHC 32.9  30.0 - 36.0 g/dL   RDW 08.6  57.8 - 46.9 %   Platelets 199  150 - 400 K/uL  TYPE AND SCREEN     Status: None   Collection Time    08/20/12  5:20 AM      Result Value Range   ABO/RH(D) O POS     Antibody Screen NEG     Sample Expiration 08/23/2012    COMPREHENSIVE METABOLIC PANEL     Status: Abnormal   Collection Time    08/20/12  5:20 AM      Result Value Range   Sodium 142  135 - 145 mEq/L   Potassium 2.8 (*) 3.5 - 5.1 mEq/L   Chloride 108  96 - 112 mEq/L   CO2 25  19 - 32 mEq/L   Glucose, Bld 107 (*) 70 - 99 mg/dL   BUN 24 (*) 6 - 23 mg/dL   Creatinine, Ser 6.29  0.50 - 1.10 mg/dL   Calcium 8.7  8.4 - 52.8 mg/dL   Total Protein 6.7  6.0 - 8.3 g/dL   Albumin 3.1 (*)  3.5 - 5.2 g/dL   AST 16  0 - 37 U/L   ALT 7  0 - 35 U/L   Alkaline Phosphatase 73  39 - 117 U/L   Total Bilirubin 0.2 (*) 0.3 - 1.2 mg/dL   GFR calc non Af Amer >90  >90 mL/min   GFR calc Af Amer >90  >90 mL/min   Comment:            The eGFR has been calculated     using the CKD EPI equation.     This calculation has not been     validated in all clinical  situations.     eGFR's persistently     <90 mL/min signify     possible Chronic Kidney Disease.  TROPONIN I     Status: None   Collection Time    08/20/12  5:20 AM      Result Value Range   Troponin I <0.30  <0.30 ng/mL   Comment:            Due to the release kinetics of cTnI,     a negative result within the first hours     of the onset of symptoms does not rule out     myocardial infarction with certainty.     If myocardial infarction is still suspected,     repeat the test at appropriate intervals.  LIPASE, BLOOD     Status: None   Collection Time    08/20/12  5:20 AM      Result Value Range   Lipase 20  11 - 59 U/L   No results found.  ROS: General:no colds or fevers, no weight changes Skin:no rashes or ulcers HEENT:no blurred vision, no congestion CV:see HPI PUL:see HPI GI:no diarrhea constipation or melena, no indigestion, see HPI GU:no hematuria, no dysuria MS:no joint pain, no claudication Neuro:no syncope, no lightheadedness Endo:no diabetes, no thyroid disease   Blood pressure 158/67, pulse 74, temperature 98.2 F (36.8 C), temperature source Oral, resp. rate 19, height 5\' 2"  (1.575 m), weight 46.4 kg (102 lb 4.7 oz), SpO2 94.00%. PE: General:alert and oriented, NAD, pleasant affect Skin:warm and dry, brisk capillary refill HEENT:normocephlaic, sclera clear Neck:supple, no JVD, no bruits Heart:S1S2 RRR, 2/6 SEM.  No gallup or rub. Lungs:clear without rales, rhonchi or wheezes Abd:+ BS, soft, non tender Ext:no edema, SCD stockings in place for VTE prophylaxis. Neuro:alert and oriented X 3 , MAE  follows commands.     Assessment/Plan Principal Problem:   GI bleed Active Problems:   COPD   Nausea and vomiting   CAD (coronary artery disease)  PLAN: Admitted for GI bleed.  GI to see,  ok to hold Plavix and ASA, though would like to resume ASA when stable.  DES stents are 76 years old.  ? Stop plavix completely.   INGOLD,LAURA R 08/20/2012, 8:29 AM

## 2012-08-20 NOTE — Progress Notes (Signed)
[facility] Progress Note [place] TEAM 1 - Stepdown/ICU TEAM   Kathy Marshall WUJ:[id number] DOB: 1934-11-02 DOA: 08/19/2012 PCP: Leo Grosser, MD  Brief narrative: 78 y.o. female with history of CAD status post stenting, COPD, hypertension, ongoing tobacco abuse presented to the ER because of nausea vomiting. Patient stated she had at least 3 episodes during which she had thrown up dark colored coffee-ground-type vomitus. In the ER patient was stool for occult blood positive. Patient stated that prior to each episode she felt crampy pain in the abdomen. Denied having any diarrhea or blood in the stools. Patient takes  aspirin and Plavix for her CAD but denied taking any NSAIDs.   In the ER repeat hemoglobin showed a 2 g drop from 13 -11. EGD done 2 years ago revealed severe gastritis with stenosis in the duodenum. Coloscopy revealed only a polyp ([facility]).   Assessment/Plan:  Upper GI bleed  Possibly simply mallory weiss tear - given hx of gastritis and duodenal stricture, as well as expanded hx of multiple spells of regurgitation predating hematemesis, I suspect repeat EGD is warranted - I have consulted GI  CAD status post stenting See discussion per [facility]   COPD  presently not wheezing/wll compensated  Hyperlipidemia continue statins once diet resumed  Ongoing tobacco abuse  strongly advised to quit smoking  Chronic pain continue present medications  Hypokalemia Replacing  Code Status: FULL Family Communication: spoke w/ pt and daughters at bedside Disposition Plan: SDU   Consultants: [facility]  Procedures: none  Antibiotics: none  DVT prophylaxis: SCDs  HPI/Subjective: Pt seen for f/u vist  Objective: Blood pressure 118/92, pulse 71, temperature 98.2 F (36.8 C), temperature source Oral, resp. rate 16, height 5\' 2"  (1.575 m), weight 46.4 kg (102 lb 4.7 oz), SpO2 96.00%.  Intake/Output Summary (Last 24 hours) at 08/20/12 1346 Last  data filed at 08/20/12 1300  Gross per 24 hour  Intake   1375 ml  Output    851 ml  Net    524 ml   Exam: F/U exam completed  Data Reviewed: Basic Metabolic Panel:  Recent Labs Lab 08/19/12 1931 08/20/12 0520  NA 140 142  K 3.4* 2.8*  CL 102 108  CO2 26 25  GLUCOSE 133* 107*  BUN 35* 24*  CREATININE 0.56 0.50  CALCIUM 9.2 8.7   Liver Function Tests:  Recent Labs Lab 08/19/12 1931 08/20/12 0520  AST 17 16  ALT 9 7  ALKPHOS 87 73  BILITOT 0.2* 0.2*  PROT 7.6 6.7  ALBUMIN 3.6 3.1*    Recent Labs Lab 08/20/12 0520  LIPASE 20   CBC:  Recent Labs Lab 08/19/12 1931 08/19/12 2343 08/20/12 0520  WBC 6.1 5.1 5.4  5.5  NEUTROABS 5.1  --   --   HGB 13.6 11.9* 11.3*  11.4*  HCT 38.8 35.0* 34.1*  34.6*  MCV 90.2 91.1 92.2  93.0  PLT 225 196 204  199   Cardiac Enzymes:  Recent Labs Lab 08/20/12 0520  TROPONINI <0.30    Recent Results (from the past 240 hour(s))  MRSA PCR SCREENING     Status: None   Collection Time    08/20/12  3:38 AM      Result Value Range Status   MRSA by PCR NEGATIVE  NEGATIVE Final   Comment:            The GeneXpert MRSA Assay (FDA     approved for NASAL specimens     only),  is one component of a     comprehensive MRSA colonization     surveillance program. It is not     intended to diagnose MRSA     infection nor to guide or     monitor treatment for     MRSA infections.     Studies:  Recent x-ray studies have been reviewed in detail by the Attending Physician  Scheduled Meds:  Reviewed in detail by the Attending Physician   [facility]  [facility] Office  [phone] Pager - Text Page per Amion as per below:  On-Call/Text Page:      [email or web address]      password [email or web address]  If 7PM-7AM, please contact night-coverage [email or web address] Password [email or web address] 08/20/2012, 1:46 PM   LOS: 1 day

## 2012-08-21 ENCOUNTER — Encounter (HOSPITAL_COMMUNITY)
Admission: EM | Disposition: A | Discharge status: Home / Self Care | Payer: Self-pay | Source: Home / Self Care | Attending: Internal Medicine

## 2012-08-21 ENCOUNTER — Encounter (HOSPITAL_COMMUNITY): Payer: Self-pay | Admitting: Gastroenterology

## 2012-08-21 DIAGNOSIS — K250 Acute gastric ulcer with hemorrhage: Secondary | ICD-10-CM

## 2012-08-21 HISTORY — PX: ESOPHAGOGASTRODUODENOSCOPY: SHX5428

## 2012-08-21 LAB — CBC
HCT: 32.8 % — ABNORMAL LOW (ref 36.0–46.0)
Platelets: 197 10*3/uL (ref 150–400)
RDW: 13.4 % (ref 11.5–15.5)
WBC: 4.5 10*3/uL (ref 4.0–10.5)

## 2012-08-21 LAB — URINE CULTURE

## 2012-08-21 LAB — BASIC METABOLIC PANEL
Chloride: 106 mEq/L (ref 96–112)
GFR calc Af Amer: >90 mL/min (ref >90)
Potassium: 4 mEq/L (ref 3.5–5.1)
Sodium: 140 mEq/L (ref 135–145)

## 2012-08-21 SURGERY — EGD (ESOPHAGOGASTRODUODENOSCOPY)
Anesthesia: Moderate Sedation

## 2012-08-21 MED ORDER — FENTANYL CITRATE 0.05 MG/ML IJ SOLN
INTRAMUSCULAR | Status: AC
  Filled 2012-08-21: qty 2

## 2012-08-21 MED ORDER — MIDAZOLAM HCL 10 MG/2ML IJ SOLN
INTRAMUSCULAR | Status: DC | PRN
  Administered 2012-08-21 (×2): 2 mg via INTRAVENOUS

## 2012-08-21 MED ORDER — MEGESTROL ACETATE 40 MG PO TABS: 40.0000 mg | ORAL_TABLET | Freq: Every day | ORAL | Status: DC

## 2012-08-21 MED ORDER — SODIUM CHLORIDE 0.9 % IV SOLN
8.0000 mg/h | INTRAVENOUS | Status: DC
  Administered 2012-08-21 – 2012-08-22 (×3): 8 mg/h via INTRAVENOUS
  Filled 2012-08-21 (×10): qty 80

## 2012-08-21 MED ORDER — EPINEPHRINE HCL 0.1 MG/ML IJ SOLN
INTRAMUSCULAR | Status: AC
  Filled 2012-08-21: qty 10

## 2012-08-21 MED ORDER — MEGESTROL ACETATE 40 MG PO TABS
40.0000 mg | ORAL_TABLET | Freq: Every day | ORAL | Status: DC
  Administered 2012-08-21 – 2012-08-23 (×3): 40 mg via ORAL
  Filled 2012-08-21 (×3): qty 1

## 2012-08-21 MED ORDER — ONDANSETRON HCL 4 MG/2ML IJ SOLN
INTRAMUSCULAR | Status: AC
  Filled 2012-08-21: qty 2

## 2012-08-21 MED ORDER — MIDAZOLAM HCL 5 MG/ML IJ SOLN
INTRAMUSCULAR | Status: AC
  Filled 2012-08-21: qty 2

## 2012-08-21 MED ORDER — SODIUM CHLORIDE 0.9 % IV SOLN
INTRAVENOUS | Status: DC
  Administered 2012-08-21: 500 mL via INTRAVENOUS

## 2012-08-21 MED ORDER — PROCHLORPERAZINE EDISYLATE 5 MG/ML IJ SOLN
10.0000 mg | Freq: Four times a day (QID) | INTRAMUSCULAR | Status: DC | PRN
  Administered 2012-08-21: 10 mg via INTRAVENOUS
  Filled 2012-08-21: qty 2

## 2012-08-21 MED ORDER — METOCLOPRAMIDE HCL 5 MG/ML IJ SOLN: 10.0000 mg | Freq: Four times a day (QID) | INTRAMUSCULAR | Status: DC | PRN

## 2012-08-21 MED ORDER — FENTANYL CITRATE 0.05 MG/ML IJ SOLN
INTRAMUSCULAR | Status: DC | PRN
  Administered 2012-08-21 (×2): 25 ug via INTRAVENOUS
  Administered 2012-08-21: 10 ug via INTRAVENOUS

## 2012-08-21 MED ORDER — BUTAMBEN-TETRACAINE-BENZOCAINE 2-2-14 % EX AERO
INHALATION_SPRAY | CUTANEOUS | Status: DC | PRN
  Administered 2012-08-21 (×2): 1 via TOPICAL

## 2012-08-21 MED ORDER — SODIUM CHLORIDE 0.9 % IJ SOLN
INTRAMUSCULAR | Status: DC | PRN
  Administered 2012-08-21: 15:00:00

## 2012-08-21 MED ORDER — PANTOPRAZOLE SODIUM 40 MG PO TBEC: 40.0000 mg | DELAYED_RELEASE_TABLET | Freq: Every day | ORAL | Status: DC

## 2012-08-21 NOTE — Clinical Documentation Improvement (Deleted)
BMI DOCUMENTATION CLARIFICATION QUERY   CLINICAL DOCUMENTATION QUERIES ARE NOT PART OF THE PERMANENT MEDICAL RECORD         08/21/12  Jennye Moccasin PA and/or Associates,  In an effort to better capture your patient's severity of illness, reflect appropriate length of stay and utilization of resources, a review of the patient medical record has revealed the following indicators:  Height -    5'  2" per Registered Dietician Consult   Weight -    102 lbs  4.7 ozs per Registered Dietician Consult  BMI -  18.7 per Registered Dietician Consult  Per GI Consult, patient reports ongoing weight loss, has reportedly gone from a size 14 to a size 6 in the past 2 years or so.  Patient also described as "chronically unwell, thin, osteoporotic looking wf"  Based on your clinical judgment, please document in the progress notes if a condition below provides greater specificity regarding the patient's nutritional status and body habitus:   - Anorexia, Weight Loss, Current BMI 18.7   - Other Condition   - Unable to Clinically Determine    In responding to this query please exercise your independent judgment.    The fact that a query is asked, does not imply that any particular answer is desired or expected.    Reviewed: additional documentation in the medical record  Thank You,  Jerral Ralph  RN BSN CCDS Certified Clinical Documentation Specialist: Cell   [phone]  Health Information Management [place]   TO RESPOND TO THE THIS QUERY, FOLLOW THE INSTRUCTIONS BELOW:  1. If needed, update documentation for the patient's encounter via the notes activity.  2. Access this query again and click edit on the In [employer].  3. After updating, or not, click F2 to complete all highlighted (required) fields concerning your review. Select "additional documentation in the medical record" OR "no additional documentation provided".  4. Click Sign note button.  5. The deficiency  will fall out of your In Basket *Please let us know if you are not able to complete this workflow by phone or e-mail (listed below).

## 2012-08-21 NOTE — Progress Notes (Signed)
[facility] Progress Note [place] TEAM 1 - Stepdown/ICU TEAM   MAYCEE BLASCO WJX:[id number] DOB: 10-06-1934 DOA: 08/19/2012 PCP: Leo Grosser, MD  Brief narrative: 78 y.o. female with history of CAD status post stenting, COPD, hypertension, ongoing tobacco abuse presented to the ER because of nausea vomiting. Patient stated she had at least 3 episodes during which she had thrown up dark colored coffee-ground-type vomitus. In the ER patient was stool for occult blood positive. Patient stated that prior to each episode she felt crampy pain in the abdomen. Denied having any diarrhea or blood in the stools. Patient takes  aspirin and Plavix for her CAD but denied taking any NSAIDs.   In the ER repeat hemoglobin showed a 2 g drop from 13 -11. EGD done 2 years ago revealed severe gastritis with stenosis in the duodenum. Coloscopy revealed only a polyp ([facility]).   Assessment/Plan:  Upper GI bleed  -Prepyloric ulcer found on EGD today and clipped. -Continue Protonix infusion and follow H&H -Biopsies obtained will need to be followed -Patient was insisting on being discharged today but has been advised to spend the night and if no further bleeding occurs tomorrow can be discharged in the morning  Anorexia/weight loss Likely exacerbated by peptic ulcer Start Megace to improve appetite  CAD status post stenting See discussion per [facility]   COPD  presently not wheezing/wll compensated  Hyperlipidemia continue statins once diet resumed  Ongoing tobacco abuse  strongly advised to quit smoking  Chronic pain continue present medications  Hypokalemia Replacing  Code Status: FULL Family Communication: spoke w/ pt and daughters at bedside Disposition Plan: SDU   Consultants: [facility]  Procedures: none  Antibiotics: none  DVT prophylaxis: SCDs  HPI/Subjective: Pt has not had any further bloody vomitus-she had a normal bowel movement last night-no acute  abdominal pain. The daughter and patient both describe loss of appetite and a significant amount of weight loss over the year-although workup has been done as an outpatient no clear-cut cause was found. They would like her to be on an appetite stimulant-we have discussed Megace versus Marinol and the patient has chosen to try Megace.  Objective: Blood pressure 158/68, pulse 52, temperature 98.4 F (36.9 C), temperature source Oral, resp. rate 19, height 5\' 2"  (1.575 m), weight 46.4 kg (102 lb 4.7 oz), SpO2 94.00%.  Intake/Output Summary (Last 24 hours) at 08/21/12 1824 Last data filed at 08/21/12 1800  Gross per 24 hour  Intake   1756 ml  Output   1400 ml  Net    356 ml   Exam: General: Awake alert oriented x3-no acute distress CVS: Regular rate and rhythm no murmurs rubs or gallops Respiratory: Clear to auscultation bilaterally GI: Nontender nondistended bowel sounds positive Extremity: No cyanosis clubbing or edema  Data Reviewed: Basic Metabolic Panel:  Recent Labs Lab 08/19/12 1931 08/20/12 0520 08/20/12 1538 08/21/12 0552  NA 140 142 142 140  K 3.4* 2.8* 5.5* 4.0  CL 102 108 111 106  CO2 26 25 25 25   GLUCOSE 133* 107* 106* 82  BUN 35* 24* 18 11  CREATININE 0.56 0.50 0.49* 0.53  CALCIUM 9.2 8.7 8.8 9.2   Liver Function Tests:  Recent Labs Lab 08/19/12 1931 08/20/12 0520  AST 17 16  ALT 9 7  ALKPHOS 87 73  BILITOT 0.2* 0.2*  PROT 7.6 6.7  ALBUMIN 3.6 3.1*    Recent Labs Lab 08/20/12 0520  LIPASE 20   CBC:  Recent Labs Lab 08/19/12  1931 08/19/12 2343 08/20/12 0520 08/20/12 2004 08/21/12 0552  WBC 6.1 5.1 5.4  5.5 4.7 4.5  NEUTROABS 5.1  --   --   --   --   HGB 13.6 11.9* 11.3*  11.4* 10.2* 11.0*  HCT 38.8 35.0* 34.1*  34.6* 30.6* 32.8*  MCV 90.2 91.1 92.2  93.0 93.0 91.9  PLT 225 196 204  199 183 197   Cardiac Enzymes:  Recent Labs Lab 08/20/12 0520  TROPONINI <0.30    Recent Results (from the past 240 hour(s))  URINE CULTURE      Status: None   Collection Time    08/19/12  8:16 PM      Result Value Range Status   Specimen Description URINE, CLEAN CATCH   Final   Special Requests NONE   Final   Culture  Setup Time 08/20/2012 02:00   Final   Colony Count 4,000 COLONIES/ML   Final   Culture INSIGNIFICANT GROWTH   Final   Report Status 08/21/2012 FINAL   Final  MRSA PCR SCREENING     Status: None   Collection Time    08/20/12  3:38 AM      Result Value Range Status   MRSA by PCR NEGATIVE  NEGATIVE Final   Comment:            The GeneXpert MRSA Assay (FDA     approved for NASAL specimens     only), is one component of a     comprehensive MRSA colonization     surveillance program. It is not     intended to diagnose MRSA     infection nor to guide or     monitor treatment for     MRSA infections.     Studies:  Recent x-ray studies have been reviewed in detail by the Attending Physician  Scheduled Meds:  Reviewed in detail by the Attending Physician   [facility]  [facility] Office  [phone] Pager - Text Page per Amion as per below:  On-Call/Text Page:      [email or web address]      password [email or web address]  If 7PM-7AM, please contact night-coverage [email or web address] Password [facility] 08/21/2012, 6:24 PM   LOS: 2 days

## 2012-08-21 NOTE — Progress Notes (Signed)
Notified K.Schorr. BP 174/76. No new orders received.

## 2012-08-21 NOTE — Progress Notes (Signed)
     [facility] Daily Rounding Note 08/21/2012, 8:43 AM  SUBJECTIVE:       One BM last night, not grossly blody but formed and dark in color.  "My back is killing me".  She wonders if she might be able to go home after EGD.  No nausea, no belly pain.  Not dizzy but has not gotten up OOB.  No headache   OBJECTIVE:         Vital signs in last 24 hours:    Temp:  [98.2 F (36.8 C)-98.6 F (37 C)] 98.6 F (37 C) (02/25 0421) Pulse Rate:  [52-71] 52 (02/25 0600) Resp:  [16-23] 19 (02/25 0600) BP: (118-174)/(54-92) 151/54 mmHg (02/25 0600) SpO2:  [93 %-96 %] 93 % (02/25 0421) Last BM Date: 08/20/12 General: looks a little better, more rested   Heart: RRR Chest: clear.  No overt dyspnea. Abdomen: soft, active BS.  NT, ND  Extremities: no pedal edema Neuro/Psych:  Pleasant, cooperative, relaxed, no confusion.  Intake/Output from previous day: 02/24 0701 - 02/25 0700 In: 2328 [I.V.:1828; IV Piggyback:500] Out: 2401 [Urine:2400; Stool:1]  Intake/Output this shift:    Lab Results:  Recent Labs  08/20/12 0520 08/20/12 2004 08/21/12 0552  WBC 5.4  5.5 4.7 4.5  HGB 11.3*  11.4* 10.2* 11.0*  HCT 34.1*  34.6* 30.6* 32.8*  PLT 204  199 183 197  MCV    91.9   BMET  Recent Labs  08/20/12 0520 08/20/12 1538 08/21/12 0552  NA 142 142 140  K 2.8* 5.5* 4.0  CL 108 111 106  CO2 25 25 25   GLUCOSE 107* 106* 82  BUN 24* 18 11  CREATININE 0.50 0.49* 0.53  CALCIUM 8.7 8.8 9.2    ASSESMENT: * Coffee ground emesis in pt with hx recurrent esophagitis, gastritis, duodeniitis who smokes and usestid Goddie's. Suspect uncontrolled GERD vs ulcer disease. This is associated with FOB + stool.  *  Anorexia, Weight Loss, Current BMI 18.7. *  Mild normocytic anemia.  Hgb improved last 16 hours.  *  Azotemia, resolved.  * CAD. Hx cardiac stents. Chronic Plavix  * COPD. Still 1ppd smoker.  * hypokalemia.  * Hematuria on 2/21.  * Chronic pain and chronic narcotics as well as chronic  benzos.     PLAN: *  EGD 1430 today.  *  Added CBC for the AM if she is still here.    LOS: 2 days   Jennye Moccasin  08/21/2012, 8:43 AM Pager: [phone] I have reviewed the above note, examined the patient and agree with plan of treatment.For EGD today. Hemodynamically stable from a GIB, will decide about a discharge status from EGD.  [facility] Pager # [phone]

## 2012-08-21 NOTE — Interval H&P Note (Signed)
History and Physical Interval Note:  08/21/2012 2:54 PM  Kathy Marshall  has presented today for surgery, with the diagnosis of throwing up bloody material  The various methods of treatment have been discussed with the patient and family. After consideration of risks, benefits and other options for treatment, the patient has consented to  Procedure(s): ESOPHAGOGASTRODUODENOSCOPY (EGD) (N/A) as a surgical intervention .  The patient's history has been reviewed, patient examined, no change in status, stable for surgery.  I have reviewed the patient's chart and labs.  Questions were answered to the patient's satisfaction.     [name]

## 2012-08-21 NOTE — Op Note (Signed)
[name] [address] [place], [address]   ENDOSCOPY PROCEDURE REPORT  PATIENT: Kathy, Marshall  MR#: [id number] BIRTHDATE: 11-06-1934 , 77  yrs. old GENDER: Female ENDOSCOPIST: Hart Carwin, MD REFERRED BY:  Jetty Duhamel, M.D. PROCEDURE DATE:  08/21/2012 PROCEDURE:  EGD w/ biopsy and EGD w/ control of bleeding ASA CLASS:     Class III INDICATIONS:  Melena.   Hematemesis. MEDICATIONS: These medications were titrated to patient response per physician's verbal order, Fentanyl-Quick Pick, Fentanyl-Detailed 60 ug IV, and Versed 4 mg IV TOPICAL ANESTHETIC: Cetacaine Spray  DESCRIPTION OF PROCEDURE: After the risks benefits and alternatives of the procedure were thoroughly explained, informed consent was obtained.  The Pentax Gastroscope [id number] endoscope was introduced through the mouth and advanced to the second portion of the duodenum. Without limitations.  The instrument was slowly withdrawn as the mucosa was fully examined.        STOMACH: A single deep and round prepyloric  ulcer ranging between 3-84mm in size with a visible vessel and active oozing of blood was found in the prepyloric region of the stomach.  Hemostasis was achieve with 3 separate injections of Epinephrine 1:10 000u circumferentially to total 4 cc's, and  by placing two Boston Resolution hemoclips on the bleeding site. Pyloric outlet was not obstructed, the ulcer appeared benign, biopsies were taken from the surrounding mucosa to r/o H/Pylori Retroflexed views revealed no abnormalities.     The scope was then withdrawn from the patient and the procedure completed.  COMPLICATIONS: There were no complications. ENDOSCOPIC IMPRESSION: Single ulcer ranging between 3-5mm in size was found in the prepyloric region of the stomach; Hemostasis was attempted by placing two hemoclips on the bleeding site and Epinephrine injection of 4 cc  in 3 separate sites achieving  complete hemostasis  RECOMMENDATIONS: 1. Watch for further bleeding- H/H, Vital signs 2. PPI infusion 3. await biopsies  REPEAT EXAM: may need repeat if bleeding recurs  eSigned:  Hart Carwin, MD 08/21/2012 3:32 PM   CC:  PATIENT NAME:  Kathy, Marshall MR#: [id number]

## 2012-08-22 ENCOUNTER — Encounter (HOSPITAL_COMMUNITY): Payer: Self-pay | Admitting: Internal Medicine

## 2012-08-22 DIAGNOSIS — K250 Acute gastric ulcer with hemorrhage: Principal | ICD-10-CM

## 2012-08-22 DIAGNOSIS — R634 Abnormal weight loss: Secondary | ICD-10-CM

## 2012-08-22 DIAGNOSIS — K219 Gastro-esophageal reflux disease without esophagitis: Secondary | ICD-10-CM

## 2012-08-22 LAB — CBC
HCT: 31.6 % — ABNORMAL LOW (ref 36.0–46.0)
Hemoglobin: 11 g/dL — ABNORMAL LOW (ref 12.0–15.0)
RBC: 3.54 MIL/uL — ABNORMAL LOW (ref 3.87–5.11)
WBC: 5 10*3/uL (ref 4.0–10.5)

## 2012-08-22 MED ORDER — ALBUTEROL SULFATE HFA 108 (90 BASE) MCG/ACT IN AERS
2.0000 | INHALATION_SPRAY | RESPIRATORY_TRACT | Status: DC | PRN
Start: 2012-08-22 — End: 2012-08-23
  Filled 2012-08-22: qty 6.7

## 2012-08-22 NOTE — Progress Notes (Signed)
     [facility] Daily Rounding Note 08/22/2012, 8:20 AM  SUBJECTIVE:       No BMs.  No n/v.  Still with back pain.  No headache.  She is hungry, still NPO  OBJECTIVE:         Vital signs in last 24 hours:    Temp:  [98.2 F (36.8 C)-99.5 F (37.5 C)] 99.5 F (37.5 C) (02/26 0730) Pulse Rate:  [51-73] 66 (02/26 0730) Resp:  [14-30] 19 (02/26 0730) BP: (122-228)/(50-122) 170/74 mmHg (02/26 0730) SpO2:  [91 %-99 %] 95 % (02/26 0730) Weight:  [50.1 kg (110 lb 7.2 oz)] 50.1 kg (110 lb 7.2 oz) (02/25 2332) Last BM Date: 08/20/12 General: looks better but not well.    Heart: RRR Chest: clear B.  No SOB  Abdomen: soft, no bruits or masses.  Active BS.  ND  Extremities: no pedal edema.  Feet warm Neuro/Psych:  Pleasant, oriented x 3.  Reasonable and not anxious.   Intake/Output from previous day: 02/25 0701 - 02/26 0700 In: 2153 [I.V.:2153] Out: 1275 [Urine:1275]  Intake/Output this shift:    Lab Results:  Recent Labs  08/20/12 2004 08/21/12 0552 08/22/12 0544  WBC 4.7 4.5 5.0  HGB 10.2* 11.0* 11.0*  HCT 30.6* 32.8* 31.6*  PLT 183 197 204   BMET  Recent Labs  08/20/12 0520 08/20/12 1538 08/21/12 0552  NA 142 142 140  K 2.8* 5.5* 4.0  CL 108 111 106  CO2 25 25 25   GLUCOSE 107* 106* 82  BUN 24* 18 11  CREATININE 0.50 0.49* 0.53  CALCIUM 8.7 8.8 9.2   LFT  Recent Labs  08/19/12 1931 08/20/12 0520  PROT 7.6 6.7  ALBUMIN 3.6 3.1*  AST 17 16  ALT 9 7  ALKPHOS 87 73  BILITOT 0.2* 0.2*    ASSESMENT: * Coffee ground emesis.  EGD 2/25:  Solitary prepyloric ulcer with vv and oozing blood treated with epi and 2 endoclips.  Placed on Protonix drip afterwards, BID Protonix before that.  Bx obtained.  Suspect cause is once again use of ASA products. Still NPO.  * Anorexia, Weight Loss, Current BMI 18.7. Ulcer disease is contributing.  * Mild normocytic anemia. Hgb stable.  * Azotemia, resolved and further improved.   * CAD. RCA Cypher stent in 2006. Chronic  Plavix on hold.  This is not indicated in 3 y/o stents.   * COPD. Still 1ppd smoker.  * hypokalemia.  * Hematuria on 2/21.  * Chronic pain and chronic narcotics as well as chronic benzos.  *  Chronic headaches.  This is the reason she uses Goodies    PLAN: *  Advance diet. *  Continue PPI drip *  CBC in AM *   Added heating pad. *  ? Cardiac and GI safe alternative headache meds? *  ? Transfer to non-tele bed?   LOS: 3 days   Kathy Marshall  08/22/2012, 8:20 AM  Reviewed, long term risk for recurrent PUD due to smoking and noncompliance. Will see as an outpatient. Pager: [phone]

## 2012-08-22 NOTE — Progress Notes (Addendum)
[facility] Progress Note [place] TEAM 1 - Stepdown/ICU TEAM   MARCHEL FOOTE ZOX:[id number] DOB: 1934-11-04 DOA: 08/19/2012 PCP: Leo Grosser, MD  Brief narrative: 78 y.o. female with history of CAD status post stenting, COPD, hypertension, ongoing tobacco abuse presented to the ER because of nausea vomiting. Patient stated she had at least 3 episodes during which she had thrown up dark colored coffee-ground-type vomitus. In the ER patient was stool for occult blood positive. Patient stated that prior to each episode she felt crampy pain in the abdomen. Denied having any diarrhea or blood in the stools. Patient takes  aspirin and Plavix for her CAD but denied taking any NSAIDs.   In the ER repeat hemoglobin showed a 2 g drop from 13 -11. EGD done 2 years ago revealed severe gastritis with stenosis in the duodenum. Coloscopy revealed only a polyp ([facility]).   Assessment/Plan:  Upper GI bleed  -Prepyloric ulcer found on EGD 2/25 and clipped/injected -Continue Protonix infusion with transition to oral at discretion of GI Service -recheck CBC in AM -Biopsies obtained - awaiting results  -anticipate d/c 2/27 if Hgb stable as pt has just begun full diet and remains on protonix IV at this time  Anorexia/weight loss Likely exacerbated by peptic ulcer - Megace initiated   CAD status post stenting See discussion per [facility] - plavix stopped - holding asa for now  COPD  presently not wheezing/well compensated  Hyperlipidemia Resume statin today   Ongoing tobacco abuse  strongly advised to quit smoking  Chronic pain continue present medications - counseled on need to avoid NSAIDs (and explained what they are) - pt now admits to daily use of Goody Powder AND advil due to chronic low back pain - advised to stick to APAP, and counseled on closely monitoring amount of APAP consuming per day in that her Pain Clinic prescribes a narcotic/APAP combination - suggested she keep  her daily APAP consumption below 3G given her smaller size   Hypokalemia Replaced  Code Status: FULL Family Communication: spoke w/ pt and daughter at bedside Disposition Plan: SDU (anticipate d/c in AM, therefore will not transfer)  Consultants: [facility]  Procedures: 2/25 - EGD - Solitary prepyloric ulcer with vv and oozing blood treated with epi and 2 endoclips  Antibiotics: none  DVT prophylaxis: SCDs  HPI/Subjective: Patient is resting comfortably in good spirits today.  She denies fevers chills nausea vomiting or chest pain.  She began a regular diet at lunch but admits to eating very little.  She denies hematemesis or melena at this time.  Objective: Blood pressure 114/60, pulse 59, temperature 98 F (36.7 C), temperature source Oral, resp. rate 17, height 5\' 2"  (1.575 m), weight 50.1 kg (110 lb 7.2 oz), SpO2 94.00%.  Intake/Output Summary (Last 24 hours) at 08/22/12 1430 Last data filed at 08/22/12 1150  Gross per 24 hour  Intake   1900 ml  Output   1825 ml  Net     75 ml   Exam: General: Awake alert oriented x3-no acute distress - thin frail-appearing  CVS: Regular rate and rhythm no murmurs rubs or gallops Respiratory: Clear to auscultation bilaterally GI: Nontender nondistended bowel sounds positive Extremity: No cyanosis clubbing or edema  Data Reviewed: Basic Metabolic Panel:  Recent Labs Lab 08/19/12 1931 08/20/12 0520 08/20/12 1538 08/21/12 0552  NA 140 142 142 140  K 3.4* 2.8* 5.5* 4.0  CL 102 108 111 106  CO2 26 25 25 25   GLUCOSE 133* 107*  106* 82  BUN 35* 24* 18 11  CREATININE 0.56 0.50 0.49* 0.53  CALCIUM 9.2 8.7 8.8 9.2   Liver Function Tests:  Recent Labs Lab 08/19/12 1931 08/20/12 0520  AST 17 16  ALT 9 7  ALKPHOS 87 73  BILITOT 0.2* 0.2*  PROT 7.6 6.7  ALBUMIN 3.6 3.1*    Recent Labs Lab 08/20/12 0520  LIPASE 20   CBC:  Recent Labs Lab 08/19/12 1931 08/19/12 2343 08/20/12 0520 08/20/12 2004 08/21/12 0552  08/22/12 0544  WBC 6.1 5.1 5.4  5.5 4.7 4.5 5.0  NEUTROABS 5.1  --   --   --   --   --   HGB 13.6 11.9* 11.3*  11.4* 10.2* 11.0* 11.0*  HCT 38.8 35.0* 34.1*  34.6* 30.6* 32.8* 31.6*  MCV 90.2 91.1 92.2  93.0 93.0 91.9 89.3  PLT 225 196 204  199 183 197 204   Cardiac Enzymes:  Recent Labs Lab 08/20/12 0520  TROPONINI <0.30    Recent Results (from the past 240 hour(s))  URINE CULTURE     Status: None   Collection Time    08/19/12  8:16 PM      Result Value Range Status   Specimen Description URINE, CLEAN CATCH   Final   Special Requests NONE   Final   Culture  Setup Time 08/20/2012 02:00   Final   Colony Count 4,000 COLONIES/ML   Final   Culture INSIGNIFICANT GROWTH   Final   Report Status 08/21/2012 FINAL   Final  MRSA PCR SCREENING     Status: None   Collection Time    08/20/12  3:38 AM      Result Value Range Status   MRSA by PCR NEGATIVE  NEGATIVE Final   Comment:            The GeneXpert MRSA Assay (FDA     approved for NASAL specimens     only), is one component of a     comprehensive MRSA colonization     surveillance program. It is not     intended to diagnose MRSA     infection nor to guide or     monitor treatment for     MRSA infections.     Studies:  Recent x-ray studies have been reviewed in detail by the Attending Physician  Scheduled Meds:  Reviewed in detail by the Attending Physician   [facility]  [facility] Office  [phone] Pager - Text Page per Amion as per below:  On-Call/Text Page:      [email or web address]      password [email or web address]  If 7PM-7AM, please contact night-coverage [email or web address] Password [email or web address] 08/22/2012, 2:30 PM   LOS: 3 days

## 2012-08-23 DIAGNOSIS — R112 Nausea with vomiting, unspecified: Secondary | ICD-10-CM

## 2012-08-23 LAB — CBC
Hemoglobin: 11 g/dL — ABNORMAL LOW (ref 12.0–15.0)
MCH: 31.3 pg (ref 26.0–34.0)
MCV: 89.2 fL (ref 78.0–100.0)
RBC: 3.51 MIL/uL — ABNORMAL LOW (ref 3.87–5.11)
WBC: 5.3 10*3/uL (ref 4.0–10.5)

## 2012-08-23 MED ORDER — PANTOPRAZOLE SODIUM 40 MG PO TBEC: 40.0000 mg | DELAYED_RELEASE_TABLET | Freq: Two times a day (BID) | ORAL | Status: DC

## 2012-08-23 MED ORDER — METOPROLOL TARTRATE 25 MG PO TABS: 12.5000 mg | ORAL_TABLET | Freq: Two times a day (BID) | ORAL | Status: DC

## 2012-08-23 MED ORDER — NICOTINE 7 MG/24HR TD PT24: 1.0000 | MEDICATED_PATCH | TRANSDERMAL | Status: DC

## 2012-08-23 MED ORDER — NICOTINE 21 MG/24HR TD PT24: 1.0000 | MEDICATED_PATCH | TRANSDERMAL | Status: DC

## 2012-08-23 MED ORDER — NICOTINE 14 MG/24HR TD PT24: 1.0000 | MEDICATED_PATCH | TRANSDERMAL | Status: DC

## 2012-08-23 NOTE — Progress Notes (Signed)
Pt discharged home via wheelchair to car, in care of daughter. In stable condition. No distress. Discharge instructions given and pt and daughter verbalize understanding of follow up appointments and medication changes. Patient inquired about the need for a new prescription for Lopressor dose decrease, explained to patient that they could split them in half, however, they requested a new prescription. Notified Dr. Butler Denmark and she states to have pt split her pills in half to obtain desired dose of 12.5 mg. IV access removed with no adverse effects noted at site. No complaints. No distress. Seat belt placed on pt at d/c to car.

## 2012-08-23 NOTE — Progress Notes (Signed)
[facility] Progress Note  SUBJECTIVE: feels okay. No nausea. No overt GI bleeding.  OBJECTIVE:  Vital signs in last 24 hours: Temp:  [97.5 F (36.4 C)-98.6 F (37 C)] 98.6 F (37 C) (02/27 0737) Pulse Rate:  [51-69] 54 (02/27 0737) Resp:  [16-21] 19 (02/27 0737) BP: (114-169)/(60-78) 158/71 mmHg (02/27 0737) SpO2:  [92 %-95 %] 92 % (02/27 0822) Weight:  [110 lb 3.7 oz (50 kg)] 110 lb 3.7 oz (50 kg) (02/26 2314) Last BM Date: 08/20/12 General:    Pleasant white female in NAD Abdomen:  Soft, nontender and nondistended. Normal bowel sounds. Neurologic:  Alert and oriented,  grossly normal neurologically. Psych:  Cooperative. Normal mood and affect.   Lab Results:  Recent Labs  08/21/12 0552 08/22/12 0544 08/23/12 0614  WBC 4.5 5.0 5.3  HGB 11.0* 11.0* 11.0*  HCT 32.8* 31.6* 31.3*  PLT 197 204 191   BMET  Recent Labs  08/20/12 1538 08/21/12 0552  NA 142 140  K 5.5* 4.0  CL 111 106  CO2 25 25  GLUCOSE 106* 82  BUN 18 11  CREATININE 0.49* 0.53  CALCIUM 8.8 9.2   PT/INR  Recent Labs  08/20/12 2004  LABPROT 13.9  INR 1.08     ASSESSMENT / PLAN:  1.  Upper GI bleed secondary to bleeding prepyloric ulcer, s/p EGD with hemoclip placement and epi injection. Biopsies still pending. No further bleeding. Hgb stable at 11. Today is 3rd day of PPI drip. Can change to PO BID. She is hoping for discharge today. Will make her a follow up appointment with [place]  2.  CAD / hx stents, on chronic Plavix    LOS: 4 days   Willette Cluster  08/23/2012, 9:08 AM

## 2012-08-23 NOTE — Discharge Summary (Signed)
Physician Discharge Summary  Kathy Marshall ZOX:[id number] DOB: [birth year] DOA: 08/19/2012  PCP: Leo Grosser, MD  Admit date: 08/19/2012 Discharge date: 08/23/2012  Time spent: 50 minutes  Recommendations for Outpatient Follow-up:  1. Outpatient GI followup  Discharge Diagnoses:  Principal Problem:   GI bleed/hematemesis Active Problems:   Acute gastric ulcer with bleeding   COPD   WEIGHT LOSS   CAD (coronary artery disease), tandem Cyper DES to prox. and Mid RCA 2006, stable at cath 208    Discharge Condition: Stable  Diet recommendation: Low sodium heart healthy, avoid spicy foods and caffeine  Filed Weights   08/20/12 0252 08/21/12 2332 08/22/12 2314  Weight: 46.4 kg (102 lb 4.7 oz) 50.1 kg (110 lb 7.2 oz) 50 kg (110 lb 3.7 oz)    History of present illness:  Kathy Marshall is a 78 y.o. female with history of CAD status post stenting, COPD, hypertension, ongoing tobacco abuse presented to the ER because of nausea vomiting. Patient has had at least 3 episodes of throwing up dark colored coffee-ground-type vomitus in the past 24 hrs. In the ER patient was stool for occult blood positive. Patient states that prior to each episode patient felt crampy pain abdomen after which he threw up. Denies having any diarrhea or blood in the stools. Patient takes aspirin and Plavix for her CAD but denies taking any NSAIDs. In the ER repeat hemoglobin showed a 2 g drop from 13 -11. Patient at this time will be admitted for further management. EGD and colonoscopy done 2 years ago was showing severe gastritis with stenosis in the duodenum. Coloscopy was just showing polyp.   Hospital Course:   GI bleed - EGD performed by Dr. Dickie La on 2/25 reveals a large gastric ulcer which was not bleeding - the patient later admitted to using Goody powders and has been advised to stop using these -Aspirin and Plavix have been held for now -She needs to followup with Dr. Dickie La for biopsy  results -Will be on twice a day protonix for now  Anorexia/weight loss  - Likely exacerbated by peptic ulcer  - Megace initiated   Chronic pain  continue present medications - counseled on need to avoid NSAIDs (and explained what they are) - pt now admits to daily use of Goody Powder AND advil due to chronic low back pain - advised to stick to APAP, and counseled on closely monitoring amount of APAP consuming per day in that her Pain Clinic prescribes a narcotic/APAP combination - suggested she keep her daily APAP consumption below 3G given her smaller size   Ongoing nicotine abuse I have given her prescriptions for nicotine patches which she states she will use  Bradycardia Noted to have heart rate dipping down into the 40s when asleep and in high 50s when awake. This is not affecting her blood pressure or resulting in any dizziness however patient and daughter are overly concerned and because of this reason I have cut her metoprolol down from 25-12.5 mg twice a day  Procedures: EGD-08/21/12 Single ulcer ranging between 3-22mm in size was found in the  prepyloric region of the stomach; Hemostasis was attempted by  placing two hemoclips on the bleeding site and Epinephrine  injection of 4 cc in 3 separate sites achieving complete  hemostasis  Consultations:  [facility]  Discharge Exam: Filed Vitals:   08/23/12 1000 08/23/12 1100 08/23/12 1138 08/23/12 1200  BP:   125/69   Pulse: 65 61 56 53  Temp:  98.3 F (36.8 C)   TempSrc:   Oral   Resp: 17 21 15 19   Height:      Weight:      SpO2: 96% 94% 93% 94%    General: Awake alert oriented x3, no acute distress Cardiovascular: Regular rate and rhythm - bradycardic-no murmurs rubs or gallops Respiratory: Clear to auscultation bilaterally  Discharge Instructions      Discharge Orders   Future Appointments Provider Department Dept Phone   09/19/2012 11:00 AM Hart Carwin, MD [facility] [phone]    Future Orders Complete By Expires     Diet - low sodium heart healthy  As directed     Comments:      Avoid spicy food, caffeine and aspirin, Ibuprofen products    Increase activity slowly  As directed         Medication List    STOP taking these medications       aspirin 81 MG EC tablet     azithromycin 250 MG tablet  Commonly known as:  ZITHROMAX     clopidogrel 75 MG tablet  Commonly known as:  PLAVIX      TAKE these medications       ALPRAZolam 0.5 MG tablet  Commonly known as:  XANAX  Take 0.5 mg by mouth at bedtime as needed for sleep.     atorvastatin 80 MG tablet  Commonly known as:  LIPITOR  Take 80 mg by mouth at bedtime.     cholecalciferol 1000 UNITS tablet  Commonly known as:  VITAMIN D  Take 1,000 Units by mouth daily.     COMPLETE PROTEIN/VITAMIN SHAKE PO  Take 2-3 Bottles by mouth daily.     gabapentin 300 MG capsule  Commonly known as:  NEURONTIN  Take 300 mg by mouth at bedtime.     isosorbide mononitrate 60 MG 24 hr tablet  Commonly known as:  IMDUR  Take 60 mg by mouth daily.     megestrol 40 MG tablet  Commonly known as:  MEGACE  Take 1 tablet (40 mg total) by mouth daily.     metoprolol tartrate 25 MG tablet  Commonly known as:  LOPRESSOR  Take 0.5 tablets (12.5 mg total) by mouth 2 (two) times daily.     morphine 30 MG 12 hr tablet  Commonly known as:  MS CONTIN  Take 30 mg by mouth. Every 12 hours as needed for pain     nicotine 21 mg/24hr patch  Commonly known as:  NICODERM CQ - dosed in mg/24 hours  Place 1 patch onto the skin daily.     nicotine 14 mg/24hr patch  Commonly known as:  NICODERM CQ  Place 1 patch onto the skin daily.     nicotine 7 mg/24hr patch  Commonly known as:  NICODERM CQ  Place 1 patch onto the skin daily.     omeprazole 20 MG capsule  Commonly known as:  PRILOSEC  Take 20 mg by mouth daily.     pantoprazole 40 MG tablet  Commonly known as:  PROTONIX  Take 1 tablet (40 mg total) by mouth 2 (two)  times daily.     PROAIR HFA 108 (90 BASE) MCG/ACT inhaler  Generic drug:  albuterol  Inhale 2 puffs into the lungs.     promethazine 25 MG tablet  Commonly known as:  PHENERGAN  Take 25 mg by mouth every 6 (six) hours as needed for nausea.     tiotropium 18  MCG inhalation capsule  Commonly known as:  SPIRIVA  Place 18 mcg into inhaler and inhale daily.       Follow-up Information   Follow up with Lennette Bihari, MD On 10/10/2012. (at 9:00 AM)    Contact information:   [address] [place] [address] [phone]       Follow up with [name], MD On 09/19/2012. (11 AM to follow up ulcer)    Contact information:   [address] [place] [address] [phone]        The results of significant diagnostics from this hospitalization (including imaging, microbiology, ancillary and laboratory) are listed below for reference.    Significant Diagnostic Studies: No results found.  Microbiology: Recent Results (from the past 240 hour(s))  URINE CULTURE     Status: None   Collection Time    08/19/12  8:16 PM      Result Value Range Status   Specimen Description URINE, CLEAN CATCH   Final   Special Requests NONE   Final   Culture  Setup Time 08/20/2012 02:00   Final   Colony Count 4,000 COLONIES/ML   Final   Culture INSIGNIFICANT GROWTH   Final   Report Status 08/21/2012 FINAL   Final  MRSA PCR SCREENING     Status: None   Collection Time    08/20/12  3:38 AM      Result Value Range Status   MRSA by PCR NEGATIVE  NEGATIVE Final   Comment:            The GeneXpert MRSA Assay (FDA     approved for NASAL specimens     only), is one component of a     comprehensive MRSA colonization     surveillance program. It is not     intended to diagnose MRSA     infection nor to guide or     monitor treatment for     MRSA infections.     Labs: Basic Metabolic Panel:  Recent Labs Lab 08/19/12 1931 08/20/12 0520 08/20/12 1538 08/21/12 0552  NA  140 142 142 140  K 3.4* 2.8* 5.5* 4.0  CL 102 108 111 106  CO2 26 25 25 25   GLUCOSE 133* 107* 106* 82  BUN 35* 24* 18 11  CREATININE 0.56 0.50 0.49* 0.53  CALCIUM 9.2 8.7 8.8 9.2   Liver Function Tests:  Recent Labs Lab 08/19/12 1931 08/20/12 0520  AST 17 16  ALT 9 7  ALKPHOS 87 73  BILITOT 0.2* 0.2*  PROT 7.6 6.7  ALBUMIN 3.6 3.1*    Recent Labs Lab 08/20/12 0520  LIPASE 20   No results found for this basename: AMMONIA,  in the last 168 hours CBC:  Recent Labs Lab 08/19/12 1931  08/20/12 0520 08/20/12 2004 08/21/12 0552 08/22/12 0544 08/23/12 0614  WBC 6.1  < > 5.4  5.5 4.7 4.5 5.0 5.3  NEUTROABS 5.1  --   --   --   --   --   --   HGB 13.6  < > 11.3*  11.4* 10.2* 11.0* 11.0* 11.0*  HCT 38.8  < > 34.1*  34.6* 30.6* 32.8* 31.6* 31.3*  MCV 90.2  < > 92.2  93.0 93.0 91.9 89.3 89.2  PLT 225  < > 204  199 183 197 204 191  < > = values in this interval not displayed. Cardiac Enzymes:  Recent Labs Lab 08/20/12 0520  TROPONINI <0.30   BNP:  BNP (last 3 results) No results found for this basename: PROBNP,  in the last 8760 hours CBG: No results found for this basename: GLUCAP,  in the last 168 hours     Signed:  Rigo Letts  [facility] 08/23/2012, 1:56 PM

## 2012-08-27 ENCOUNTER — Encounter: Payer: Self-pay | Admitting: *Deleted

## 2012-09-19 ENCOUNTER — Ambulatory Visit: Payer: Medicare Other | Admitting: Internal Medicine

## 2012-09-21 ENCOUNTER — Telehealth: Payer: Self-pay | Admitting: Internal Medicine

## 2012-09-21 ENCOUNTER — Other Ambulatory Visit (HOSPITAL_COMMUNITY): Payer: Self-pay | Admitting: Physician Assistant

## 2012-09-21 DIAGNOSIS — R079 Chest pain, unspecified: Secondary | ICD-10-CM

## 2012-09-21 MED ORDER — PANTOPRAZOLE SODIUM 40 MG PO TBEC: 40.0000 mg | DELAYED_RELEASE_TABLET | Freq: Two times a day (BID) | ORAL | Status: DC

## 2012-09-21 NOTE — Telephone Encounter (Signed)
Dr Juanda Chance, are you okay with me filling rx? Patient was scheduled for hospital follow up on March 26 but cancelled due to no transportation. She is currently scheduled for 10/26/12 follow up.

## 2012-09-21 NOTE — Telephone Encounter (Signed)
RX Sent. Patient's daughter advised that patient must keep 10/26/12 appt for any further refills.

## 2012-09-21 NOTE — Telephone Encounter (Signed)
OK to refill if she showa up for her appointmnet

## 2012-09-24 NOTE — Progress Notes (Signed)
Received fax from [facility] that patient needed PA on Protonix twice daily dosing. Called [employer] and Protonix approved for one year. Faxed approval to [facility] on [address]

## 2012-09-26 ENCOUNTER — Telehealth: Payer: Self-pay | Admitting: Family Medicine

## 2012-09-26 MED ORDER — ALPRAZOLAM 0.5 MG PO TABS: 0.5000 mg | ORAL_TABLET | Freq: Every evening | ORAL | Status: DC | PRN

## 2012-09-26 NOTE — Telephone Encounter (Signed)
rx refilled.

## 2012-09-26 NOTE — Telephone Encounter (Signed)
?  ok to refill °

## 2012-09-26 NOTE — Telephone Encounter (Signed)
Ok to refill 

## 2012-09-27 ENCOUNTER — Encounter (HOSPITAL_COMMUNITY): Payer: Medicare Other

## 2012-10-02 ENCOUNTER — Other Ambulatory Visit: Payer: Self-pay | Admitting: Family Medicine

## 2012-10-02 ENCOUNTER — Telehealth: Payer: Self-pay | Admitting: Family Medicine

## 2012-10-02 MED ORDER — CYCLOBENZAPRINE HCL 5 MG PO TABS: 5.0000 mg | ORAL_TABLET | Freq: Three times a day (TID) | ORAL | Status: DC | PRN

## 2012-10-02 NOTE — Telephone Encounter (Signed)
Schedule office visit to discuss topamax Ok to refill flexeril until office visit

## 2012-10-02 NOTE — Telephone Encounter (Signed)
Still with chronic headaches..Muscle relaxers (flexeril) have not helped.  Out of muscle relaxers ned RX for something to help headaches

## 2012-10-06 ENCOUNTER — Encounter: Payer: Self-pay | Admitting: Family Medicine

## 2012-10-09 ENCOUNTER — Encounter (HOSPITAL_COMMUNITY): Payer: Medicare Other

## 2012-10-15 ENCOUNTER — Encounter: Payer: Self-pay | Admitting: *Deleted

## 2012-10-15 ENCOUNTER — Telehealth: Payer: Self-pay | Admitting: Internal Medicine

## 2012-10-15 NOTE — Telephone Encounter (Signed)
I have called and spoken to [place] T. at [facility] [phone]) for this patient (member #[email or web address]). Apparently, the authorization completed on 09/24/12 was only for the medication, not the twice daily dosing. Therefore, an entirely new authorization must be completed. Patient has GERD and reflux esophagitis. She has tried Prilosec previously as well as pantoprazole at once daily dosing. Prior authorization for patient's pantoprazole and twice daily dosing has been completed and is authorized through 06/26/2013.

## 2012-10-16 ENCOUNTER — Encounter: Payer: Self-pay | Admitting: Family Medicine

## 2012-10-16 ENCOUNTER — Ambulatory Visit (INDEPENDENT_AMBULATORY_CARE_PROVIDER_SITE_OTHER): Payer: Medicare Other | Admitting: Family Medicine

## 2012-10-16 VITALS — BP 134/84 | HR 84 | Temp 98.4°F | Resp 18 | Ht 61.0 in | Wt 106.0 lb

## 2012-10-16 DIAGNOSIS — R51 Headache: Secondary | ICD-10-CM

## 2012-10-16 DIAGNOSIS — G43909 Migraine, unspecified, not intractable, without status migrainosus: Secondary | ICD-10-CM

## 2012-10-16 MED ORDER — TOPIRAMATE 25 MG PO TABS: 100.0000 mg | ORAL_TABLET | Freq: Every day | ORAL | Status: DC

## 2012-10-16 MED ORDER — ONDANSETRON HCL 4 MG PO TABS: 4.0000 mg | ORAL_TABLET | Freq: Three times a day (TID) | ORAL | Status: DC | PRN

## 2012-10-16 NOTE — Progress Notes (Signed)
Subjective:    Patient ID: Kathy Marshall, female    DOB: 1935/05/14, 78 y.o.   MRN: [id number]  HPI The patient presents for followup of her headaches. Patient was last seen March 11. She been admitted in February with a bleeding gastric ulcer. At the time she was taking Goody powders on a daily basis for a daily headache.  She's been using the powders for years possibly as long as a decade. She states she's had headaches her entire life. The headache now is constant, pressure-like in sensation, and associated with nausea. Her last head CT scan was negative in 2012. She has a daughter and a granddaughter who both suffer with migraines. I asked her to give 1 month off of Goody powder to see if this was just a medication rebound headache.  The headache has worsened.  She is here today discuss other therapeutic options.  He also reports daily nausea and vomiting associated with the headache. Past Medical History  Diagnosis Date  . Low back pain   . Bronchitis   . Chronic hoarseness   . Sleep apnea, obstructive   . Renal artery stenosis   . PVD (peripheral vascular disease)   . Hypercholesteremia   . Pain in limb   . COPD (chronic obstructive pulmonary disease)   . Chronic headache   . Benign essential hypertension   . Atherosclerosis     coronary vessel type  . Coronary artery disease   . Diverticulosis   . History of GI diverticular bleed   . Myocardial infarct   . History of esophagitis   . H. pylori infection   . Altered mental status   . Pneumonia   . TIA (transient ischemic attack) 07/2010  . Arthritis   . GERD (gastroesophageal reflux disease)   . History of blood clots   . GI bleed   . Gastric ulcer   . Tubular adenoma 2012   Current Outpatient Prescriptions on File Prior to Visit  Medication Sig Dispense Refill  . albuterol (PROAIR HFA) 108 (90 BASE) MCG/ACT inhaler Inhale 2 puffs into the lungs.        . ALPRAZolam (XANAX) 0.5 MG tablet Take 1 tablet (0.5 mg total)  by mouth at bedtime as needed for sleep.  30 tablet  0  . atorvastatin (LIPITOR) 80 MG tablet Take 80 mg by mouth at bedtime.      . cholecalciferol (VITAMIN D) 1000 UNITS tablet Take 1,000 Units by mouth daily.      . cyclobenzaprine (FLEXERIL) 5 MG tablet Take 1 tablet (5 mg total) by mouth every 8 (eight) hours as needed.  30 tablet  0  . gabapentin (NEURONTIN) 300 MG capsule Take 300 mg by mouth at bedtime.      . isosorbide mononitrate (IMDUR) 60 MG 24 hr tablet Take 60 mg by mouth daily.      . megestrol (MEGACE) 40 MG tablet Take 1 tablet (40 mg total) by mouth daily.  60 tablet  0  . metoprolol tartrate (LOPRESSOR) 25 MG tablet Take 0.5 tablets (12.5 mg total) by mouth 2 (two) times daily.      Marland Kitchen morphine (MS CONTIN) 30 MG 12 hr tablet Take 30 mg by mouth. Every 12 hours as needed for pain       . nicotine (NICODERM CQ - DOSED IN MG/24 HOURS) 21 mg/24hr patch Place 1 patch onto the skin daily.  28 patch  0  . nicotine (NICODERM CQ) 14 mg/24hr patch Place 1  patch onto the skin daily.  28 patch  0  . nicotine (NICODERM CQ) 7 mg/24hr patch Place 1 patch onto the skin daily.  28 patch  0  . Nutritional Supplements (COMPLETE PROTEIN/VITAMIN SHAKE PO) Take 2-3 Bottles by mouth daily.      Marland Kitchen omeprazole (PRILOSEC) 20 MG capsule Take 20 mg by mouth daily.      . pantoprazole (PROTONIX) 40 MG tablet Take 1 tablet (40 mg total) by mouth 2 (two) times daily.  60 tablet  0  . promethazine (PHENERGAN) 25 MG tablet Take 25 mg by mouth every 6 (six) hours as needed for nausea.      Marland Kitchen tiotropium (SPIRIVA) 18 MCG inhalation capsule Place 18 mcg into inhaler and inhale daily.       No current facility-administered medications on file prior to visit.   No Known Allergies    Review of Systems  All other systems reviewed and are negative.       Objective:   Physical Exam  Constitutional: She is oriented to person, place, and time. She appears well-developed and well-nourished.  HENT:  Head:  Normocephalic and atraumatic.  Cardiovascular: Normal rate and regular rhythm.   Pulmonary/Chest: Effort normal and breath sounds normal.  Neurological: She is alert and oriented to person, place, and time. She has normal reflexes. No cranial nerve deficit. She exhibits normal muscle tone.          Assessment & Plan:  1. Migraines  2. Chronic daily headache Begin Topamax 25 mg by mouth each bedtime. She's to increase this to 25 mg each week until either she is taking 100 mg by mouth each bedtime or ishe encounters side effects or headaches stop.  I warned about dizziness and somnolent from medication. I warned about weight loss. We'll recheck the patient in one month. I prescribe Zofran 4 mg by mouth every 8 hours when necessary nausea .or vomiting

## 2012-10-16 NOTE — Patient Instructions (Addendum)
Take 1 pill at night for 1 week Then 2 pills at night for 2nd week, Then 3 pills at night for 3rd week, then 4 pills at night  UNLESS side effects or it stops headache at a lower dose.

## 2012-10-18 ENCOUNTER — Ambulatory Visit (HOSPITAL_COMMUNITY)
Admission: RE | Admit: 2012-10-18 | Discharge: 2012-10-18 | Disposition: A | Discharge status: Home / Self Care | Payer: Medicare Other | Source: Ambulatory Visit | Attending: Physician Assistant | Admitting: Physician Assistant

## 2012-10-18 DIAGNOSIS — R079 Chest pain, unspecified: Secondary | ICD-10-CM | POA: Insufficient documentation

## 2012-10-18 MED ORDER — TECHNETIUM TC 99M SESTAMIBI GENERIC - CARDIOLITE
30.3000 | Freq: Once | INTRAVENOUS | Status: AC | PRN
  Administered 2012-10-18: 30.3 via INTRAVENOUS

## 2012-10-18 MED ORDER — TECHNETIUM TC 99M SESTAMIBI GENERIC - CARDIOLITE
10.4000 | Freq: Once | INTRAVENOUS | Status: AC | PRN
  Administered 2012-10-18: 10 via INTRAVENOUS

## 2012-10-18 MED ORDER — AMINOPHYLLINE 25 MG/ML IV SOLN
75.0000 mg | Freq: Once | INTRAVENOUS | Status: AC
  Administered 2012-10-18: 75 mg via INTRAVENOUS

## 2012-10-18 MED ORDER — REGADENOSON 0.4 MG/5ML IV SOLN
0.4000 mg | Freq: Once | INTRAVENOUS | Status: AC
  Administered 2012-10-18: 0.4 mg via INTRAVENOUS

## 2012-10-18 NOTE — Procedures (Addendum)
[place] [facility] [address] [place] [address] [phone]  Cardiology Nuclear Med Study  CHASITIE Marshall is a 78 y.o. female     MRN : [id number]     DOB: 02-06-1935  Procedure Date: 10/18/2012  Nuclear Med Background Indication for Stress Test:  Stent Patency History:  COPD and CAD;MI;STENT/PTCA--2006;SVT;MVR Cardiac Risk Factors: Family History - CAD, Hypertension, Lipids and Smoker  Symptoms:  Chest Pain, Dizziness, DOE, Fatigue, Light-Headedness, Palpitations and SOB   Nuclear Pre-Procedure Caffeine/Decaff Intake:  10:00pm NPO After: 8:00am   IV Site: R Forearm  IV 0.9% NS with Angio Cath:  24g  Chest Size (in):  N/A IV Started by: Emmit Pomfret, RN  Height: 5\' 1"  (1.549 m)  Cup Size: B  BMI:  Body mass index is 20.8 kg/(m^2). Weight:  110 lb (49.896 kg)   Tech Comments:  N/A    Nuclear Med Study 1 or 2 day study: 1 day  Stress Test Type:  Lexiscan  Order Authorizing Provider:  Nicki Guadalajara, MD   Resting Radionuclide: Technetium 88m Sestamibi  Resting Radionuclide Dose: 10.4 mCi   Stress Radionuclide:  Technetium 17m Sestamibi  Stress Radionuclide Dose: 30.3 mCi           Stress Protocol Rest HR: 66 Stress HR: 96  Rest BP: 210/101 Stress BP: 175/105  Exercise Time (min): n/a METS: n/a   Predicted Max HR: 145 bpm % Max HR: 66.21 bpm Rate Pressure Product: 69629  Dose of Adenosine (mg):  n/a Dose of Lexiscan: 0.4 mg  Dose of Atropine (mg): n/a Dose of Dobutamine: n/a mcg/kg/min (at max HR)  Stress Test Technologist: Esperanza Sheets, CCT Nuclear Technologist: Gonzella Lex, CNMT   Rest Procedure:  Myocardial perfusion imaging was performed at rest 45 minutes following the intravenous administration of Technetium 46m Sestamibi. Stress Procedure:  The patient received IV Lexiscan 0.4 mg over 15 seconds. Technetium 99 m Sestamibi injected at 30 seconds. The patient experienced SOB and received 75 mg of IV  Aminophylline with resolution of symptoms. There were no significant changes with Lexiscan. Quantitative spect images were obtained after a 45 minute delay. Transient Ischemic Dilatation (Normal <1.22):  0.99 Lung/Heart Ratio (Normal <0.45):  0.24 QGS EDV:  62 ml QGS ESV:  17 ml LV Ejection Fraction: 72%  Signed by      Rest ECG: NSR - Normal EKG  Stress ECG: No significant change from baseline ECG  QPS Raw Data Images:  Normal; no motion artifact; normal heart/lung ratio. Stress Images:  Normal homogeneous uptake in all areas of the myocardium. Rest Images:  Normal homogeneous uptake in all areas of the myocardium. Subtraction (SDS):  Normal  Impression Exercise Capacity:  Lexiscan with no exercise. BP Response:  Hypertensive blood pressure response. Clinical Symptoms:  There is dyspnea. ECG Impression:  No significant ST segment change suggestive of ischemia. Comparison with Prior Nuclear Study: No significant change from previous study  Overall Impression:  Normal stress nuclear study. Low risk stress nuclear study.  LV Wall Motion:  NL LV Function, 72%; NL Wall Motion   Lennette Bihari, MD  10/18/2012 6:43 PM

## 2012-10-19 ENCOUNTER — Other Ambulatory Visit: Payer: Self-pay | Admitting: Family Medicine

## 2012-10-19 NOTE — Telephone Encounter (Signed)
Ok to refill 

## 2012-10-19 NOTE — Telephone Encounter (Signed)
Rx Refilled  

## 2012-10-19 NOTE — Telephone Encounter (Signed)
?   OK to Refill  

## 2012-10-26 ENCOUNTER — Telehealth: Payer: Self-pay | Admitting: *Deleted

## 2012-10-26 ENCOUNTER — Ambulatory Visit: Payer: Medicare Other | Admitting: Internal Medicine

## 2012-10-26 DIAGNOSIS — K922 Gastrointestinal hemorrhage, unspecified: Secondary | ICD-10-CM

## 2012-10-26 NOTE — Telephone Encounter (Signed)
Message copied by Richardson Chiquito on Fri Oct 26, 2012  1:01 PM ------      Message from: Hart Carwin      Created: Fri Oct 26, 2012 12:55 PM       She is a post GIB, she needs to have her CBC checked. If she comes for the blood test then we will not charge late cancel;lation fee.She needs to continue on her ulcer medication.      ----- Message -----         From: Richardson Chiquito, CMA         Sent: 10/26/2012  12:35 PM           To: Hart Carwin, MD            Dr Juanda Chance, patient cancelled same day for appt 10/26/12. Do you want to charge late cancellation fee?      ----- Message -----         From: Holli Humbles         Sent: 10/26/2012  12:25 PM           To: Richardson Chiquito, CMA            Pt's daughter resch'd her appt until 12-11-12 bc she is vomiting             ------

## 2012-10-26 NOTE — Telephone Encounter (Signed)
Patient's daughter states that she will bring her mother in for a CBC next week. She has also been advised that her mother should continue Protonix. Daughter states that her mother has only been getting Pantoprazole once daily dosing because insurance will only cover once daily dosing. I have advised her that we have gotten approval through her insurance company for her to have twice daily dosing. Daughter states that she will start mother back on twice daily dosing of Pantoprazole. I have also advised that patient MUST be seen on 12/11/12 to follow up on her GI bleed as this is a very serious condition. She verbalizes understanding and also states that she will contact us sooner if mother's symptoms continue or worsen.

## 2012-11-05 ENCOUNTER — Telehealth: Payer: Self-pay | Admitting: *Deleted

## 2012-11-05 ENCOUNTER — Other Ambulatory Visit (INDEPENDENT_AMBULATORY_CARE_PROVIDER_SITE_OTHER): Payer: Medicare Other

## 2012-11-05 DIAGNOSIS — K922 Gastrointestinal hemorrhage, unspecified: Secondary | ICD-10-CM

## 2012-11-05 LAB — CBC WITH DIFFERENTIAL/PLATELET
Basophils Absolute: 0 10*3/uL (ref 0.0–0.1)
Lymphocytes Relative: 30.1 % (ref 12.0–46.0)
Lymphs Abs: 2.2 10*3/uL (ref 0.7–4.0)
Monocytes Relative: 6.9 % (ref 3.0–12.0)
Neutrophils Relative %: 60.4 % (ref 43.0–77.0)
Platelets: 200 10*3/uL (ref 150.0–400.0)
RDW: 14.1 % (ref 11.5–14.6)

## 2012-11-05 NOTE — Telephone Encounter (Signed)
Spoke with [name] patient's daughter and she states patient is coming today for labs.

## 2012-11-05 NOTE — Telephone Encounter (Signed)
Message copied by Daphine Deutscher on Mon Nov 05, 2012 11:07 AM ------      Message from: Jessee Avers      Created: Mon Nov 05, 2012 10:30 AM                   ----- Message -----         From: Richardson Chiquito, CMA         Sent: 11/05/2012           To: Richardson Chiquito, CMA                        ----- Message -----         From: Richardson Chiquito, CMA         Sent: 11/01/2012           To: Richardson Chiquito, CMA            Did pt get labs done week of 5/5? ------

## 2012-11-06 ENCOUNTER — Ambulatory Visit: Payer: Medicare Other | Admitting: Family Medicine

## 2012-11-07 ENCOUNTER — Other Ambulatory Visit: Payer: Self-pay | Admitting: Family Medicine

## 2012-11-07 ENCOUNTER — Other Ambulatory Visit: Payer: Self-pay | Admitting: *Deleted

## 2012-11-07 MED ORDER — ISOSORBIDE MONONITRATE ER 60 MG PO TB24: 60.0000 mg | ORAL_TABLET | Freq: Every day | ORAL | Status: DC

## 2012-11-07 NOTE — Telephone Encounter (Signed)
?  ok to refill °

## 2012-11-07 NOTE — Telephone Encounter (Signed)
Ok to refill but she [facility] yesterday.

## 2012-11-07 NOTE — Telephone Encounter (Signed)
Rx sent pharmacy  

## 2012-11-07 NOTE — Telephone Encounter (Signed)
Med refilled and pt aware needs to be seen

## 2012-11-10 ENCOUNTER — Other Ambulatory Visit: Payer: Self-pay | Admitting: Internal Medicine

## 2012-11-12 ENCOUNTER — Other Ambulatory Visit: Payer: Self-pay | Admitting: Family Medicine

## 2012-11-12 NOTE — Telephone Encounter (Signed)
Medication refilled per protocol. 

## 2012-11-13 ENCOUNTER — Encounter: Payer: Self-pay | Admitting: Emergency Medicine

## 2012-11-15 ENCOUNTER — Ambulatory Visit: Payer: Medicare Other | Admitting: Cardiovascular Disease

## 2012-11-15 ENCOUNTER — Encounter: Payer: Self-pay | Admitting: Cardiovascular Disease

## 2012-11-20 ENCOUNTER — Ambulatory Visit: Payer: Medicare Other | Admitting: Cardiovascular Disease

## 2012-11-21 ENCOUNTER — Encounter (HOSPITAL_COMMUNITY): Payer: Self-pay | Admitting: *Deleted

## 2012-11-21 ENCOUNTER — Telehealth: Payer: Self-pay | Admitting: Family Medicine

## 2012-11-21 ENCOUNTER — Emergency Department (HOSPITAL_COMMUNITY): Payer: Medicare Other

## 2012-11-21 ENCOUNTER — Emergency Department (HOSPITAL_COMMUNITY)
Admission: EM | Admit: 2012-11-21 | Discharge: 2012-11-21 | Disposition: A | Discharge status: Home / Self Care | Payer: Medicare Other | Attending: Emergency Medicine | Admitting: Emergency Medicine

## 2012-11-21 DIAGNOSIS — E7800 Pure hypercholesterolemia, unspecified: Secondary | ICD-10-CM | POA: Insufficient documentation

## 2012-11-21 DIAGNOSIS — Z8709 Personal history of other diseases of the respiratory system: Secondary | ICD-10-CM | POA: Insufficient documentation

## 2012-11-21 DIAGNOSIS — J4489 Other specified chronic obstructive pulmonary disease: Secondary | ICD-10-CM | POA: Insufficient documentation

## 2012-11-21 DIAGNOSIS — I10 Essential (primary) hypertension: Secondary | ICD-10-CM | POA: Insufficient documentation

## 2012-11-21 DIAGNOSIS — Z8679 Personal history of other diseases of the circulatory system: Secondary | ICD-10-CM | POA: Insufficient documentation

## 2012-11-21 DIAGNOSIS — J449 Chronic obstructive pulmonary disease, unspecified: Secondary | ICD-10-CM | POA: Insufficient documentation

## 2012-11-21 DIAGNOSIS — K219 Gastro-esophageal reflux disease without esophagitis: Secondary | ICD-10-CM | POA: Insufficient documentation

## 2012-11-21 DIAGNOSIS — G4733 Obstructive sleep apnea (adult) (pediatric): Secondary | ICD-10-CM | POA: Insufficient documentation

## 2012-11-21 DIAGNOSIS — Z8673 Personal history of transient ischemic attack (TIA), and cerebral infarction without residual deficits: Secondary | ICD-10-CM | POA: Insufficient documentation

## 2012-11-21 DIAGNOSIS — Z8719 Personal history of other diseases of the digestive system: Secondary | ICD-10-CM | POA: Insufficient documentation

## 2012-11-21 DIAGNOSIS — R410 Disorientation, unspecified: Secondary | ICD-10-CM

## 2012-11-21 DIAGNOSIS — Z8739 Personal history of other diseases of the musculoskeletal system and connective tissue: Secondary | ICD-10-CM | POA: Insufficient documentation

## 2012-11-21 DIAGNOSIS — Z8701 Personal history of pneumonia (recurrent): Secondary | ICD-10-CM | POA: Insufficient documentation

## 2012-11-21 DIAGNOSIS — I2510 Atherosclerotic heart disease of native coronary artery without angina pectoris: Secondary | ICD-10-CM | POA: Insufficient documentation

## 2012-11-21 DIAGNOSIS — I252 Old myocardial infarction: Secondary | ICD-10-CM | POA: Insufficient documentation

## 2012-11-21 DIAGNOSIS — Z79899 Other long term (current) drug therapy: Secondary | ICD-10-CM | POA: Insufficient documentation

## 2012-11-21 DIAGNOSIS — I739 Peripheral vascular disease, unspecified: Secondary | ICD-10-CM | POA: Insufficient documentation

## 2012-11-21 DIAGNOSIS — F17200 Nicotine dependence, unspecified, uncomplicated: Secondary | ICD-10-CM | POA: Insufficient documentation

## 2012-11-21 DIAGNOSIS — Z86718 Personal history of other venous thrombosis and embolism: Secondary | ICD-10-CM | POA: Insufficient documentation

## 2012-11-21 DIAGNOSIS — F29 Unspecified psychosis not due to a substance or known physiological condition: Secondary | ICD-10-CM | POA: Insufficient documentation

## 2012-11-21 LAB — COMPREHENSIVE METABOLIC PANEL
ALT: 9 U/L (ref 0–35)
AST: 16 U/L (ref 0–37)
Alkaline Phosphatase: 74 U/L (ref 39–117)
CO2: 24 mEq/L (ref 19–32)
Chloride: 105 mEq/L (ref 96–112)
Creatinine, Ser: 0.7 mg/dL (ref 0.50–1.10)
GFR calc non Af Amer: 83 mL/min — ABNORMAL LOW (ref >90)
Sodium: 139 mEq/L (ref 135–145)
Total Bilirubin: 0.1 mg/dL — ABNORMAL LOW (ref 0.3–1.2)

## 2012-11-21 LAB — CBC WITH DIFFERENTIAL/PLATELET
Basophils Absolute: 0 10*3/uL (ref 0.0–0.1)
HCT: 39.4 % (ref 36.0–46.0)
Hemoglobin: 13 g/dL (ref 12.0–15.0)
Lymphocytes Relative: 25 % (ref 12–46)
Monocytes Absolute: 0.5 10*3/uL (ref 0.1–1.0)
Neutro Abs: 6.3 10*3/uL (ref 1.7–7.7)
RDW: 13.6 % (ref 11.5–15.5)
WBC: 9.5 10*3/uL (ref 4.0–10.5)

## 2012-11-21 LAB — URINALYSIS, ROUTINE W REFLEX MICROSCOPIC
Hgb urine dipstick: NEGATIVE
Ketones, ur: NEGATIVE mg/dL
Protein, ur: NEGATIVE mg/dL
Urobilinogen, UA: 1 mg/dL (ref 0.0–1.0)

## 2012-11-21 NOTE — ED Notes (Signed)
Pt to CT

## 2012-11-21 NOTE — ED Notes (Signed)
Pt here per [facility] with family wanting pt evaluated for confusion.  Per [facility], family advises pt. has been having episodes when she is confused.

## 2012-11-21 NOTE — Telephone Encounter (Signed)
Daughter reports mother acting very strangely and hallucinating.  Has blank stare on face.  Worsening over past 24 plus hours.  Told daughter to take mother to Emergency Room at [facility] immediately for evaluation.

## 2012-11-21 NOTE — ED Provider Notes (Signed)
History     CSN: [id number]  Arrival date & time 11/21/12  1205   First MD Initiated Contact with Patient 11/21/12 1213      Chief Complaint  Patient presents with  . Altered Mental Status    (Consider location/radiation/quality/duration/timing/severity/associated sxs/prior treatment) HPI Comments: Patient brought to the ER by EMS for evaluation of confusion. Patient's family reportedly called an ambulance because patient has intermittently been more confused than usual. At arrival to the ER, patient is oriented. She enters all questions appropriately. She is without complaints. She does, however, state that she remembers asking her family unusual questions and she's not sure by at times she does this.  Patient is a 78 y.o. female presenting with altered mental status.  Altered Mental Status Presenting symptoms: confusion   Associated symptoms: no abdominal pain, no fever and no headaches     Past Medical History  Diagnosis Date  . Low back pain   . Bronchitis   . Chronic hoarseness   . Sleep apnea, obstructive   . Renal artery stenosis   . PVD (peripheral vascular disease)   . Hypercholesteremia   . Pain in limb   . COPD (chronic obstructive pulmonary disease)   . Chronic headache   . Benign essential hypertension   . Atherosclerosis     coronary vessel type  . Coronary artery disease   . Diverticulosis   . History of GI diverticular bleed   . Myocardial infarct   . History of esophagitis   . H. pylori infection   . Altered mental status   . Pneumonia   . TIA (transient ischemic attack) 07/2010  . Arthritis   . GERD (gastroesophageal reflux disease)   . History of blood clots   . GI bleed   . Gastric ulcer   . Tubular adenoma 2012    Past Surgical History  Procedure Laterality Date  . Oophorectomy    . Coronary angioplasty with stent placement  2006    Cypher stent to RCA. On cath in 2008 stent described as widely patent.   . Abdominal hysterectomy    .  Cholecystectomy    . Esophagogastroduodenoscopy N/A 08/21/2012    Procedure: ESOPHAGOGASTRODUODENOSCOPY (EGD);  Surgeon: Hart Carwin, MD;  Location: [facility] ENDOSCOPY;  Service: Endoscopy;  Laterality: N/A;    Family History  Problem Relation Age of Onset  . Heart disease Mother   . Stroke Mother   . Stomach cancer Mother   . Heart disease Father   . Stroke Father   . Heart disease Sister   . Colon cancer Neg Hx     History  Substance Use Topics  . Smoking status: Current Every Day Smoker -- 1.00 packs/day for 56 years    Types: Cigarettes  . Smokeless tobacco: Never Used  . Alcohol Use: No     Comment: occasional    OB History   Grav Para Term Preterm Abortions TAB SAB Ect Mult Living                  Review of Systems  Constitutional: Negative for fever.  Respiratory: Negative for shortness of breath.   Cardiovascular: Negative for chest pain.  Gastrointestinal: Negative for abdominal pain.  Neurological: Negative for headaches.  Psychiatric/Behavioral: Positive for confusion and altered mental status.  All other systems reviewed and are negative.    Allergies  Review of patient's allergies indicates no known allergies.  Home Medications   Current Outpatient Rx  Name  Route  Sig  Dispense  Refill  . albuterol (PROAIR HFA) 108 (90 BASE) MCG/ACT inhaler   Inhalation   Inhale 2 puffs into the lungs.           . ALPRAZolam (XANAX) 0.5 MG tablet      TAKE 1 TABLET BY MOUTH 3 TIMES A DAY AS NEEDED   90 tablet   0   . ALPRAZolam (XANAX) 0.5 MG tablet   Oral   Take 0.5 mg by mouth 3 (three) times daily as needed for sleep.         Marland Kitchen atorvastatin (LIPITOR) 80 MG tablet   Oral   Take 80 mg by mouth at bedtime.         . cholecalciferol (VITAMIN D) 1000 UNITS tablet   Oral   Take 1,000 Units by mouth daily.         . cyclobenzaprine (FLEXERIL) 5 MG tablet      TAKE 1 TABLET (5 MG TOTAL) BY MOUTH EVERY 8 (EIGHT) HOURS AS NEEDED.   30 tablet   0    . gabapentin (NEURONTIN) 300 MG capsule   Oral   Take 300 mg by mouth at bedtime.         . isosorbide mononitrate (IMDUR) 60 MG 24 hr tablet   Oral   Take 1 tablet (60 mg total) by mouth daily.   30 tablet   6   . losartan (COZAAR) 25 MG tablet   Oral   Take 1 tablet by mouth daily.         . megestrol (MEGACE) 40 MG tablet   Oral   Take 1 tablet (40 mg total) by mouth daily.   60 tablet   0   . metoprolol tartrate (LOPRESSOR) 25 MG tablet   Oral   Take 0.5 tablets (12.5 mg total) by mouth 2 (two) times daily.         Marland Kitchen morphine (MS CONTIN) 30 MG 12 hr tablet   Oral   Take 30 mg by mouth. Every 12 hours as needed for pain          . nicotine (NICODERM CQ - DOSED IN MG/24 HOURS) 21 mg/24hr patch   Transdermal   Place 1 patch onto the skin daily.   28 patch   0   . nicotine (NICODERM CQ) 14 mg/24hr patch   Transdermal   Place 1 patch onto the skin daily.   28 patch   0     Start after 21 mg patches are finished   . nicotine (NICODERM CQ) 7 mg/24hr patch   Transdermal   Place 1 patch onto the skin daily.   28 patch   0     Start after the 14 mg patches are finished   . Nutritional Supplements (COMPLETE PROTEIN/VITAMIN SHAKE PO)   Oral   Take 2-3 Bottles by mouth daily.         Marland Kitchen omeprazole (PRILOSEC) 20 MG capsule   Oral   Take 20 mg by mouth daily.         . ondansetron (ZOFRAN) 4 MG tablet      TAKE 1 TABLET EVERY 8 HOURS AS NEEDED NAUSEA   20 tablet   1   . pantoprazole (PROTONIX) 40 MG tablet      TAKE 1 TABLET BY MOUTH TWICE A DAY   60 tablet   0   . promethazine (PHENERGAN) 25 MG tablet   Oral   Take 25 mg by  mouth every 6 (six) hours as needed for nausea.         Marland Kitchen tiotropium (SPIRIVA) 18 MCG inhalation capsule   Inhalation   Place 18 mcg into inhaler and inhale daily.         Marland Kitchen topiramate (TOPAMAX) 25 MG tablet   Oral   Take 4 tablets (100 mg total) by mouth daily.   120 tablet   1     BP 116/60  Pulse 60   Temp(Src) 98.6 F (37 C) (Oral)  Resp 18  Ht 5\' 2"  (1.575 m)  Wt 109 lb (49.442 kg)  BMI 19.93 kg/m2  SpO2 99%  Physical Exam  Constitutional: She is oriented to person, place, and time. She appears well-developed and well-nourished. No distress.  HENT:  Head: Normocephalic and atraumatic.  Right Ear: Hearing normal.  Left Ear: Hearing normal.  Nose: Nose normal.  Mouth/Throat: Oropharynx is clear and moist and mucous membranes are normal.  Eyes: Conjunctivae and EOM are normal. Pupils are equal, round, and reactive to light.  Neck: Normal range of motion. Neck supple.  Cardiovascular: Regular rhythm, S1 normal and S2 normal.  Exam reveals no gallop and no friction rub.   No murmur heard. Pulmonary/Chest: Effort normal and breath sounds normal. No respiratory distress. She exhibits no tenderness.  Abdominal: Soft. Normal appearance and bowel sounds are normal. There is no hepatosplenomegaly. There is no tenderness. There is no rebound, no guarding, no tenderness at McBurney's point and negative Murphy's sign. No hernia.  Musculoskeletal: Normal range of motion.  Neurological: She is alert and oriented to person, place, and time. She has normal strength. No cranial nerve deficit or sensory deficit. Coordination normal. GCS eye subscore is 4. GCS verbal subscore is 5. GCS motor subscore is 6.  Skin: Skin is warm, dry and intact. No rash noted. No cyanosis.  Psychiatric: She has a normal mood and affect. Her speech is normal and behavior is normal. Thought content normal.    ED Course  Procedures (including critical care time)  EKG:  Date: 11/21/2012  Rate: 57  Rhythm: normal sinus rhythm  QRS Axis: normal  Intervals: normal  ST/T Wave abnormalities: nonspecific ST/T changes  Conduction Disutrbances:none  Narrative Interpretation:   Old EKG Reviewed: unchanged    Labs Reviewed  COMPREHENSIVE METABOLIC PANEL - Abnormal; Notable for the following:    Albumin 3.0 (*)    Total  Bilirubin 0.1 (*)    GFR calc non Af Amer 83 (*)    All other components within normal limits  URINALYSIS, ROUTINE W REFLEX MICROSCOPIC - Abnormal; Notable for the following:    Bilirubin Urine SMALL (*)    All other components within normal limits  CBC WITH DIFFERENTIAL   Dg Chest 2 View  11/21/2012   *RADIOLOGY REPORT*  Clinical Data: Chest pain, hypertension  CHEST - 2 VIEW  Comparison: 10/26/2010  Findings: The heart and pulmonary vascularity are within normal limits.  Mild interstitial changes are noted of a chronic nature. No focal infiltrate or sizable effusion is seen.  Coronary stents are noted.  IMPRESSION: Chronic changes without acute abnormality.   Original Report Authenticated By: Alcide Clever, M.D.   Ct Head Wo Contrast  11/21/2012   *RADIOLOGY REPORT*  Clinical Data: Confusion.  CT HEAD WITHOUT CONTRAST  Technique:  Contiguous axial images were obtained from the base of the skull through the vertex without contrast.  Comparison: 10/26/2010  Findings: Bone windows demonstrate fluid in the right mastoid air cells on  image 2/series 3, new since the prior.  Remainder paranasal sinuses clear.  Soft tissue windows demonstrate no  mass lesion, hemorrhage, hydrocephalus, acute infarct, intra-axial, or extra-axial fluid collection.  Mild to moderate low density in the periventricular white matter likely related to small vessel disease.  IMPRESSION:  1. No acute intracranial abnormality. 2.  New right-sided mastoid effusion.   Original Report Authenticated By: Jeronimo Greaves, M.D.     Diagnosis: Confusion    MDM  Patient presents to the ER for evaluation of confusion. Daughter is concerned because she has been experiencing intermittent episodes of forgetfulness, confusion and not acting like herself. During the evaluation here in the ER, however, she has been lucid without any problems. No focal findings on exam. Her workup was entirely unremarkable as well. She will be showing some signs of  early dementia. She also takes multiple sedating medications at home for chronic pain. I recommended that she follow up with her primary care doctor for further consideration. She does not warrant admission at this time.        Gilda Crease, MD 11/21/12 [phone]

## 2012-11-26 ENCOUNTER — Encounter: Payer: Self-pay | Admitting: Cardiovascular Disease

## 2012-11-26 ENCOUNTER — Ambulatory Visit (INDEPENDENT_AMBULATORY_CARE_PROVIDER_SITE_OTHER): Payer: Medicare Other | Admitting: Cardiovascular Disease

## 2012-11-26 VITALS — BP 108/60 | HR 57 | Ht 62.0 in | Wt 103.8 lb

## 2012-11-26 DIAGNOSIS — R200 Anesthesia of skin: Secondary | ICD-10-CM

## 2012-11-26 DIAGNOSIS — I2510 Atherosclerotic heart disease of native coronary artery without angina pectoris: Secondary | ICD-10-CM

## 2012-11-26 DIAGNOSIS — R209 Unspecified disturbances of skin sensation: Secondary | ICD-10-CM

## 2012-11-26 MED ORDER — CLOPIDOGREL BISULFATE 75 MG PO TABS: 75.0000 mg | ORAL_TABLET | Freq: Every day | ORAL | Status: DC

## 2012-11-26 NOTE — Patient Instructions (Addendum)
Your physician has requested that you have a lower extremity arterial exercise duplex. During this test, exercise and ultrasound are used to evaluate arterial blood flow in the legs. Allow one hour for this exam. There are no restrictions or special instructions.  Your physician has recommended you make the following change in your medication: started you back on clopidogrel 75 mg. This has been sent yo your pharmacy.

## 2012-11-26 NOTE — Progress Notes (Signed)
Patient ID: Kathy Marshall, female   DOB: 1935/05/12, 78 y.o.   MRN: [id number]   HPI: Kathy Marshall, is a 78 y.o. female who has established coronary artery disease, continued tobacco use, and COPD. Chief complaint today is that of her legs giving out with inability to walk for long distances and also paresthesias and burning of her feet.  Kathy Marshall has established cardiac disease and in 2006 underwent insertion of a Cypher stent to her right ring artery. Her paroxysmal SVT for which Kathy Marshall's been on diltiazem. In April 2013 an echo Doppler study showed normal ejection fraction with mild concentric LVH. Chest mild-to-moderate LA dilatation, mild mitral regurgitation and mild pulmonary hypertension with estimated RV systolic pressure at 30-40 mm. Recently, Kathy Marshall had been hospitalized in February 2014 with coffee-ground hematemesis. He was found to have a gastric ulcer by Dr. Dickie La. Apparently at that time Kathy Marshall had been using Goody's powder and Kathy Marshall also was on aspirin and Plavix. These have been held and Kathy Marshall was told that Kathy Marshall could resume Plavix in one month but Kathy Marshall has not yet done this over the past several months Kathy Marshall has noticed more leg discomfort with walking. Kathy Marshall did see Huey Bienenstock in followup of her hospitalization in March 2000 1430 scheduled her for Myoview study. This continued to show normal perfusion and was without scar or ischemia. Stress ejection fraction was 72%. Showed normal wall motion. Kathy Marshall comes to the office today for evaluation.  Past Medical History  Diagnosis Date  . Low back pain   . Bronchitis   . Chronic hoarseness   . Sleep apnea, obstructive   . Renal artery stenosis   . PVD (peripheral vascular disease)   . Hypercholesteremia   . Pain in limb   . COPD (chronic obstructive pulmonary disease)   . Chronic headache   . Benign essential hypertension   . Atherosclerosis     coronary vessel type  . Coronary artery disease   . Diverticulosis   . History of  GI diverticular bleed   . Myocardial infarct   . History of esophagitis   . H. pylori infection   . Altered mental status   . Pneumonia   . TIA (transient ischemic attack) 07/2010  . Arthritis   . GERD (gastroesophageal reflux disease)   . History of blood clots   . GI bleed   . Gastric ulcer   . Tubular adenoma 2012    Past Surgical History  Procedure Laterality Date  . Oophorectomy    . Coronary angioplasty with stent placement  2006    Cypher stent to RCA. On cath in 2008 stent described as widely patent.   . Abdominal hysterectomy    . Cholecystectomy    . Esophagogastroduodenoscopy N/A 08/21/2012    Procedure: ESOPHAGOGASTRODUODENOSCOPY (EGD);  Surgeon: Hart Carwin, MD;  Location: [facility] ENDOSCOPY;  Service: Endoscopy;  Laterality: N/A;    No Known Allergies  Current Outpatient Prescriptions  Medication Sig Dispense Refill  . albuterol (PROAIR HFA) 108 (90 BASE) MCG/ACT inhaler Inhale 2 puffs into the lungs.        . ALPRAZolam (XANAX) 0.5 MG tablet Take 0.5 mg by mouth 3 (three) times daily as needed for sleep.      Marland Kitchen atorvastatin (LIPITOR) 80 MG tablet Take 80 mg by mouth at bedtime.      . clopidogrel (PLAVIX) 75 MG tablet Take 1 tablet (75 mg total) by mouth daily.  90 tablet  3  . cyclobenzaprine (  FLEXERIL) 5 MG tablet TAKE 1 TABLET (5 MG TOTAL) BY MOUTH EVERY 8 (EIGHT) HOURS AS NEEDED.  30 tablet  0  . ferrous sulfate 325 (65 FE) MG tablet Take 325 mg by mouth daily.      Marland Kitchen gabapentin (NEURONTIN) 300 MG capsule Take 300 mg by mouth at bedtime.      . isosorbide mononitrate (IMDUR) 60 MG 24 hr tablet Take 1 tablet (60 mg total) by mouth daily.  30 tablet  6  . losartan (COZAAR) 25 MG tablet Take 25 mg by mouth daily.       . metoprolol tartrate (LOPRESSOR) 25 MG tablet Take 0.5 tablets (12.5 mg total) by mouth 2 (two) times daily.      Marland Kitchen morphine (MS CONTIN) 30 MG 12 hr tablet Take 30 mg by mouth. Every 12 hours as needed for pain      . Nutritional Supplements  (COMPLETE PROTEIN/VITAMIN SHAKE PO) Take 2-3 Bottles by mouth daily.      . ondansetron (ZOFRAN) 4 MG tablet TAKE 1 TABLET EVERY 8 HOURS AS NEEDED NAUSEA  20 tablet  1  . oxyCODONE-acetaminophen (PERCOCET) 10-325 MG per tablet Take 1 tablet by mouth 5 (five) times daily as needed for pain.      . pantoprazole (PROTONIX) 40 MG tablet TAKE 1 TABLET BY MOUTH TWICE A DAY  60 tablet  0  . topiramate (TOPAMAX) 25 MG tablet Take 4 tablets (100 mg total) by mouth daily.  120 tablet  1   No current facility-administered medications for this visit.    Socially, Kathy Marshall continues to smoke one pack of cigarettes per day. Kathy Marshall is divorced with 6 children 13 grandchildren. Kathy Marshall does not routinely exercise. There is no alcohol use.  ROS is negative for any recurrent GI bleed following her hospitalization. Kathy Marshall denies fevers chills night sweats. Kathy Marshall does have some mild shortness of breath. Kathy Marshall denies chest heaviness. Kathy Marshall does note decreased ability to walk secondary to her legs giving out. Kathy Marshall does admit to burning of her feet appear Kathy Marshall denies nausea vomiting or diarrhea. Kathy Marshall denies increased cough. Other system review is negative  PE BP 108/60  Pulse 57  Ht 5\' 2"  (1.575 m)  Wt 103 lb 12.8 oz (47.083 kg)  BMI 18.98 kg/m2  General: Alert, oriented, no distress. Kathy Marshall smells like tobacco. HEENT: Normocephalic, atraumatic. Pupils round and reactive; sclera anicteric;  Nose without nasal septal hypertrophy Mouth/Parynx benign; Mallinpatti scale 2/3 Neck: No JVD, no carotid briuts Lungs: Decreased breath sounds diffusely; no wheezing or rales Heart: RRR, s1 s2 normal 1/6 systolic murmur. No S3 gallop. Abdomen: soft, nontender; no hepatosplenomehaly, BS+; abdominal aorta nontender and not dilated by palpation. Pulses 2+ soft femoral bruits. Extremities: no clubbinbg cyanosis or edema, Homan's sign negative  Neurologic: grossly nonfocal  ECG: Sinus rhythm at 57 beats per minute without significant ST  changes.  LABS:  BMET    Component Value Date/Time   NA 139 11/21/2012 1235   K 3.6 11/21/2012 1235   CL 105 11/21/2012 1235   CO2 24 11/21/2012 1235   GLUCOSE 98 11/21/2012 1235   BUN 21 11/21/2012 1235   CREATININE 0.70 11/21/2012 1235   CALCIUM 8.6 11/21/2012 1235   GFRNONAA 83* 11/21/2012 1235   GFRAA >90 11/21/2012 1235     Hepatic Function Panel     Component Value Date/Time   PROT 6.3 11/21/2012 1235   ALBUMIN 3.0* 11/21/2012 1235   AST 16 11/21/2012 1235   ALT 9 11/21/2012  1235   ALKPHOS 74 11/21/2012 1235   BILITOT 0.1* 11/21/2012 1235     CBC    Component Value Date/Time   WBC 9.5 11/21/2012 1235   RBC 4.20 11/21/2012 1235   HGB 13.0 11/21/2012 1235   HCT 39.4 11/21/2012 1235   PLT 177 11/21/2012 1235   MCV 93.8 11/21/2012 1235   MCH 31.0 11/21/2012 1235   MCHC 33.0 11/21/2012 1235   RDW 13.6 11/21/2012 1235   LYMPHSABS 2.4 11/21/2012 1235   MONOABS 0.5 11/21/2012 1235   EOSABS 0.2 11/21/2012 1235   BASOSABS 0.0 11/21/2012 1235     BNP    Component Value Date/Time   PROBNP 38.0 03/19/2009 1740    Lipid Panel     Component Value Date/Time   CHOL  Value: 112        ATP III CLASSIFICATION:  <200     mg/dL   Desirable  161-096  mg/dL   Borderline High  >=045    mg/dL   High        10/03/8117 0220   TRIG 104 03/20/2009 0220   HDL 33* 03/20/2009 0220   CHOLHDL 3.4 03/20/2009 0220   VLDL 21 03/20/2009 0220   LDLCALC  Value: 58        Total Cholesterol/HDL:CHD Risk Coronary Heart Disease Risk Table                     Men   Women  1/2 Average Risk   3.4   3.3  Average Risk       5.0   4.4  2 X Average Risk   9.6   7.1  3 X Average Risk  23.4   11.0        Use the calculated Patient Ratio above and the CHD Risk Table to determine the patient's CHD Risk.        ATP III CLASSIFICATION (LDL):  <100     mg/dL   Optimal  147-829  mg/dL   Near or Above                    Optimal  130-159  mg/dL   Borderline  562-130  mg/dL   High  >865     mg/dL   Very High 7/84/6962 9528      RADIOLOGY: Dg Chest 2 View  11/21/2012   *RADIOLOGY REPORT*  Clinical Data: Chest pain, hypertension  CHEST - 2 VIEW  Comparison: 10/26/2010  Findings: The heart and pulmonary vascularity are within normal limits.  Mild interstitial changes are noted of a chronic nature. No focal infiltrate or sizable effusion is seen.  Coronary stents are noted.  IMPRESSION: Chronic changes without acute abnormality.   Original Report Authenticated By: Alcide Clever, M.D.   Ct Head Wo Contrast  11/21/2012   *RADIOLOGY REPORT*  Clinical Data: Confusion.  CT HEAD WITHOUT CONTRAST  Technique:  Contiguous axial images were obtained from the base of the skull through the vertex without contrast.  Comparison: 10/26/2010  Findings: Bone windows demonstrate fluid in the right mastoid air cells on image 2/series 3, new since the prior.  Remainder paranasal sinuses clear.  Soft tissue windows demonstrate no  mass lesion, hemorrhage, hydrocephalus, acute infarct, intra-axial, or extra-axial fluid collection.  Mild to moderate low density in the periventricular white matter likely related to small vessel disease.  IMPRESSION:  1. No acute intracranial abnormality. 2.  New right-sided mastoid effusion.   Original Report Authenticated By:  Jeronimo Greaves, M.D.      ASSESSMENT AND PLAN: Kathy Marshall is now 8 years status post stenting of her right coronary artery with a 3.5x28 mm Cypher stent. Her most recent nuclear study continues to show normal perfusion. I will discuss with her today concerning the importance of complete smoking cessation and discussed the adverse implications with continued ongoing smoking. I am scheduling her for Lower extremity arterial Doppler studies to make certain her symptoms of leg weakness are not due to claudication. Of note Kathy Marshall feels that the symptoms have become much worse since Kathy Marshall has been off Plavix. I have recommended Kathy Marshall resume Plavix at 75 mg daily and told her not to resume aspirin. I  recommended Kathy Marshall increase her gabapentin to 300 mg twice a day. I will contact her regarding the above studies and if abnormal I will see her back sooner. Otherwise I will see her in 6 months for cardiology reevaluation.     Lennette Bihari, MD, [facility]  11/26/2012 5:04 PM

## 2012-11-27 ENCOUNTER — Other Ambulatory Visit: Payer: Self-pay | Admitting: Family Medicine

## 2012-11-27 NOTE — Telephone Encounter (Signed)
Needs hospital follow up.  She was admitted for AMS.

## 2012-11-27 NOTE — Telephone Encounter (Signed)
Refill denied and pt was called.  Appt made for Friday.

## 2012-11-27 NOTE — Telephone Encounter (Signed)
Refill appropriate  Please approve

## 2012-11-30 ENCOUNTER — Ambulatory Visit: Payer: Medicare Other | Admitting: Family Medicine

## 2012-12-04 ENCOUNTER — Ambulatory Visit (INDEPENDENT_AMBULATORY_CARE_PROVIDER_SITE_OTHER): Payer: Medicare Other | Admitting: Family Medicine

## 2012-12-04 ENCOUNTER — Encounter: Payer: Self-pay | Admitting: Family Medicine

## 2012-12-04 VITALS — BP 128/70 | HR 60 | Temp 98.3°F | Resp 18 | Wt 101.0 lb

## 2012-12-04 DIAGNOSIS — R4182 Altered mental status, unspecified: Secondary | ICD-10-CM

## 2012-12-04 DIAGNOSIS — G43909 Migraine, unspecified, not intractable, without status migrainosus: Secondary | ICD-10-CM

## 2012-12-04 DIAGNOSIS — R51 Headache: Secondary | ICD-10-CM

## 2012-12-04 MED ORDER — ALPRAZOLAM 0.5 MG PO TABS: 0.5000 mg | ORAL_TABLET | Freq: Three times a day (TID) | ORAL | Status: DC | PRN

## 2012-12-04 NOTE — Progress Notes (Signed)
Subjective:    Patient ID: Kathy Marshall, female    DOB: [birth year], 78 y.o.   MRN: [id number]  HPI  10/16/12  The patient presents for followup of her headaches. Patient was last seen March 11. She been admitted in February with a bleeding gastric ulcer. At the time she was taking Goody powders on a daily basis for a daily headache.  She's been using the powders for years possibly as long as a decade. She states she's had headaches her entire life. The headache now is constant, pressure-like in sensation, and associated with nausea. Her last head CT scan was negative in 2012. She has a daughter and a granddaughter who both suffer with migraines. I asked her to give 1 month off of Goody powder to see if this was just a medication rebound headache.  The headache has worsened.  She is here today discuss other therapeutic options.  He also reports daily nausea and vomiting associated with the headache.  At that time, I diagnosed her with: 1. Migraines 2. Chronic daily headache Begin Topamax 25 mg by mouth each bedtime. She's to increase this to 25 mg each week until either she is taking 100 mg by mouth each bedtime or ishe encounters side effects or headaches stop.  I warned about dizziness and somnolent from medication. I warned about weight loss. We'll recheck the patient in one month. I prescribe Zofran 4 mg by mouth every 8 hours when necessary nausea .or vomiting  12/04/12 Since I last saw the patient she had good emergency room for altered mental status. She was hallucinating that people were standing outside the house. She was hallucinating that she saw objects weighing on her in the bed. At the emergency room the chest x-ray urinalysis and a CAT scan of the head trauma normal limits. The patient's mentation cleared emergency room and she was discharged home. Of note she is taking Xanax 0.5 mg 3 times a day, Percocet 4 times a day and MS Contin.  She also recently started the Topamax as  described above. Since starting the Topamax she has lost almost 9 pounds.  Past Medical History  Diagnosis Date  . Low back pain   . Bronchitis   . Chronic hoarseness   . Sleep apnea, obstructive   . Renal artery stenosis   . PVD (peripheral vascular disease)   . Hypercholesteremia   . Pain in limb   . COPD (chronic obstructive pulmonary disease)   . Chronic headache   . Benign essential hypertension   . Atherosclerosis     coronary vessel type  . Coronary artery disease   . Diverticulosis   . History of GI diverticular bleed   . Myocardial infarct   . History of esophagitis   . H. pylori infection   . Altered mental status   . Pneumonia   . TIA (transient ischemic attack) 07/2010  . Arthritis   . GERD (gastroesophageal reflux disease)   . History of blood clots   . GI bleed   . Gastric ulcer   . Tubular adenoma 2012   Current Outpatient Prescriptions on File Prior to Visit  Medication Sig Dispense Refill  . albuterol (PROAIR HFA) 108 (90 BASE) MCG/ACT inhaler Inhale 2 puffs into the lungs.        Marland Kitchen atorvastatin (LIPITOR) 80 MG tablet Take 80 mg by mouth at bedtime.      . clopidogrel (PLAVIX) 75 MG tablet Take 1 tablet (75 mg total) by mouth daily.  90 tablet  3  . cyclobenzaprine (FLEXERIL) 5 MG tablet TAKE 1 TABLET (5 MG TOTAL) BY MOUTH EVERY 8 (EIGHT) HOURS AS NEEDED.  30 tablet  0  . ferrous sulfate 325 (65 FE) MG tablet Take 325 mg by mouth daily.      Marland Kitchen gabapentin (NEURONTIN) 300 MG capsule Take 300 mg by mouth at bedtime.      . isosorbide mononitrate (IMDUR) 60 MG 24 hr tablet Take 1 tablet (60 mg total) by mouth daily.  30 tablet  6  . losartan (COZAAR) 25 MG tablet Take 25 mg by mouth daily.       . metoprolol tartrate (LOPRESSOR) 25 MG tablet Take 0.5 tablets (12.5 mg total) by mouth 2 (two) times daily.      Marland Kitchen morphine (MS CONTIN) 30 MG 12 hr tablet Take 30 mg by mouth. Every 12 hours as needed for pain      . Nutritional Supplements (COMPLETE PROTEIN/VITAMIN  SHAKE PO) Take 2-3 Bottles by mouth daily.      . ondansetron (ZOFRAN) 4 MG tablet TAKE 1 TABLET EVERY 8 HOURS AS NEEDED NAUSEA  20 tablet  1  . oxyCODONE-acetaminophen (PERCOCET) 10-325 MG per tablet Take 1 tablet by mouth 5 (five) times daily as needed for pain.      . pantoprazole (PROTONIX) 40 MG tablet TAKE 1 TABLET BY MOUTH TWICE A DAY  60 tablet  0  . topiramate (TOPAMAX) 25 MG tablet Take 4 tablets (100 mg total) by mouth daily.  120 tablet  1   No current facility-administered medications on file prior to visit.   No Known Allergies    Review of Systems  All other systems reviewed and are negative.       Objective:   Physical Exam  Constitutional: She is oriented to person, place, and time. She appears well-developed and well-nourished.  HENT:  Head: Normocephalic and atraumatic.  Cardiovascular: Normal rate and regular rhythm.   Pulmonary/Chest: Effort normal and breath sounds normal.  Neurological: She is alert and oriented to person, place, and time. She has normal reflexes. No cranial nerve deficit. She exhibits normal muscle tone.          Assessment & Plan:  1. Migraines Due to the patient's weight loss, decreased Topamax to 25 mg by mouth daily recheck weight in one month.  2. Chronic daily headache See above  3. Altered mental status I think this is likely due to polypharmacy in this 78 year old frail female with possibly underlying dementia. Had a long discussion with the patient and her family about the risk of the medication she is taking. I recommended we begin weaning her down on these medications immediately. I have asked her to decrease Xanax from Q8 hours 2 twice a day. In a month we will decrease it to just at night.  I have also asked her to decrease Topamax from 75 mg a day to 25 mg a day. This should help with the weight loss. I also asked the patient and her daughter to call Dr. Ethelene Hal.  I feel she needs to decrease the MS Contin and Percocet use.  He prescribes these medications to her for her chronic low back pain. I will defer to their joint decision about the best way to wean down gradually on these medications. Recheck in 1 month.

## 2012-12-05 ENCOUNTER — Ambulatory Visit (INDEPENDENT_AMBULATORY_CARE_PROVIDER_SITE_OTHER): Payer: Medicare Other | Admitting: Family Medicine

## 2012-12-05 ENCOUNTER — Encounter (HOSPITAL_COMMUNITY): Payer: Self-pay | Admitting: Emergency Medicine

## 2012-12-05 ENCOUNTER — Inpatient Hospital Stay (HOSPITAL_COMMUNITY)
Admission: EM | Admit: 2012-12-05 | Discharge: 2012-12-07 | DRG: 378 | Disposition: A | Discharge status: Home / Self Care | Payer: Medicare Other | Attending: Family Medicine | Admitting: Family Medicine

## 2012-12-05 VITALS — BP 186/90 | HR 67 | Temp 96.6°F | Resp 19 | Wt 101.0 lb

## 2012-12-05 DIAGNOSIS — Z9861 Coronary angioplasty status: Secondary | ICD-10-CM

## 2012-12-05 DIAGNOSIS — I2510 Atherosclerotic heart disease of native coronary artery without angina pectoris: Secondary | ICD-10-CM | POA: Diagnosis present

## 2012-12-05 DIAGNOSIS — K922 Gastrointestinal hemorrhage, unspecified: Secondary | ICD-10-CM

## 2012-12-05 DIAGNOSIS — J449 Chronic obstructive pulmonary disease, unspecified: Secondary | ICD-10-CM | POA: Diagnosis present

## 2012-12-05 DIAGNOSIS — R112 Nausea with vomiting, unspecified: Secondary | ICD-10-CM

## 2012-12-05 DIAGNOSIS — K2970 Gastritis, unspecified, without bleeding: Secondary | ICD-10-CM | POA: Diagnosis present

## 2012-12-05 DIAGNOSIS — R634 Abnormal weight loss: Secondary | ICD-10-CM | POA: Diagnosis present

## 2012-12-05 DIAGNOSIS — Z79899 Other long term (current) drug therapy: Secondary | ICD-10-CM

## 2012-12-05 DIAGNOSIS — G8929 Other chronic pain: Secondary | ICD-10-CM | POA: Diagnosis present

## 2012-12-05 DIAGNOSIS — K920 Hematemesis: Secondary | ICD-10-CM | POA: Diagnosis present

## 2012-12-05 DIAGNOSIS — I739 Peripheral vascular disease, unspecified: Secondary | ICD-10-CM | POA: Diagnosis present

## 2012-12-05 DIAGNOSIS — E785 Hyperlipidemia, unspecified: Secondary | ICD-10-CM | POA: Diagnosis present

## 2012-12-05 DIAGNOSIS — Z8673 Personal history of transient ischemic attack (TIA), and cerebral infarction without residual deficits: Secondary | ICD-10-CM

## 2012-12-05 DIAGNOSIS — E7800 Pure hypercholesterolemia, unspecified: Secondary | ICD-10-CM | POA: Diagnosis present

## 2012-12-05 DIAGNOSIS — J4489 Other specified chronic obstructive pulmonary disease: Secondary | ICD-10-CM | POA: Diagnosis present

## 2012-12-05 DIAGNOSIS — Z681 Body mass index (BMI) 19 or less, adult: Secondary | ICD-10-CM

## 2012-12-05 DIAGNOSIS — M129 Arthropathy, unspecified: Secondary | ICD-10-CM | POA: Diagnosis present

## 2012-12-05 DIAGNOSIS — D649 Anemia, unspecified: Secondary | ICD-10-CM

## 2012-12-05 DIAGNOSIS — F17200 Nicotine dependence, unspecified, uncomplicated: Secondary | ICD-10-CM | POA: Diagnosis present

## 2012-12-05 DIAGNOSIS — K2941 Chronic atrophic gastritis with bleeding: Principal | ICD-10-CM | POA: Diagnosis present

## 2012-12-05 DIAGNOSIS — I10 Essential (primary) hypertension: Secondary | ICD-10-CM | POA: Diagnosis present

## 2012-12-05 DIAGNOSIS — G4733 Obstructive sleep apnea (adult) (pediatric): Secondary | ICD-10-CM | POA: Diagnosis present

## 2012-12-05 DIAGNOSIS — E876 Hypokalemia: Secondary | ICD-10-CM | POA: Diagnosis present

## 2012-12-05 DIAGNOSIS — I252 Old myocardial infarction: Secondary | ICD-10-CM

## 2012-12-05 DIAGNOSIS — K219 Gastro-esophageal reflux disease without esophagitis: Secondary | ICD-10-CM | POA: Diagnosis present

## 2012-12-05 DIAGNOSIS — E43 Unspecified severe protein-calorie malnutrition: Secondary | ICD-10-CM | POA: Diagnosis present

## 2012-12-05 DIAGNOSIS — K2990 Gastroduodenitis, unspecified, without bleeding: Secondary | ICD-10-CM | POA: Diagnosis present

## 2012-12-05 HISTORY — DX: Headache: R51

## 2012-12-05 HISTORY — DX: Angina pectoris, unspecified: I20.9

## 2012-12-05 HISTORY — DX: Headache, unspecified: R51.9

## 2012-12-05 HISTORY — DX: Shortness of breath: R06.02

## 2012-12-05 HISTORY — DX: Cardiac murmur, unspecified: R01.1

## 2012-12-05 HISTORY — DX: Anxiety disorder, unspecified: F41.9

## 2012-12-05 HISTORY — DX: Heart failure, unspecified: I50.9

## 2012-12-05 LAB — CBC WITH DIFFERENTIAL/PLATELET
Eosinophils Absolute: 0 10*3/uL (ref 0.0–0.7)
Eosinophils Relative: 0 % (ref 0–5)
HCT: 48.1 % — ABNORMAL HIGH (ref 36.0–46.0)
Hemoglobin: 16.6 g/dL — ABNORMAL HIGH (ref 12.0–15.0)
Lymphs Abs: 1 10*3/uL (ref 0.7–4.0)
MCH: 31.4 pg (ref 26.0–34.0)
MCV: 91.1 fL (ref 78.0–100.0)
Monocytes Absolute: 0.3 10*3/uL (ref 0.1–1.0)
Monocytes Relative: 3 % (ref 3–12)
Platelets: 240 10*3/uL (ref 150–400)
RBC: 5.28 MIL/uL — ABNORMAL HIGH (ref 3.87–5.11)

## 2012-12-05 LAB — POCT I-STAT, CHEM 8
BUN: 23 mg/dL (ref 6–23)
Creatinine, Ser: 0.7 mg/dL (ref 0.50–1.10)
Glucose, Bld: 151 mg/dL — ABNORMAL HIGH (ref 70–99)
Hemoglobin: 17.3 g/dL — ABNORMAL HIGH (ref 12.0–15.0)
TCO2: 28 mmol/L (ref 0–100)

## 2012-12-05 LAB — APTT: aPTT: 29 seconds (ref 24–37)

## 2012-12-05 LAB — COMPREHENSIVE METABOLIC PANEL
BUN: 22 mg/dL (ref 6–23)
Calcium: 9.9 mg/dL (ref 8.4–10.5)
Creatinine, Ser: 0.71 mg/dL (ref 0.50–1.10)
GFR calc Af Amer: >90 mL/min (ref >90)
GFR calc non Af Amer: 82 mL/min — ABNORMAL LOW (ref >90)
Glucose, Bld: 148 mg/dL — ABNORMAL HIGH (ref 70–99)
Total Protein: 8.1 g/dL (ref 6.0–8.3)

## 2012-12-05 LAB — TYPE AND SCREEN: ABO/RH(D): O POS

## 2012-12-05 LAB — OCCULT BLOOD, POC DEVICE: Fecal Occult Bld: NEGATIVE

## 2012-12-05 MED ORDER — METOPROLOL TARTRATE 12.5 MG HALF TABLET
12.5000 mg | ORAL_TABLET | Freq: Two times a day (BID) | ORAL | Status: DC
  Administered 2012-12-06 – 2012-12-07 (×3): 12.5 mg via ORAL
  Filled 2012-12-05 (×4): qty 1

## 2012-12-05 MED ORDER — METOPROLOL TARTRATE 1 MG/ML IV SOLN
5.0000 mg | Freq: Four times a day (QID) | INTRAVENOUS | Status: DC
  Administered 2012-12-06 – 2012-12-07 (×8): 5 mg via INTRAVENOUS
  Filled 2012-12-05 (×10): qty 5

## 2012-12-05 MED ORDER — ALPRAZOLAM 0.5 MG PO TABS
0.5000 mg | ORAL_TABLET | Freq: Three times a day (TID) | ORAL | Status: DC | PRN
  Administered 2012-12-05 – 2012-12-06 (×2): 0.5 mg via ORAL
  Filled 2012-12-05 (×2): qty 1

## 2012-12-05 MED ORDER — ATORVASTATIN CALCIUM 80 MG PO TABS
80.0000 mg | ORAL_TABLET | Freq: Every day | ORAL | Status: DC
  Administered 2012-12-05 – 2012-12-06 (×2): 80 mg via ORAL
  Filled 2012-12-05 (×3): qty 1

## 2012-12-05 MED ORDER — GABAPENTIN 300 MG PO CAPS
300.0000 mg | ORAL_CAPSULE | Freq: Every day | ORAL | Status: DC
  Administered 2012-12-05 – 2012-12-06 (×2): 300 mg via ORAL
  Filled 2012-12-05 (×3): qty 1

## 2012-12-05 MED ORDER — NICOTINE 14 MG/24HR TD PT24
14.0000 mg | MEDICATED_PATCH | Freq: Every day | TRANSDERMAL | Status: DC
  Administered 2012-12-05 – 2012-12-06 (×2): 14 mg via TRANSDERMAL
  Filled 2012-12-05 (×3): qty 1

## 2012-12-05 MED ORDER — SODIUM CHLORIDE 0.9 % IJ SOLN
3.0000 mL | Freq: Two times a day (BID) | INTRAMUSCULAR | Status: DC
  Administered 2012-12-05: 3 mL via INTRAVENOUS

## 2012-12-05 MED ORDER — FAMOTIDINE IN NACL 20-0.9 MG/50ML-% IV SOLN
20.0000 mg | Freq: Two times a day (BID) | INTRAVENOUS | Status: DC
  Administered 2012-12-05 – 2012-12-06 (×2): 20 mg via INTRAVENOUS
  Filled 2012-12-05 (×3): qty 50

## 2012-12-05 MED ORDER — PANTOPRAZOLE SODIUM 40 MG IV SOLR
40.0000 mg | INTRAVENOUS | Status: AC
  Administered 2012-12-05: 40 mg via INTRAVENOUS
  Filled 2012-12-05: qty 40

## 2012-12-05 MED ORDER — ISOSORBIDE MONONITRATE ER 60 MG PO TB24
60.0000 mg | ORAL_TABLET | Freq: Every day | ORAL | Status: DC
  Administered 2012-12-06 – 2012-12-07 (×2): 60 mg via ORAL
  Filled 2012-12-05 (×2): qty 1

## 2012-12-05 MED ORDER — OXYCODONE HCL 5 MG PO TABS: 5.0000 mg | ORAL_TABLET | Freq: Four times a day (QID) | ORAL | Status: DC | PRN

## 2012-12-05 MED ORDER — MORPHINE SULFATE ER 15 MG PO TBCR
30.0000 mg | EXTENDED_RELEASE_TABLET | Freq: Two times a day (BID) | ORAL | Status: DC | PRN
  Administered 2012-12-05 – 2012-12-06 (×2): 30 mg via ORAL
  Filled 2012-12-05: qty 2
  Filled 2012-12-05 (×2): qty 1

## 2012-12-05 MED ORDER — ALBUTEROL SULFATE HFA 108 (90 BASE) MCG/ACT IN AERS: 2.0000 | INHALATION_SPRAY | RESPIRATORY_TRACT | Status: DC | PRN

## 2012-12-05 MED ORDER — TOPIRAMATE 25 MG PO TABS
25.0000 mg | ORAL_TABLET | Freq: Every day | ORAL | Status: DC
  Administered 2012-12-06 – 2012-12-07 (×2): 25 mg via ORAL
  Filled 2012-12-05 (×2): qty 1

## 2012-12-05 MED ORDER — SODIUM CHLORIDE 0.9 % IV SOLN
INTRAVENOUS | Status: DC
  Administered 2012-12-05 – 2012-12-06 (×2): via INTRAVENOUS

## 2012-12-05 MED ORDER — OXYCODONE-ACETAMINOPHEN 5-325 MG PO TABS
1.0000 | ORAL_TABLET | Freq: Four times a day (QID) | ORAL | Status: DC | PRN
  Administered 2012-12-06 – 2012-12-07 (×2): 1 via ORAL
  Filled 2012-12-05 (×3): qty 1

## 2012-12-05 MED ORDER — FERROUS SULFATE 325 (65 FE) MG PO TABS
325.0000 mg | ORAL_TABLET | Freq: Every day | ORAL | Status: DC
  Administered 2012-12-06: 325 mg via ORAL
  Filled 2012-12-05 (×2): qty 1

## 2012-12-05 MED ORDER — METOPROLOL TARTRATE 1 MG/ML IV SOLN
2.5000 mg | Freq: Once | INTRAVENOUS | Status: AC
  Administered 2012-12-05: 2.5 mg via INTRAVENOUS
  Filled 2012-12-05: qty 5

## 2012-12-05 MED ORDER — LOSARTAN POTASSIUM 25 MG PO TABS
25.0000 mg | ORAL_TABLET | Freq: Every day | ORAL | Status: DC
  Administered 2012-12-06: 25 mg via ORAL
  Filled 2012-12-05: qty 1

## 2012-12-05 MED ORDER — HYDRALAZINE HCL 20 MG/ML IJ SOLN: 5.0000 mg | INTRAMUSCULAR | Status: DC | PRN

## 2012-12-05 MED ORDER — PANTOPRAZOLE SODIUM 40 MG IV SOLR
40.0000 mg | Freq: Two times a day (BID) | INTRAVENOUS | Status: DC
  Administered 2012-12-05 – 2012-12-07 (×4): 40 mg via INTRAVENOUS
  Filled 2012-12-05 (×5): qty 40

## 2012-12-05 MED ORDER — FAMOTIDINE IN NACL 20-0.9 MG/50ML-% IV SOLN
20.0000 mg | INTRAVENOUS | Status: AC
  Administered 2012-12-05: 20 mg via INTRAVENOUS
  Filled 2012-12-05: qty 50

## 2012-12-05 MED ORDER — ONDANSETRON HCL 4 MG/2ML IJ SOLN
4.0000 mg | Freq: Four times a day (QID) | INTRAMUSCULAR | Status: DC | PRN
  Administered 2012-12-06 (×3): 4 mg via INTRAVENOUS
  Filled 2012-12-05 (×3): qty 2

## 2012-12-05 MED ORDER — FAMOTIDINE 200 MG/20ML IV SOLN
20.0000 mg | Freq: Two times a day (BID) | INTRAVENOUS | Status: DC
  Filled 2012-12-05: qty 2

## 2012-12-05 MED ORDER — OXYCODONE-ACETAMINOPHEN 10-325 MG PO TABS: 1.0000 | ORAL_TABLET | Freq: Four times a day (QID) | ORAL | Status: DC | PRN

## 2012-12-05 MED ORDER — ONDANSETRON HCL 4 MG PO TABS: 4.0000 mg | ORAL_TABLET | Freq: Four times a day (QID) | ORAL | Status: DC | PRN

## 2012-12-05 MED ORDER — MORPHINE SULFATE 30 MG PO TB12: 30.0000 mg | ORAL_TABLET | Freq: Two times a day (BID) | ORAL | Status: DC | PRN

## 2012-12-05 MED ORDER — SODIUM CHLORIDE 0.9 % IV BOLUS (SEPSIS)
1000.0000 mL | Freq: Once | INTRAVENOUS | Status: AC
  Administered 2012-12-05: 1000 mL via INTRAVENOUS

## 2012-12-05 NOTE — Assessment & Plan Note (Signed)
Elevated BP no meds taken

## 2012-12-05 NOTE — Patient Instructions (Addendum)
Go Directly to ER - Hb 16.7 today

## 2012-12-05 NOTE — Assessment & Plan Note (Signed)
Coffee ground emesis, GI bleed per above, witness in office

## 2012-12-05 NOTE — Progress Notes (Signed)
  Subjective:    Patient ID: Kathy Marshall, female    DOB: Aug 20, 1934, 78 y.o.   MRN: [id number]  HPI  Patient presents with coffee-ground emesis for the past 24 hours. She was seen yesterday secondary to chronic migraine and AMS thought to be multifactorial due to polypharmacy and high dose narcotics. Her Topamax was also decreased due to weight loss. She admits to abdominal soreness denies any bright red blood per stool. She has a history of bleeding ulcer in the past which had to be cleaned and she also had to be transfused multiple times. She restarted her Plavix about 2 weeks ago by her cardiologist. Kathy Marshall been very fatigued and has been unable to eat. She tried Zofran which was prescribed yesterday however continues to vomit with the use of this. She denies any chest pain or shortness of breath.  Here with daughter Kathy Marshall   Review of Systems   GEN- + fatigue, fever, weight loss,weakness, recent illness HEENT- denies eye drainage, change in vision, nasal discharge, CVS- denies chest pain, palpitations RESP- denies SOB, cough, wheeze ABD- + N/V, change in stools, +abd pain MSK- denies joint pain, injury GU- denies dysuria, hematuria, dribbling, incontinence Neuro- + headache, dizziness, syncope, seizure activity      Objective:   Physical Exam GEN- NAD, alert and oriented x3, very fatigued and weak, lying on bed in emesis HEENT- PERRL, EOMI, non injected sclera, pink conjunctiva, dry mucous membranes, oropharynx clear Neck- Supple, CVS- RRR, no murmur RESP-decreased at bases, normal WOB, no wheeze, no rales ABD- Decreased BS upper quadrants,  Mild TTP diffusely, no rebound, no guarding- coffee ground emesis noted in room Rectum- FOBT positive - dark stool  EXT- No edema  Pulses- Radial 2+        Assessment & Plan:

## 2012-12-05 NOTE — Assessment & Plan Note (Addendum)
Coffee ground emesis for the past 24 hours. She also is Hemoccult positive she is on Plavix as well as iron tablets which may cause changes in the Hemoccult results. Her stat hemoglobin the office was 16.7 To be routed to [facility] for evaluation Records reviewed, history of diverticular bleed, history gastric ulcer in Feb with bleeding Discussed with triage at [facility], EMS to route patient

## 2012-12-05 NOTE — ED Notes (Signed)
Pt sent to ED by [facility] for further evaluation of n/v/d with blood. VS stable. Pt was started back on Plavix x2 weeks ago. NAD noted.

## 2012-12-05 NOTE — ED Provider Notes (Signed)
History     CSN: [id number]  Arrival date & time 12/05/12  1406   First MD Initiated Contact with Patient 12/05/12 1407      Chief Complaint  Patient presents with  . GI Bleeding    (Consider location/radiation/quality/duration/timing/severity/associated sxs/prior treatment) HPI Comments: 78 year old female with a history of peptic ulcer disease, history of a pyloric ulcer that was bleeding prior epinephrine injections as well as clipping to stop the bleeding.  She presents after having her Plavix restarted by her cardiologist stating that she has had nausea and vomiting since last night and multiple episodes of coffee grounds emesis this morning. At this time she is nauseated, has mild epigastric discomfort, denies fevers chills coughing shortness of breath chest pain or syncope. The symptoms are intermittent, frequent, nothing seems to make them better or worse.  Cardiology, [facility]  Gastroenterology, Dr. [name]  The history is provided by the patient and the EMS personnel.    Past Medical History  Diagnosis Date  . Low back pain   . Bronchitis   . Chronic hoarseness   . Sleep apnea, obstructive   . Renal artery stenosis   . PVD (peripheral vascular disease)   . Hypercholesteremia   . Pain in limb   . COPD (chronic obstructive pulmonary disease)   . Chronic headache   . Benign essential hypertension   . Atherosclerosis     coronary vessel type  . Coronary artery disease   . Diverticulosis   . History of GI diverticular bleed   . Myocardial infarct   . History of esophagitis   . H. pylori infection   . Altered mental status   . Pneumonia   . TIA (transient ischemic attack) 07/2010  . Arthritis   . GERD (gastroesophageal reflux disease)   . History of blood clots   . GI bleed   . Gastric ulcer   . Tubular adenoma 2012    Past Surgical History  Procedure Laterality Date  . Oophorectomy    . Coronary angioplasty with stent placement   2006    Cypher stent to RCA. On cath in 2008 stent described as widely patent.   . Abdominal hysterectomy    . Cholecystectomy    . Esophagogastroduodenoscopy N/A 08/21/2012    Procedure: ESOPHAGOGASTRODUODENOSCOPY (EGD);  Surgeon: Hart Carwin, MD;  Location: [facility] ENDOSCOPY;  Service: Endoscopy;  Laterality: N/A;    Family History  Problem Relation Age of Onset  . Heart disease Mother   . Stroke Mother   . Stomach cancer Mother   . Heart disease Father   . Stroke Father   . Heart disease Sister   . Colon cancer Neg Hx     History  Substance Use Topics  . Smoking status: Current Every Day Smoker -- 1.00 packs/day for 56 years    Types: Cigarettes  . Smokeless tobacco: Never Used  . Alcohol Use: No     Comment: occasional    OB History   Grav Para Term Preterm Abortions TAB SAB Ect Mult Living                  Review of Systems  All other systems reviewed and are negative.    Allergies  Review of patient's allergies indicates no known allergies.  Home Medications   Current Outpatient Rx  Name  Route  Sig  Dispense  Refill  . albuterol (PROAIR HFA) 108 (90 BASE) MCG/ACT inhaler   Inhalation   Inhale  2 puffs into the lungs.           . ALPRAZolam (XANAX) 0.5 MG tablet   Oral   Take 1 tablet (0.5 mg total) by mouth 3 (three) times daily as needed for sleep.   60 tablet   0   . atorvastatin (LIPITOR) 80 MG tablet   Oral   Take 80 mg by mouth at bedtime.         . clopidogrel (PLAVIX) 75 MG tablet   Oral   Take 1 tablet (75 mg total) by mouth daily.   90 tablet   3   . cyclobenzaprine (FLEXERIL) 5 MG tablet   Oral   Take 5 mg by mouth 3 (three) times daily as needed for muscle spasms.         . ferrous sulfate 325 (65 FE) MG tablet   Oral   Take 325 mg by mouth daily.         Marland Kitchen gabapentin (NEURONTIN) 300 MG capsule   Oral   Take 300 mg by mouth at bedtime.         . isosorbide mononitrate (IMDUR) 60 MG 24 hr tablet   Oral   Take 1  tablet (60 mg total) by mouth daily.   30 tablet   6   . losartan (COZAAR) 25 MG tablet   Oral   Take 25 mg by mouth daily.          . metoprolol tartrate (LOPRESSOR) 25 MG tablet   Oral   Take 0.5 tablets (12.5 mg total) by mouth 2 (two) times daily.         Marland Kitchen morphine (MS CONTIN) 30 MG 12 hr tablet   Oral   Take 30 mg by mouth. Every 12 hours as needed for pain         . Nutritional Supplements (COMPLETE PROTEIN/VITAMIN SHAKE PO)   Oral   Take 2-3 Bottles by mouth daily.         . ondansetron (ZOFRAN) 4 MG tablet   Oral   Take 4 mg by mouth every 8 (eight) hours as needed for nausea.         Marland Kitchen oxyCODONE-acetaminophen (PERCOCET) 10-325 MG per tablet   Oral   Take 1 tablet by mouth 5 (five) times daily as needed for pain.         . pantoprazole (PROTONIX) 40 MG tablet      TAKE 1 TABLET BY MOUTH TWICE A DAY   60 tablet   0   . topiramate (TOPAMAX) 25 MG tablet   Oral   Take 25 mg by mouth daily.           BP 201/100  Pulse 83  Temp(Src) 99.3 F (37.4 C) (Oral)  Resp 22  SpO2 96%  Physical Exam  Nursing note and vitals reviewed. Constitutional: She appears well-developed and well-nourished. No distress.  HENT:  Head: Normocephalic and atraumatic.  Mouth/Throat: Oropharynx is clear and moist. No oropharyngeal exudate.  Eyes: Conjunctivae and EOM are normal. Pupils are equal, round, and reactive to light. Right eye exhibits no discharge. Left eye exhibits no discharge. No scleral icterus.  Neck: Normal range of motion. Neck supple. No JVD present. No thyromegaly present.  Cardiovascular: Normal rate, regular rhythm, normal heart sounds and intact distal pulses.  Exam reveals no gallop and no friction rub.   No murmur heard. Pulmonary/Chest: Effort normal and breath sounds normal. No respiratory distress. She has no wheezes. She has  no rales.  Abdominal: Soft. Bowel sounds are normal. She exhibits no distension and no mass. There is no tenderness.   Musculoskeletal: Normal range of motion. She exhibits no edema and no tenderness.  Lymphadenopathy:    She has no cervical adenopathy.  Neurological: She is alert. Coordination normal.  Skin: Skin is warm and dry. No rash noted. No erythema.  Psychiatric: She has a normal mood and affect. Her behavior is normal.    ED Course  Procedures (including critical care time)  Labs Reviewed  CBC WITH DIFFERENTIAL - Abnormal; Notable for the following:    WBC 11.9 (*)    RBC 5.28 (*)    Hemoglobin 16.6 (*)    HCT 48.1 (*)    Neutrophils Relative % 89 (*)    Neutro Abs 10.6 (*)    Lymphocytes Relative 8 (*)    All other components within normal limits  COMPREHENSIVE METABOLIC PANEL - Abnormal; Notable for the following:    Glucose, Bld 148 (*)    GFR calc non Af Amer 82 (*)    All other components within normal limits  POCT I-STAT, CHEM 8 - Abnormal; Notable for the following:    Glucose, Bld 151 (*)    Hemoglobin 17.3 (*)    HCT 51.0 (*)    All other components within normal limits  APTT  PROTIME-INR  TYPE AND SCREEN   No results found.   1. Upper GI bleed   2. Hypertension       MDM  At this time the patient is clear mucous membranes, she is hypertensive, she is not febrile nor is she tachycardic and there is not appear to be any signs of emesis in her mouth. Her abdomen is nontender, she is likely having recurrent upper GI bleeding and will require evaluation, anticipate admission to the hospital, labs ordered, will consult gastroenterology.  The patient has not had any further hematemesis since arrival to she is persistently nauseated with epigastric discomfort. Lab work shows that she has a slight hyperglycemia, hemoglobin is elevated at 17, white blood cell count is 11,900 and electrolytes are normal, renal function is normal. Her INR is 0.9. At this time I think that the patient likely does have recurrent upper GI bleeding and will need gastroenterology. I have not yet  heard back from them but the family practice resident will come to admit the patient.       Vida Roller, MD 12/05/12 [phone]

## 2012-12-05 NOTE — ED Notes (Signed)
Pt undressed, in gown, on monitor, continuous pulse oximetry and blood pressure cuff; family at bedside; warm blanket given

## 2012-12-05 NOTE — H&P (Signed)
Family Medicine Teaching [facility] Admission History and Physical Service Pager: [phone]  Patient name: Kathy Marshall Medical record number: [id number] Date of birth: 04/28/1935 Age: 78 y.o. Gender: female  Primary Care Provider: Leo Grosser, MD  Chief Complaint: hematemesis  Assessment and Plan: ASHELEY Marshall is a 78 y.o. year old female with CAD s/p stent, COPD, and stomach ulcer with clipping 4 months ago who presents with hematemesis in setting of recently starting Plavix.  # Hematemesis: likely from known stomach ulcer, precipitated by starting plavix recently. Hgb and vital signs stable, so no indication for transfusion at this time. - Admit to telemetry - IV protonix and famotidine BID - IV zofran prn nausea (avoid phenergan due to sedation in elderly patient) - check FOBT [ ]  Consult GI  [ ]  Check orthostatic vital signs [ ]  Recheck CBC in AM (sooner if any vital sign instability)  # HTN: BP's markedly elevated in ER, most likely because patient did not take her AM medications - metoprolol 2.5mg  IV x1 now - restart home BP meds in the AM - IV hydralazine prn SBP >180, DBP > 110  # CAD: no complaints of chest pain or shortness of breath - check EKG given GI bleed, but will not check troponins given stable Hgb and lack of cardiac symptoms - hold plavix in setting of acute GI bleed - continue home imdur  # PAD - has outpatient lower extremity arterial doppler scheduled, will encourage pt to keep this appointment  # HLD: - continue home statin  # COPD:  - continue home albuterol prn  # Chronic benzo use: - will continue home xanax prn in hospital to avoid withdrawal  # Chronic pain: - continue home MS contin, percocet, gabapentin - continue home topamax for headache  # OSA: [ ]  will need to discuss with pt whether she uses a CPAP at home, if so we can offer it here in the hospital  # FEN - clear liquid diet for now, NPO after  midnight except sips with meds - NS @ 125cc/hr - maintain two PIV's due to GI bleed  # Prophylaxis: - SCD's, will not do pharmacologic prophylaxis due to active GI bleed. Of note, PMH in EMR states hx of blood clots, but patient says she does not remember ever having had a blood clot before.  # Dispo:  - pending clinical improvement, GI consult, stabilization of HTN  # Code Status: discussed with patient, she desires to be full code   History of Present Illness: Kathy Marshall is a 78 y.o. year old female presenting with hematemesis.  Pt has a hx of a stomach ulcer which underwent clipping and epinephrine injection on 08/21/2012 by Dr. Juanda Chance of [facility]. She recently presented to her cardiologist (Dr. Tresa Endo of [facility]) complaining of leg pain, and out of concern for claudication was put on plavix. Pt went to PCP's office yesterday to follow up on chronic migraines, and a few hours after that appointment her stomach began cramping. As the night went on it kept cramping, and overnight she started vomiting. This morning the vomit turned into dark black color. She presented to PCP's office [facility]) and was sent to the ER at [facility] for further evaluation.  Denies having any chest pain, shortness of breath. No diarrhea. She is having a cramping feeling in her stomach but no pain. No pain else where in her body. Last PO intake was last night.  Has had a headache today  in the back of her head to her shoulder. From chart review, she has chronic headaches. No weakness/numbness in body, no confusion or speech problems. Does not feel weak in general. Has not seen any blood in her stool. Last vomit was just a little while ago here in the ER.  Review Of Systems: Per HPI. Otherwise 12 point review of systems was performed and was unremarkable.  Patient Active Problem List   Diagnosis Date Noted  . Acute gastric ulcer with bleeding 08/21/2012  . GI bleed 08/20/2012  . Nausea and vomiting  08/20/2012  . CAD (coronary artery disease), tandem Cyper DES to prox. and Mid RCA 2006, stable at cath 208 08/20/2012  . MYOCARDIAL INFARCTION, HX OF 08/19/2010  . DIVERTICULOSIS OF COLON 08/19/2010  . WEIGHT LOSS 08/19/2010  . HYPERCHOLESTEROLEMIA 02/19/2007  . HYPERTENSION, BENIGN ESSENTIAL 02/19/2007  . COPD 02/19/2007  . GERD 02/19/2007  . LOW BACK PAIN, CHRONIC 02/19/2007  . PAIN IN LIMB 02/19/2007  . HEADACHE, CHRONIC 02/19/2007  . HOARSENESS, CHRONIC 02/19/2007  . DIVERTICULAR BLEEDING, HX OF 08/25/2005  . OBSTRUCTIVE SLEEP APNEA 09/06/2002  . BRONCHITIS, SEVERE 08/26/2002  . RENAL ARTERY STENOSIS 11/26/1999  . PERIPHERAL VASCULAR DISEASE 11/26/1999  . ATHEROSCLEROSIS, CORONARY, VESSEL TYPE NOS 06/27/1997    Past Medical History: Past Medical History  Diagnosis Date  . Low back pain   . Bronchitis   . Chronic hoarseness   . Sleep apnea, obstructive   . Renal artery stenosis   . PVD (peripheral vascular disease)   . Hypercholesteremia   . Pain in limb   . COPD (chronic obstructive pulmonary disease)   . Chronic headache   . Benign essential hypertension   . Atherosclerosis     coronary vessel type  . Coronary artery disease   . Diverticulosis   . History of GI diverticular bleed   . Myocardial infarct   . History of esophagitis   . H. pylori infection   . Altered mental status   . Pneumonia   . TIA (transient ischemic attack) 07/2010  . Arthritis   . GERD (gastroesophageal reflux disease)   . History of blood clots   . GI bleed   . Gastric ulcer   . Tubular adenoma 2012   Has had mini strokes but no stroke Doesn't think she's had a blood clot before  Past Surgical History: Past Surgical History  Procedure Laterality Date  . Oophorectomy    . Coronary angioplasty with stent placement  2006    Cypher stent to RCA. On cath in 2008 stent described as widely patent.   . Abdominal hysterectomy    . Cholecystectomy    . Esophagogastroduodenoscopy N/A  08/21/2012    Procedure: ESOPHAGOGASTRODUODENOSCOPY (EGD);  Surgeon: Hart Carwin, MD;  Location: [facility] ENDOSCOPY;  Service: Endoscopy;  Laterality: N/A;    Home Medications:  No current facility-administered medications on file prior to encounter.   Current Outpatient Prescriptions on File Prior to Encounter  Medication Sig Dispense Refill  . albuterol (PROAIR HFA) 108 (90 BASE) MCG/ACT inhaler Inhale 2 puffs into the lungs.        . ALPRAZolam (XANAX) 0.5 MG tablet Take 1 tablet (0.5 mg total) by mouth 3 (three) times daily as needed for sleep.  60 tablet  0  . atorvastatin (LIPITOR) 80 MG tablet Take 80 mg by mouth at bedtime.      . clopidogrel (PLAVIX) 75 MG tablet Take 1 tablet (75 mg total) by mouth daily.  90 tablet  3  . ferrous sulfate 325 (65 FE) MG tablet Take 325 mg by mouth daily.      Marland Kitchen gabapentin (NEURONTIN) 300 MG capsule Take 300 mg by mouth at bedtime.      . isosorbide mononitrate (IMDUR) 60 MG 24 hr tablet Take 1 tablet (60 mg total) by mouth daily.  30 tablet  6  . losartan (COZAAR) 25 MG tablet Take 25 mg by mouth daily.       . metoprolol tartrate (LOPRESSOR) 25 MG tablet Take 0.5 tablets (12.5 mg total) by mouth 2 (two) times daily.      Marland Kitchen morphine (MS CONTIN) 30 MG 12 hr tablet Take 30 mg by mouth. Every 12 hours as needed for pain      . Nutritional Supplements (COMPLETE PROTEIN/VITAMIN SHAKE PO) Take 2-3 Bottles by mouth daily.      Marland Kitchen oxyCODONE-acetaminophen (PERCOCET) 10-325 MG per tablet Take 1 tablet by mouth 5 (five) times daily as needed for pain.      . pantoprazole (PROTONIX) 40 MG tablet TAKE 1 TABLET BY MOUTH TWICE A DAY  60 tablet  0  . topiramate (TOPAMAX) 25 MG tablet Take 25 mg by mouth daily.       Did not take meds yet today  Social History: History  Substance Use Topics  . Smoking status: Current Every Day Smoker -- 1.00 packs/day for 56 years    Types: Cigarettes  . Smokeless tobacco: Never Used  . Alcohol Use: No     Comment: occasional    Smokes 1ppd x many years No drug use No alcohol use  For any additional social history documentation, please refer to relevant sections of EMR.  Family History: Family History  Problem Relation Age of Onset  . Heart disease Mother   . Stroke Mother   . Stomach cancer Mother   . Heart disease Father   . Stroke Father   . Heart disease Sister   . Colon cancer Neg Hx    Allergies: No Known Allergies  Physical Exam: BP 201/100  Pulse 83  Temp(Src) 99.3 F (37.4 C) (Oral)  Resp 22  SpO2 96% Exam: General: NAD, lying in bed, appears comfortable. Elderly and frail appearing HEENT: tongue is dry. Head NCAT Cardiovascular: RRR, no m/r/g Respiratory: CTAB, NWOB Abdomen: +BS. Abdomen is soft, nontender to palpation in all quadrants. Extremities: no appreciable edema in bilateral lower extremities. Feet feel cold, DP pulses hard to palpate Skin: no rashes noted Neuro: EOMI. Tongue protrudes midline. Smile symmetric. Sensation intact to light touch on bilateral face, arms, and legs. Grip strength normal bilaterally. Speech intact. Oriented to person, place, month, and president but cannot name the year.  Labs and Imaging:  CBC:    Component Value Date/Time   WBC 11.9* 12/05/2012 1425   HGB 17.3* 12/05/2012 1448   HCT 51.0* 12/05/2012 1448   PLT 240 12/05/2012 1425   MCV 91.1 12/05/2012 1425   NEUTROABS 10.6* 12/05/2012 1425   LYMPHSABS 1.0 12/05/2012 1425   MONOABS 0.3 12/05/2012 1425   EOSABS 0.0 12/05/2012 1425   BASOSABS 0.0 12/05/2012 1425    Comprehensive Metabolic Panel:    Component Value Date/Time   NA 144 12/05/2012 1448   K 4.0 12/05/2012 1448   CL 109 12/05/2012 1448   CO2 23 12/05/2012 1425   BUN 23 12/05/2012 1448   CREATININE 0.70 12/05/2012 1448   GLUCOSE 151* 12/05/2012 1448   CALCIUM 9.9 12/05/2012 1425   AST 22 12/05/2012 1425  ALT 11 12/05/2012 1425   ALKPHOS 106 12/05/2012 1425   BILITOT 0.4 12/05/2012 1425   PROT 8.1 12/05/2012 1425   ALBUMIN 4.0 12/05/2012  1425   INR 0.93  Levert Feinstein, MD Family Medicine PGY-1 Service Pager [phone]    R2 Addendum:  I have seen the above patient and have discussed the patient's presentation, history, objective data, physical exam and assessment and plan with the Dr. Pollie Meyer.  Briefly, this patient is a 78 y.o. year old female who presents with 1 day history of coffee ground emesis which is recurrent for her.  She had a witnessed episode of coffee ground emesis while at her primary care doctor's office and was sent to come in for direct admission.    I have reviewed Dr. Valorie Roosevelt note in full and agree as above.  Andrena Mews, DO [facility] Resident - PGY-2 12/06/2012 6:06 AM

## 2012-12-06 ENCOUNTER — Encounter (HOSPITAL_COMMUNITY): Payer: Self-pay | Admitting: General Practice

## 2012-12-06 DIAGNOSIS — K922 Gastrointestinal hemorrhage, unspecified: Secondary | ICD-10-CM

## 2012-12-06 DIAGNOSIS — E43 Unspecified severe protein-calorie malnutrition: Secondary | ICD-10-CM | POA: Diagnosis present

## 2012-12-06 DIAGNOSIS — K920 Hematemesis: Secondary | ICD-10-CM

## 2012-12-06 LAB — CBC
HCT: 43.6 % (ref 36.0–46.0)
MCH: 31.7 pg (ref 26.0–34.0)
MCV: 90.7 fL (ref 78.0–100.0)
MCV: 90.8 fL (ref 78.0–100.0)
Platelets: 209 10*3/uL (ref 150–400)
RDW: 13.8 % (ref 11.5–15.5)
RDW: 13.8 % (ref 11.5–15.5)
WBC: 7.4 10*3/uL (ref 4.0–10.5)
WBC: 9.9 10*3/uL (ref 4.0–10.5)

## 2012-12-06 LAB — BASIC METABOLIC PANEL
Chloride: 109 mEq/L (ref 96–112)
Creatinine, Ser: 0.55 mg/dL (ref 0.50–1.10)
GFR calc Af Amer: >90 mL/min (ref >90)
Potassium: 3 mEq/L — ABNORMAL LOW (ref 3.5–5.1)
Sodium: 143 mEq/L (ref 135–145)

## 2012-12-06 MED ORDER — BOOST / RESOURCE BREEZE PO LIQD
1.0000 | Freq: Three times a day (TID) | ORAL | Status: DC
  Administered 2012-12-06 (×2): 1 via ORAL

## 2012-12-06 MED ORDER — SODIUM CHLORIDE 0.9 % IV SOLN
INTRAVENOUS | Status: DC
  Administered 2012-12-07: 08:00:00 via INTRAVENOUS
  Administered 2012-12-07: 500 mL via INTRAVENOUS

## 2012-12-06 MED ORDER — SODIUM CHLORIDE 0.45 % IV SOLN
INTRAVENOUS | Status: DC
  Administered 2012-12-06 (×2): via INTRAVENOUS
  Filled 2012-12-06 (×5): qty 1000

## 2012-12-06 MED ORDER — POTASSIUM CHLORIDE CRYS ER 20 MEQ PO TBCR
40.0000 meq | EXTENDED_RELEASE_TABLET | Freq: Two times a day (BID) | ORAL | Status: AC
  Administered 2012-12-06: 40 meq via ORAL
  Filled 2012-12-06: qty 2
  Filled 2012-12-06: qty 1

## 2012-12-06 MED ORDER — LOSARTAN POTASSIUM 50 MG PO TABS
50.0000 mg | ORAL_TABLET | Freq: Every day | ORAL | Status: DC
  Administered 2012-12-07: 50 mg via ORAL
  Filled 2012-12-06: qty 1

## 2012-12-06 NOTE — Progress Notes (Signed)
INITIAL NUTRITION ASSESSMENT  DOCUMENTATION CODES Per approved criteria  -Severe malnutrition in the context of acute illness or injury   INTERVENTION: 1. Resource Breeze po TID, each supplement provides 250 kcal and 9 grams of protein.   NUTRITION DIAGNOSIS: Inadequate oral intake related to N/V as evidenced by weight loss.   Goal: Tolerance of clear liquid diet and diet advance  Monitor:  PO intake, weight trends, labs   Reason for Assessment: Malnutrition Screening tool  78 y.o. female  Admitting Dx: Coffee Ground Emesis  ASSESSMENT: Pt admitted with 1 day hx of coffee ground emesis. Pt with recent upper GI bleed and gastric ulcers clipped 4 months ago per notes.   Pt reports weight loss for "a while" not able to quantify time frame, less then one year. Appetite is decreased, only taking bites of meals. Does drink 2 Ensure supplements daily.  Weight hx shows 9 lbs (8% body weight) loss in the past 2 months.   Pt meets criteria for severe malnutrition in the context of acute illness 2/2 8% body weight loss in 2 months, and meeting </=50% estimated nutrition needs for >/= 5 days.   Height: Ht Readings from Last 1 Encounters:  12/05/12 5\' 2"  (1.575 m)    Weight: Wt Readings from Last 1 Encounters:  12/05/12 101 lb 12.8 oz (46.176 kg)    Ideal Body Weight: 110 lbs  % Ideal Body Weight: 93%  Wt Readings from Last 10 Encounters:  12/05/12 101 lb 12.8 oz (46.176 kg)  12/05/12 101 lb (45.813 kg)  12/04/12 101 lb (45.813 kg)  11/26/12 103 lb 12.8 oz (47.083 kg)  11/21/12 109 lb (49.442 kg)  10/18/12 110 lb (49.896 kg)  10/16/12 106 lb (48.081 kg)  09/04/12 106 lb (48.081 kg)  08/22/12 110 lb 3.7 oz (50 kg)  08/22/12 110 lb 3.7 oz (50 kg)    Usual Body Weight: 110 lbs  % Usual Body Weight: 92%  BMI:  Body mass index is 18.61 kg/(m^2). Underweight   Estimated Nutritional Needs: Kcal: 1200-1400 Protein: 45-55 gm  Fluid: >/= 1.5 L   Skin: intact   Diet  Order: NPO  EDUCATION NEEDS: -No education needs identified at this time  No intake or output data in the 24 hours ending 12/06/12 0928  Last BM: PTA    Labs:   Recent Labs Lab 12/05/12 1425 12/05/12 1448 12/06/12 0440  NA 141 144 143  K 4.3 4.0 3.0*  CL 104 109 109  CO2 23  --  21  BUN 22 23 21   CREATININE 0.71 0.70 0.55  CALCIUM 9.9  --  9.1  GLUCOSE 148* 151* 123*    CBG (last 3)  No results found for this basename: GLUCAP,  in the last 72 hours  Scheduled Meds: . atorvastatin  80 mg Oral QHS  . famotidine (PEPCID) IV  20 mg Intravenous Q12H  . ferrous sulfate  325 mg Oral Q breakfast  . gabapentin  300 mg Oral QHS  . isosorbide mononitrate  60 mg Oral Daily  . losartan  25 mg Oral Daily  . metoprolol  5 mg Intravenous Q6H  . metoprolol tartrate  12.5 mg Oral BID  . nicotine  14 mg Transdermal Daily  . pantoprazole (PROTONIX) IV  40 mg Intravenous Q12H  . potassium chloride  40 mEq Oral BID  . sodium chloride  3 mL Intravenous Q12H  . topiramate  25 mg Oral Daily    Continuous Infusions: . sodium chloride 0.45 %  with kcl      Past Medical History  Diagnosis Date  . Low back pain   . Bronchitis   . Chronic hoarseness   . Sleep apnea, obstructive   . Renal artery stenosis   . PVD (peripheral vascular disease)   . Hypercholesteremia   . Pain in limb   . COPD (chronic obstructive pulmonary disease)   . Benign essential hypertension   . Atherosclerosis     coronary vessel type  . Coronary artery disease   . Diverticulosis   . History of GI diverticular bleed   . History of esophagitis   . H. pylori infection   . Altered mental status   . Pneumonia   . TIA (transient ischemic attack) 07/2010  . Arthritis   . GERD (gastroesophageal reflux disease)   . History of blood clots   . GI bleed   . Tubular adenoma 2012  . Heart murmur   . CHF (congestive heart failure)   . Anginal pain   . Myocardial infarct 1990's    "I've only had one" (12/06/2012)   . Exertional shortness of breath   . Daily headache   . Anxiety   . Gastric ulcer 07/2012    large; required clipping Hattie Perch 08/23/2012 (12/06/2012)    Past Surgical History  Procedure Laterality Date  . Oophorectomy    . Coronary angioplasty with stent placement  2006    Cypher stent to RCA. On cath in 2008 stent described as widely patent.   . Esophagogastroduodenoscopy N/A 08/21/2012    Procedure: ESOPHAGOGASTRODUODENOSCOPY (EGD);  Surgeon: Hart Carwin, MD;  Location: [facility] ENDOSCOPY;  Service: Endoscopy;  Laterality: N/A;  . Vaginal hysterectomy      Clarene Duke RD, LDN Pager [phone] After Hours pager [phone]

## 2012-12-06 NOTE — Progress Notes (Signed)
[facility] Consult: 8:44 AM 12/06/2012   Referring Provider: Dr Jeanice Lim of Family Medicine  Primary Care Physician:  Leo Grosser, MD Primary Gastroenterologist:  [name] MD    Reason for Consultation:  GI bleed  HPI: Kathy Marshall is a 78 y.o. female.  Smoker with hx CAD, cardiac cypher stent 2006, COPD, OSA.  On EGDs in 2007 and 2012 had gastritis, duodenitis. Duodenal stricture in 2012. Prepuloric ulcer in 07/2012.   Seen Feb 2014 for weight loss, anorexia, minor hemetemesis in setting of once daily Omeprazole 20 mg and thrice daily Goodies, Plavix.  EGD showed solitary prepyloric ulcer oozing blood, this was hemoclipped and injected with Epi.  Treated with PPI drip, home on once daily Protonix (BID was Rxd but insurance only covered once daily). Did not require blood transfusion.  She cancelled GI ROV for May but did return to lab for 11/05/12 lab work at which point Hgb was 13.8, up from 11.0 on 2/27.   Had not restarted Plavix until after 11/26/12 ROV with Dr Tresa Endo, her cardiologist.  She was c/o leg weakness when walking longer distances.  She is for LE doppler study 6/16.  Started on Topamax for chronic headaches March 2014.  9 # weight loss since then. Doses of Topamax and Xanax decreased 6/10 due to visual hallucinations prompting ED visit 5/28 (head CT negative) and weight loss.   Doing well from GI perspective at [facility] 6/10:  Daily BMs, no nausea, stable PO intake (never has a great appetite and intake generally subpar).  Has been compliant with BID Protonix.  Smoking 1PPD. That afternoon/evining started having pain in mid abdomen, moderate severity, not relieved by laying down and resting.  Still present Wed AM when she had single episode of ? CG emesis.  Seen at PMD that AM and sent to ED and admitted.  The abdominal pain resolved yest afternoon.  Has not vomited again until this AM, slight rusty tinge to otherwise clear  emesis observed by me.  This occurred after swallowing 40n Meq of oral Potassium At ED arrival Hgb of 16.6, now 14.6, sixteen hours later. BUN is 21 (reached 35 during 07/2012 admission).  Coags normal.  No abdominal pain at present.  Last BM was yesterday in AM: formed and darker than usual but not melenic or bloody.       Past Medical History  Diagnosis Date  . Low back pain   . Bronchitis   . Chronic hoarseness   . Sleep apnea, obstructive   . Renal artery stenosis   . PVD (peripheral vascular disease)   . Hypercholesteremia   . Pain in limb   . COPD (chronic obstructive pulmonary disease)   . Benign essential hypertension   . Atherosclerosis     coronary vessel type  . Coronary artery disease   . Diverticulosis   . History of GI diverticular bleed   . History of esophagitis   . H. pylori infection   . Altered mental status   . Pneumonia   . TIA (transient ischemic attack) 07/2010  . Arthritis   . GERD (gastroesophageal reflux disease)   . History of blood clots   . GI bleed   . Tubular adenoma 2012  . Heart murmur   . CHF (congestive heart failure)   . Anginal pain   . Myocardial infarct 1990's    "I've only had one" (12/06/2012)  . Exertional shortness of breath   . Daily headache   . Anxiety   .  Gastric ulcer 07/2012    large; required clipping Hattie Perch 08/23/2012 (12/06/2012)    Past Surgical History  Procedure Laterality Date  . Oophorectomy    . Coronary angioplasty with stent placement  2006    Cypher stent to RCA. On cath in 2008 stent described as widely patent.   . Esophagogastroduodenoscopy N/A 08/21/2012    Procedure: ESOPHAGOGASTRODUODENOSCOPY (EGD);  Surgeon: Hart Carwin, MD;  Location[facility] ENDOSCOPY;  Service: Endoscopy;  Laterality: N/A;  . Vaginal hysterectomy      Prior to Admission medications   Medication Sig Start Date End Date Taking? Authorizing Provider  albuterol (PROAIR HFA) 108 (90 BASE) MCG/ACT inhaler Inhale 2 puffs into the lungs.      Yes Historical Provider, MD  ALPRAZolam Prudy Feeler) 0.5 MG tablet Take 1 tablet (0.5 mg total) by mouth 3 (three) times daily as needed for sleep. 12/04/12  Yes Donita Brooks, MD  atorvastatin (LIPITOR) 80 MG tablet Take 80 mg by mouth at bedtime.   Yes Historical Provider, MD  clopidogrel (PLAVIX) 75 MG tablet Take 1 tablet (75 mg total) by mouth daily. 11/26/12  Yes Lennette Bihari, MD  cyclobenzaprine (FLEXERIL) 5 MG tablet Take 5 mg by mouth 3 (three) times daily as needed for muscle spasms.   Yes Historical Provider, MD  ferrous sulfate 325 (65 FE) MG tablet Take 325 mg by mouth daily.   Yes Historical Provider, MD  gabapentin (NEURONTIN) 300 MG capsule Take 300 mg by mouth at bedtime.   Yes Historical Provider, MD  isosorbide mononitrate (IMDUR) 60 MG 24 hr tablet Take 1 tablet (60 mg total) by mouth daily. 11/07/12  Yes Lennette Bihari, MD  losartan (COZAAR) 25 MG tablet Take 25 mg by mouth daily.  09/25/12  Yes Historical Provider, MD  metoprolol tartrate (LOPRESSOR) 25 MG tablet Take 0.5 tablets (12.5 mg total) by mouth 2 (two) times daily. 08/23/12  Yes Calvert Cantor, MD  morphine (MS CONTIN) 30 MG 12 hr tablet Take 30 mg by mouth. Every 12 hours as needed for pain   Yes Historical Provider, MD  Nutritional Supplements (COMPLETE PROTEIN/VITAMIN SHAKE PO) Take 2-3 Bottles by mouth daily.   Yes Historical Provider, MD  ondansetron (ZOFRAN) 4 MG tablet Take 4 mg by mouth every 8 (eight) hours as needed for nausea.   Yes Historical Provider, MD  oxyCODONE-acetaminophen (PERCOCET) 10-325 MG per tablet Take 1 tablet by mouth 5 (five) times daily as needed for pain.   Yes Historical Provider, MD  pantoprazole (PROTONIX) 40 MG tablet TAKE 1 TABLET BY MOUTH TWICE A DAY 11/10/12  Yes Hart Carwin, MD  topiramate (TOPAMAX) 25 MG tablet Take 25 mg by mouth daily. 10/16/12  Yes Donita Brooks, MD    Scheduled Meds: . atorvastatin  80 mg Oral QHS  . famotidine (PEPCID) IV  20 mg Intravenous Q12H  . ferrous  sulfate  325 mg Oral Q breakfast  . gabapentin  300 mg Oral QHS  . isosorbide mononitrate  60 mg Oral Daily  . losartan  25 mg Oral Daily  . metoprolol  5 mg Intravenous Q6H  . metoprolol tartrate  12.5 mg Oral BID  . nicotine  14 mg Transdermal Daily  . pantoprazole (PROTONIX) IV  40 mg Intravenous Q12H  . potassium chloride  40 mEq Oral BID  . sodium chloride  3 mL Intravenous Q12H  . topiramate  25 mg Oral Daily   Infusions: . sodium chloride 125 mL/hr at 12/06/12 [phone]  PRN Meds: albuterol, ALPRAZolam, hydrALAZINE, morphine, ondansetron (ZOFRAN) IV, ondansetron, oxyCODONE, oxyCODONE-acetaminophen   Allergies as of 12/05/2012  . (No Known Allergies)    Family History  Problem Relation Age of Onset  . Heart disease Mother   . Stroke Mother   . Stomach cancer Mother   . Heart disease Father   . Stroke Father   . Heart disease Sister   . Colon cancer Neg Hx   . Lung cancer Sister     History   Social History  . Marital Status: Divorced    Spouse Name: N/A    Number of Children: 6  . Years of Education: N/A   Occupational History  . RETIRED    Social History Main Topics  . Smoking status: Current Every Day Smoker -- 1.00 packs/day for 60 years    Types: Cigarettes  . Smokeless tobacco: Never Used  . Alcohol Use: No     Comment: occasional  . Drug Use: No  . Sexually Active: No   Other Topics Concern  . Not on file   Social History Narrative   4-6 cups of coffee daily     REVIEW OF SYSTEMS: Constitutional:  No new weakness ENT:  No nose bleeds or congestion Pulm:  No SOB, no cough CV:  No chest pain.  Occasional irregular beats.  No extremity swelling GU:  No dysuria, no hematuria, no frequency GI:  Occasional, sporadic solid and pill dysphagia Heme:  On po Iron at home, compliant.    Transfusions:  None  Neuro:  Chronic headaches:  Migraine and chronic daily headaches. No gait or balance disturbances.  No presyncope or dizziness Psych:  Seen 5/28  in ED for visual hallucinations.  Head CT negative.  ? If due to narcotics, Xanax and Topamax combo.  Dose of Xanax and Topamax decreased 6/10.  Dr Tanya Nones also wished to decrease doses of narcotics but wanted to coordinate this with Dr Ethelene Hal, ortho MD MS:  Chronic low back pain.  Ortho Rx her MS contin and percocet.  Derm:  No rash, itching or sores.   Endocrine:  No sweats, chills.  No  Immunization:  Up to date flu, pneumovax.  Travel:  None   PHYSICAL EXAM: Vital signs in last 24 hours: Temp:  [96.6 F (35.9 C)-100.2 F (37.9 C)] 100.2 F (37.9 C) (06/12 0544) Pulse Rate:  [67-94] 82 (06/12 0544) Resp:  [17-26] 18 (06/12 0544) BP: (158-201)/(60-110) 164/84 mmHg (06/12 0544) SpO2:  [93 %-96 %] 94 % (06/12 0544) Weight:  [45.813 kg (101 lb)-46.176 kg (101 lb 12.8 oz)] 46.176 kg (101 lb 12.8 oz) (06/11 2041)  General: frail, cachectic, osteoporotic looking WF who looks chronically unwell.  Head:  No asymmetry or swelling  Eyes:  No icterus, no pallor Ears:  Not HOH  Nose:  No drainage Mouth:  Somewhat dry oral MM.  No oral ulcers.  Fair dentition Neck:  No mass or JVD Lungs:  Clear bil Heart: RRR with occasional PVCs.  No MRG Abdomen:  Soft, active BS, no distention, no mass or HSM.  Marland Kitchen   Rectal: deferred   Musc/Skeltl: spinal kyphosis Extremities:  No CCE  Neurologic:  Not confused, moves all 4s.  No tremor.  No limb weakness. Skin:  No rash or sores.  No telaqngectasia Tattoos:  none Nodes:  No inguinal adenopathy   Psych:  Pleasant, affect flat.  Not agitated.    LAB RESULTS:   12/05/12 1425 12/05/12 1448 12/06/12 0440  WBC 11.9*  --  7.4  HGB 16.6* 17.3* 14.6  HCT 48.1* 51.0* 42.7  PLT 240  --  209   BMET Lab Results  Component Value Date   NA 143 12/06/2012   NA 144 12/05/2012   NA 141 12/05/2012   K 3.0* 12/06/2012   K 4.0 12/05/2012   K 4.3 12/05/2012   CL 109 12/06/2012   CL 109 12/05/2012   CL 104 12/05/2012   CO2 21 12/06/2012   CO2 23 12/05/2012   CO2  24 11/21/2012   GLUCOSE 123* 12/06/2012   GLUCOSE 151* 12/05/2012   GLUCOSE 148* 12/05/2012   BUN 21 12/06/2012   BUN 23 12/05/2012   BUN 22 12/05/2012   CREATININE 0.55 12/06/2012   CREATININE 0.70 12/05/2012   CREATININE 0.71 12/05/2012   CALCIUM 9.1 12/06/2012   CALCIUM 9.9 12/05/2012   CALCIUM 8.6 11/21/2012   LFT   12/05/12 1425  PROT 8.1  ALBUMIN 4.0  AST 22  ALT 11  ALKPHOS 106  BILITOT 0.4   PT/INR Lab Results  Component Value Date   INR 0.93 12/05/2012    ENDOSCOPIC STUDIES: 08/21/2012  EGD ENDOSCOPIC IMPRESSION:  Single ulcer ranging between 3-15mm in size was found in the  prepyloric region of the stomach; Hemostasis was attempted by  placing two hemoclips on the bleeding site and Epinephrine  injection of 4 cc in 3 separate sites achieving complete  hemostasis  RECOMMENDATIONS:  1. Watch for further bleeding- H/H, Vital signs  2. PPI infusion  3. await biopsies Pathology Stomach, biopsy - BENIGN OXYNTIC TYPE GASTRIC MUCOSA WITH MINIMAL CHRONIC INACTIVE INFLAMMATION. - WARTHIN-STARRY STAIN IS NEGATIVE FOR HELICOBACTER PYLORI. - NO INTESTINAL METAPLASIA, DYSPLASIA OR MALIGNANCY IDENTIFIED.  02/2011 EGD  Irregular Z-line. Severe gastritis. Mild fibrotic stricture at duod bulb and descending duodenum, scope passed easily, mild duodenitis.   02/2011 Colonoscopy  Sessile polyp at 10 cm removed.  Pathology 2012  1. Surgical [P], duodenum, bx  - REACTIVE ANTRAL AND SMALL BOWEL TYPE MUCOSA.  - NO DYSPLASIA OR MALIGNANCY IDENTIFIED.  - SEE COMMENT.  2. Surgical [P], stomach, bx  - GASTRIC ANTRAL MUCOSA WITH INTESTINAL EPITHELIUM.  - WARTHIN-STARRY STAIN IS NEGATIVE FOR HELICOBACTER PYLORI.  - NO DYSPLASIA OR MALIGNANCY IDENTIFIED.  - SEE COMMENT.  3. Surgical [P], GE junction, bx  - SQUAMOCOLUMNAR JUNCTION MUCOSA SHOWING CHRONIC INFLAMMATION  AND CHANGES CONSISTENT WITH REFLUX ESOPHAGITIS.  - NO INTESTINAL METAPLASIA, DYSPLASIA OR MALIGNANCY IDENTIFIED.  4.  Surgical [P], colon at 10 cm, polyp  - TUBULAR ADENOMA (X1).  - NO HIGH GRADE DYSPLASIA OR MALIGNANCY IDENTIFIED.   08/2005 EGD  Esophagitis, gastritis.   08/2005 Colonoscopy  Left sided diverticulosis, with residual blood in sigmoid. suboptimal prep  Pathology 2007  1. SMALL BOWEL, BIOPSIES: BENIGN SMALL BOWEL MUCOSA. NO VILLOUS ATROPHY, ACTIVE INFLAMMATION OR GRANULOMAS. 2. GASTRIC BIOPSIES: CHRONIC ACTIVE GASTRITIS WITH HELICOBACTER PYLORI. NO DYSPLASIA OR CARCINOMA IDENTIFIED. 3. ESOPHAGUS, BIOPSIES: INFLAMED SQUAMOUS MUCOSA. NO INTESTINAL METAPLASIA, DYSPLASIA OR CARCINOMA IDENTIFIED. 4. COLON, RANDOM BIOPSIES: BENIGN COLONIC MUCOSA. NO ACTIVE INFLAMMATION, MICROSCOPIC COLITIS, GRANULOMAS OR CHRONIC CHANGES.   IMPRESSION: *  CG emesis, recurrent.  Same occurred in Feb 2014 and had solitary, oozing prepyloric ulcer on EGD.  Using Goodie's, 81 ASA, Plavix, Omeprazole 20 mg at the time.  Plavix restarted 6/2.  Compliant with BID Protonix and not using ASA/NSAIDs *  Mid abdominal pain, resolved.  Rule out UTI.  Pt is at risk for mesenteric vascular disease:  CT in 2012 showed thoracic aortic vascular  disease.  However does not have precedent of po associated abd pain or vomiting.  *  Cypher coronary stent 2006. Plavix restart 6/2.  *  LE weakness.  Doppler LE set for 6/16 to assess for ? claudication.  *  Narcotics dependent musculoskeletal pain and headaches. Not using any ASA products.  *  Hyperglycemia. Max 151.  No previous dx of DM.  *  Hx adenomatous colon polyp 2012  PLAN: *  EGD 1300 tomorrow (ERCP at 11 impeded scheduling at that time). Clears today.  Stop IV Pepcid.  Continue IV BID Protonix. *  Ordered UA to rule out UTI as source of the sudden N/V and abdominal pain.    LOS: 1 day   Jennye Moccasin  12/06/2012, 8:44 AM Pager: [phone]

## 2012-12-06 NOTE — H&P (Signed)
[facility] Attending Admission Note: Kathy Marshall Pager [phone] I  have seen and examined this patient, reviewed their chart. I have discussed this patient with the resident. I agree with the resident's findings, assessment and care plan.  Briefly,this is 78 Y/O Female with Pmx of Upper GI bleed,Gastric ulcers,CAD,COPD,HTN,was sent to [facility] from her PCP office for 1 day hx of coffee ground emesis,patient was recently started on Plavix by her cardiologist otherwise no other medication change. Two months ago she had upper endoscopy for similar presentation for which she got hemoclips and epinephrine injection at her GI ulcer site,endoscopy showed 3-24mm single deep pyloric ulcer. Upon admission patient denies any further vomiting,no abdominal pain of which she had cramping prior to this admission,denies change in bowel movement or blood in her stool.  O/E:  Gen: Frail elderly woman,lying comfortably in bed,not in distress. Neuro: Alert and oriented. CV: S1 S2 normal,no murmurs. Resp: Air entry equal B/L. No crackles. Gastro: Soft abdomen,normal BS,no tenderness or distension. Ext: No edema.  A/P: 78 Y/O Female with 1. Upper GI bleed likely triggered by Plavic.     CBC check was normal. COntinue to monitor H/H.     Clear liquids till midnight then NPO for possible GI procedure,patient stated she is thirsty and will like to drink.     GI consult.     Start PPI.     IV Hydration.  2. HTN/CAD: BP quite elevated on admission.     Off home medication.     May convert to IV antihypertensive medication while NPO.     IV Hydralazine prn.     IV metoprolol recommended  3. COPD : continue home med.

## 2012-12-06 NOTE — Consult Note (Signed)
[facility] Consult: 8:44 AM 12/06/2012   Referring Provider: Dr Jeanice Lim of Family Medicine  Primary Care Physician:  Leo Grosser, MD Primary Gastroenterologist:  [name] MD    Reason for Consultation:  GI bleed  HPI: Kathy Marshall is a 78 y.o. female.  Smoker with hx CAD, cardiac cypher stent 2006, COPD, OSA.  On EGDs in 2007 and 2012 had gastritis, duodenitis. Duodenal stricture in 2012. Prepuloric ulcer in 07/2012.   Seen Feb 2014 for weight loss, anorexia, minor hemetemesis in setting of once daily Omeprazole 20 mg and thrice daily Goodies, Plavix.  EGD showed solitary prepyloric ulcer oozing blood, this was hemoclipped and injected with Epi.  Treated with PPI drip, home on once daily Protonix (BID was Rxd but insurance only covered once daily). Did not require blood transfusion.  She cancelled GI ROV for May but did return to lab for 11/05/12 lab work at which point Hgb was 13.8, up from 11.0 on 2/27.   Had not restarted Plavix until after 11/26/12 ROV with Dr Tresa Endo, her cardiologist.  She was c/o leg weakness when walking longer distances.  She is for LE doppler study 6/16.  Started on Topamax for chronic headaches March 2014.  9 # weight loss since then. Doses of Topamax and Xanax decreased 6/10 due to visual hallucinations prompting ED visit 5/28 (head CT negative) and weight loss.   Doing well from GI perspective at [facility] 6/10:  Daily BMs, no nausea, stable PO intake (never has a great appetite and intake generally subpar).  Has been compliant with BID Protonix.  Smoking 1PPD. That afternoon/evining started having pain in mid abdomen, moderate severity, not relieved by laying down and resting.  Still present Wed AM when she had single episode of ? CG emesis.  Seen at PMD that AM and sent to ED and admitted.  The abdominal pain resolved yest afternoon.  Has not vomited again until this AM, slight rusty tinge to otherwise clear emesis  observed by me.  This occurred after swallowing 40n Meq of oral Potassium At ED arrival Hgb of 16.6, now 14.6, sixteen hours later. BUN is 21 (reached 35 during 07/2012 admission).  Coags normal.  No abdominal pain at present.  Last BM was yesterday in AM: formed and darker than usual but not melenic or bloody.       Past Medical History  Diagnosis Date  . Low back pain   . Bronchitis   . Chronic hoarseness   . Sleep apnea, obstructive   . Renal artery stenosis   . PVD (peripheral vascular disease)   . Hypercholesteremia   . Pain in limb   . COPD (chronic obstructive pulmonary disease)   . Benign essential hypertension   . Atherosclerosis     coronary vessel type  . Coronary artery disease   . Diverticulosis   . History of GI diverticular bleed   . History of esophagitis   . H. pylori infection   . Altered mental status   . Pneumonia   . TIA (transient ischemic attack) 07/2010  . Arthritis   . GERD (gastroesophageal reflux disease)   . History of blood clots   . GI bleed   . Tubular adenoma 2012  . Heart murmur   . CHF (congestive heart failure)   . Anginal pain   . Myocardial infarct 1990's    "I've only had one" (12/06/2012)  . Exertional shortness of breath   . Daily headache   . Anxiety   .  Gastric ulcer 07/2012    large; required clipping Hattie Perch 08/23/2012 (12/06/2012)    Past Surgical History  Procedure Laterality Date  . Oophorectomy    . Coronary angioplasty with stent placement  2006    Cypher stent to RCA. On cath in 2008 stent described as widely patent.   . Esophagogastroduodenoscopy N/A 08/21/2012    Procedure: ESOPHAGOGASTRODUODENOSCOPY (EGD);  Surgeon: Hart Carwin, MD;  Location: [facility] ENDOSCOPY;  Service: Endoscopy;  Laterality: N/A;  . Vaginal hysterectomy      Prior to Admission medications   Medication Sig Start Date End Date Taking? Authorizing Provider  albuterol (PROAIR HFA) 108 (90 BASE) MCG/ACT inhaler Inhale 2 puffs into the lungs.     Yes  Historical Provider, MD  ALPRAZolam Prudy Feeler) 0.5 MG tablet Take 1 tablet (0.5 mg total) by mouth 3 (three) times daily as needed for sleep. 12/04/12  Yes Donita Brooks, MD  atorvastatin (LIPITOR) 80 MG tablet Take 80 mg by mouth at bedtime.   Yes Historical Provider, MD  clopidogrel (PLAVIX) 75 MG tablet Take 1 tablet (75 mg total) by mouth daily. 11/26/12  Yes Lennette Bihari, MD  cyclobenzaprine (FLEXERIL) 5 MG tablet Take 5 mg by mouth 3 (three) times daily as needed for muscle spasms.   Yes Historical Provider, MD  ferrous sulfate 325 (65 FE) MG tablet Take 325 mg by mouth daily.   Yes Historical Provider, MD  gabapentin (NEURONTIN) 300 MG capsule Take 300 mg by mouth at bedtime.   Yes Historical Provider, MD  isosorbide mononitrate (IMDUR) 60 MG 24 hr tablet Take 1 tablet (60 mg total) by mouth daily. 11/07/12  Yes Lennette Bihari, MD  losartan (COZAAR) 25 MG tablet Take 25 mg by mouth daily.  09/25/12  Yes Historical Provider, MD  metoprolol tartrate (LOPRESSOR) 25 MG tablet Take 0.5 tablets (12.5 mg total) by mouth 2 (two) times daily. 08/23/12  Yes Calvert Cantor, MD  morphine (MS CONTIN) 30 MG 12 hr tablet Take 30 mg by mouth. Every 12 hours as needed for pain   Yes Historical Provider, MD  Nutritional Supplements (COMPLETE PROTEIN/VITAMIN SHAKE PO) Take 2-3 Bottles by mouth daily.   Yes Historical Provider, MD  ondansetron (ZOFRAN) 4 MG tablet Take 4 mg by mouth every 8 (eight) hours as needed for nausea.   Yes Historical Provider, MD  oxyCODONE-acetaminophen (PERCOCET) 10-325 MG per tablet Take 1 tablet by mouth 5 (five) times daily as needed for pain.   Yes Historical Provider, MD  pantoprazole (PROTONIX) 40 MG tablet TAKE 1 TABLET BY MOUTH TWICE A DAY 11/10/12  Yes Hart Carwin, MD  topiramate (TOPAMAX) 25 MG tablet Take 25 mg by mouth daily. 10/16/12  Yes Donita Brooks, MD    Scheduled Meds: . atorvastatin  80 mg Oral QHS  . famotidine (PEPCID) IV  20 mg Intravenous Q12H  . ferrous  sulfate  325 mg Oral Q breakfast  . gabapentin  300 mg Oral QHS  . isosorbide mononitrate  60 mg Oral Daily  . losartan  25 mg Oral Daily  . metoprolol  5 mg Intravenous Q6H  . metoprolol tartrate  12.5 mg Oral BID  . nicotine  14 mg Transdermal Daily  . pantoprazole (PROTONIX) IV  40 mg Intravenous Q12H  . potassium chloride  40 mEq Oral BID  . sodium chloride  3 mL Intravenous Q12H  . topiramate  25 mg Oral Daily   Infusions: . sodium chloride 125 mL/hr at 12/06/12 [phone]  PRN Meds: albuterol, ALPRAZolam, hydrALAZINE, morphine, ondansetron (ZOFRAN) IV, ondansetron, oxyCODONE, oxyCODONE-acetaminophen   Allergies as of 12/05/2012  . (No Known Allergies)    Family History  Problem Relation Age of Onset  . Heart disease Mother   . Stroke Mother   . Stomach cancer Mother   . Heart disease Father   . Stroke Father   . Heart disease Sister   . Colon cancer Neg Hx   . Lung cancer Sister     History   Social History  . Marital Status: Divorced    Spouse Name: N/A    Number of Children: 6  . Years of Education: N/A   Occupational History  . RETIRED    Social History Main Topics  . Smoking status: Current Every Day Smoker -- 1.00 packs/day for 60 years    Types: Cigarettes  . Smokeless tobacco: Never Used  . Alcohol Use: No     Comment: occasional  . Drug Use: No  . Sexually Active: No   Other Topics Concern  . Not on file   Social History Narrative   4-6 cups of coffee daily     REVIEW OF SYSTEMS: Constitutional:  No new weakness ENT:  No nose bleeds or congestion Pulm:  No SOB, no cough CV:  No chest pain.  Occasional irregular beats.  No extremity swelling GU:  No dysuria, no hematuria, no frequency GI:  Occasional, sporadic solid and pill dysphagia Heme:  On po Iron at home, compliant.    Transfusions:  None  Neuro:  Chronic headaches:  Migraine and chronic daily headaches. No gait or balance disturbances.  No presyncope or dizziness Psych:  Seen 5/28  in ED for visual hallucinations.  Head CT negative.  ? If due to narcotics, Xanax and Topamax combo.  Dose of Xanax and Topamax decreased 6/10.  Dr Tanya Nones also wished to decrease doses of narcotics but wanted to coordinate this with Dr Ethelene Hal, ortho MD MS:  Chronic low back pain.  Ortho Rx her MS contin and percocet.  Derm:  No rash, itching or sores.   Endocrine:  No sweats, chills.  No  Immunization:  Up to date flu, pneumovax.  Travel:  None   PHYSICAL EXAM: Vital signs in last 24 hours: Temp:  [96.6 F (35.9 C)-100.2 F (37.9 C)] 100.2 F (37.9 C) (06/12 0544) Pulse Rate:  [67-94] 82 (06/12 0544) Resp:  [17-26] 18 (06/12 0544) BP: (158-201)/(60-110) 164/84 mmHg (06/12 0544) SpO2:  [93 %-96 %] 94 % (06/12 0544) Weight:  [45.813 kg (101 lb)-46.176 kg (101 lb 12.8 oz)] 46.176 kg (101 lb 12.8 oz) (06/11 2041)  General: frail, cachectic, osteoporotic looking WF who looks chronically unwell.  Head:  No asymmetry or swelling  Eyes:  No icterus, no pallor Ears:  Not HOH  Nose:  No drainage Mouth:  Somewhat dry oral MM.  No oral ulcers.  Fair dentition Neck:  No mass or JVD Lungs:  Clear bil Heart: RRR with occasional PVCs.  No MRG Abdomen:  Soft, active BS, no distention, no mass or HSM.  Marland Kitchen   Rectal: deferred   Musc/Skeltl: spinal kyphosis Extremities:  No CCE  Neurologic:  Not confused, moves all 4s.  No tremor.  No limb weakness. Skin:  No rash or sores.  No telaqngectasia Tattoos:  none Nodes:  No inguinal adenopathy   Psych:  Pleasant, affect flat.  Not agitated.    LAB RESULTS:   12/05/12 1425 12/05/12 1448 12/06/12 0440  WBC 11.9*  --  7.4  HGB 16.6* 17.3* 14.6  HCT 48.1* 51.0* 42.7  PLT 240  --  209   BMET Lab Results  Component Value Date   NA 143 12/06/2012   NA 144 12/05/2012   NA 141 12/05/2012   K 3.0* 12/06/2012   K 4.0 12/05/2012   K 4.3 12/05/2012   CL 109 12/06/2012   CL 109 12/05/2012   CL 104 12/05/2012   CO2 21 12/06/2012   CO2 23 12/05/2012   CO2  24 11/21/2012   GLUCOSE 123* 12/06/2012   GLUCOSE 151* 12/05/2012   GLUCOSE 148* 12/05/2012   BUN 21 12/06/2012   BUN 23 12/05/2012   BUN 22 12/05/2012   CREATININE 0.55 12/06/2012   CREATININE 0.70 12/05/2012   CREATININE 0.71 12/05/2012   CALCIUM 9.1 12/06/2012   CALCIUM 9.9 12/05/2012   CALCIUM 8.6 11/21/2012   LFT   12/05/12 1425  PROT 8.1  ALBUMIN 4.0  AST 22  ALT 11  ALKPHOS 106  BILITOT 0.4   PT/INR Lab Results  Component Value Date   INR 0.93 12/05/2012    ENDOSCOPIC STUDIES: 08/21/2012  EGD ENDOSCOPIC IMPRESSION:  Single ulcer ranging between 3-77mm in size was found in the  prepyloric region of the stomach; Hemostasis was attempted by  placing two hemoclips on the bleeding site and Epinephrine  injection of 4 cc in 3 separate sites achieving complete  hemostasis  RECOMMENDATIONS:  1. Watch for further bleeding- H/H, Vital signs  2. PPI infusion  3. await biopsies Pathology Stomach, biopsy - BENIGN OXYNTIC TYPE GASTRIC MUCOSA WITH MINIMAL CHRONIC INACTIVE INFLAMMATION. - WARTHIN-STARRY STAIN IS NEGATIVE FOR HELICOBACTER PYLORI. - NO INTESTINAL METAPLASIA, DYSPLASIA OR MALIGNANCY IDENTIFIED.  02/2011 EGD  Irregular Z-line. Severe gastritis. Mild fibrotic stricture at duod bulb and descending duodenum, scope passed easily, mild duodenitis.   02/2011 Colonoscopy  Sessile polyp at 10 cm removed.  Pathology 2012  1. Surgical [P], duodenum, bx  - REACTIVE ANTRAL AND SMALL BOWEL TYPE MUCOSA.  - NO DYSPLASIA OR MALIGNANCY IDENTIFIED.  - SEE COMMENT.  2. Surgical [P], stomach, bx  - GASTRIC ANTRAL MUCOSA WITH INTESTINAL EPITHELIUM.  - WARTHIN-STARRY STAIN IS NEGATIVE FOR HELICOBACTER PYLORI.  - NO DYSPLASIA OR MALIGNANCY IDENTIFIED.  - SEE COMMENT.  3. Surgical [P], GE junction, bx  - SQUAMOCOLUMNAR JUNCTION MUCOSA SHOWING CHRONIC INFLAMMATION  AND CHANGES CONSISTENT WITH REFLUX ESOPHAGITIS.  - NO INTESTINAL METAPLASIA, DYSPLASIA OR MALIGNANCY IDENTIFIED.  4.  Surgical [P], colon at 10 cm, polyp  - TUBULAR ADENOMA (X1).  - NO HIGH GRADE DYSPLASIA OR MALIGNANCY IDENTIFIED.   08/2005 EGD  Esophagitis, gastritis.   08/2005 Colonoscopy  Left sided diverticulosis, with residual blood in sigmoid. suboptimal prep  Pathology 2007  1. SMALL BOWEL, BIOPSIES: BENIGN SMALL BOWEL MUCOSA. NO VILLOUS ATROPHY, ACTIVE INFLAMMATION OR GRANULOMAS. 2. GASTRIC BIOPSIES: CHRONIC ACTIVE GASTRITIS WITH HELICOBACTER PYLORI. NO DYSPLASIA OR CARCINOMA IDENTIFIED. 3. ESOPHAGUS, BIOPSIES: INFLAMED SQUAMOUS MUCOSA. NO INTESTINAL METAPLASIA, DYSPLASIA OR CARCINOMA IDENTIFIED. 4. COLON, RANDOM BIOPSIES: BENIGN COLONIC MUCOSA. NO ACTIVE INFLAMMATION, MICROSCOPIC COLITIS, GRANULOMAS OR CHRONIC CHANGES.   IMPRESSION: *  Coffee Ground emesis/hematemesis, recurrent.  Same occurred in Feb 2014 and had solitary, oozing prepyloric ulcer on EGD.  Using Goodie's, 81 ASA, Plavix, Omeprazole 20 mg at the time.  Plavix restarted 6/2.  Compliant with BID Protonix and not using ASA/NSAIDs *  Mid abdominal pain, resolved.  Rule out UTI.  Pt is at risk for mesenteric vascular disease:  CT in 2012 showed thoracic aortic  vascular disease.  However does not have precedent of po associated abd pain or vomiting.  *  Cypher coronary stent 2006. Plavix restart 6/2.  *  LE weakness.  Doppler LE set for 6/16 to assess for ? claudication.  *  Narcotics dependent musculoskeletal pain and headaches. Not using any ASA products.  *  Hyperglycemia. Max 151.  No previous dx of DM.  *  Hx adenomatous colon polyp 2012  PLAN: *  EGD 1300 tomorrow  Clears today.  Stop IV Pepcid.  Continue IV BID Protonix. *  Ordered UA to rule out UTI as source of the sudden N/V and abdominal pain.    LOS: 1 day   Jennye Moccasin  12/06/2012, 8:44 AM Pager: [phone]  [facility] Attending  I have also seen and assessed the patient and agree with the above note. Patient and daughter aware of plans for EGD - I think this  reasonable to be sure prior ulcers have healed. She is off salicylates.  Iva Boop, MD, [facility] [phone] (pager) 12/06/2012 12:16 PM

## 2012-12-06 NOTE — Progress Notes (Signed)
Family Medicine Teaching Service Daily Progress Note Intern Pager: [phone]  Patient name: Kathy Marshall Medical record number: [id number] Date of birth: [birth year] Age: 78 y.o. Gender: female  Primary Care Provider: Leo Grosser, MD  Subjective: Patient is in good spirits this morning. She states that she did not sleep well "with all the noise" of the hospital, but that she is feeling "much better." She endorses a headache, but states that she has these regularly and that this particular one is not out of the ordinary; she denies any vision changes. She has not had nausea or vomiting overnight or this morning. She says she has some stomach pain, but only with direct pressure. She has been urinating, but has not had a bowel movement since admission.  Objective: Temp:  [96.6 F (35.9 C)-100.2 F (37.9 C)] 100.2 F (37.9 C) (06/12 0544) Pulse Rate:  [67-94] 82 (06/12 0544) Resp:  [17-26] 18 (06/12 0544) BP: (158-201)/(60-110) 164/84 mmHg (06/12 0544) SpO2:  [93 %-96 %] 94 % (06/12 0544) Weight:  [101 lb (45.813 kg)-101 lb 12.8 oz (46.176 kg)] 101 lb 12.8 oz (46.176 kg) (06/11 2041) Exam: General: Small elderly woman sitting comfortably in bed, interactive and appropriate Cardiovascular: Regular rate and rhythm, no murmur heard Respiratory: CTAB, good air movement Abdomen: Soft, non-tender to light palpation, endorsed pain with deep palpation to the periumbilical region, active bowel sounds Extremities: Wearing SCDs Neuro: Oriented to self, place ("hospital," "[place]"), day of the week, year, but not date ("19th")  Laboratory:  Recent Labs Lab 12/05/12 1425 12/05/12 1448 12/06/12 0440  WBC 11.9*  --  7.4  HGB 16.6* 17.3* 14.6  HCT 48.1* 51.0* 42.7  PLT 240  --  209    Recent Labs Lab 12/05/12 1425 12/05/12 1448 12/06/12 0440  K 4.3 4.0 3.0*    Imaging/Diagnostic Tests: 12/06/2012 - EKG: Normal   Assessment and Plan: Patient is a 78 y.o. year old female  with CAD s/p stent, COPD, and stomach ulcer with clipping 4 months ago who presents with hematemesis in setting of recently starting Plavix.   # Hematemesis: Likely from known stomach ulcer, triggered by use of Plavix. Hgb and vital signs remain stable, so there is no indication for transfusion at this time. FOBT checked 6/11 was negative. Patient has not had any vomiting since admission. Orthostatics were also checked this morning and found to be negative. - Continue telemetry - Continue IV protonix and famotidine BID  - IV zofran prn nausea [ ]  F/u GI consult [ ]  Transition to maintenance fluids with 1/2 NS + KCl [ ]  Recheck CBC in PM  # HTN: BP's markedly elevated in ER, most likely because patient did not take her AM medications. She was given metoprolol 2.5mg  IV x1 on admission and metoprolol 5mg  injection x2 this AM. Her pressures have stabilized today in the 158-166 systolic, 60-84 diastolic. Will restart home BP meds in the AM. - IV hydralazine prn SBP >180, DBP > 110  [ ]  Continue to monitor BPs  #Hypokalemia: Patient's K on 6/12 was low at 3.0. Patient received this AM orally. [ ]  Replete potassium via  IV KCl   # CAD: No complaints of chest pain or shortness of breath. EKG on 6/11 showed normal sinus rhythm and no abnormalities. - Hold Plavix in setting of acute GI bleed  - Continue home imdur   # PAD  - Will encourage patient to attend her scheduled outpatient appointment for lower extremity arterial doppler upon d/c  #  HLD:  - Continue home statin   # COPD:  - Continue home albuterol prn   # Chronic benzo use:  - Continue home xanax prn in hospital to avoid withdrawal   # Chronic pain:  - Continue home MS contin, percocet, gabapentin  - Continue home topamax for headache   # OSA: Patient does not use CPAP at home, so will not initiate while inpatient.  FEN/GI: NPO; will reassess after GI consult PPx: SCDs; anticoagulants not used 2/2 likely GI bleed Dispo:  Pending GI consult, resolution of hematemesis, and stabilization of HTN Code Status: FULL  Kittie Plater, Med Student 12/06/2012, 8:52 AM PGY-1, [facility] FPTS Intern pager: [phone]  PGY-2 Addendum S: Feeling better this morning.  Still with some nausea, but much improved.  Abdominal pain is also improved.  No bowel movement.   O:  BP 164/84  Pulse 82  Temp(Src) 100.2 F (37.9 C) (Oral)  Resp 18  Ht 5\' 2"  (1.575 m)  Wt 101 lb 12.8 oz (46.176 kg)  BMI 18.61 kg/m2  SpO2 94% Gen: alert, cooperative, NAD, some spitting, but no vomiting HEENT: moist mucous membranes CV: RRR, no murmurs Pulm: CTAB Abd: +BS, soft, ND, mildly tender in epigastric  Agree with excellent A/P above, briefly: A/P: 78 yo F with CAD s/p stent, h/o gastric ulcer s/p clipping, COPD presenting with coffee ground emesis.  # Hematemesis: Possible gastric irritation vs ulcer.  Hemoglobin dropped to 14, but I suspect she was hemoconcentrated on admission.  No further emesis and symptoms are improving.  FOBT was negative and she has not had further BMs. - recheck CBC at 1500 - f/u GI consult - continue IV PPI, H2 blocker  # Hypertension: Elevated currently.  Home meds to be restarted this am. - monitor BPs  FEN/GI: NPO for now; will change IVFs to 1/2NS+40KCl for maintenance as she appears to be volume resuscitated PPx: SCDs; no pharmacologic secondary to GI bleed  BOOTH, Tibor Lemmons 12/06/2012, 10:00 AM

## 2012-12-06 NOTE — Care Management Note (Unsigned)
    Page 1 of 1   12/06/2012     5:21:23 PM   CARE MANAGEMENT NOTE 12/06/2012  Patient:  Kathy Marshall, Kathy Marshall   Account Number:  [id number]  Date Initiated:  12/06/2012  Documentation initiated by:  Letha Cape  Subjective/Objective Assessment:   dx PCM  admit- lives with family.     Action/Plan:   Anticipated DC Date:  12/08/2012   Anticipated DC Plan:  HOME W HOME HEALTH SERVICES      DC Planning Services  CM consult      Choice offered to / List presented to:             Status of service:  In process, will continue to follow Medicare Important Message given?   (If response is "NO", the following Medicare IM given date fields will be blank) Date Medicare IM given:   Date Additional Medicare IM given:    Discharge Disposition:    Per UR Regulation:  Reviewed for med. necessity/level of care/duration of stay  If discussed at Long Length of Stay Meetings, dates discussed:    Comments:  12/06/12 17:20 Letha Cape RN, BSN [phone] patient lives with family, patient is for EGD tomorrow. NCM will continue to follow for dc needs.

## 2012-12-06 NOTE — Progress Notes (Signed)
FMTS Attending Admission Note: Nicolette Bang Pager [phone] I  have seen and examined this patient, reviewed their chart. I have discussed this patient with the resident. I agree with the resident's findings, assessment and care plan.  Patient feels much better today,denies any complaints,no N/V. Physical exam benign. BP trending down on medication,appreciate GI assessment,awaiting EGD tomorrow.

## 2012-12-07 ENCOUNTER — Encounter (HOSPITAL_COMMUNITY): Payer: Self-pay | Admitting: *Deleted

## 2012-12-07 ENCOUNTER — Encounter (HOSPITAL_COMMUNITY)
Admission: EM | Disposition: A | Discharge status: Home / Self Care | Payer: Self-pay | Source: Home / Self Care | Attending: Family Medicine

## 2012-12-07 DIAGNOSIS — K2990 Gastroduodenitis, unspecified, without bleeding: Secondary | ICD-10-CM

## 2012-12-07 DIAGNOSIS — K2970 Gastritis, unspecified, without bleeding: Secondary | ICD-10-CM | POA: Diagnosis present

## 2012-12-07 HISTORY — PX: ESOPHAGOGASTRODUODENOSCOPY: SHX5428

## 2012-12-07 LAB — CBC
Hemoglobin: 14 g/dL (ref 12.0–15.0)
MCH: 31.5 pg (ref 26.0–34.0)
MCV: 90.3 fL (ref 78.0–100.0)
RBC: 4.44 MIL/uL (ref 3.87–5.11)

## 2012-12-07 LAB — URINALYSIS, ROUTINE W REFLEX MICROSCOPIC
Ketones, ur: >80 mg/dL — AB
Specific Gravity, Urine: 1.027 (ref 1.005–1.030)
Urobilinogen, UA: 1 mg/dL (ref 0.0–1.0)

## 2012-12-07 LAB — BASIC METABOLIC PANEL
CO2: 22 mEq/L (ref 19–32)
Calcium: 8.6 mg/dL (ref 8.4–10.5)
Glucose, Bld: 96 mg/dL (ref 70–99)
Potassium: 3.2 mEq/L — ABNORMAL LOW (ref 3.5–5.1)
Sodium: 138 mEq/L (ref 135–145)

## 2012-12-07 LAB — URINE MICROSCOPIC-ADD ON

## 2012-12-07 SURGERY — EGD (ESOPHAGOGASTRODUODENOSCOPY)
Anesthesia: Moderate Sedation

## 2012-12-07 MED ORDER — FENTANYL CITRATE 0.05 MG/ML IJ SOLN
INTRAMUSCULAR | Status: AC
  Filled 2012-12-07: qty 2

## 2012-12-07 MED ORDER — POTASSIUM CHLORIDE CRYS ER 20 MEQ PO TBCR
40.0000 meq | EXTENDED_RELEASE_TABLET | Freq: Once | ORAL | Status: AC
  Administered 2012-12-07: 40 meq via ORAL
  Filled 2012-12-07: qty 2

## 2012-12-07 MED ORDER — FENTANYL CITRATE 0.05 MG/ML IJ SOLN
INTRAMUSCULAR | Status: DC | PRN
  Administered 2012-12-07 (×2): 25 ug via INTRAVENOUS

## 2012-12-07 MED ORDER — LOSARTAN POTASSIUM 50 MG PO TABS: 50.0000 mg | ORAL_TABLET | Freq: Every day | ORAL | Status: DC

## 2012-12-07 MED ORDER — BUTAMBEN-TETRACAINE-BENZOCAINE 2-2-14 % EX AERO
INHALATION_SPRAY | CUTANEOUS | Status: DC | PRN
  Administered 2012-12-07: 2 via TOPICAL

## 2012-12-07 MED ORDER — MIDAZOLAM HCL 5 MG/ML IJ SOLN
INTRAMUSCULAR | Status: AC
  Filled 2012-12-07: qty 2

## 2012-12-07 MED ORDER — MIDAZOLAM HCL 10 MG/2ML IJ SOLN
INTRAMUSCULAR | Status: DC | PRN
  Administered 2012-12-07: 1 mg via INTRAVENOUS
  Administered 2012-12-07: 2 mg via INTRAVENOUS
  Administered 2012-12-07: 1 mg via INTRAVENOUS

## 2012-12-07 NOTE — Op Note (Addendum)
[name] [address] [place], [address]   ENDOSCOPY PROCEDURE REPORT  PATIENT: Orrie, Lascano  MR#: [id number] BIRTHDATE: Nov 15, 1934 , 77  yrs. old GENDER: Female ENDOSCOPIST: Iva Boop, MD, [facility] PROCEDURE DATE:  12/07/2012 PROCEDURE:  EGD w/ biopsy ASA CLASS:     Class III INDICATIONS:  Hematemesis.  suspected MEDICATIONS: Fentanyl 50 mcg IV and Versed 4 mg IV TOPICAL ANESTHETIC: Cetacaine Spray  DESCRIPTION OF PROCEDURE: After the risks benefits and alternatives of the procedure were thoroughly explained, informed consent was obtained.  The PENTAX GASTOROSCOPE [id number] endoscope was introduced through the mouth and advanced to the second portion of the duodenum. Without limitations.  The instrument was slowly withdrawn as the mucosa was fully examined.        STOMACH: Moderate chronic gastritis (inflammation) was found in the entire examined stomach.  Multiple biopsies were performed using cold forceps.  Sample sent for histology.  ESOPHAGUS: A 2 cm hiatal hernia was noted.  The remainder of the upper endoscopy exam was otherwise normal. Retroflexed views revealed a hiatal hernia.     The scope was then withdrawn from the patient and the procedure completed.  COMPLICATIONS: There were no complications. ENDOSCOPIC IMPRESSION: 1.   Chronic gastritis (inflammation) was found in the entire examined stomach; multiple biopsies 2.   2 cm hiatal hernia 3.   The remainder of the upper endoscopy exam was otherwise normal  RECOMMENDATIONS: 1.  Await pathology results- will notify after dc 2.   OK to dc home soon on daily PPI and Plavix - not convinced she had bleeding - no bleeding sites here and prior ulcer is healed. Coffee grounds were probably gastro-enteric secretions, with stasis.  eSigned:  Iva Boop, MD, [facility] 12/07/2012 3:18 PMRevised: 12/07/2012 3:18 PMCC:The Patient

## 2012-12-07 NOTE — Discharge Summary (Signed)
Family Medicine Teaching [facility] Discharge Summary  Patient name: Kathy Marshall Medical record number: [id number] Date of birth: February 16, 1935 Age: 78 y.o. Gender: female Date of Admission: 12/05/2012  Date of Discharge: 12/07/12 Admitting Physician: Janit Pagan, MD  Primary Care Provider: Leo Grosser, MD  Indication for Hospitalization: Hematemesis, recurrent Discharge Diagnoses:  1. Hematemesis 2. Hypertension 3. Hypokalemia - improved 4. Hyperglycemia 5. CAD 6. PAD 7. Hyperlipidemia 8. COPD 9. Chronic benzo use 10. Chronic pain  Consultations: Iva Boop, MD of Gastroenterology  Significant Labs and Imaging:    Recent Labs Lab 12/06/12 0440 12/06/12 1515 12/07/12 1155  WBC 7.4 9.9 7.2  HGB 14.6 15.2* 14.0  HCT 42.7 43.6 40.1  PLT 209 200 185    Recent Labs Lab 12/05/12 1425 12/05/12 1448 12/06/12 0440 12/07/12 1155  NA 141 144 143 138  K 4.3 4.0 3.0* 3.2*  CL 104 109 109 106  CO2 23  --  21 22  GLUCOSE 148* 151* 123* 96  BUN 22 23 21 17   CREATININE 0.71 0.70 0.55 0.54  CALCIUM 9.9  --  9.1 8.6  ALKPHOS 106  --   --   --   AST 22  --   --   --   ALT 11  --   --   --   ALBUMIN 4.0  --   --   --    Urinalysis    Component Value Date/Time   COLORURINE YELLOW 12/07/2012 1843   APPEARANCEUR CLEAR 12/07/2012 1843   LABSPEC 1.027 12/07/2012 1843   PHURINE 6.0 12/07/2012 1843   GLUCOSEU NEGATIVE 12/07/2012 1843   HGBUR SMALL* 12/07/2012 1843   BILIRUBINUR MODERATE* 12/07/2012 1843   KETONESUR >80* 12/07/2012 1843   PROTEINUR NEGATIVE 12/07/2012 1843   UROBILINOGEN 1.0 12/07/2012 1843   NITRITE NEGATIVE 12/07/2012 1843   LEUKOCYTESUR NEGATIVE 12/07/2012 1843    Procedures:  12/07/12 - EGD: Chronic moderate gastritis, 2cm hiatal hernia, multiple biopsies taken  Brief Hospital Course: Patient is a 78 y.o. year old female with CAD s/p stent, COPD, and stomach ulcer with clipping 4 months ago who presents with hematemesis in setting of  recently starting Plavix. She was admitted directly from her PCPs office after a witnessed episode of coffee ground emesis. Please see H&P for more detail. The remainder of her hospital course is as follows by problem:  # Hematemesis: Likely from known stomach ulcer, triggered by recent initiation of Plavix. Hgb and vital signs remained stable through her stay. She remained afebrile and without pain throughout. She had three episodes of "brown" emesis the night of 6/12 along with a diarrheal incontinence event. Patient was kept NPO until her EGD on 6/13. The EGD showed chronic moderate gastritis, but no active source of bleeding. Multiple biopsies were taken for further examination. Upon discharge we continued her oral PPI, zofran for nausea. Biopsies will need to be followed up. Pt will f/u with GI as an outpatient.  # Hypertension: BP's markedly elevated in ER, most likely because patient did not take her AM medications on day of admission. She was given metoprolol 2.5mg  IV x1 on admission and metoprolol 5mg  injection x2 6/12 AM. Home BP meds were restarted with an increase in patient's home losartan from 25mg  to 50mg . Pressures were monitored and found to be normotensive on day of discharge. Recommend BMET in 1 week due to increase in losartan dose, and outpatient f/u on BP.  # Hematuria: UA obtained just prior to discharge noted to  have small hgb, moderate bili and >80 ketones. She was asymptomatic with stable vitals and tolerating PO so she was deemed ready for discharge, but this should be followed up as an outpatient.   #Hypokalemia: repleted orally and in IVF  # Hyperglycemia: Patient had a fasting CBG of 123. HgbA1c 5.3.   # CAD: No complaints of chest pain or shortness of breath. EKG on 6/11 showed normal sinus rhythm and appeared non-ischemic. Continued home imdur. Plavix was held upon admission due to concern for acute GI bleed, but given that no bleeding ulcer was found during EGD, this was  resumed upon discharge.  # PAD  Plavix continued upon discharge as above. Patient should attend her scheduled outpatient appointment for lower extremity arterial doppler upon d/c   # HLD:  - Continued home statin   # COPD: Patient had no difficulty breathing or oxygen requirement while hospitalized. Continued home albuterol prn   # Chronic benzo use: Continued home xanax prn in hospital to avoid withdrawal   # Chronic pain: Continued home MS contin, percocet, gabapentin. Continued home topamax for headache   Discharge Medications:    Medication List    TAKE these medications       ALPRAZolam 0.5 MG tablet  Commonly known as:  XANAX  Take 1 tablet (0.5 mg total) by mouth 3 (three) times daily as needed for sleep.     atorvastatin 80 MG tablet  Commonly known as:  LIPITOR  Take 80 mg by mouth at bedtime.     clopidogrel 75 MG tablet  Commonly known as:  PLAVIX  Take 1 tablet (75 mg total) by mouth daily.     COMPLETE PROTEIN/VITAMIN SHAKE PO  Take 2-3 Bottles by mouth daily.     cyclobenzaprine 5 MG tablet  Commonly known as:  FLEXERIL  Take 5 mg by mouth 3 (three) times daily as needed for muscle spasms.     ferrous sulfate 325 (65 FE) MG tablet  Take 325 mg by mouth daily.     gabapentin 300 MG capsule  Commonly known as:  NEURONTIN  Take 300 mg by mouth at bedtime.     isosorbide mononitrate 60 MG 24 hr tablet  Commonly known as:  IMDUR  Take 1 tablet (60 mg total) by mouth daily.     losartan 50 MG tablet  Commonly known as:  COZAAR  Take 1 tablet (50 mg total) by mouth daily.     metoprolol tartrate 25 MG tablet  Commonly known as:  LOPRESSOR  Take 0.5 tablets (12.5 mg total) by mouth 2 (two) times daily.     morphine 30 MG 12 hr tablet  Commonly known as:  MS CONTIN  Take 30 mg by mouth. Every 12 hours as needed for pain     ondansetron 4 MG tablet  Commonly known as:  ZOFRAN  Take 4 mg by mouth every 8 (eight) hours as needed for nausea.      oxyCODONE-acetaminophen 10-325 MG per tablet  Commonly known as:  PERCOCET  Take 1 tablet by mouth 5 (five) times daily as needed for pain.     pantoprazole 40 MG tablet  Commonly known as:  PROTONIX  TAKE 1 TABLET BY MOUTH TWICE A DAY     PROAIR HFA 108 (90 BASE) MCG/ACT inhaler  Generic drug:  albuterol  Inhale 2 puffs into the lungs.     topiramate 25 MG tablet  Commonly known as:  TOPAMAX  Take 25 mg by mouth  daily.       Disposition: Home  Issues for Follow Up:  - f/u any further hematemesis - f/u hematuria - recommend repeating UA and if still positive, consider further workup - f/u stomach biopsies taken during EGD - f/u blood pressure on increased dose of losartan, need BMET in one week   Outstanding Results:  - endoscopic biopsies  Discharge Instructions: Please refer to Patient Instructions section of EMR for full details.  Patient was counseled important signs and symptoms that should prompt return to medical care, changes in medications, dietary instructions, activity restrictions, and follow up appointments.   Follow-up Information   Schedule an appointment as soon as possible for a visit with [facility] TOM, MD. (Schedule an appointment to be seen on [facility] 12/10/12)    Contact information:   [address] [place] [address] [phone]       Schedule an appointment as soon as possible for a visit with [name], MD. (within 2 weeks)    Contact information:   [address] [place] [address] [phone]      Discharge Condition: Clinically improved with cessation of vomiting   Levert Feinstein, MD 12/07/2012, 11:32 PM

## 2012-12-07 NOTE — Progress Notes (Signed)
Family Medicine Teaching Service Daily Progress Note Intern Pager: [phone]  Patient name: Kathy Marshall Medical record number: [id number] Date of birth: [birth year] Age: 78 y.o. Gender: female  Primary Care Provider: Leo Grosser, MD  Subjective: Patient reports that she is not doing well this morning. She does not have any pain, but had three episodes of emesis overnight. She says the vomit had solid matter and was brown. Per nursing, the vomit did not have frank blood. The patient also had a significant episode of incontinent diarrhea while sleeping. Per nursing, the diarrhea was non-bloody. The patient endorses a "little headache" this morning that is not out of the ordinary. She denies any stomach pain, visions changes, or cardiac symptoms.  Objective: Temp:  [99.2 F (37.3 C)-99.4 F (37.4 C)] 99.4 F (37.4 C) (06/13 0530) Pulse Rate:  [71-79] 76 (06/13 0530) Resp:  [18-20] 20 (06/13 0530) BP: (150-185)/(80-98) 162/80 mmHg (06/13 0530) SpO2:  [95 %-96 %] 96 % (06/13 0530) Exam: General: Tired, frail woman in a loose fetal position on the bed; no acute distress Cardiovascular: Regular rate and rhythm, no murmur heard Respiratory: CTAB, no rhonchi or rales Abdomen: Soft, no TTP, soft bowel sounds Extremities: No edema, SCDs were off  Laboratory:  Recent Labs Lab 12/06/12 0440 12/06/12 1515 12/07/12 1155  WBC 7.4 9.9 7.2  HGB 14.6 15.2* 14.0  HCT 42.7 43.6 40.1  PLT 209 200 185    Recent Labs Lab 12/05/12 1425 12/05/12 1448 12/06/12 0440 12/07/12 1155  NA 141 144 143 138  K 4.3 4.0 3.0* 3.2*  CL 104 109 109 106  CO2 23  --  21 22  BUN 22 23 21 17   CREATININE 0.71 0.70 0.55 0.54  CALCIUM 9.9  --  9.1 8.6  PROT 8.1  --   --   --   BILITOT 0.4  --   --   --   ALKPHOS 106  --   --   --   ALT 11  --   --   --   AST 22  --   --   --   GLUCOSE 148* 151* 123* 96    Imaging/Diagnostic Tests: 12/07/12 - EGD scheduled for today at 1300  Assessment and  Plan: Patient is a 78 y.o. year old female with CAD s/p stent, COPD, and stomach ulcer with clipping 4 months ago who presents with hematemesis in setting of recently starting Plavix.   # Hematemesis: Likely from known stomach ulcer, triggered by use of Plavix. Hgb and vital signs remain stable, so there is no indication for transfusion at this time. FOBT checked 6/11 was negative. Patient has vomited x3 since admission; vomit was described as "brown" and without frank blood. Orthostatics were also checked on 6/12 and found to be negative. - Continue telemetry  - Continue IV protonix and famotidine BID  - IV zofran prn nausea  - Continue maintenance fluids with 1/2 NS + 40 KCl  [ ]  D/c famotidine per Gastroenterology [ ]  F/u EGD results [ ]  F/u UA ordered by Gastroenterology to r/o UTI as source of N/V [ ]  Consider CBC recheck depending on EGD results  # HTN: BP's markedly elevated in ER, most likely because patient did not take her AM medications. She was given metoprolol 2.5mg  IV x1 on admission and metoprolol 5mg  injection x2 6/12 AM. Home BP meds will be restarted today. Pressures overnight peaked at 185/92. - IV hydralazine prn SBP >180, DBP > 110  [ ]   Increase Cozaar to 50mg  [ ]  Continue to monitor BPs   #Hypokalemia: Patient's K on 6/12 was low at 3.0. Patient received this AM orally.  - Continue to replete potassium via IV KCl  - Recheck BMP today  # CAD: No complaints of chest pain or shortness of breath. EKG on 6/11 showed normal sinus rhythm and no abnormalities.  - Hold Plavix in setting of acute GI bleed  - Continue home imdur   # PAD  - Will encourage patient to attend her scheduled outpatient appointment for lower extremity arterial doppler upon d/c   # HLD:  - Continue home statin   # COPD:  - Continue home albuterol prn   # Chronic benzo use:  - Continue home xanax prn in hospital to avoid withdrawal   # Chronic pain:  - Continue home MS contin, percocet,  gabapentin  - Continue home topamax for headache   # OSA: Patient does not use CPAP at home, so will not initiate while inpatient.   FEN/GI: NPO; will reassess after GI consult; IVF 1/2 NS + 40 KCl  PPx: SCDs; anticoagulants not used 2/2 likely GI bleed  Dispo: Pending EGD, resolution of hematemesis, and stabilization of HTN  Code Status: FULL   Kittie Plater, Med Student 12/07/2012, 8:53 AM PGY-1, [facility] FPTS Intern pager: [phone]   --------------------------------------------------------------------------------------------------------------  PGY-1 Addendum: I saw Kathy Marshall this afternoon after her EGD. She reports she is feeling well. Denies abdominal pain or nausea. Would like to go home tonight if possible. Is tolerating liquids and plans to eat dinner in a little while.  Exam: BP 136/75  Pulse 73  Temp(Src) 98.9 F (37.2 C) (Oral)  Resp 22  Ht 5\' 2"  (1.575 m)  Wt 101 lb 12.8 oz (46.176 kg)  BMI 18.61 kg/m2  SpO2 95% Gen: NAD, pleasant and cooperative, lying in bed Heart: RRR, no murmurs Lungs: CTAB via anterior auscultation, normal respiratory effort Abd: soft, nontender to palpation, nondistended Ext: no appreciable edema in bilateral lower extremities Neuro: grossly nonfocal, speech intact  EGD was performed today by Dr. Leone Payor of GI, and showed chronic gastritis in the entire stomach. Multiple biopsies were taken. There was a 2cm hiatal hernia present. There was no site of bleeding noted.  A/P: 78 yo F with CAD s/p stent, h/o gastric ulcer s/p clipping, COPD presenting with reported coffee ground emesis.   # Hematemesis: Most likely secondary to gastric irritation as no bleeding ulcers were noted. Per GI, coffee grounds were likely actually gastroenteric secretions with stasis, and patient is okay to go home on daily PPI and plavix. CBC has remained stable during hospitalization. Patient is now without pain or complaints and vitals are  stable.  - will d/c home tonight if tolerates dinner - continue PPI and restart plavix per GI recs - obtain UA prior to discharge, will f/u with results over weekend  # Hypertension: much improved now after increase in losartan. Currently normotensive. - d/c home on home meds with increased dose of losartan - needs f/u bmet in one week due to increase in ARB  # Hypokalemia: K 3.2 today, did not receive any repletion except in IVF - will give PO once tonight before d/c to replete  FEN/GI: HH diet PPx: SCDs  Dispo: likely d/c home this PM.  Levert Feinstein, MD [facility] Medicine PGY-1 Service Pager [phone]

## 2012-12-07 NOTE — Progress Notes (Signed)
Patient was discharged via wheelchair. Daughter at bedside. Discharge instructions were given to both and signed. All IV's were removed.   Marcelyn Bruins RN

## 2012-12-07 NOTE — Progress Notes (Signed)
FMTS Attending Admission Note: Flint Hakeem,MD Pager [phone] I  have seen and examined this patient, reviewed their chart. I have discussed this patient with the resident. I agree with the resident's findings, assessment and care plan.  

## 2012-12-07 NOTE — Progress Notes (Signed)
     [facility] Daily Rounding Note 12/07/2012, 8:38 AM  SUBJECTIVE:      Feels bad.  Nausea with brown emesis persists.  Zofran helps but does not last.  Stools are loose.  No abdominal pain UA not yet collected.   OBJECTIVE:         Vital signs in last 24 hours:    Temp:  [99.2 F (37.3 C)-99.4 F (37.4 C)] 99.4 F (37.4 C) (06/13 0530) Pulse Rate:  [71-79] 76 (06/13 0530) Resp:  [18-20] 20 (06/13 0530) BP: (150-185)/(80-98) 162/80 mmHg (06/13 0530) SpO2:  [95 %-96 %] 96 % (06/13 0530) Last BM Date: 12/05/12 General: looks unwell   Heart: RRR Chest: clear Bil. No cough, no dyspnea Abdomen: soft, NT, ND.    Extremities: no pedal or extremity edema Neuro/Psych:  Alert/oriented x 3.  Affect depressed.  Pleasant and cooperative.   I Lab Results:  Recent Labs  12/05/12 1425 12/05/12 1448 12/06/12 0440 12/06/12 1515  WBC 11.9*  --  7.4 9.9  HGB 16.6* 17.3* 14.6 15.2*  HCT 48.1* 51.0* 42.7 43.6  PLT 240  --  209 200   BMET  Recent Labs  12/05/12 1425 12/05/12 1448 12/06/12 0440  NA 141 144 143  K 4.3 4.0 3.0*  CL 104 109 109  CO2 23  --  21  GLUCOSE 148* 151* 123*  BUN 22 23 21   CREATININE 0.71 0.70 0.55  CALCIUM 9.9  --  9.1   LFT  Recent Labs  12/05/12 1425  PROT 8.1  ALBUMIN 4.0  AST 22  ALT 11  ALKPHOS 106  BILITOT 0.4   PT/INR  Recent Labs  12/05/12 1425  LABPROT 12.4  INR 0.93    ASSESMENT: * Coffee Ground emesis/hematemesis, recurrent. Same occurred in Feb 2014 and had solitary, oozing prepyloric ulcer on EGD. Using [employer], 81 ASA, Plavix, Omeprazole 20 mg at the time. Plavix restarted 6/2. Compliant with BID Protonix and not using ASA/NSAIDs  * Mid abdominal pain, resolved. Rule out UTI. Pt is at risk for mesenteric vascular disease: CT in 2012 showed thoracic aortic vascular disease. However does not have precedent of po associated abd pain or vomiting.  * Cypher coronary stent 2006. Plavix restart 6/2.  *  Right mastoid effusion  on head CT of 5/28.  This was not addressed at ED or subsequent MD follow up. * LE weakness. Doppler LE set for 6/16 to assess for ? claudication.  * Narcotics dependent musculoskeletal pain and headaches. Not using any ASA products.  * Hyperglycemia. Max 151. No previous dx of DM.  * Hx adenomatous colon polyp 2012   PLAN: *  EGD today. *  Collect UA    LOS: 2 days   Jennye Moccasin  12/07/2012, 8:38 AM Pager: [phone]  Agree with Ms. Heron Nay assessment and plan. Iva Boop, MD, [facility]

## 2012-12-08 NOTE — Discharge Summary (Signed)
FMTS Attending Admission Note: Nicolette Bang I  have seen and examined this patient, reviewed their chart. I have discussed this patient with the resident. I agree with the resident's findings, assessment and care plan. EGD report reviewed.

## 2012-12-10 ENCOUNTER — Encounter (HOSPITAL_COMMUNITY): Payer: Medicare Other

## 2012-12-11 ENCOUNTER — Encounter: Payer: Self-pay | Admitting: Internal Medicine

## 2012-12-11 ENCOUNTER — Ambulatory Visit: Payer: Medicare Other | Admitting: Internal Medicine

## 2012-12-14 ENCOUNTER — Other Ambulatory Visit: Payer: Self-pay | Admitting: Family Medicine

## 2012-12-27 ENCOUNTER — Other Ambulatory Visit: Payer: Self-pay | Admitting: Family Medicine

## 2012-12-27 NOTE — Telephone Encounter (Signed)
meds refilled 

## 2012-12-27 NOTE — Telephone Encounter (Signed)
?   OK to Refill  

## 2012-12-27 NOTE — Telephone Encounter (Signed)
Approved for both.  Xanax # 60 / 0 refill Zofran # 30 / 0.

## 2012-12-29 ENCOUNTER — Other Ambulatory Visit: Payer: Self-pay | Admitting: Internal Medicine

## 2012-12-29 ENCOUNTER — Other Ambulatory Visit: Payer: Self-pay | Admitting: Family Medicine

## 2013-01-10 ENCOUNTER — Other Ambulatory Visit (HOSPITAL_COMMUNITY): Payer: Self-pay | Admitting: Family Medicine

## 2013-01-10 ENCOUNTER — Other Ambulatory Visit: Payer: Self-pay | Admitting: Family Medicine

## 2013-01-14 ENCOUNTER — Encounter: Payer: Self-pay | Admitting: Family Medicine

## 2013-01-14 ENCOUNTER — Ambulatory Visit (INDEPENDENT_AMBULATORY_CARE_PROVIDER_SITE_OTHER): Payer: Medicare Other | Admitting: Family Medicine

## 2013-01-14 VITALS — BP 100/78 | HR 78 | Temp 99.8°F | Resp 18 | Wt 96.0 lb

## 2013-01-14 DIAGNOSIS — J441 Chronic obstructive pulmonary disease with (acute) exacerbation: Secondary | ICD-10-CM

## 2013-01-14 MED ORDER — PREDNISONE 10 MG PO TABS: 10.0000 mg | ORAL_TABLET | Freq: Two times a day (BID) | ORAL | Status: DC

## 2013-01-14 MED ORDER — DOXYCYCLINE HYCLATE 100 MG PO TABS: 100.0000 mg | ORAL_TABLET | Freq: Two times a day (BID) | ORAL | Status: DC

## 2013-01-14 MED ORDER — METHYLPREDNISOLONE ACETATE 40 MG/ML IJ SUSP
40.0000 mg | Freq: Once | INTRAMUSCULAR | Status: AC
  Administered 2013-01-14: 40 mg via INTRAMUSCULAR

## 2013-01-14 MED ORDER — ALBUTEROL SULFATE HFA 108 (90 BASE) MCG/ACT IN AERS: 2.0000 | INHALATION_SPRAY | RESPIRATORY_TRACT | Status: DC | PRN

## 2013-01-14 NOTE — Assessment & Plan Note (Signed)
Continue albuterol every 4 hours as needed Use mucniex Take antibiotics as prescribed Depo medrol 40mg  IM x1 today, start prednisone tomorrow- she becomes very jittery with the prednisone, As her exacerbation is not severe, will try her on prednisone 10mg  BID see if she tolerates this better Return if no improvement and CXR if she declines

## 2013-01-14 NOTE — Progress Notes (Signed)
  Subjective:    Patient ID: Kathy Marshall, female    DOB: 1935-04-16, 78 y.o.   MRN: [id number]  HPI  Cough with production for past 4 days, +SOB, wheezing associated. Smokes 1ppd. History of COPD, has not used albuterol. Daughter states she uses Advair sometimes. No sick contacts. She was hospitalized last month with GI bleed. Denies chest pain. He does not use any over-the-counter medication  Review of Systems   GEN- denies fatigue, fever, weight loss,weakness, recent illness HEENT- denies eye drainage, change in vision, nasal discharge, CVS- denies chest pain, palpitations RESP- + SOB,+ cough, +wheeze ABD- denies N/V, change in stools, abd pain GU- denies dysuria, hematuria, dribbling, incontinence MSK- denies joint pain, muscle aches, injury Neuro- denies headache, dizziness, syncope, seizure activity      Objective:   Physical Exam  GEN- NAD, alert and oriented x3 HEENT- PERRL, EOMI, non injected sclera, pink conjunctiva, MMM, oropharynx clear Neck- Supple, no LAD CVS- RRR, no murmur RESP-bilateral wheeze, fair air movement, + rhonchi, harsh cough, normal WOB, no retractions EXT- No edema Pulses- Radial 2+   Status post neck improved air movement continues with bilateral wheeze     Assessment & Plan:

## 2013-01-14 NOTE — Patient Instructions (Signed)
Take antibiotics as prescribed- start today Start prednisone tomorrow Mucinex for the congestion Use albuterol every 4 hours for next day then as needed F/U if you do not improve

## 2013-01-16 ENCOUNTER — Inpatient Hospital Stay (HOSPITAL_COMMUNITY)
Admission: EM | Admit: 2013-01-16 | Discharge: 2013-01-19 | DRG: 193 | Disposition: A | Discharge status: Home / Self Care | Payer: Medicare Other | Attending: Internal Medicine | Admitting: Internal Medicine

## 2013-01-16 ENCOUNTER — Encounter (HOSPITAL_COMMUNITY): Payer: Self-pay

## 2013-01-16 ENCOUNTER — Emergency Department (HOSPITAL_COMMUNITY): Payer: Medicare Other

## 2013-01-16 DIAGNOSIS — M545 Low back pain, unspecified: Secondary | ICD-10-CM

## 2013-01-16 DIAGNOSIS — G9349 Other encephalopathy: Secondary | ICD-10-CM | POA: Diagnosis present

## 2013-01-16 DIAGNOSIS — F0390 Unspecified dementia without behavioral disturbance: Secondary | ICD-10-CM | POA: Diagnosis present

## 2013-01-16 DIAGNOSIS — F411 Generalized anxiety disorder: Secondary | ICD-10-CM | POA: Diagnosis present

## 2013-01-16 DIAGNOSIS — G9340 Encephalopathy, unspecified: Secondary | ICD-10-CM | POA: Diagnosis present

## 2013-01-16 DIAGNOSIS — R4182 Altered mental status, unspecified: Secondary | ICD-10-CM

## 2013-01-16 DIAGNOSIS — Z9861 Coronary angioplasty status: Secondary | ICD-10-CM

## 2013-01-16 DIAGNOSIS — I10 Essential (primary) hypertension: Secondary | ICD-10-CM

## 2013-01-16 DIAGNOSIS — E7800 Pure hypercholesterolemia, unspecified: Secondary | ICD-10-CM | POA: Diagnosis present

## 2013-01-16 DIAGNOSIS — Z79899 Other long term (current) drug therapy: Secondary | ICD-10-CM

## 2013-01-16 DIAGNOSIS — I2510 Atherosclerotic heart disease of native coronary artery without angina pectoris: Secondary | ICD-10-CM | POA: Diagnosis present

## 2013-01-16 DIAGNOSIS — F17200 Nicotine dependence, unspecified, uncomplicated: Secondary | ICD-10-CM | POA: Diagnosis present

## 2013-01-16 DIAGNOSIS — I739 Peripheral vascular disease, unspecified: Secondary | ICD-10-CM | POA: Diagnosis present

## 2013-01-16 DIAGNOSIS — F1120 Opioid dependence, uncomplicated: Secondary | ICD-10-CM

## 2013-01-16 DIAGNOSIS — G8929 Other chronic pain: Secondary | ICD-10-CM | POA: Diagnosis present

## 2013-01-16 DIAGNOSIS — J449 Chronic obstructive pulmonary disease, unspecified: Secondary | ICD-10-CM

## 2013-01-16 DIAGNOSIS — Z8249 Family history of ischemic heart disease and other diseases of the circulatory system: Secondary | ICD-10-CM

## 2013-01-16 DIAGNOSIS — I252 Old myocardial infarction: Secondary | ICD-10-CM

## 2013-01-16 DIAGNOSIS — T17908A Unspecified foreign body in respiratory tract, part unspecified causing other injury, initial encounter: Secondary | ICD-10-CM

## 2013-01-16 DIAGNOSIS — J189 Pneumonia, unspecified organism: Secondary | ICD-10-CM

## 2013-01-16 DIAGNOSIS — Z7902 Long term (current) use of antithrombotics/antiplatelets: Secondary | ICD-10-CM

## 2013-01-16 DIAGNOSIS — J849 Interstitial pulmonary disease, unspecified: Secondary | ICD-10-CM | POA: Diagnosis present

## 2013-01-16 DIAGNOSIS — Z823 Family history of stroke: Secondary | ICD-10-CM

## 2013-01-16 DIAGNOSIS — K219 Gastro-esophageal reflux disease without esophagitis: Secondary | ICD-10-CM | POA: Diagnosis present

## 2013-01-16 DIAGNOSIS — G4733 Obstructive sleep apnea (adult) (pediatric): Secondary | ICD-10-CM | POA: Diagnosis present

## 2013-01-16 DIAGNOSIS — Z801 Family history of malignant neoplasm of trachea, bronchus and lung: Secondary | ICD-10-CM

## 2013-01-16 DIAGNOSIS — J4489 Other specified chronic obstructive pulmonary disease: Secondary | ICD-10-CM

## 2013-01-16 DIAGNOSIS — Z800 Family history of malignant neoplasm of digestive organs: Secondary | ICD-10-CM

## 2013-01-16 LAB — CBC WITH DIFFERENTIAL/PLATELET
Basophils Absolute: 0 10*3/uL (ref 0.0–0.1)
HCT: 40.7 % (ref 36.0–46.0)
Hemoglobin: 13.8 g/dL (ref 12.0–15.0)
Lymphocytes Relative: 7 % — ABNORMAL LOW (ref 12–46)
Lymphs Abs: 0.7 10*3/uL (ref 0.7–4.0)
Monocytes Absolute: 0.8 10*3/uL (ref 0.1–1.0)
Monocytes Relative: 8 % (ref 3–12)
Neutro Abs: 8.6 10*3/uL — ABNORMAL HIGH (ref 1.7–7.7)
RBC: 4.3 MIL/uL (ref 3.87–5.11)
RDW: 14.8 % (ref 11.5–15.5)
WBC: 10.1 10*3/uL (ref 4.0–10.5)

## 2013-01-16 LAB — COMPREHENSIVE METABOLIC PANEL
AST: 23 U/L (ref 0–37)
CO2: 20 mEq/L (ref 19–32)
Chloride: 106 mEq/L (ref 96–112)
Creatinine, Ser: 0.62 mg/dL (ref 0.50–1.10)
GFR calc Af Amer: >90 mL/min (ref >90)
GFR calc non Af Amer: 85 mL/min — ABNORMAL LOW (ref >90)
Glucose, Bld: 114 mg/dL — ABNORMAL HIGH (ref 70–99)
Total Bilirubin: 0.3 mg/dL (ref 0.3–1.2)

## 2013-01-16 LAB — URINALYSIS, ROUTINE W REFLEX MICROSCOPIC
Hgb urine dipstick: NEGATIVE
Protein, ur: NEGATIVE mg/dL
Urobilinogen, UA: 2 mg/dL — ABNORMAL HIGH (ref 0.0–1.0)

## 2013-01-16 LAB — TROPONIN I
Troponin I: <0.3 ng/mL (ref <0.30)
Troponin I: <0.3 ng/mL (ref <0.30)

## 2013-01-16 LAB — PROCALCITONIN: Procalcitonin: <0.1 ng/mL

## 2013-01-16 MED ORDER — IPRATROPIUM BROMIDE 0.02 % IN SOLN: 0.5000 mg | Freq: Four times a day (QID) | RESPIRATORY_TRACT | Status: DC

## 2013-01-16 MED ORDER — MORPHINE SULFATE 2 MG/ML IJ SOLN
2.0000 mg | INTRAMUSCULAR | Status: DC | PRN
  Administered 2013-01-16 – 2013-01-19 (×10): 2 mg via INTRAVENOUS
  Filled 2013-01-16 (×10): qty 1

## 2013-01-16 MED ORDER — BIOTENE DRY MOUTH MT LIQD
15.0000 mL | Freq: Two times a day (BID) | OROMUCOSAL | Status: DC
  Administered 2013-01-16 – 2013-01-17 (×4): 15 mL via OROMUCOSAL

## 2013-01-16 MED ORDER — SODIUM CHLORIDE 0.9 % IV SOLN
1000.0000 mL | INTRAVENOUS | Status: DC
  Administered 2013-01-16: 1000 mL via INTRAVENOUS

## 2013-01-16 MED ORDER — ACETAMINOPHEN 325 MG PO TABS
650.0000 mg | ORAL_TABLET | Freq: Once | ORAL | Status: AC
  Administered 2013-01-16: 650 mg via ORAL
  Filled 2013-01-16: qty 2

## 2013-01-16 MED ORDER — ALBUTEROL SULFATE (5 MG/ML) 0.5% IN NEBU: 2.5000 mg | INHALATION_SOLUTION | Freq: Four times a day (QID) | RESPIRATORY_TRACT | Status: DC

## 2013-01-16 MED ORDER — DEXTROSE 5 % IV SOLN
500.0000 mg | Freq: Once | INTRAVENOUS | Status: AC
  Administered 2013-01-16: 500 mg via INTRAVENOUS
  Filled 2013-01-16: qty 500

## 2013-01-16 MED ORDER — TOPIRAMATE 25 MG PO TABS
25.0000 mg | ORAL_TABLET | Freq: Every day | ORAL | Status: DC
  Administered 2013-01-16 – 2013-01-19 (×4): 25 mg via ORAL
  Filled 2013-01-16 (×4): qty 1

## 2013-01-16 MED ORDER — SODIUM CHLORIDE 0.9 % IV SOLN
1000.0000 mL | Freq: Once | INTRAVENOUS | Status: AC
  Administered 2013-01-16: 1000 mL via INTRAVENOUS

## 2013-01-16 MED ORDER — ENOXAPARIN SODIUM 30 MG/0.3ML ~~LOC~~ SOLN
20.0000 mg | SUBCUTANEOUS | Status: DC
  Administered 2013-01-16 – 2013-01-19 (×4): 20 mg via SUBCUTANEOUS
  Filled 2013-01-16 (×5): qty 0.2

## 2013-01-16 MED ORDER — SODIUM CHLORIDE 0.45 % IV SOLN
INTRAVENOUS | Status: DC
  Administered 2013-01-16 – 2013-01-17 (×4): via INTRAVENOUS

## 2013-01-16 MED ORDER — CHLORHEXIDINE GLUCONATE 0.12 % MT SOLN
15.0000 mL | Freq: Two times a day (BID) | OROMUCOSAL | Status: DC
  Administered 2013-01-16 – 2013-01-17 (×4): 15 mL via OROMUCOSAL
  Filled 2013-01-16 (×7): qty 15

## 2013-01-16 MED ORDER — DEXTROSE 5 % IV SOLN
1.0000 g | Freq: Once | INTRAVENOUS | Status: AC
  Administered 2013-01-16: 1 g via INTRAVENOUS
  Filled 2013-01-16: qty 10

## 2013-01-16 MED ORDER — ALBUTEROL SULFATE (5 MG/ML) 0.5% IN NEBU: 2.5000 mg | INHALATION_SOLUTION | Freq: Four times a day (QID) | RESPIRATORY_TRACT | Status: DC | PRN

## 2013-01-16 MED ORDER — ENOXAPARIN SODIUM 40 MG/0.4ML ~~LOC~~ SOLN
40.0000 mg | SUBCUTANEOUS | Status: DC
  Filled 2013-01-16: qty 0.4

## 2013-01-16 MED ORDER — ISOSORBIDE MONONITRATE ER 60 MG PO TB24
60.0000 mg | ORAL_TABLET | Freq: Every day | ORAL | Status: DC
Start: 2013-01-16 — End: 2013-01-19
  Administered 2013-01-16 – 2013-01-19 (×4): 60 mg via ORAL
  Filled 2013-01-16 (×4): qty 1

## 2013-01-16 MED ORDER — DEXTROSE 5 % IV SOLN
500.0000 mg | INTRAVENOUS | Status: DC
  Administered 2013-01-17: 500 mg via INTRAVENOUS
  Filled 2013-01-16: qty 500

## 2013-01-16 MED ORDER — SODIUM CHLORIDE 0.9 % IJ SOLN
3.0000 mL | Freq: Two times a day (BID) | INTRAMUSCULAR | Status: DC
  Administered 2013-01-16 – 2013-01-19 (×3): 3 mL via INTRAVENOUS

## 2013-01-16 MED ORDER — DEXTROSE 5 % IV SOLN
1.0000 g | INTRAVENOUS | Status: DC
  Administered 2013-01-17: 1 g via INTRAVENOUS
  Filled 2013-01-16: qty 10

## 2013-01-16 MED ORDER — PANTOPRAZOLE SODIUM 40 MG PO TBEC
40.0000 mg | DELAYED_RELEASE_TABLET | Freq: Two times a day (BID) | ORAL | Status: DC
  Administered 2013-01-16 – 2013-01-19 (×7): 40 mg via ORAL
  Filled 2013-01-16 (×7): qty 1
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
## 2013-01-16 NOTE — Progress Notes (Signed)
Utilization review completed.  P.J. Symone Cornman,RN,BSN Case Manager [phone]  

## 2013-01-16 NOTE — ED Notes (Signed)
Attempted to call report to 5W, was asked to call back in 5 mins

## 2013-01-16 NOTE — ED Notes (Signed)
Admitting md at bedside to eval pt 

## 2013-01-16 NOTE — Progress Notes (Signed)
ANTIBIOTIC CONSULT NOTE - INITIAL  Pharmacy Consult for Renal Dose Adjustment of Antibiotics Indication: rule out pneumonia  No Known Allergies  Patient Measurements:   Height = 62 inches  Weight = 43 kg   Vital Signs: Temp: 98.8 F (37.1 C) (07/23 1056) Temp src: Oral (07/23 1056) BP: 128/66 mmHg (07/23 1056) Pulse Rate: 85 (07/23 1056) Intake/Output from previous day: 07/22 0701 - 07/23 0700 In: -  Out: 325 [Urine:325] Intake/Output from this shift: Total I/O In: 1000 [I.V.:1000] Out: -   Labs:  Recent Labs  01/16/13 0658  WBC 10.1  HGB 13.8  PLT 216  CREATININE 0.62   The CrCl is unknown because both a height and weight (above a minimum accepted value) are required for this calculation.  CrCl ~ 40 ml/min   Microbiology: No results found for this or any previous visit (from the past 720 hour(s)).  Medical History: Past Medical History  Diagnosis Date  . Low back pain   . Bronchitis   . Chronic hoarseness   . Sleep apnea, obstructive   . Renal artery stenosis   . PVD (peripheral vascular disease)   . Hypercholesteremia   . Pain in limb   . COPD (chronic obstructive pulmonary disease)   . Benign essential hypertension   . Atherosclerosis     coronary vessel type  . Coronary artery disease   . Diverticulosis   . History of GI diverticular bleed   . History of esophagitis   . H. pylori infection   . Altered mental status   . Pneumonia   . TIA (transient ischemic attack) 07/2010  . Arthritis   . GERD (gastroesophageal reflux disease)   . History of blood clots   . GI bleed   . Tubular adenoma 2012  . Heart murmur   . CHF (congestive heart failure)   . Anginal pain   . Myocardial infarct 1990's    "I've only had one" (12/06/2012)  . Exertional shortness of breath   . Daily headache   . Anxiety   . Gastric ulcer 07/2012    large; required clipping Hattie Perch 08/23/2012 (12/06/2012)    Medications:  Scheduled:  . albuterol  2.5 mg Nebulization  Q6H  . antiseptic oral rinse  15 mL Mouth Rinse q12n4p  . [START ON 01/17/2013] azithromycin  500 mg Intravenous Q24H  . [START ON 01/17/2013] cefTRIAXone (ROCEPHIN)  IV  1 g Intravenous Q24H  . chlorhexidine  15 mL Mouth Rinse BID  . enoxaparin (LOVENOX) injection  40 mg Subcutaneous Q24H  . ipratropium  0.5 mg Nebulization Q6H  . isosorbide mononitrate  60 mg Oral Daily  . pantoprazole  40 mg Oral BID  . sodium chloride  3 mL Intravenous Q12H  . topiramate  25 mg Oral Daily   Assessment: 78 yo F initiated on Rocephin and Azithromycin for suspected CAP.  These medications do not require renal dose adjustment.  However, upon review of medication profile, noted pt on Lovenox 40 mg SQ daily.  Will adjust this medication accordingly based on low body weight and renal function.  Goal of Therapy:  Renal dose adjustment  Plan:  Change Lovenox to 20 mg SQ daily for VTE prophylaxis. Continue to monitor for signs and symptoms of bleeding. Rx will sign off.   [employer], Pharm.D., BCPS Clinical Pharmacist Pager [phone] 01/16/2013 11:21 AM

## 2013-01-16 NOTE — ED Provider Notes (Signed)
History    CSN: [id number] Arrival date & time 01/16/13  0617  First MD Initiated Contact with Patient 01/16/13 [phone]     Chief Complaint  Patient presents with  . Fever  . Altered Mental Status   (Consider location/radiation/quality/duration/timing/severity/associated sxs/prior Treatment) The history is provided by the patient and the EMS personnel. The history is limited by the condition of the patient (Altered mental status).  She was brought in by ambulance because of altered mental status and was noted to have a fever. Patient is unable to give any coherent history. The family has come in with the patient at this point. Past Medical History  Diagnosis Date  . Low back pain   . Bronchitis   . Chronic hoarseness   . Sleep apnea, obstructive   . Renal artery stenosis   . PVD (peripheral vascular disease)   . Hypercholesteremia   . Pain in limb   . COPD (chronic obstructive pulmonary disease)   . Benign essential hypertension   . Atherosclerosis     coronary vessel type  . Coronary artery disease   . Diverticulosis   . History of GI diverticular bleed   . History of esophagitis   . H. pylori infection   . Altered mental status   . Pneumonia   . TIA (transient ischemic attack) 07/2010  . Arthritis   . GERD (gastroesophageal reflux disease)   . History of blood clots   . GI bleed   . Tubular adenoma 2012  . Heart murmur   . CHF (congestive heart failure)   . Anginal pain   . Myocardial infarct 1990's    "I've only had one" (12/06/2012)  . Exertional shortness of breath   . Daily headache   . Anxiety   . Gastric ulcer 07/2012    large; required clipping Hattie Perch 08/23/2012 (12/06/2012)   Past Surgical History  Procedure Laterality Date  . Oophorectomy    . Coronary angioplasty with stent placement  2006    Cypher stent to RCA. On cath in 2008 stent described as widely patent.   . Esophagogastroduodenoscopy N/A 08/21/2012    Procedure: ESOPHAGOGASTRODUODENOSCOPY  (EGD);  Surgeon: Hart Carwin, MD;  Location: [facility] ENDOSCOPY;  Service: Endoscopy;  Laterality: N/A;  . Vaginal hysterectomy    . Esophagogastroduodenoscopy N/A 12/07/2012    Procedure: ESOPHAGOGASTRODUODENOSCOPY (EGD);  Surgeon: Iva Boop, MD;  Location: [facility] ENDOSCOPY;  Service: Endoscopy;  Laterality: N/A;   Family History  Problem Relation Age of Onset  . Heart disease Mother   . Stroke Mother   . Stomach cancer Mother   . Heart disease Father   . Stroke Father   . Heart disease Sister   . Colon cancer Neg Hx   . Lung cancer Sister    History  Substance Use Topics  . Smoking status: Current Every Day Smoker -- 1.00 packs/day for 60 years    Types: Cigarettes  . Smokeless tobacco: Never Used  . Alcohol Use: No     Comment: occasional   OB History   Grav Para Term Preterm Abortions TAB SAB Ect Mult Living                 Review of Systems  Unable to perform ROS: Mental status change    Allergies  Review of patient's allergies indicates no known allergies.  Home Medications   Current Outpatient Rx  Name  Route  Sig  Dispense  Refill  . albuterol (PROAIR HFA) 108 (90  BASE) MCG/ACT inhaler   Inhalation   Inhale 2 puffs into the lungs every 4 (four) hours as needed for wheezing.   1 Inhaler   6   . ALPRAZolam (XANAX) 0.5 MG tablet      TAKE 1 TABLET BY MOUTH 3 TIMES A DAY AS NEEDED FOR SLEEP   60 tablet   0   . atorvastatin (LIPITOR) 80 MG tablet   Oral   Take 80 mg by mouth at bedtime.         . clopidogrel (PLAVIX) 75 MG tablet   Oral   Take 1 tablet (75 mg total) by mouth daily.   90 tablet   3   . cyclobenzaprine (FLEXERIL) 5 MG tablet   Oral   Take 5 mg by mouth 3 (three) times daily as needed for muscle spasms.         Marland Kitchen doxycycline (VIBRA-TABS) 100 MG tablet   Oral   Take 1 tablet (100 mg total) by mouth 2 (two) times daily.   14 tablet   0   . ferrous sulfate 325 (65 FE) MG tablet      TAKE 1 TABLET BY MOUTH DAILY   30 tablet    2   . Fluticasone-Salmeterol (ADVAIR) 500-50 MCG/DOSE AEPB   Inhalation   Inhale 1 puff into the lungs every 12 (twelve) hours.         . gabapentin (NEURONTIN) 300 MG capsule   Oral   Take 300 mg by mouth at bedtime.         . isosorbide mononitrate (IMDUR) 60 MG 24 hr tablet   Oral   Take 1 tablet (60 mg total) by mouth daily.   30 tablet   6   . losartan (COZAAR) 50 MG tablet      TAKE 1 TABLET BY MOUTH EVERY DAY   30 tablet   0   . metoprolol tartrate (LOPRESSOR) 25 MG tablet   Oral   Take 0.5 tablets (12.5 mg total) by mouth 2 (two) times daily.         Marland Kitchen morphine (MS CONTIN) 30 MG 12 hr tablet   Oral   Take 30 mg by mouth. Every 12 hours as needed for pain         . Nutritional Supplements (COMPLETE PROTEIN/VITAMIN SHAKE PO)   Oral   Take 2-3 Bottles by mouth daily.         . ondansetron (ZOFRAN) 4 MG tablet   Oral   Take 4 mg by mouth every 8 (eight) hours as needed for nausea.         Marland Kitchen oxyCODONE-acetaminophen (PERCOCET) 10-325 MG per tablet   Oral   Take 1 tablet by mouth 5 (five) times daily as needed for pain.         . pantoprazole (PROTONIX) 40 MG tablet      TAKE 1 TABLET BY MOUTH TWICE A DAY   60 tablet   0     MUST HAVE OFFICE VISIT FOR FURTHER REFILLS!   Marland Kitchen predniSONE (DELTASONE) 10 MG tablet   Oral   Take 1 tablet (10 mg total) by mouth 2 (two) times daily.   10 tablet   0   . topiramate (TOPAMAX) 25 MG tablet   Oral   Take 25 mg by mouth daily.         . Vitamin D, Ergocalciferol, (DRISDOL) 50000 UNITS CAPS      TAKE 1 CAPSULE BY MOUTH EVERY WEEK  4 capsule   2    BP 132/93  Pulse 95  Temp(Src) 103.4 F (39.7 C) (Rectal)  Resp 18  SpO2 91% Physical Exam  Nursing note and vitals reviewed.  78 year old female, resting comfortably and in no acute distress. Vital signs are significant for fever with temperature 103.4, and borderline hypertension with blood pressure 132/93. Oxygen saturation is 91%, which is  hypoxic. Head is normocephalic and atraumatic. PERRLA, EOMI. Oropharynx is clear. Mucous membranes are dry. Neck is nontender and supple without adenopathy or JVD. Back is nontender and there is no CVA tenderness. Lungs  Have coarse bibasilar rales which are more prominent on the right. There no wheezes or rhonchi. Chest is nontender. Heart has regular rate and rhythm without murmur. Abdomen is soft, flat, nontender without masses or hepatosplenomegaly and peristalsis is normoactive. Extremities have no cyanosis or edema, full range of motion is present. Skin is warm and dry without rash. Neurologic: She is awake and alert and oriented to person but not place or time, cranial nerves are intact, there are no motor or sensory deficits.  ED Course  Procedures (including critical care time) Results for orders placed during the hospital encounter of 01/16/13  CBC WITH DIFFERENTIAL      Result Value Range   WBC 10.1  4.0 - 10.5 K/uL   RBC 4.30  3.87 - 5.11 MIL/uL   Hemoglobin 13.8  12.0 - 15.0 g/dL   HCT 09.8  11.9 - 14.7 %   MCV 94.7  78.0 - 100.0 fL   MCH 32.1  26.0 - 34.0 pg   MCHC 33.9  30.0 - 36.0 g/dL   RDW 82.9  56.2 - 13.0 %   Platelets 216  150 - 400 K/uL   Neutrophils Relative % 85 (*) 43 - 77 %   Neutro Abs 8.6 (*) 1.7 - 7.7 K/uL   Lymphocytes Relative 7 (*) 12 - 46 %   Lymphs Abs 0.7  0.7 - 4.0 K/uL   Monocytes Relative 8  3 - 12 %   Monocytes Absolute 0.8  0.1 - 1.0 K/uL   Eosinophils Relative 0  0 - 5 %   Eosinophils Absolute 0.0  0.0 - 0.7 K/uL   Basophils Relative 0  0 - 1 %   Basophils Absolute 0.0  0.0 - 0.1 K/uL  COMPREHENSIVE METABOLIC PANEL      Result Value Range   Sodium 139  135 - 145 mEq/L   Potassium 3.5  3.5 - 5.1 mEq/L   Chloride 106  96 - 112 mEq/L   CO2 20  19 - 32 mEq/L   Glucose, Bld 114 (*) 70 - 99 mg/dL   BUN 18  6 - 23 mg/dL   Creatinine, Ser 8.65  0.50 - 1.10 mg/dL   Calcium 9.1  8.4 - 78.4 mg/dL   Total Protein 6.8  6.0 - 8.3 g/dL    Albumin 2.9 (*) 3.5 - 5.2 g/dL   AST 23  0 - 37 U/L   ALT 12  0 - 35 U/L   Alkaline Phosphatase 91  39 - 117 U/L   Total Bilirubin 0.3  0.3 - 1.2 mg/dL   GFR calc non Af Amer 85 (*) >90 mL/min   GFR calc Af Amer >90  >90 mL/min  URINALYSIS, ROUTINE W REFLEX MICROSCOPIC      Result Value Range   Color, Urine YELLOW  YELLOW   APPearance CLOUDY (*) CLEAR   Specific Gravity, Urine 1.024  1.005 - 1.030   pH 7.0  5.0 - 8.0   Glucose, UA NEGATIVE  NEGATIVE mg/dL   Hgb urine dipstick NEGATIVE  NEGATIVE   Bilirubin Urine SMALL (*) NEGATIVE   Ketones, ur NEGATIVE  NEGATIVE mg/dL   Protein, ur NEGATIVE  NEGATIVE mg/dL   Urobilinogen, UA 2.0 (*) 0.0 - 1.0 mg/dL   Nitrite NEGATIVE  NEGATIVE   Leukocytes, UA NEGATIVE  NEGATIVE  PROCALCITONIN      Result Value Range   Procalcitonin <0.10    CG4 I-STAT (LACTIC ACID)      Result Value Range   Lactic Acid, Venous 1.61  0.5 - 2.2 mmol/L   Dg Chest Port 1 View  01/16/2013   *RADIOLOGY REPORT*  Clinical Data: Altered mental status.  Fever 103.  PORTABLE CHEST - 1 VIEW  Comparison: 11/21/2012  Findings: Cardiac enlargement.  Pulmonary vascularity is normal. There is evidence of airspace disease in the right lung base suggesting pneumonia.  This is developing since the previous study. No blunting of costophrenic angles.  No pneumothorax.  Calcified, tortuous, and ectatic aorta.  IMPRESSION: Developing airspace disease in the right lung base suggesting pneumonia.   Original Report Authenticated By: Burman Nieves, M.D.   Images viewed by me.   Date: 01/16/2013  Rate: 96  Rhythm: normal sinus rhythm  QRS Axis: normal  Intervals: normal  ST/T Wave abnormalities: normal  Conduction Disutrbances:none  Narrative Interpretation: Left ventricular hypertrophy, old anteroseptal myocardial infarction. When compared with ECG of 12/05/2012, no significant changes are seen  Old EKG Reviewed: unchanged   1. Community acquired pneumonia   2. Altered mental  status    CRITICAL CARE Performed by: Dione Booze Total critical care time: 35 minutes Critical care time was exclusive of separately billable procedures and treating other patients. Critical care was necessary to treat or prevent imminent or life-threatening deterioration. Critical care was time spent personally by me on the following activities: development of treatment plan with patient and/or surrogate as well as nursing, discussions with consultants, evaluation of patient's response to treatment, examination of patient, obtaining history from patient or surrogate, ordering and performing treatments and interventions, ordering and review of laboratory studies, ordering and review of radiographic studies, pulse oximetry and re-evaluation of patient's condition.  MDM  Fever with altered mental status and hypoxia. Level II code sepsis is activated and early goal-directed fluid therapy is initiated. Most likely source of fever is pneumonia but also consider UTI.  X-ray is consistent with pneumonia, urinalysis does not show evidence of infection. She is started on antibiotics for community-acquired pneumonia. Hre lactic acid level is normal and she does not appear overtly septic. Case is discussed with Dr. Modena Jansky of internal medicine teaching service who agrees to admit the patient.  Dione Booze, MD 01/16/13 [phone]

## 2013-01-16 NOTE — H&P (Signed)
Date: 01/16/2013               Patient Name:  Kathy Marshall MRN: [id number]  DOB: 03-11-1935 Age / Sex: 78 y.o., female   PCP: Donita Brooks, MD         Medical Service: Internal Medicine Teaching Service         Attending Physician: Dr. Bonnetta Barry att. providers found    First Contact: Dr. Mariea Clonts Pager: [phone]  Second Contact: Dr. Everardo Beals Pager: [phone]       After Hours (After 5p/  First Contact Pager: [phone]  weekends / holidays): Second Contact Pager: [phone]   Chief Complaint: altered mental status, fever  History of Present Illness:  The patient is a 78 YO female who comes in today with her family for confusion and decreased level of consciousness. She was having some cough for the last 4-5 days. She was seen by her doctor 2 days ago and diagnosed with bronchitis and started on doxycycline and prednisone. Her daughter helps to provide the history and states that her mother was confused starting last night at 1230 AM. She states that her mother has had episodes of confusion before however they lasted only about 20-30 minutes at most. She was admitted for GI bleed in June and the daughter states that at that time they were told she may have early dementia. Her other PMH includes COPD, CAD, HTN, chronic back pain. Her daughter does not state that her mother has had any fevers at home. She denies that her mother has been throwing up or feeling nauseous lately. She has not had diarrhea. She denies any pain currently including chest pain, abdominal pain. Her daughter mentions that she has chronic headache but it has not been different lately and she does have some back and leg pain from some degeneration. Her daughter mentions that her doctor thinks she needs a back surgery however they have not gotten around to this as of yet. None of her medications have changed recently although she does manage her own medications and adamantly refuses to let her family help her with medications. Her  daughter suspects that she may not always take them as instructed. She is still smoking about 1 PPD however does not currently use alcohol.   Meds: Current Facility-Administered Medications  Medication Dose Route Frequency Provider Last Rate Last Dose  . 0.9 %  sodium chloride infusion  1,000 mL Intravenous Continuous Dione Booze, MD      . azithromycin [facility]) 500 mg in dextrose 5 % 250 mL IVPB  500 mg Intravenous Once Dione Booze, MD 250 mL/hr at 01/16/13 0857 500 mg at 01/16/13 5784   Current Outpatient Prescriptions  Medication Sig Dispense Refill  . albuterol (PROAIR HFA) 108 (90 BASE) MCG/ACT inhaler Inhale 2 puffs into the lungs every 4 (four) hours as needed for wheezing.  1 Inhaler  6  . ALPRAZolam (XANAX) 0.5 MG tablet TAKE 1 TABLET BY MOUTH 3 TIMES A DAY AS NEEDED FOR SLEEP  60 tablet  0  . atorvastatin (LIPITOR) 80 MG tablet Take 80 mg by mouth at bedtime.      . clopidogrel (PLAVIX) 75 MG tablet Take 1 tablet (75 mg total) by mouth daily.  90 tablet  3  . cyclobenzaprine (FLEXERIL) 5 MG tablet Take 5 mg by mouth 3 (three) times daily as needed for muscle spasms.      Marland Kitchen doxycycline (VIBRA-TABS) 100 MG tablet Take 1 tablet (100 mg total) by mouth  2 (two) times daily.  14 tablet  0  . ferrous sulfate 325 (65 FE) MG tablet TAKE 1 TABLET BY MOUTH DAILY  30 tablet  2  . Fluticasone-Salmeterol (ADVAIR) 500-50 MCG/DOSE AEPB Inhale 1 puff into the lungs every 12 (twelve) hours.      . gabapentin (NEURONTIN) 300 MG capsule Take 300 mg by mouth at bedtime.      . isosorbide mononitrate (IMDUR) 60 MG 24 hr tablet Take 1 tablet (60 mg total) by mouth daily.  30 tablet  6  . losartan (COZAAR) 50 MG tablet TAKE 1 TABLET BY MOUTH EVERY DAY  30 tablet  0  . metoprolol tartrate (LOPRESSOR) 25 MG tablet Take 0.5 tablets (12.5 mg total) by mouth 2 (two) times daily.      Marland Kitchen morphine (MS CONTIN) 30 MG 12 hr tablet Take 30 mg by mouth. Every 12 hours as needed for pain      . Nutritional Supplements  (COMPLETE PROTEIN/VITAMIN SHAKE PO) Take 2-3 Bottles by mouth daily.      . ondansetron (ZOFRAN) 4 MG tablet Take 4 mg by mouth every 8 (eight) hours as needed for nausea.      Marland Kitchen oxyCODONE-acetaminophen (PERCOCET) 10-325 MG per tablet Take 1 tablet by mouth 5 (five) times daily as needed for pain.      . pantoprazole (PROTONIX) 40 MG tablet TAKE 1 TABLET BY MOUTH TWICE A DAY  60 tablet  0  . predniSONE (DELTASONE) 10 MG tablet Take 1 tablet (10 mg total) by mouth 2 (two) times daily.  10 tablet  0  . topiramate (TOPAMAX) 25 MG tablet Take 25 mg by mouth daily.      . Vitamin D, Ergocalciferol, (DRISDOL) 50000 UNITS CAPS TAKE 1 CAPSULE BY MOUTH EVERY WEEK  4 capsule  2    Allergies: Allergies as of 01/16/2013  . (No Known Allergies)   Past Medical History  Diagnosis Date  . Low back pain   . Bronchitis   . Chronic hoarseness   . Sleep apnea, obstructive   . Renal artery stenosis   . PVD (peripheral vascular disease)   . Hypercholesteremia   . Pain in limb   . COPD (chronic obstructive pulmonary disease)   . Benign essential hypertension   . Atherosclerosis     coronary vessel type  . Coronary artery disease   . Diverticulosis   . History of GI diverticular bleed   . History of esophagitis   . H. pylori infection   . Altered mental status   . Pneumonia   . TIA (transient ischemic attack) 07/2010  . Arthritis   . GERD (gastroesophageal reflux disease)   . History of blood clots   . GI bleed   . Tubular adenoma 2012  . Heart murmur   . CHF (congestive heart failure)   . Anginal pain   . Myocardial infarct 1990's    "I've only had one" (12/06/2012)  . Exertional shortness of breath   . Daily headache   . Anxiety   . Gastric ulcer 07/2012    large; required clipping Hattie Perch 08/23/2012 (12/06/2012)   Past Surgical History  Procedure Laterality Date  . Oophorectomy    . Coronary angioplasty with stent placement  2006    Cypher stent to RCA. On cath in 2008 stent described as  widely patent.   . Esophagogastroduodenoscopy N/A 08/21/2012    Procedure: ESOPHAGOGASTRODUODENOSCOPY (EGD);  Surgeon: Hart Carwin, MD;  Location: [facility] ENDOSCOPY;  Service: Endoscopy;  Laterality: N/A;  . Vaginal hysterectomy    . Esophagogastroduodenoscopy N/A 12/07/2012    Procedure: ESOPHAGOGASTRODUODENOSCOPY (EGD);  Surgeon: Iva Boop, MD;  Location: [facility] ENDOSCOPY;  Service: Endoscopy;  Laterality: N/A;   Family History  Problem Relation Age of Onset  . Heart disease Mother   . Stroke Mother   . Stomach cancer Mother   . Heart disease Father   . Stroke Father   . Heart disease Sister   . Colon cancer Neg Hx   . Lung cancer Sister    History   Social History  . Marital Status: Divorced    Spouse Name: N/A    Number of Children: 6  . Years of Education: N/A   Occupational History  . RETIRED    Social History Main Topics  . Smoking status: Current Every Day Smoker -- 1.00 packs/day for 60 years    Types: Cigarettes  . Smokeless tobacco: Never Used  . Alcohol Use: No     Comment: occasional  . Drug Use: No  . Sexually Active: No   Other Topics Concern  . Not on file   Social History Narrative   4-6 cups of coffee daily     Review of Systems: Due to altered mental status a full 10 point ROS was not performed however information that was able to be obtained is mentioned in the HPI.  Physical Exam: Blood pressure 131/64, pulse 87, temperature 103.4 F (39.7 C), temperature source Rectal, resp. rate 23, SpO2 95.00%. General: resting in bed, intermittently grabbing at the sheets, mumbling throughout our interview, very thin HEENT: PERRL, EOMI, no scleral icterus Cardiac: S1 S2 normal, no murmurs, no JVD Pulm: clear to auscultation bilaterally, moving normal volumes of air Abd: soft, nontender, nondistended, BS present Ext: warm and well perfused, no pedal edema Neuro: knows her name and is intermittently confused about where she is and is not oriented to year,  she is moving all four extremities and is able to follow basic commands   Lab results: Basic Metabolic Panel:  Recent Labs  16/10/96 0658  NA 139  K 3.5  CL 106  CO2 20  GLUCOSE 114*  BUN 18  CREATININE 0.62  CALCIUM 9.1   Liver Function Tests:  Recent Labs  01/16/13 0658  AST 23  ALT 12  ALKPHOS 91  BILITOT 0.3  PROT 6.8  ALBUMIN 2.9*   CBC:  Recent Labs  01/16/13 0658  WBC 10.1  NEUTROABS 8.6*  HGB 13.8  HCT 40.7  MCV 94.7  PLT 216   Urinalysis:  Recent Labs  01/16/13 0634  COLORURINE YELLOW  LABSPEC 1.024  PHURINE 7.0  GLUCOSEU NEGATIVE  HGBUR NEGATIVE  BILIRUBINUR SMALL*  KETONESUR NEGATIVE  PROTEINUR NEGATIVE  UROBILINOGEN 2.0*  NITRITE NEGATIVE  LEUKOCYTESUR NEGATIVE   Imaging results:  Dg Chest Port 1 View  01/16/2013   *RADIOLOGY REPORT*  Clinical Data: Altered mental status.  Fever 103.  PORTABLE CHEST - 1 VIEW  Comparison: 11/21/2012  Findings: Cardiac enlargement.  Pulmonary vascularity is normal. There is evidence of airspace disease in the right lung base suggesting pneumonia.  This is developing since the previous study. No blunting of costophrenic angles.  No pneumothorax.  Calcified, tortuous, and ectatic aorta.  IMPRESSION: Developing airspace disease in the right lung base suggesting pneumonia.   Original Report Authenticated By: Burman Nieves, M.D.    Other results: EKG: unchanged from previous tracings, normal sinus rhythm, possible LVH, some flattening of t waves since previous  tracing, no ST elevation or depression.  Assessment & Plan by Problem:  Acute encephalopathy - Likely cause is community acquired pneumonia however will rule out other causes. She has clear history of worsening cough with fevers and new infiltrate on CXR. U/A negative for infection. Will cycle CE to rule out ischemic event causing encephalopathy. No kidney failure. Will avoid sedating or altering medications. Will keep NPO until her mental status is  more clear and she is not an aspiration risk. Blood sugar check on admit was not hypoglycemic or hyperglycemic. Not having any focal neurologic signs or symptoms worrisome for stroke. She is not having nuchal rigidity or worsening headache to suggest bacterial meningitis.  -Admit to tele -Ceftriaxone/Azithro IV q 24 hours -Blood cultures times 2 (before antibiotics given) -Check urine strep and legionella antigens -Avoid sedating medications (although concern given chronic xanax usage not to cause withdrawal)  CAP (community acquired pneumonia) - This is the likely diagnosis given the symptoms and portable CXR. If concern for progression could check a CXR when mental status is improved and she is able to cooperate with study. Oxygen saturations were mostly around 91% while I was in the room but occasionally went down to 87%.  -As above -Ceftriaxone and Azithromycin IV, nebs with albuterol and atrovent  HYPERCHOLESTEROLEMIA - Will hold statin currently for decreased mental status.   HYPERTENSION, BENIGN ESSENTIAL - Will hold most of her medications as her blood pressure is not high. Will continue imdur if able to swallow later. Especially given her questionable medication compliance would not want to start back all of her listed medications at once.  COPD - Treating as pneumonia at this time. Will use albuterol and atrovent nebulizations as she is unable to use inhaler at this time. Will continue to re-assess but no prednisone at this time.   GERD - Continue protonix 40 mg BID especially given recent history of GI bleeding.  LOW BACK PAIN, CHRONIC - She is on long acting morphine and breakthrough percocet at home with robaxin and flexeril and gabapentin with prn xanax for anxiety. Will hold for now given confusion/sedation. Will order morphine 2 mg IV q 4 hours while unable to tolerate pills. Her daughter is concerned for over utilization at home of the percocet and will monitor for signs of pain  and treat pain.   CAD (coronary artery disease), tandem Cyper DES to prox. and Mid RCA 2006, stable at cath 2008 - Will rule out ACS with CE times 3 and observe on telemetry. She is not having active chest pain and seems she was on plavix at home. Will hold as she is NPO right now but will resume home medications as able.   DVT ppx - Lovenox [place] daily  Dispo: Disposition is deferred at this time, awaiting improvement of current medical problems. Anticipated discharge in approximately 2-3 day(s).   The patient does have a current PCP Donita Brooks, MD) and does not need an [facility] hospital follow-up appointment after discharge.  The patient does not have transportation limitations that hinder transportation to clinic appointments.  Signed: Judie Bonus, MD 01/16/2013, 9:05 AM

## 2013-01-17 ENCOUNTER — Encounter (HOSPITAL_COMMUNITY): Payer: Self-pay | Admitting: Internal Medicine

## 2013-01-17 ENCOUNTER — Inpatient Hospital Stay (HOSPITAL_COMMUNITY): Payer: Medicare Other

## 2013-01-17 DIAGNOSIS — J189 Pneumonia, unspecified organism: Secondary | ICD-10-CM | POA: Diagnosis present

## 2013-01-17 DIAGNOSIS — G9340 Encephalopathy, unspecified: Secondary | ICD-10-CM

## 2013-01-17 DIAGNOSIS — J449 Chronic obstructive pulmonary disease, unspecified: Secondary | ICD-10-CM

## 2013-01-17 DIAGNOSIS — I10 Essential (primary) hypertension: Secondary | ICD-10-CM

## 2013-01-17 DIAGNOSIS — M545 Low back pain: Secondary | ICD-10-CM

## 2013-01-17 DIAGNOSIS — F1920 Other psychoactive substance dependence, uncomplicated: Secondary | ICD-10-CM

## 2013-01-17 LAB — CBC WITH DIFFERENTIAL/PLATELET
Eosinophils Absolute: 0 10*3/uL (ref 0.0–0.7)
Eosinophils Relative: 0 % (ref 0–5)
HCT: 37.1 % (ref 36.0–46.0)
Hemoglobin: 13 g/dL (ref 12.0–15.0)
Lymphs Abs: 0.9 10*3/uL (ref 0.7–4.0)
MCH: 32 pg (ref 26.0–34.0)
MCV: 91.4 fL (ref 78.0–100.0)
Monocytes Relative: 4 % (ref 3–12)
Neutrophils Relative %: 88 % — ABNORMAL HIGH (ref 43–77)
RBC: 4.06 MIL/uL (ref 3.87–5.11)

## 2013-01-17 LAB — URINE CULTURE: Culture: NO GROWTH

## 2013-01-17 LAB — LEGIONELLA ANTIGEN, URINE

## 2013-01-17 LAB — BASIC METABOLIC PANEL
CO2: 18 mEq/L — ABNORMAL LOW (ref 19–32)
Glucose, Bld: 81 mg/dL (ref 70–99)
Potassium: 3.3 mEq/L — ABNORMAL LOW (ref 3.5–5.1)
Sodium: 136 mEq/L (ref 135–145)

## 2013-01-17 LAB — STREP PNEUMONIAE URINARY ANTIGEN: Strep Pneumo Urinary Antigen: NEGATIVE

## 2013-01-17 MED ORDER — KCL IN DEXTROSE-NACL 40-5-0.45 MEQ/L-%-% IV SOLN
INTRAVENOUS | Status: DC
  Administered 2013-01-17: 17:00:00 via INTRAVENOUS
  Filled 2013-01-17 (×2): qty 1000

## 2013-01-17 MED ORDER — TRAZODONE HCL 50 MG PO TABS
50.0000 mg | ORAL_TABLET | Freq: Every day | ORAL | Status: DC
  Administered 2013-01-17 – 2013-01-18 (×2): 50 mg via ORAL
  Filled 2013-01-17 (×4): qty 1

## 2013-01-17 MED ORDER — IPRATROPIUM BROMIDE 0.02 % IN SOLN
0.5000 mg | Freq: Four times a day (QID) | RESPIRATORY_TRACT | Status: DC
  Administered 2013-01-17: 0.5 mg via RESPIRATORY_TRACT
  Filled 2013-01-17: qty 2.5

## 2013-01-17 MED ORDER — VANCOMYCIN HCL IN DEXTROSE 1-5 GM/200ML-% IV SOLN
1000.0000 mg | INTRAVENOUS | Status: DC
  Administered 2013-01-17 – 2013-01-19 (×3): 1000 mg via INTRAVENOUS
  Filled 2013-01-17 (×3): qty 200

## 2013-01-17 MED ORDER — IPRATROPIUM BROMIDE 0.02 % IN SOLN: 0.5000 mg | Freq: Four times a day (QID) | RESPIRATORY_TRACT | Status: DC | PRN

## 2013-01-17 MED ORDER — PIPERACILLIN-TAZOBACTAM 3.375 G IVPB
3.3750 g | Freq: Three times a day (TID) | INTRAVENOUS | Status: DC
  Administered 2013-01-17 – 2013-01-19 (×7): 3.375 g via INTRAVENOUS
  Filled 2013-01-17 (×10): qty 50

## 2013-01-17 MED ORDER — LOSARTAN POTASSIUM 50 MG PO TABS
50.0000 mg | ORAL_TABLET | Freq: Every day | ORAL | Status: DC
  Administered 2013-01-17 – 2013-01-19 (×3): 50 mg via ORAL
  Filled 2013-01-17 (×3): qty 1

## 2013-01-17 NOTE — Progress Notes (Signed)
ANTIBIOTIC CONSULT NOTE - INITIAL  Pharmacy Consult for vancomycin and Zosyn Indication: rule out pneumonia  No Known Allergies  Patient Measurements: Height: 5\' 2"  (157.5 cm) Weight: 102 lb 4.7 oz (46.4 kg) IBW/kg (Calculated) : 50.1  Vital Signs: Temp: 99 F (37.2 C) (07/24 0432) Temp src: Oral (07/24 0432) BP: 151/82 mmHg (07/24 0432) Pulse Rate: 84 (07/24 0432) Intake/Output from previous day: 07/23 0701 - 07/24 0700 In: 1000 [I.V.:1000] Out: -  Intake/Output from this shift: Total I/O In: -  Out: 300 [Urine:300]  Labs:  Recent Labs  01/16/13 0658 01/17/13 0621  WBC 10.1  --   HGB 13.8  --   PLT 216  --   CREATININE 0.62 0.42*   Estimated Creatinine Clearance: 43.8 ml/min (by C-G formula based on Cr of 0.42). No results found for this basename: VANCOTROUGH, VANCOPEAK, VANCORANDOM, GENTTROUGH, GENTPEAK, GENTRANDOM, TOBRATROUGH, TOBRAPEAK, TOBRARND, AMIKACINPEAK, AMIKACINTROU, AMIKACIN,  in the last 72 hours   Microbiology: Recent Results (from the past 720 hour(s))  URINE CULTURE     Status: None   Collection Time    01/16/13  6:34 AM      Result Value Range Status   Specimen Description URINE, CATHETERIZED   Final   Special Requests NONE   Final   Culture  Setup Time 01/16/2013 08:52   Final   Colony Count NO GROWTH   Final   Culture NO GROWTH   Final   Report Status 01/17/2013 FINAL   Final  CULTURE, BLOOD (ROUTINE X 2)     Status: None   Collection Time    01/16/13  6:45 AM      Result Value Range Status   Specimen Description BLOOD LEFT ARM   Final   Special Requests BOTTLES DRAWN AEROBIC ONLY 5CC   Final   Culture  Setup Time 01/16/2013 12:54   Final   Culture     Final   Value:        BLOOD CULTURE RECEIVED NO GROWTH TO DATE CULTURE WILL BE HELD FOR 5 DAYS BEFORE ISSUING A FINAL NEGATIVE REPORT   Report Status PENDING   Incomplete  CULTURE, BLOOD (ROUTINE X 2)     Status: None   Collection Time    01/16/13  6:55 AM      Result Value Range  Status   Specimen Description BLOOD RIGHT HAND   Final   Special Requests BOTTLES DRAWN AEROBIC ONLY 5CC   Final   Culture  Setup Time 01/16/2013 12:54   Final   Culture     Final   Value:        BLOOD CULTURE RECEIVED NO GROWTH TO DATE CULTURE WILL BE HELD FOR 5 DAYS BEFORE ISSUING A FINAL NEGATIVE REPORT   Report Status PENDING   Incomplete    Medical History: Past Medical History  Diagnosis Date  . Low back pain   . Bronchitis   . Chronic hoarseness   . Sleep apnea, obstructive   . Renal artery stenosis   . PVD (peripheral vascular disease)   . Hypercholesteremia   . Pain in limb   . COPD (chronic obstructive pulmonary disease)   . Benign essential hypertension   . Atherosclerosis     coronary vessel type  . Coronary artery disease   . Diverticulosis   . History of GI diverticular bleed   . History of esophagitis   . H. pylori infection   . Altered mental status   . Pneumonia   . TIA (  transient ischemic attack) 07/2010  . Arthritis   . GERD (gastroesophageal reflux disease)   . History of blood clots   . GI bleed   . Tubular adenoma 2012  . Heart murmur   . CHF (congestive heart failure)   . Anginal pain   . Myocardial infarct 1990's    "I've only had one" (12/06/2012)  . Exertional shortness of breath   . Daily headache   . Anxiety   . Gastric ulcer 07/2012    large; required clipping Hattie Perch 08/23/2012 (12/06/2012)    Assessment: 78 y/o female admitted 7/23 with AMS and fever. Patient was started on ceftriaxone and azithromycin for CAP which have been discontinued. Pharmacy consulted to begin vancomycin and Zosyn for more broad coverage. Patient was febrile last night, afebrile this morning. CXR today suspicious for pneumonia. Renal function stable. Cultures negative thus far.  Azith 7/23>>7/24 Ceftriaxone 7/23>>7/24 Vanc 7/24>> Zosyn 7/24>>  7/23 UCx - neg 7/23BCx2 - ngtd  Goal of Therapy:  Vancomycin trough level 15-20 mcg/ml  Plan:  -Vancomycin 1000  mg IV q24h -Zosyn 3.375 g IV q8h to be infused over 4 hours -Monitor renal function, clinical course, and culture data  [facility], [place].D., BCPS Clinical Pharmacist Pager: [phone] 01/17/2013 11:15 AM

## 2013-01-17 NOTE — H&P (Signed)
INTERNAL MEDICINE TEACHING SERVICE Attending Admission Note  Date: 01/17/2013  Patient name: Kathy Marshall  Medical record number: [id number]  Date of birth: November 29, 1934    I have seen and evaluated Kathy Marshall and discussed their care with the Residency Team.  Laboratory studies reviewed, CXR reviewed, history reviewed. Cover for HCAP given hx of recent hospitalization. Suspect she was hypovolemic on admission, now with LLL PNA that needs to be treated as HCAP.  Agree with further evaluation for aspiration given hx.  Consider discussion of pain management regimen, as this can put her at risk for acute encephalopathy and aspiration.  Clinically improved this morning.  Jonah Blue, [place] 7/24/20142:43 PM

## 2013-01-17 NOTE — Progress Notes (Signed)
I have seen the patient and reviewed the daily progress note by Barbara Cower Phillippi MS IV and discussed the care of the patient with them.  See below for documentation of my findings, assessment, and plans.  Subjective: Mental status improved.  No complaints of SOB or increased work of breathing.  Objective: Vital signs in last 24 hours: Filed Vitals:   01/16/13 1056 01/16/13 1525 01/16/13 2049 01/17/13 0432  BP: 128/66 144/78 150/79 151/82  Pulse: 85 66 86 84  Temp: 98.8 F (37.1 C) 99.5 F (37.5 C) 100.1 F (37.8 C) 99 F (37.2 C)  TempSrc: Oral Oral Oral Oral  Resp: 20 20 18 20   Height: 5\' 2"  (1.575 m)     Weight: 102 lb 4.7 oz (46.4 kg)     SpO2: 92% 92% 92% 94%   Weight change:   Intake/Output Summary (Last 24 hours) at 01/17/13 1537 Last data filed at 01/17/13 4782  Gross per 24 hour  Intake      0 ml  Output    300 ml  Net   -300 ml   General: resting in bed, no acute distress Cardiac: RRR, no rubs, murmurs or gallops Pulm: coarse bibasilar breath sounds Abd: soft, nontender, nondistended, BS normoactive Ext: warm and well perfused, no pedal edema Neuro: alert and oriented X person, place, day of the week, not year  Lab Results: Reviewed and documented in Electronic Record Micro Results: Reviewed and documented in Electronic Record Studies/Results: Reviewed and documented in Electronic Record  Medications: I have reviewed the patient's current medications. Scheduled Meds: . antiseptic oral rinse  15 mL Mouth Rinse q12n4p  . chlorhexidine  15 mL Mouth Rinse BID  . enoxaparin (LOVENOX) injection  20 mg Subcutaneous Q24H  . ipratropium  0.5 mg Nebulization Q6H  . isosorbide mononitrate  60 mg Oral Daily  . losartan  50 mg Oral Daily  . pantoprazole  40 mg Oral BID  . piperacillin-tazobactam (ZOSYN)  IV  3.375 g Intravenous Q8H  . sodium chloride  3 mL Intravenous Q12H  . topiramate  25 mg Oral Daily  . traZODone  50 mg Oral QHS  . vancomycin  1,000 mg  Intravenous Q24H   Continuous Infusions: . dextrose 5 % and 0.45 % NaCl with KCl 40 mEq/L     PRN Meds:.albuterol, morphine injection  Assessment/Plan: Kathy Marshall is a 78 yo F with history of CAD, GERD, h/o GIB (diverticular), & gastric ulcer who was admitted on 01/16/13 with AMS.  #HCAP: recent hospitalization in 11/2012; CXR reveals LLL PNA.   -Change abx to vanc/zosyn with plans to transition to levaquin in 1-2 days -SLP to evaluate for aspiration risk --> Dys 3 with thin liquids & plans for SLP -IVF hydration with D5 1/2 NS @ 50cc/h given mild metabolic acidosis  -follow blood cx - NGTD   #Acute Encephalopathy: most likely secondary to infectious etiology compounded by multiple sedative medications.  Cardiac etiology r/o with CE (-) x 3.  -Continue to hold home extended release narcotics & xanax  #HTN: BP mildly elevated, but stable -Cont imdur, restart losartan   #COPD: No wheezing on exam today.  -Will hold steroids for now for concern of rebound and steroid induced psychosis in the elderly, reassess as needed -albuterol/atrovent  #Insomnia -Trazodone qHS prn  #GERD: protonix  #Chronic low back pain: Home regimen includes MS contin 30mg  BID and percocet 10-325 5x/day prn -Manage with morphine 2mg  IV q4 prn  #VTE ppx: lovenox  --> remainder  per MSIV note  Dispo: Disposition is deferred at this time, awaiting improvement of current medical problems.  Anticipated discharge in approximately 1-2 day(s).   The patient does have a current PCP Donita Brooks, MD) and does not need an [facility] hospital follow-up appointment after discharge.  The patient does not have transportation limitations that hinder transportation to clinic appointments.  .Services Needed at time of discharge: Y = Yes, Blank = No PT:   OT:   RN:   Equipment:   Other:     LOS: 1 day   Belia Heman, MD 01/17/2013, 3:37 PM

## 2013-01-17 NOTE — Evaluation (Signed)
Clinical/Bedside Swallow Evaluation Patient Details  Name: Kathy Marshall MRN: [id number] Date of Birth: 13-Jul-1934  Today's Date: 01/17/2013 Time: 1200-1239 SLP Time Calculation (min): 39 min  Past Medical History:  Past Medical History  Diagnosis Date  . Low back pain   . Bronchitis   . Chronic hoarseness   . Sleep apnea, obstructive   . Renal artery stenosis   . PVD (peripheral vascular disease)   . Hypercholesteremia   . Pain in limb   . COPD (chronic obstructive pulmonary disease)   . Benign essential hypertension   . Atherosclerosis     coronary vessel type  . Coronary artery disease   . Diverticulosis   . History of GI diverticular bleed   . History of esophagitis   . H. pylori infection   . Altered mental status   . Pneumonia   . TIA (transient ischemic attack) 07/2010  . Arthritis   . GERD (gastroesophageal reflux disease)   . History of blood clots   . GI bleed   . Tubular adenoma 2012  . Heart murmur   . CHF (congestive heart failure)   . Anginal pain   . Myocardial infarct 1990's    "I've only had one" (12/06/2012)  . Exertional shortness of breath   . Daily headache   . Anxiety   . Gastric ulcer 07/2012    large; required clipping Hattie Perch 08/23/2012 (12/06/2012)   Past Surgical History:  Past Surgical History  Procedure Laterality Date  . Oophorectomy    . Coronary angioplasty with stent placement  2006    Cypher stent to RCA. On cath in 2008 stent described as widely patent.   . Esophagogastroduodenoscopy N/A 08/21/2012    Procedure: ESOPHAGOGASTRODUODENOSCOPY (EGD);  Surgeon: Hart Carwin, MD;  Location[facility] ENDOSCOPY;  Service: Endoscopy;  Laterality: N/A;  . Vaginal hysterectomy    . Esophagogastroduodenoscopy N/A 12/07/2012    Procedure: ESOPHAGOGASTRODUODENOSCOPY (EGD);  Surgeon: Iva Boop, MD;  Location: [facility] ENDOSCOPY;  Service: Endoscopy;  Laterality: N/A;   HPI:  78 year old female admitted 01/16/13 due to increased confusion, and  cough, with decreased LOC x4-5 days. PMH significant for early dementia, COPD, esophagitis, GERD, PNA, TIA. CXR indicates BLL airspace disease. BSE ordered to assess swallow.   Assessment / Plan / Recommendation Clinical Impression  Pt is without clinical indications of aspiration, with no focal deficits impacting swallow musculature.  However, pt is at risk for aspiration given history of GERD, COPD, PNA and TIA.  Recommend objective study to fully assess swallow function and safety, as silent aspiration cannot be detected at bedside. In the interim, recommend dys 3 (soft) diet with chopped meats, thin liquids.      Aspiration Risk  Moderate    Diet Recommendation Dysphagia 3 (Mechanical Soft);Thin liquid (chop meats)   Liquid Administration via: Straw;Cup Medication Administration: Whole meds with liquid Supervision: Patient able to self feed Compensations: Slow rate;Small sips/bites Postural Changes and/or Swallow Maneuvers: Upright 30-60 min after meal;Seated upright 90 degrees    Other  Recommendations Recommended Consults: MBS Oral Care Recommendations: Oral care BID   Follow Up Recommendations  24 hour supervision/assistance    Frequency and Duration  (pending MBS results)   (pending MBS results)   Pertinent Vitals/Pain 10/10 back pain, RN informed    SLP Swallow Goals Patient will consume recommended diet without observed clinical signs of aspiration with: Minimal assistance Swallow Study Goal #1 - Progress: Progressing toward goal Patient will utilize recommended strategies during swallow  to increase swallowing safety with: Minimal assistance Swallow Study Goal #2 - Progress: Progressing toward goal   Swallow Study Prior Functional Status   Regular diet, thin liquids prior to admit.  Pt reports poor appetite.    General Date of Onset: 01/16/13 HPI: 78 year old female admitted 01/16/13 due to increased confusion, and cough, with decreased LOC x4-5 days. PMH significant  for early dementia, COPD, esophagitis, GERD, PNA, TIA. CXR indicates BLL airspace disease. BSE ordered to assess swallow. Type of Study: Bedside swallow evaluation Previous Swallow Assessment: none found Diet Prior to this Study: NPO Temperature Spikes Noted: No (low grade temp) Respiratory Status: Supplemental O2 delivered via (comment) ([place]@2L ) History of Recent Intubation: No Behavior/Cognition: Alert;Cooperative Oral Cavity - Dentition: Dentures, top;Dentures, bottom Self-Feeding Abilities: Able to feed self Patient Positioning: Upright in bed Baseline Vocal Quality: Clear Volitional Cough: Strong Volitional Swallow: Able to elicit    Oral/Motor/Sensory Function Overall Oral Motor/Sensory Function: Appears within functional limits for tasks assessed   Ice Chips Ice chips: Not tested   Thin Liquid Thin Liquid: Within functional limits Presentation: Straw    Nectar Thick Nectar Thick Liquid: Not tested   Honey Thick Honey Thick Liquid: Not tested   Puree Puree: Within functional limits Presentation: Self Fed   Solid   GO    Solid: Within functional limits Presentation: Self Fed      Kasin Tonkinson B. Murvin [facility], CCC-SLP [phone]  Leigh Aurora 01/17/2013,12:48 PM

## 2013-01-17 NOTE — Progress Notes (Signed)
Subjective: Patient is significantly more alert and conversant today. States that she does not remember how she got to be in the hospital and not entirely sure why she is here. She has had a mild fever overnight.  Objective: Vital signs in last 24 hours: Filed Vitals:   01/16/13 1056 01/16/13 1525 01/16/13 2049 01/17/13 0432  BP: 128/66 144/78 150/79 151/82  Pulse: 85 66 86 84  Temp: 98.8 F (37.1 C) 99.5 F (37.5 C) 100.1 F (37.8 C) 99 F (37.2 C)  TempSrc: Oral Oral Oral Oral  Resp: 20 20 18 20   Height: 5\' 2"  (1.575 m)     Weight: 46.4 kg (102 lb 4.7 oz)     SpO2: 92% 92% 92% 94%   Weight change:   Intake/Output Summary (Last 24 hours) at 01/17/13 1428 Last data filed at 01/17/13 0839  Gross per 24 hour  Intake      0 ml  Output    300 ml  Net   -300 ml   Exam: General: Alert and interactive. In no acute distress.  Lungs: Wheezing significantly improved. Crackles noted in the bases. CV: S1/S2 RRR no r/m/g Abdomen: Thin, NABS, soft, nondistended, nontender Extremities: Poor skin turgor, skin is 'loose.' No edema, no cyanosis, no clubbing. 2+ distal pulses Neuro/Psych: Alert and oriented to person and place, but not to time or situation. Attention substantially improved. Interactive, good eye contact, no delusional content, no AVH, does not appear to be responding to internal stimuli. CN grossly intact, no gross neurological deficits.  Lab Results: Basic Metabolic Panel:  Recent Labs Lab 01/16/13 0658 01/17/13 0621  NA 139 136  K 3.5 3.3*  CL 106 104  CO2 20 18*  GLUCOSE 114* 81  BUN 18 9  CREATININE 0.62 0.42*  CALCIUM 9.1 8.6   Liver Function Tests:  Recent Labs Lab 01/16/13 0658  AST 23  ALT 12  ALKPHOS 91  BILITOT 0.3  PROT 6.8  ALBUMIN 2.9*   CBC:  Recent Labs Lab 01/16/13 0658 01/17/13 1040  WBC 10.1 10.7*  NEUTROABS 8.6* 9.4*  HGB 13.8 13.0  HCT 40.7 37.1  MCV 94.7 91.4  PLT 216 191   Cardiac Enzymes:  Recent Labs Lab  01/16/13 1127 01/16/13 1748 01/17/13 0009  TROPONINI <0.30 <0.30 <0.30   CBG:  Recent Labs Lab 01/17/13 1219  GLUCAP 88   Urinalysis:  Recent Labs Lab 01/16/13 0634  COLORURINE YELLOW  LABSPEC 1.024  PHURINE 7.0  GLUCOSEU NEGATIVE  HGBUR NEGATIVE  BILIRUBINUR SMALL*  KETONESUR NEGATIVE  PROTEINUR NEGATIVE  UROBILINOGEN 2.0*  NITRITE NEGATIVE  LEUKOCYTESUR NEGATIVE    Micro Results: Recent Results (from the past 240 hour(s))  URINE CULTURE     Status: None   Collection Time    01/16/13  6:34 AM      Result Value Range Status   Specimen Description URINE, CATHETERIZED   Final   Special Requests NONE   Final   Culture  Setup Time 01/16/2013 08:52   Final   Colony Count NO GROWTH   Final   Culture NO GROWTH   Final   Report Status 01/17/2013 FINAL   Final  CULTURE, BLOOD (ROUTINE X 2)     Status: None   Collection Time    01/16/13  6:45 AM      Result Value Range Status   Specimen Description BLOOD LEFT ARM   Final   Special Requests BOTTLES DRAWN AEROBIC ONLY 5CC   Final   Culture  Setup Time 01/16/2013 12:54   Final   Culture     Final   Value:        BLOOD CULTURE RECEIVED NO GROWTH TO DATE CULTURE WILL BE HELD FOR 5 DAYS BEFORE ISSUING A FINAL NEGATIVE REPORT   Report Status PENDING   Incomplete  CULTURE, BLOOD (ROUTINE X 2)     Status: None   Collection Time    01/16/13  6:55 AM      Result Value Range Status   Specimen Description BLOOD RIGHT HAND   Final   Special Requests BOTTLES DRAWN AEROBIC ONLY 5CC   Final   Culture  Setup Time 01/16/2013 12:54   Final   Culture     Final   Value:        BLOOD CULTURE RECEIVED NO GROWTH TO DATE CULTURE WILL BE HELD FOR 5 DAYS BEFORE ISSUING A FINAL NEGATIVE REPORT   Report Status PENDING   Incomplete   Studies/Results: Dg Chest 2 View  01/17/2013   *RADIOLOGY REPORT*  Clinical Data: Cough.  CHEST - 2 VIEW  Comparison: 01/16/2013 and 11/21/2012.  Findings: The heart size and mediastinal contours are stable with  aortic atherosclerosis.  There is increased patchy opacity at the left lung base.  Previously demonstrated right basilar air space disease has resolved.  Small bilateral pleural effusions are noted. No acute osseous findings are seen.  IMPRESSION: New left lower lobe air space disease suspicious for pneumonia. Small bilateral pleural effusions.   Original Report Authenticated By: Carey Bullocks, M.D.   Dg Chest Port 1 View  01/16/2013   *RADIOLOGY REPORT*  Clinical Data: Altered mental status.  Fever 103.  PORTABLE CHEST - 1 VIEW  Comparison: 11/21/2012  Findings: Cardiac enlargement.  Pulmonary vascularity is normal. There is evidence of airspace disease in the right lung base suggesting pneumonia.  This is developing since the previous study. No blunting of costophrenic angles.  No pneumothorax.  Calcified, tortuous, and ectatic aorta.  IMPRESSION: Developing airspace disease in the right lung base suggesting pneumonia.   Original Report Authenticated By: Burman Nieves, M.D.   Medications: I have reviewed the patient's current medications. Scheduled Meds: . antiseptic oral rinse  15 mL Mouth Rinse q12n4p  . chlorhexidine  15 mL Mouth Rinse BID  . enoxaparin (LOVENOX) injection  20 mg Subcutaneous Q24H  . ipratropium  0.5 mg Nebulization Q6H  . isosorbide mononitrate  60 mg Oral Daily  . losartan  50 mg Oral Daily  . pantoprazole  40 mg Oral BID  . piperacillin-tazobactam (ZOSYN)  IV  3.375 g Intravenous Q8H  . sodium chloride  3 mL Intravenous Q12H  . topiramate  25 mg Oral Daily  . vancomycin  1,000 mg Intravenous Q24H   Continuous Infusions: . sodium chloride 50 mL/hr at 01/17/13 1056   PRN Meds:.albuterol, morphine injection Assessment/Plan: Principal Problem:   Acute encephalopathy Active Problems:   HYPERCHOLESTEROLEMIA   HYPERTENSION, BENIGN ESSENTIAL   COPD   GERD   LOW BACK PAIN, CHRONIC   CAD (coronary artery disease), tandem Cyper DES to prox. and Mid RCA 2006, stable at  cath 208   HCAP (healthcare-associated pneumonia)  #Delirium: With classic waxing and waning of attention, hallucinations, disorientation and transient delusions. Appears to have improved significantly as of this morning. She attends well, interacts well, remains somewhat disoriented to time and situation but is oriented to location and person. Etiology not entirely clear, very likely related to combination of factors, including heavy opioid use, infection,  and significant dehydration. u/a clean, normal CBGs -Holding home extended release narcotics, holding home xanax -IV morphine PRN -Start trazadone 50 mg PO QHS for sleep -continue managing HCAP as below -hydration status improved -encourage appropriate sleep wake cycle, keep blinds open, orient patient frequently  #HCAP: New pneumonia with hospitalization within the past 90 days. CXR PA/lateral after fluid resuscitation and with improved patient inspiration showing resolved RLL infiltration, however LLL subsegmental infiltrate is now evident. Aspiration still plausible. Bedside swallow unremarkable and SLP recommending modified barium swallow. -d/c ceftriaxone, azythro -start IV vanc and zosyn, to cover HCAP and aspiration -crackles on exam, reduced fluids to 50 cc/hr  #AG metabolic acidosis: Bicarb 18, AG 14. Patient has been NPO and has not received dextrose, likely represents a starvation ketosis. Not febrile, not tachycardic, not ill appearing presently, don't suspect lactic acidosis. -switch to D5 1/2 NS at 50 cc/hr  #COPD- Wheezing resolved. Main issue appears to be pneumonia +/- aspiration. Reluctant to start steroids at this time. -Albuterol, ipratropium nebs  #GERD: Protonix 40mg  BID, recent hx of GI bleed  #CAD: CEs -ive x3.   #HTN: Patient hypertensive after fluid repletion. -restart cozaar 50 mg daily -continue imdur,   #Chronic Pain: Taking MS contin 30mg  BID at home with additional oxycodone 10mg /325 5 times a day PRN.   -morphine 2mg  IV q4 PRN in hospital  #VTE ppx: Lovenox  Dispo: Deferred until further stabilization of medical conditions. Likely 1-2 days.  This is a Psychologist, occupational Note.  The care of the patient was discussed with Dr. Everardo Beals and the assessment and plan formulated with their assistance.  Please see their attached note for official documentation of the daily encounter.   LOS: 1 day   Salvatore Marvel, Med Student 01/17/2013, 2:28 PM

## 2013-01-18 ENCOUNTER — Inpatient Hospital Stay (HOSPITAL_COMMUNITY): Payer: Medicare Other

## 2013-01-18 DIAGNOSIS — T17908A Unspecified foreign body in respiratory tract, part unspecified causing other injury, initial encounter: Secondary | ICD-10-CM

## 2013-01-18 LAB — BASIC METABOLIC PANEL
CO2: 21 mEq/L (ref 19–32)
Calcium: 9.1 mg/dL (ref 8.4–10.5)
Glucose, Bld: 123 mg/dL — ABNORMAL HIGH (ref 70–99)
Sodium: 137 mEq/L (ref 135–145)

## 2013-01-18 MED ORDER — POTASSIUM CHLORIDE CRYS ER 20 MEQ PO TBCR
40.0000 meq | EXTENDED_RELEASE_TABLET | ORAL | Status: AC
  Administered 2013-01-18 (×2): 40 meq via ORAL
  Filled 2013-01-18 (×2): qty 2

## 2013-01-18 MED ORDER — ACETAMINOPHEN 500 MG PO TABS
500.0000 mg | ORAL_TABLET | Freq: Once | ORAL | Status: AC
  Administered 2013-01-18: 500 mg via ORAL
  Filled 2013-01-18 (×2): qty 1

## 2013-01-18 NOTE — Progress Notes (Signed)
  Date: 01/18/2013  Patient name: Kathy Marshall  Medical record number: [id number]  Date of birth: March 23, 1935   This patient has been seen and the plan of care was discussed with the house staff. Please see their note for complete details. I concur with their findings with the following additions/corrections:  She is doing better from clinical standpoint today.  No hypoxia. Delirium is better, I agree likely a combination of infection and medication effects.  She is a moderate aspiration risk and MBS is recommended. She is currently on dysphagia 3 diet.  Continue IV abx for HCAP (also covering for aspiration PNA) until BCx negative for at least 48 hrs, afebrile for 24-48 hrs, and continued clinical improvement. Then, convert to PO abx to cover for HCAP and anaerobes to finish course. Ambulate before discharge to make sure she does not have an oxygen requirement with exertion.   Jonah Blue, DO 01/18/2013, 3:33 PM

## 2013-01-18 NOTE — Progress Notes (Signed)
I have seen the patient and reviewed the daily progress note by Barbara Cower Phillippi MS IV and discussed the care of the patient with them.  See below for documentation of my findings, assessment, and plans.  Subjective: Feels significantly better. No SOB, confusion, CP, nausea, vomiting.  Daughter offers history of frequent choking/coughing with history.   Objective: Vital signs in last 24 hours: Filed Vitals:   01/17/13 2102 01/18/13 0433 01/18/13 1117 01/18/13 1349  BP: 132/79 129/74 135/78 128/77  Pulse: 87 86 79 83  Temp: 98.5 F (36.9 C) 98.3 F (36.8 C)  98.3 F (36.8 C)  TempSrc: Oral Oral  Oral  Resp: 20 22  20   Height:      Weight:      SpO2: 92% 93%  92%   Weight change:   Intake/Output Summary (Last 24 hours) at 01/18/13 2040 Last data filed at 01/18/13 1300  Gross per 24 hour  Intake   1013 ml  Output      0 ml  Net   1013 ml   General: resting in bed, no acute distress Cardiac: RRR, no rubs, murmurs or gallops Pulm: coarse b/l breath sounds Abd: soft, nontender, nondistended, BS normoactive  Ext: warm and well perfused, no pedal edema Neuro: alert and oriented X3, cranial nerves II-XII grossly intact  Lab Results: Reviewed and documented in Electronic Record Micro Results: Reviewed and documented in Electronic Record Studies/Results: Reviewed and documented in Electronic Record  Medications: I have reviewed the patient's current medications. Scheduled Meds: . enoxaparin (LOVENOX) injection  20 mg Subcutaneous Q24H  . isosorbide mononitrate  60 mg Oral Daily  . losartan  50 mg Oral Daily  . pantoprazole  40 mg Oral BID  . piperacillin-tazobactam (ZOSYN)  IV  3.375 g Intravenous Q8H  . sodium chloride  3 mL Intravenous Q12H  . topiramate  25 mg Oral Daily  . traZODone  50 mg Oral QHS  . vancomycin  1,000 mg Intravenous Q24H   Continuous Infusions:  PRN Meds:.albuterol, ipratropium, morphine injection  Assessment/Plan: Kathy Marshall is a 78 yo F  with history of CAD, GERD, h/o GIB (diverticular), & gastric ulcer who was admitted on 01/16/13 with AMS.   #HCAP: afebrile; recent hospitalization in 11/2012; CXR reveals LLL PNA.  -Cont vanc/zosyn & await blood cx  -SLP to evaluate for aspiration risk --> Dys 3 with thin liquids & plans for SLP on 01/19/13 -d/c IVF -follow blood cx - NGTD   #Acute Encephalopathy: Resolved. Most likely secondary to infectious etiology compounded by multiple sedative medications. Cardiac etiology r/o with CE (-) x 3.  -Continue to hold home extended release narcotics & xanax   #HTN: BP mildly elevated, but stable  -Cont imdur, losartan   #COPD: Stable. -albuterol/atrovent   #Insomnia  -Trazodone qHS prn   #GERD: protonix   #Chronic low back pain: Home regimen includes MS contin 30mg  BID and percocet 10-325 5x/day prn  -Manage with morphine 2mg  IV q4 prn   #VTE ppx: lovenox   --> remainder per MSIV note   Dispo: Disposition is deferred at this time, awaiting improvement of current medical problems.  Anticipated discharge in approximately 1-2 day(s).   The patient does have a current PCP Kathy Brooks, MD) and does not need an [facility] hospital follow-up appointment after discharge.  The patient does not have transportation limitations that hinder transportation to clinic appointments.  .Services Needed at time of discharge: Y = Yes, Blank = No PT:   OT:  RN:   Equipment:   Other:     LOS: 2 days   Belia Heman, MD 01/18/2013, 8:40 PM

## 2013-01-18 NOTE — Progress Notes (Signed)
MBS was unable to be completed today. Will plan for MBS tomorrow. Harlon Ditty, MA CCC-SLP [phone]

## 2013-01-18 NOTE — Progress Notes (Signed)
Subjective: Patient alert and oriented. Not complaining of shortness of breath, chest pain, cough, nausea, vomiting.   Plan for modified barium swallow today.    Objective: Vital signs in last 24 hours: Filed Vitals:   01/17/13 2102 01/18/13 0433 01/18/13 1117 01/18/13 1349  BP: 132/79 129/74 135/78 128/77  Pulse: 87 86 79 83  Temp: 98.5 F (36.9 C) 98.3 F (36.8 C)  98.3 F (36.8 C)  TempSrc: Oral Oral  Oral  Resp: 20 22  20   Height:      Weight:      SpO2: 92% 93%  92%   Weight change:   Intake/Output Summary (Last 24 hours) at 01/18/13 1401 Last data filed at 01/18/13 1300  Gross per 24 hour  Intake   1088 ml  Output    200 ml  Net    888 ml   Exam: General: Alert and interactive. In no acute distress.  Lungs: Wheezing significantly improved. Crackles noted in the bases, somewhat worsened this morning. CV: S1/S2 RRR no r/m/g  Abdomen: Thin, NABS, soft, nondistended, nontender  Extremities: Poor skin turgor, skin is 'loose.' No edema, no cyanosis, no clubbing. 2+ distal pulses  Neuro/Psych: Alert and oriented to person, place, time and situation. Attention remarkably improved. Interactive, good eye contact, no delusional content, no AVH, does not appear to be responding to internal stimuli. CN grossly intact, no gross neurological deficits.  Lab Results: Basic Metabolic Panel:  Recent Labs Lab 01/17/13 0621 01/18/13 0620  NA 136 137  K 3.3* 2.7*  CL 104 103  CO2 18* 21  GLUCOSE 81 123*  BUN 9 8  CREATININE 0.42* 0.57  CALCIUM 8.6 9.1    Micro Results: Recent Results (from the past 240 hour(s))  URINE CULTURE     Status: None   Collection Time    01/16/13  6:34 AM      Result Value Range Status   Specimen Description URINE, CATHETERIZED   Final   Special Requests NONE   Final   Culture  Setup Time 01/16/2013 08:52   Final   Colony Count NO GROWTH   Final   Culture NO GROWTH   Final   Report Status 01/17/2013 FINAL   Final  CULTURE, BLOOD (ROUTINE  X 2)     Status: None   Collection Time    01/16/13  6:45 AM      Result Value Range Status   Specimen Description BLOOD LEFT ARM   Final   Special Requests BOTTLES DRAWN AEROBIC ONLY 5CC   Final   Culture  Setup Time 01/16/2013 12:54   Final   Culture     Final   Value:        BLOOD CULTURE RECEIVED NO GROWTH TO DATE CULTURE WILL BE HELD FOR 5 DAYS BEFORE ISSUING A FINAL NEGATIVE REPORT   Report Status PENDING   Incomplete  CULTURE, BLOOD (ROUTINE X 2)     Status: None   Collection Time    01/16/13  6:55 AM      Result Value Range Status   Specimen Description BLOOD RIGHT HAND   Final   Special Requests BOTTLES DRAWN AEROBIC ONLY 5CC   Final   Culture  Setup Time 01/16/2013 12:54   Final   Culture     Final   Value:        BLOOD CULTURE RECEIVED NO GROWTH TO DATE CULTURE WILL BE HELD FOR 5 DAYS BEFORE ISSUING A FINAL NEGATIVE REPORT   Report  Status PENDING   Incomplete    Medications: I have reviewed the patient's current medications. Scheduled Meds: . enoxaparin (LOVENOX) injection  20 mg Subcutaneous Q24H  . isosorbide mononitrate  60 mg Oral Daily  . losartan  50 mg Oral Daily  . pantoprazole  40 mg Oral BID  . piperacillin-tazobactam (ZOSYN)  IV  3.375 g Intravenous Q8H  . potassium chloride  40 mEq Oral Q4H  . sodium chloride  3 mL Intravenous Q12H  . topiramate  25 mg Oral Daily  . traZODone  50 mg Oral QHS  . vancomycin  1,000 mg Intravenous Q24H   Continuous Infusions:  PRN Meds:.albuterol, ipratropium, morphine injection Assessment/Plan: Principal Problem:   HCAP (healthcare-associated pneumonia) Active Problems:   HYPERCHOLESTEROLEMIA   HYPERTENSION, BENIGN ESSENTIAL   COPD   GERD   LOW BACK PAIN, CHRONIC   CAD (coronary artery disease), tandem Cyper DES to prox. and Mid RCA 2006, stable at cath 208   Acute encephalopathy  #Delirium: Cleared. Likely 2/2 narcotics, exacerbated by infection and dehydration -Holding home extended release narcotics, holding  home xanax  -trazadone 50 mg PO QHS for sleep  -patient willing to discuss weaning home narcotics to avoid delirium in the future  #HCAP: hospitalization in past 90 days. Also concern for aspiration in setting of recent delirium. -start IV vanc and zosyn, to cover HCAP and aspiration  -persistent crackles, d/c'ed fluids.   #AG metabolic acidosis: Bicarb 18, AG 14 to Bicarb 21, AG 13, presumably starvation ketosis. Improving with restarting diet.   #COPD- Wheezing resolved. Main issue appears to be pneumonia +/- aspiration. Reluctant to start steroids at this time.  -Albuterol, ipratropium nebs   #GERD: Protonix 40mg  BID, recent hx of GI bleed   #CAD: CEs -ive x3.   #HTN: Normotensive on losartan and imdur. Holding home metoprolol, restart at outpatient follow up.  #Chronic Pain: Taking MS contin 30mg  BID at home with additional oxycodone 10mg /325 5 times a day PRN.  -morphine 2mg  IV q4 PRN in hospital   #VTE ppx: Lovenox   Dispo: Pending further studies, likely tomorrrow or Sunday.  This is a Psychologist, occupational Note.  The care of the patient was discussed with Dr. Everardo Beals and the assessment and plan formulated with their assistance.  Please see their attached note for official documentation of the daily encounter.   LOS: 2 days   Salvatore Marvel, Med Student 01/18/2013, 2:01 PM

## 2013-01-18 NOTE — Progress Notes (Signed)
K+ 2.7 this am, paged Dr. Mariea Clonts.  Returned called and stated will notified upper level.  Will continue to monitor.  Forbes Cellar, RN

## 2013-01-19 ENCOUNTER — Inpatient Hospital Stay (HOSPITAL_COMMUNITY): Payer: Medicare Other

## 2013-01-19 LAB — BASIC METABOLIC PANEL
BUN: 8 mg/dL (ref 6–23)
GFR calc non Af Amer: 87 mL/min — ABNORMAL LOW (ref >90)
Glucose, Bld: 101 mg/dL — ABNORMAL HIGH (ref 70–99)
Potassium: 3.9 mEq/L (ref 3.5–5.1)

## 2013-01-19 MED ORDER — AMOXICILLIN-POT CLAVULANATE 875-125 MG PO TABS: 1.0000 | ORAL_TABLET | Freq: Two times a day (BID) | ORAL | Status: DC

## 2013-01-19 MED ORDER — LEVOFLOXACIN 750 MG PO TABS: 750.0000 mg | ORAL_TABLET | Freq: Every day | ORAL | Status: DC

## 2013-01-19 NOTE — Progress Notes (Signed)
Subjective: Feels better, no longer confused, breathing well with intermittent coughing.   Objective: Vital signs in last 24 hours: Filed Vitals:   01/18/13 1117 01/18/13 1349 01/18/13 2323 01/19/13 0526  BP: 135/78 128/77 122/72 172/95  Pulse: 79 83 75 75  Temp:  98.3 F (36.8 C) 98.6 F (37 C) 98.7 F (37.1 C)  TempSrc:  Oral Oral Oral  Resp:  20 18 20   Height:      Weight:      SpO2:  92% 94% 92%   Weight change:   Intake/Output Summary (Last 24 hours) at 01/19/13 1433 Last data filed at 01/19/13 0846  Gross per 24 hour  Intake    100 ml  Output      0 ml  Net    100 ml   General: resting in bed, no acute distress HEENT: PERRL, EOMI Cardiac: RRR, no rubs, murmurs or gallops Pulm: very soft b/l lower lobe coarse breath sounds, otherwise clear throughout  Abd: soft, nontender, nondistended, BS normoactive Ext: warm and well perfused, no pedal edema Neuro: alert and oriented X person, place, year, cranial nerves II-XII grossly intact  Lab Results: Basic Metabolic Panel:  Recent Labs Lab 01/18/13 0620 01/19/13 0533  NA 137 137  K 2.7* 3.9  CL 103 105  CO2 21 22  GLUCOSE 123* 101*  BUN 8 8  CREATININE 0.57 0.59  CALCIUM 9.1 9.3   CBC:  Recent Labs Lab 01/16/13 0658 01/17/13 1040  WBC 10.1 10.7*  NEUTROABS 8.6* 9.4*  HGB 13.8 13.0  HCT 40.7 37.1  MCV 94.7 91.4  PLT 216 191   Cardiac Enzymes:  Recent Labs Lab 01/16/13 1127 01/16/13 1748 01/17/13 0009  TROPONINI <0.30 <0.30 <0.30   CBG:  Recent Labs Lab 01/17/13 1219  GLUCAP 88    Micro Results: Recent Results (from the past 240 hour(s))  URINE CULTURE     Status: None   Collection Time    01/16/13  6:34 AM      Result Value Range Status   Specimen Description URINE, CATHETERIZED   Final   Special Requests NONE   Final   Culture  Setup Time 01/16/2013 08:52   Final   Colony Count NO GROWTH   Final   Culture NO GROWTH   Final   Report Status 01/17/2013 FINAL   Final  CULTURE,  BLOOD (ROUTINE X 2)     Status: None   Collection Time    01/16/13  6:45 AM      Result Value Range Status   Specimen Description BLOOD LEFT ARM   Final   Special Requests BOTTLES DRAWN AEROBIC ONLY 5CC   Final   Culture  Setup Time 01/16/2013 12:54   Final   Culture     Final   Value:        BLOOD CULTURE RECEIVED NO GROWTH TO DATE CULTURE WILL BE HELD FOR 5 DAYS BEFORE ISSUING A FINAL NEGATIVE REPORT   Report Status PENDING   Incomplete  CULTURE, BLOOD (ROUTINE X 2)     Status: None   Collection Time    01/16/13  6:55 AM      Result Value Range Status   Specimen Description BLOOD RIGHT HAND   Final   Special Requests BOTTLES DRAWN AEROBIC ONLY 5CC   Final   Culture  Setup Time 01/16/2013 12:54   Final   Culture     Final   Value:        BLOOD  CULTURE RECEIVED NO GROWTH TO DATE CULTURE WILL BE HELD FOR 5 DAYS BEFORE ISSUING A FINAL NEGATIVE REPORT   Report Status PENDING   Incomplete   Studies/Results: Dg Swallowing Func-speech Pathology  01/19/2013   Gray Bernhardt, CCC-SLP     01/19/2013  1:41 PM Objective Swallowing Evaluation: Modified Barium Swallowing Study   Patient Details  Name: Kathy Marshall MRN: [id number] Date of Birth: January 04, 1935  Today's Date: 01/19/2013 Time: 1030-1100 SLP Time Calculation (min): 30 min  Past Medical History:  Past Medical History  Diagnosis Date  . Low back pain   . Bronchitis   . Chronic hoarseness   . Sleep apnea, obstructive   . Renal artery stenosis   . PVD (peripheral vascular disease)   . Hypercholesteremia   . Pain in limb   . COPD (chronic obstructive pulmonary disease)   . Benign essential hypertension   . Atherosclerosis     coronary vessel type  . Coronary artery disease   . Diverticulosis   . History of GI diverticular bleed   . History of esophagitis   . H. pylori infection   . Altered mental status   . Pneumonia   . TIA (transient ischemic attack) 07/2010  . Arthritis   . GERD (gastroesophageal reflux disease)   . History of blood clots   .  Tubular adenoma 2012  . Heart murmur   . CHF (congestive heart failure)   . Anginal pain   . Myocardial infarct 1990's    "I've only had one" (12/06/2012)  . Exertional shortness of breath   . Daily headache   . Anxiety   . Gastric ulcer 07/2012    large; required clipping Hattie Perch 08/23/2012 (12/06/2012)   Past Surgical History:  Past Surgical History  Procedure Laterality Date  . Oophorectomy    . Coronary angioplasty with stent placement  2006    Cypher stent to RCA. On cath in 2008 stent described as widely  patent.   . Esophagogastroduodenoscopy N/A 08/21/2012    Procedure: ESOPHAGOGASTRODUODENOSCOPY (EGD);  Surgeon: Hart Carwin, MD;  Location: [facility] ENDOSCOPY;  Service: Endoscopy;   Laterality: N/A;  . Vaginal hysterectomy    . Esophagogastroduodenoscopy N/A 12/07/2012    Procedure: ESOPHAGOGASTRODUODENOSCOPY (EGD);  Surgeon: Iva Boop, MD;  Location: [facility] ENDOSCOPY;  Service: Endoscopy;   Laterality: N/A;   HPI:  78 year old female admitted 01/16/13 due to increased confusion,  and cough, with decreased LOC x4-5 days. PMH significant for  early dementia, COPD, esophagitis, GERD, PNA, TIA. CXR indicates  BLL airspace disease. BSE ordered to assess swallow. No overt s/s  aspiration, however, MBS recommended due to high risk indicators.      Assessment / Plan / Recommendation Clinical Impression  Dysphagia Diagnosis: Within Functional Limits Clinical impression: Normal oropharyngeal swallow function. No  oral deficits, no penetration or aspiration, no post-swallow  residual.  Recommend regular diet with thin liquids.    Treatment Recommendation  No treatment recommended at this time    Diet Recommendation Regular;Thin liquid   Liquid Administration via: Cup;Straw Medication Administration: Whole meds with liquid Supervision: Patient able to self feed Compensations: Slow rate;Small sips/bites Postural Changes and/or Swallow Maneuvers: Seated upright 90  degrees    Other  Recommendations Oral Care Recommendations: Oral  care BID                 Pertinent Vitals/Pain Back Pain    SLP Swallow Goals Swallow Study Goal #1 - Progress: Met  Swallow Study Goal #2 - Progress: Met   General Date of Onset: 01/16/13 HPI: 78 year old female admitted 01/16/13 due to increased  confusion, and cough, with decreased LOC x4-5 days. PMH  significant for early dementia, COPD, esophagitis, GERD, PNA,  TIA. CXR indicates BLL airspace disease. BSE ordered to assess  swallow. No overt s/s aspiration, however, MBS recommended due to  high risk indicators. Type of Study: Modified Barium Swallowing Study Reason for Referral: Objectively evaluate swallowing function Previous Swallow Assessment: BSE 01/17/13 Diet Prior to this Study: Dysphagia 3 (soft);Thin liquids Temperature Spikes Noted: No Respiratory Status: Room air History of Recent Intubation: No Behavior/Cognition: Alert;Cooperative;Pleasant mood Oral Cavity - Dentition: Dentures, bottom;Dentures, top Oral Motor / Sensory Function: Within functional limits Self-Feeding Abilities: Able to feed self Patient Positioning: Upright in chair Baseline Vocal Quality: Clear Volitional Cough: Strong Volitional Swallow: Able to elicit Anatomy: Within functional limits Pharyngeal Secretions: Not observed secondary MBS    Reason for Referral Objectively evaluate swallowing function   Oral Phase Oral Preparation/Oral Phase Oral Phase: WFL   Pharyngeal Phase Pharyngeal Phase Pharyngeal Phase: Within functional limits  Cervical Esophageal Phase    GO   Celia B. Murvin Natal [facility], CCC-SLP [phone] Cervical Esophageal Phase Cervical Esophageal Phase: [facility]         Leigh Aurora 01/19/2013, 1:40 PM    Medications: I have reviewed the patient's current medications. Scheduled Meds: . enoxaparin (LOVENOX) injection  20 mg Subcutaneous Q24H  . isosorbide mononitrate  60 mg Oral Daily  . losartan  50 mg Oral Daily  . pantoprazole  40 mg Oral BID  . piperacillin-tazobactam (ZOSYN)  IV  3.375 g Intravenous Q8H  .  sodium chloride  3 mL Intravenous Q12H  . topiramate  25 mg Oral Daily  . traZODone  50 mg Oral QHS  . vancomycin  1,000 mg Intravenous Q24H   Continuous Infusions:  PRN Meds:.albuterol, ipratropium, morphine injection  Assessment/Plan: Ms. Hirota is a 78 yo F with history of CAD, GERD, h/o GIB (diverticular), & gastric ulcer who was admitted on 01/16/13 with AMS.   #HCAP: afebrile; no growth on blood cx >48h; recent hospitalization in 11/2012; CXR reveals LLL PNA.  -Change abx to levaquin & augmentin and d/c home (coverage for HCAP and aspiration, levaquin for 11 additional days, augmentin for 5 additional days) -SLP recommends regular diet with thin liquids, but this does not rule out aspiration if patient was altered due to medications -follow blood cx - NGTD   #Acute Encephalopathy: Resolved. Most likely secondary to infectious etiology compounded by multiple sedative medications. Cardiac etiology r/o with CE (-) x 3.  -d/c xanax & flexiril at discharge  #HTN: BP mildly elevated -Cont imdur, losartan  -restart metoprolol at discharge, follow up with PCP in 1 week  #COPD: Stable.  -albuterol/atrovent   #Insomnia  -Trazodone qHS prn; this may take time to resolve without xanax, but she notes trazodone was not helpful; may consider geriatric depression   #GERD: protonix   #Chronic low back pain: Home regimen includes MS contin 30mg  BID and percocet 10-325 5x/day prn --> restart home regimen at discharge but strongly encouraged patient to discuss decreasing medication with back MD -Manage with morphine 2mg  IV q4 prn during hospitalization (daily requirement has been about 6mg  morphine)  #VTE ppx: lovenox    Dispo: Disposition is deferred at this time, awaiting improvement of current medical problems.  Anticipated discharge today  The patient does have a current PCP Donita Brooks,  MD) and does not need an [facility] hospital follow-up appointment after  discharge.    .Services Needed at time of discharge: Y = Yes, Blank = No PT:   OT:   RN:   Equipment:   Other:     LOS: 3 days   Belia Heman, MD 01/19/2013, 2:33 PM

## 2013-01-19 NOTE — Progress Notes (Signed)
NURSING PROGRESS NOTE  Kathy Marshall [id number] Discharge Data: 01/19/2013 4:34 PM Attending Provider: Jonah Blue, DO WUJ:[id number],WGNFAO TOM, MD     Marnee Spring Seever to be D/C'd Home per MD order.  Discussed with the patient the After Visit Summary and all questions fully answered. All IV's discontinued with no bleeding noted. All belongings returned to patient for patient to take home.   Last Vital Signs:  Blood pressure 172/95, pulse 75, temperature 98.7 F (37.1 C), temperature source Oral, resp. rate 20, height 5\' 2"  (1.575 m), weight 46.4 kg (102 lb 4.7 oz), SpO2 92.00%.  Discharge Medication List   Medication List    STOP taking these medications       ALPRAZolam 0.5 MG tablet  Commonly known as:  XANAX     cyclobenzaprine 5 MG tablet  Commonly known as:  FLEXERIL     doxycycline 100 MG tablet  Commonly known as:  VIBRA-TABS     predniSONE 10 MG tablet  Commonly known as:  DELTASONE      TAKE these medications       acetaminophen 500 MG tablet  Commonly known as:  TYLENOL  Take 1,000 mg by mouth every 4 (four) hours as needed (For headache).     albuterol 108 (90 BASE) MCG/ACT inhaler  Commonly known as:  PROAIR HFA  Inhale 2 puffs into the lungs every 4 (four) hours as needed for wheezing.     amoxicillin-clavulanate 875-125 MG per tablet  Commonly known as:  AUGMENTIN  Take 1 tablet by mouth 2 (two) times daily. One po bid x 5 days     atorvastatin 80 MG tablet  Commonly known as:  LIPITOR  Take 80 mg by mouth at bedtime.     clopidogrel 75 MG tablet  Commonly known as:  PLAVIX  Take 1 tablet (75 mg total) by mouth daily.     COMPLETE PROTEIN/VITAMIN SHAKE PO  Take 2-3 Bottles by mouth daily.     ferrous sulfate 325 (65 FE) MG tablet  Take 325 mg by mouth daily with breakfast.     gabapentin 300 MG capsule  Commonly known as:  NEURONTIN  Take 300 mg by mouth at bedtime.     isosorbide mononitrate 60 MG 24 hr tablet  Commonly known  as:  IMDUR  Take 1 tablet (60 mg total) by mouth daily.     levofloxacin 750 MG tablet  Commonly known as:  LEVAQUIN  Take 1 tablet (750 mg total) by mouth daily. X 11 days     losartan 50 MG tablet  Commonly known as:  COZAAR  Take 50 mg by mouth daily.     metoprolol tartrate 25 MG tablet  Commonly known as:  LOPRESSOR  Take 0.5 tablets (12.5 mg total) by mouth 2 (two) times daily.     morphine 30 MG 12 hr tablet  Commonly known as:  MS CONTIN  Take 30 mg by mouth. Every 12 hours as needed for pain     ondansetron 4 MG tablet  Commonly known as:  ZOFRAN  Take 4 mg by mouth every 8 (eight) hours as needed for nausea.     oxyCODONE-acetaminophen 10-325 MG per tablet  Commonly known as:  PERCOCET  Take 1 tablet by mouth 5 (five) times daily as needed for pain.     pantoprazole 40 MG tablet  Commonly known as:  PROTONIX  Take 40 mg by mouth 2 (two) times daily.     topiramate 25  MG tablet  Commonly known as:  TOPAMAX  Take 25 mg by mouth daily.

## 2013-01-19 NOTE — Progress Notes (Signed)
Patient refused Home Health. She says her daughter lives with her and helps with her daily needs.

## 2013-01-19 NOTE — Discharge Summary (Signed)
Name: Kathy Marshall MRN: [id number] DOB: Oct 21, 1934 78 y.o. PCP: Donita Brooks, MD  Date of Admission: 01/16/2013  6:17 AM Date of Discharge: 01/19/2013 Attending Physician: Jonah Blue, DO  Discharge Diagnosis: Principal Problem:   HCAP (healthcare-associated pneumonia) vs Aspiration PNA Active Problems:   HYPERCHOLESTEROLEMIA   HYPERTENSION, BENIGN ESSENTIAL   COPD   GERD   LOW BACK PAIN, CHRONIC   CAD (coronary artery disease), tandem Cyper DES to prox. and Mid RCA 2006, stable at cath 208   Acute encephalopathy  Discharge Medications:   Medication List    STOP taking these medications       ALPRAZolam 0.5 MG tablet  Commonly known as:  XANAX     cyclobenzaprine 5 MG tablet  Commonly known as:  FLEXERIL     doxycycline 100 MG tablet  Commonly known as:  VIBRA-TABS     predniSONE 10 MG tablet  Commonly known as:  DELTASONE      TAKE these medications       acetaminophen 500 MG tablet  Commonly known as:  TYLENOL  Take 1,000 mg by mouth every 4 (four) hours as needed (For headache).     albuterol 108 (90 BASE) MCG/ACT inhaler  Commonly known as:  PROAIR HFA  Inhale 2 puffs into the lungs every 4 (four) hours as needed for wheezing.     amoxicillin-clavulanate 875-125 MG per tablet  Commonly known as:  AUGMENTIN  Take 1 tablet by mouth 2 (two) times daily. One po bid x 5 days     atorvastatin 80 MG tablet  Commonly known as:  LIPITOR  Take 80 mg by mouth at bedtime.     clopidogrel 75 MG tablet  Commonly known as:  PLAVIX  Take 1 tablet (75 mg total) by mouth daily.     COMPLETE PROTEIN/VITAMIN SHAKE PO  Take 2-3 Bottles by mouth daily.     ferrous sulfate 325 (65 FE) MG tablet  Take 325 mg by mouth daily with breakfast.     gabapentin 300 MG capsule  Commonly known as:  NEURONTIN  Take 300 mg by mouth at bedtime.     isosorbide mononitrate 60 MG 24 hr tablet  Commonly known as:  IMDUR  Take 1 tablet (60 mg total) by mouth daily.       levofloxacin 750 MG tablet  Commonly known as:  LEVAQUIN  Take 1 tablet (750 mg total) by mouth daily. X 11 days     losartan 50 MG tablet  Commonly known as:  COZAAR  Take 50 mg by mouth daily.     metoprolol tartrate 25 MG tablet  Commonly known as:  LOPRESSOR  Take 0.5 tablets (12.5 mg total) by mouth 2 (two) times daily.     morphine 30 MG 12 hr tablet  Commonly known as:  MS CONTIN  Take 30 mg by mouth. Every 12 hours as needed for pain     ondansetron 4 MG tablet  Commonly known as:  ZOFRAN  Take 4 mg by mouth every 8 (eight) hours as needed for nausea.     oxyCODONE-acetaminophen 10-325 MG per tablet  Commonly known as:  PERCOCET  Take 1 tablet by mouth 5 (five) times daily as needed for pain.     pantoprazole 40 MG tablet  Commonly known as:  PROTONIX  Take 40 mg by mouth 2 (two) times daily.     topiramate 25 MG tablet  Commonly known as:  TOPAMAX  Take 25  mg by mouth daily.        Disposition and follow-up:   KathyAmilliana H Marshall was discharged from [facility] in Good condition.  At the hospital follow up visit please address:  1.  Decrease use of narcotic/sedating medications  2.  Labs / imaging needed at time of follow-up: repeat CXR in 6 weeks  Follow-up Appointments:     Follow-up Information   Follow up with [facility] TOM, MD. Schedule an appointment as soon as possible for a visit in 1 week. (for hospital follow up, you will need a chest x-ray repeated in 6 weeks.)    Contact information:   [address] [place] [address] [phone]       Discharge Instructions: Discharge Orders   Future Appointments Provider Department Dept Phone   01/23/2013 9:30 AM [facility] [address] [facility] [address] [phone]   Future Orders Complete By Expires     Call MD for:  difficulty breathing, headache or visual disturbances  As directed     Call MD for:  persistant nausea and  vomiting  As directed     Call MD for:  severe uncontrolled pain  As directed     Call MD for:  temperature >100.4  As directed     Diet - low sodium heart healthy  As directed     Discharge instructions  As directed     Comments:      You were treated for pneumonia that was either hospital acquired (since you were hospitalized in June) or due to aspiration (you breathed in your food).  We are going to treat you with Augmentin twice a day for the next 5 days, and then Levaquin 750mg  daily for the next 11 days.  You have already gotten your antibiotics for 01/19/13, so you can start these medications on 01/20/13. -I am recommending that you discontinue some medications that can cause you to feel sleepy and confused, including xanax and flexiril.  I also suggest you talk to your daughter about decreasing your MS contin dose.  Even though you have worked up to 30mg  twice daily of the pain medication, you have been losing weight, so you will require less medication.    Increase activity slowly  As directed        Consultations:  None  Procedures Performed:  Dg Chest 2 View 01/17/2013   *RADIOLOGY REPORT*  Clinical Data: Cough.  CHEST - 2 VIEW  Comparison: 01/16/2013 and 11/21/2012.  Findings: The heart size and mediastinal contours are stable with aortic atherosclerosis.  There is increased patchy opacity at the left lung base.  Previously demonstrated right basilar air space disease has resolved.  Small bilateral pleural effusions are noted. No acute osseous findings are seen.  IMPRESSION: New left lower lobe air space disease suspicious for pneumonia. Small bilateral pleural effusions.   Original Report Authenticated By: Carey Bullocks, M.D.   Dg Chest Port 1 View 01/16/2013   *RADIOLOGY REPORT*  Clinical Data: Altered mental status.  Fever 103.  PORTABLE CHEST - 1 VIEW  Comparison: 11/21/2012  Findings: Cardiac enlargement.  Pulmonary vascularity is normal. There is evidence of airspace disease in the  right lung base suggesting pneumonia.  This is developing since the previous study. No blunting of costophrenic angles.  No pneumothorax.  Calcified, tortuous, and ectatic aorta.  IMPRESSION: Developing airspace disease in the right lung base suggesting pneumonia.   Original Report Authenticated By: Burman Nieves, M.D.  Dg Swallowing Func-speech Pathology 01/19/2013   Assessment / Plan / Recommendation Clinical Impression  Dysphagia Diagnosis: Within Functional Limits Clinical impression: Normal oropharyngeal swallow function. No  oral deficits, no penetration or aspiration, no post-swallow  residual.  Recommend regular diet with thin liquids.    Treatment Recommendation  No treatment recommended at this time    Diet Recommendation Regular;Thin liquid   Liquid Administration via: Cup;Straw Medication Administration: Whole meds with liquid Supervision: Patient able to self feed Compensations: Slow rate;Small sips/bites Postural Changes and/or Swallow Maneuvers: Seated upright 90  degrees    Other  Recommendations Oral Care Recommendations: Oral care BID                 Pertinent Vitals/Pain Back Pain    SLP Swallow Goals Swallow Study Goal #1 - Progress: Met Swallow Study Goal #2 - Progress: Met   General Date of Onset: 01/16/13 HPI: 78 year old female admitted 01/16/13 due to increased  confusion, and cough, with decreased LOC x4-5 days. PMH  significant for early dementia, COPD, esophagitis, GERD, PNA,  TIA. CXR indicates BLL airspace disease. BSE ordered to assess  swallow. No overt s/s aspiration, however, MBS recommended due to  high risk indicators. Type of Study: Modified Barium Swallowing Study Reason for Referral: Objectively evaluate swallowing function Previous Swallow Assessment: BSE 01/17/13 Diet Prior to this Study: Dysphagia 3 (soft);Thin liquids Temperature Spikes Noted: No Respiratory Status: Room air History of Recent Intubation: No Behavior/Cognition: Alert;Cooperative;Pleasant mood Oral Cavity  - Dentition: Dentures, bottom;Dentures, top Oral Motor / Sensory Function: Within functional limits Self-Feeding Abilities: Able to feed self Patient Positioning: Upright in chair Baseline Vocal Quality: Clear Volitional Cough: Strong Volitional Swallow: Able to elicit Anatomy: Within functional limits Pharyngeal Secretions: Not observed secondary MBS    Reason for Referral Objectively evaluate swallowing function   Oral Phase Oral Preparation/Oral Phase Oral Phase: WFL   Pharyngeal Phase Pharyngeal Phase Pharyngeal Phase: Within functional limits  Cervical Esophageal Phase    GO   Celia B. Murvin [facility], CCC-SLP [phone] Cervical Esophageal Phase Cervical Esophageal Phase: [facility]         Leigh Aurora 01/19/2013, 1:40 PM     Admission HPI:  The patient is a 78 YO female who comes in today with her family for confusion and decreased level of consciousness. She was having some cough for the last 4-5 days. She was seen by her doctor 2 days ago and diagnosed with bronchitis and started on doxycycline and prednisone. Her daughter helps to provide the history and states that her mother was confused starting last night at 1230 AM. She states that her mother has had episodes of confusion before however they lasted only about 20-30 minutes at most. She was admitted for GI bleed in June and the daughter states that at that time they were told she may have early dementia. Her other PMH includes COPD, CAD, HTN, chronic back pain. Her daughter does not state that her mother has had any fevers at home. She denies that her mother has been throwing up or feeling nauseous lately. She has not had diarrhea. She denies any pain currently including chest pain, abdominal pain. Her daughter mentions that she has chronic headache but it has not been different lately and she does have some back and leg pain from some degeneration. Her daughter mentions that her doctor thinks she needs a back surgery however they have not  gotten around to this as of yet. None of her medications  have changed recently although she does manage her own medications and adamantly refuses to let her family help her with medications. Her daughter suspects that she may not always take them as instructed. She is still smoking about 1 PPD however does not currently use alcohol.   Admission Physical Exam:  Blood pressure 131/64, pulse 87, temperature 103.4 F (39.7 C), temperature source Rectal, resp. rate 23, SpO2 95.00%.  General: resting in bed, intermittently grabbing at the sheets, mumbling throughout our interview, very thin  HEENT: PERRL, EOMI, no scleral icterus  Cardiac: S1 S2 normal, no murmurs, no JVD  Pulm: clear to auscultation bilaterally, moving normal volumes of air  Abd: soft, nontender, nondistended, BS present  Ext: warm and well perfused, no pedal edema  Neuro: knows her name and is intermittently confused about where she is and is not oriented to year, she is moving all four extremities and is able to follow basic commands   AdmissionLab results:  Basic Metabolic Panel:   01/16/13 0658   NA  139   K  3.5   CL  106   CO2  20   GLUCOSE  114*   BUN  18   CREATININE  0.62   CALCIUM  9.1    Liver Function Tests:   01/16/13 0658   AST  23   ALT  12   ALKPHOS  91   BILITOT  0.3   PROT  6.8   ALBUMIN  2.9*    CBC:    01/16/13 0658   WBC  10.1   NEUTROABS  8.6*   HGB  13.8   HCT  40.7   MCV  94.7   PLT  216    Urinalysis:   01/16/13 0634   COLORURINE  YELLOW   LABSPEC  1.024   PHURINE  7.0   GLUCOSEU  NEGATIVE   HGBUR  NEGATIVE   BILIRUBINUR  SMALL*   KETONESUR  NEGATIVE   PROTEINUR  NEGATIVE   UROBILINOGEN  2.0*   NITRITE  NEGATIVE   LEUKOCYTESUR  NEGATIVE     Hospital Course by problem list: Ms. Dust is a 78 yo F with history of CAD, GERD, h/o GIB (diverticular), & gastric ulcer who was admitted on 01/16/13 with AMS.  #Delirium: Patient presented with acutely altered mental  status, with waxing and waning of attention, hallucinations, disorientation and transient delusions. According to the daughter, the patient has had episodes of altered mental status in the past, but never lasting more than a few hours. The patient's age and a possible history of early dementia are predisposing factors. Potential precipitating factors include the patient's extensive use of opioid narcotics, as well as acute infection and dehydration. The patient was treated empirically for pneumonia (HCAP), fluid resuscitated, and her home narcotic regimen was reduced to Morphine IV 2mg  q4 hours PRN. The patient's mental status showed significant improvement through the first 24 hours of admission, and her delirium was cleared after 48 hours of admission. At the time of discharge, the patient was oriented to person, place, time, and situation. Recommended to patient that she discuss opioid dose reduction with her Pain Specialist. Also recommend a baseline MMSE or MOCA be obtained in outpatient follow up with her PCP.  #HCAP: Subsegmental left lower lobe infiltrate noted on CXR following fluid resuscitation. Given hospitalization within the past 90 days, the patient was treated empirically for HCAP, however aspiration was also consider a possibility given the patient's altered mental status. Bedside swallow evaluation  as well as modified barium swallow performed after resolution of delirium did not demonstrate aspiration. At discharge, patient had been afebrile and oxygenating well for more than 48 hours, and she was discharged on an oral regimen of levofloxacin (14d total) and augmentin (5d, totaling 8d of anaerobe coverage).  #COPD- Patient with known history of COPD. At presentation, some wheezing was noted on physical exam. Albuterol and ipratropium nebs were given which led to complete resolution of wheezing. Given the presentation which was highly consistent with an acute pneumonia rather than a COPD  exacerbation, it was felt that steroids were not indicated. At discharge, the patient was asymptomatic, without wheezing, and oxygenating well on room air. Recommend resumption of home regimen without changes.  #Chronic Pain: Taking MS contin 30mg  BID at home with additional oxycodone 10mg /325 5 times a day PRN. Reportedly the patient was taking all of her available PRN doses at home, and this was felt to be an important contributing factor to her presentation with delirium. Patient received morphine 2mg  IV q4 PRN in hospital, and was documented to receive roughly 6mg  of IV morphine per day, equivalent to the 60 mg of morphine she was taking for her basal dose. Recommended patient attempt to reduce her use of PRN oxycodone and follow up with her pain specialist regarding weaning from these medications.  #GERD: Continued home Protonix 40mg  BID given recent history of GI bleed. The patient did not complain of abdominal pain or reflux during the admission. No signs or symptoms of bleeding were observed.  #CAD: Given patient's history of coronary artery disease and acute presentation of dyspnea and altered mental status, ruled out acute coronary syndrome. Tropinins x3 were obtained and they were all negative.   #HTN: Given patient's initial presentation of dehydration, elected to hold antihypertensive medications until patient was volume resuscitated. However, hypertension was noted following normalization of volume status, home Losartan and Imdur were restarted and the patient was normotensive at discharge.   Discharge Vitals:   BP 172/95  Pulse 75  Temp(Src) 98.7 F (37.1 C) (Oral)  Resp 20  Ht 5\' 2"  (1.575 m)  Wt 102 lb 4.7 oz (46.4 kg)  BMI 18.7 kg/m2  SpO2 92%  Discharge Labs:  Results for orders placed during the hospital encounter of 01/16/13 (from the past 24 hour(s))  BASIC METABOLIC PANEL     Status: Abnormal   Collection Time    01/19/13  5:33 AM      Result Value Range   Sodium 137   135 - 145 mEq/L   Potassium 3.9  3.5 - 5.1 mEq/L   Chloride 105  96 - 112 mEq/L   CO2 22  19 - 32 mEq/L   Glucose, Bld 101 (*) 70 - 99 mg/dL   BUN 8  6 - 23 mg/dL   Creatinine, Ser 1.61  0.50 - 1.10 mg/dL   Calcium 9.3  8.4 - 09.6 mg/dL   GFR calc non Af Amer 87 (*) >90 mL/min   GFR calc Af Amer >90  >90 mL/min    Signed: Belia Heman, MD 01/19/2013, 2:40 PM   Time Spent on Discharge: 35 minutes Services Ordered on Discharge: HHRN for medication mgmt assistance

## 2013-01-19 NOTE — Procedures (Signed)
Objective Swallowing Evaluation: Modified Barium Swallowing Study  Patient Details  Name: Kathy Marshall MRN: [id number] Date of Birth: June 16, 1935  Today's Date: 01/19/2013 Time: 1030-1100 SLP Time Calculation (min): 30 min  Past Medical History:  Past Medical History  Diagnosis Date  . Low back pain   . Bronchitis   . Chronic hoarseness   . Sleep apnea, obstructive   . Renal artery stenosis   . PVD (peripheral vascular disease)   . Hypercholesteremia   . Pain in limb   . COPD (chronic obstructive pulmonary disease)   . Benign essential hypertension   . Atherosclerosis     coronary vessel type  . Coronary artery disease   . Diverticulosis   . History of GI diverticular bleed   . History of esophagitis   . H. pylori infection   . Altered mental status   . Pneumonia   . TIA (transient ischemic attack) 07/2010  . Arthritis   . GERD (gastroesophageal reflux disease)   . History of blood clots   . Tubular adenoma 2012  . Heart murmur   . CHF (congestive heart failure)   . Anginal pain   . Myocardial infarct 1990's    "I've only had one" (12/06/2012)  . Exertional shortness of breath   . Daily headache   . Anxiety   . Gastric ulcer 07/2012    large; required clipping Hattie Perch 08/23/2012 (12/06/2012)   Past Surgical History:  Past Surgical History  Procedure Laterality Date  . Oophorectomy    . Coronary angioplasty with stent placement  2006    Cypher stent to RCA. On cath in 2008 stent described as widely patent.   . Esophagogastroduodenoscopy N/A 08/21/2012    Procedure: ESOPHAGOGASTRODUODENOSCOPY (EGD);  Surgeon: Hart Carwin, MD;  Location: [facility] ENDOSCOPY;  Service: Endoscopy;  Laterality: N/A;  . Vaginal hysterectomy    . Esophagogastroduodenoscopy N/A 12/07/2012    Procedure: ESOPHAGOGASTRODUODENOSCOPY (EGD);  Surgeon: Iva Boop, MD;  Location: [facility] ENDOSCOPY;  Service: Endoscopy;  Laterality: N/A;   HPI:  78 year old female admitted 01/16/13 due to increased  confusion, and cough, with decreased LOC x4-5 days. PMH significant for early dementia, COPD, esophagitis, GERD, PNA, TIA. CXR indicates BLL airspace disease. BSE ordered to assess swallow. No overt s/s aspiration, however, MBS recommended due to high risk indicators.     Assessment / Plan / Recommendation Clinical Impression  Dysphagia Diagnosis: Within Functional Limits Clinical impression: Normal oropharyngeal swallow function. No oral deficits, no penetration or aspiration, no post-swallow residual.  Recommend regular diet with thin liquids.    Treatment Recommendation  No treatment recommended at this time    Diet Recommendation Regular;Thin liquid   Liquid Administration via: Cup;Straw Medication Administration: Whole meds with liquid Supervision: Patient able to self feed Compensations: Slow rate;Small sips/bites Postural Changes and/or Swallow Maneuvers: Seated upright 90 degrees    Other  Recommendations Oral Care Recommendations: Oral care BID                 Pertinent Vitals/Pain Back Pain    SLP Swallow Goals Swallow Study Goal #1 - Progress: Met Swallow Study Goal #2 - Progress: Met   General Date of Onset: 01/16/13 HPI: 78 year old female admitted 01/16/13 due to increased confusion, and cough, with decreased LOC x4-5 days. PMH significant for early dementia, COPD, esophagitis, GERD, PNA, TIA. CXR indicates BLL airspace disease. BSE ordered to assess swallow. No overt s/s aspiration, however, MBS recommended due to high risk indicators. Type  of Study: Modified Barium Swallowing Study Reason for Referral: Objectively evaluate swallowing function Previous Swallow Assessment: BSE 01/17/13 Diet Prior to this Study: Dysphagia 3 (soft);Thin liquids Temperature Spikes Noted: No Respiratory Status: Room air History of Recent Intubation: No Behavior/Cognition: Alert;Cooperative;Pleasant mood Oral Cavity - Dentition: Dentures, bottom;Dentures, top Oral Motor / Sensory  Function: Within functional limits Self-Feeding Abilities: Able to feed self Patient Positioning: Upright in chair Baseline Vocal Quality: Clear Volitional Cough: Strong Volitional Swallow: Able to elicit Anatomy: Within functional limits Pharyngeal Secretions: Not observed secondary MBS    Reason for Referral Objectively evaluate swallowing function   Oral Phase Oral Preparation/Oral Phase Oral Phase: WFL   Pharyngeal Phase Pharyngeal Phase Pharyngeal Phase: Within functional limits  Cervical Esophageal Phase    GO   Celia B. Murvin Natal [facility], CCC-SLP [phone] Cervical Esophageal Phase Cervical Esophageal Phase: [facility]         Leigh Aurora 01/19/2013, 1:40 PM

## 2013-01-21 ENCOUNTER — Other Ambulatory Visit: Payer: Self-pay | Admitting: Family Medicine

## 2013-01-21 NOTE — Progress Notes (Signed)
   CARE MANAGEMENT NOTE 01/21/2013  Patient:  Kathy Marshall, Kathy Marshall   Account Number:  [id number]  Date Initiated:  01/21/2013  Documentation initiated by:  [facility]  Subjective/Objective Assessment:   HCAP     Action/Plan:   Anticipated DC Date:  01/21/2013   Anticipated DC Plan:  HOME W HOME HEALTH SERVICES      DC Planning Services  CM consult      [facility] Choice  HOME HEALTH   Choice offered to / List presented to:  C-4 Adult Children        HH arranged  HH-1 RN      [facility] agency  [facility]   Status of service:  Completed, signed off Medicare Important Message given?   (If response is "NO", the following Medicare IM given date fields will be blank) Date Medicare IM given:   Date Additional Medicare IM given:    Discharge Disposition:  HOME W HOME HEALTH SERVICES  Per UR Regulation:    If discussed at Long Length of Stay Meetings, dates discussed:    Comments:  01/21/2013 1145 NCM spoke to pt's caregiver, dtr, [name]. States she provides 24 hour care for her mother. States she did discuss mother [facility] RN and she is agreeable. NCM offered choice for [facility] and dtr agreeable to [facility]. Referral sent to [facility] for [facility] RN. Isidoro Donning RN CCM Case Mgmt phone [phone]

## 2013-01-21 NOTE — Telephone Encounter (Signed)
?   OK to Refill  

## 2013-01-22 LAB — CULTURE, BLOOD (ROUTINE X 2): Culture: NO GROWTH

## 2013-01-22 NOTE — Telephone Encounter (Signed)
ok 

## 2013-01-22 NOTE — Telephone Encounter (Signed)
Medication refilled per protocol. 

## 2013-01-23 ENCOUNTER — Encounter (HOSPITAL_COMMUNITY): Payer: Medicare Other

## 2013-01-23 DIAGNOSIS — J189 Pneumonia, unspecified organism: Secondary | ICD-10-CM

## 2013-01-23 DIAGNOSIS — G8929 Other chronic pain: Secondary | ICD-10-CM

## 2013-01-23 DIAGNOSIS — M545 Low back pain: Secondary | ICD-10-CM

## 2013-01-23 DIAGNOSIS — J441 Chronic obstructive pulmonary disease with (acute) exacerbation: Secondary | ICD-10-CM

## 2013-01-23 NOTE — Discharge Summary (Signed)
  Date: 01/23/2013  Patient name: Kathy Marshall  Medical record number: [id number]  Date of birth: April 06, 1935   This patient has been discussed with the house staff. Please see their note for complete details. I concur with their findings and plan. Jonah Blue, DO 01/23/2013, 3:04 PM

## 2013-01-25 ENCOUNTER — Encounter: Payer: Self-pay | Admitting: Family Medicine

## 2013-01-25 ENCOUNTER — Ambulatory Visit (INDEPENDENT_AMBULATORY_CARE_PROVIDER_SITE_OTHER): Payer: 59 | Admitting: Family Medicine

## 2013-01-25 ENCOUNTER — Other Ambulatory Visit: Payer: Self-pay | Admitting: Family Medicine

## 2013-01-25 VITALS — BP 136/80 | HR 78 | Temp 98.1°F | Resp 14 | Wt 93.0 lb

## 2013-01-25 DIAGNOSIS — J189 Pneumonia, unspecified organism: Secondary | ICD-10-CM

## 2013-01-25 DIAGNOSIS — R197 Diarrhea, unspecified: Secondary | ICD-10-CM

## 2013-01-25 MED ORDER — LEVOFLOXACIN 750 MG PO TABS: 750.0000 mg | ORAL_TABLET | Freq: Every day | ORAL | Status: DC

## 2013-01-25 NOTE — Progress Notes (Signed)
Subjective:    Patient ID: Kathy Marshall, female    DOB: 06-Jun-1935, 78 y.o.   MRN: [id number]  HPI Patient was recently admitted with pneumonia. There was some debate about whether this was a aspiration pneumonia versus healthcare acquired pneumonia versus a community-acquired pneumonia. She was discharged home on Augmentin and Levaquin. She completed the Augmentin. She never took the Levaquin. She continues to have a slight cough and left basilar crackles. She is also having severe diarrhea and fecal incontinence at night. She denies fever or abdominal pain. She is requesting for a refill on her Xanax. Hospital admission was significant for altered mental status. She is on numerous medications that are sedating including MS Contin, Percocet, and Xanax. MS Contin and Percocet prescribed to orthopedics. We are prescribing her Xanax. At her last office visit, we discussed weaning down on Xanax. She is here today with her daughter. Her daughter is now all living with the patient and supervising all of her healthcare.  She is administering her medicines. The daughter states that she is in agreement that the patient is taking too many medications and she is willing to try to help wean her off the Xanax. She states that her mother does occasionally become extremely agitated and tearful and does require Xanax on occasion. Past Medical History  Diagnosis Date  . Low back pain   . Bronchitis   . Chronic hoarseness   . Sleep apnea, obstructive   . Renal artery stenosis   . PVD (peripheral vascular disease)   . Hypercholesteremia   . Pain in limb   . COPD (chronic obstructive pulmonary disease)   . Benign essential hypertension   . Atherosclerosis     coronary vessel type  . Coronary artery disease   . Diverticulosis   . History of GI diverticular bleed   . History of esophagitis   . H. pylori infection   . Altered mental status   . Pneumonia   . TIA (transient ischemic attack) 07/2010  .  Arthritis   . GERD (gastroesophageal reflux disease)   . History of blood clots   . Tubular adenoma 2012  . Heart murmur   . CHF (congestive heart failure)   . Anginal pain   . Myocardial infarct 1990's    "I've only had one" (12/06/2012)  . Exertional shortness of breath   . Daily headache   . Anxiety   . Gastric ulcer 07/2012    large; required clipping Hattie Perch 08/23/2012 (12/06/2012)   Past Surgical History  Procedure Laterality Date  . Oophorectomy    . Coronary angioplasty with stent placement  2006    Cypher stent to RCA. On cath in 2008 stent described as widely patent.   . Esophagogastroduodenoscopy N/A 08/21/2012    Procedure: ESOPHAGOGASTRODUODENOSCOPY (EGD);  Surgeon: Hart Carwin, MD;  Location: [facility] ENDOSCOPY;  Service: Endoscopy;  Laterality: N/A;  . Vaginal hysterectomy    . Esophagogastroduodenoscopy N/A 12/07/2012    Procedure: ESOPHAGOGASTRODUODENOSCOPY (EGD);  Surgeon: Iva Boop, MD;  Location: [facility] ENDOSCOPY;  Service: Endoscopy;  Laterality: N/A;   Current Outpatient Prescriptions on File Prior to Visit  Medication Sig Dispense Refill  . acetaminophen (TYLENOL) 500 MG tablet Take 1,000 mg by mouth every 4 (four) hours as needed (For headache).      Marland Kitchen albuterol (PROAIR HFA) 108 (90 BASE) MCG/ACT inhaler Inhale 2 puffs into the lungs every 4 (four) hours as needed for wheezing.  1 Inhaler  6  . amoxicillin-clavulanate (AUGMENTIN) 875-125  MG per tablet Take 1 tablet by mouth 2 (two) times daily. One po bid x 5 days  10 tablet  0  . atorvastatin (LIPITOR) 80 MG tablet Take 80 mg by mouth at bedtime.      . clopidogrel (PLAVIX) 75 MG tablet Take 1 tablet (75 mg total) by mouth daily.  90 tablet  3  . ferrous sulfate 325 (65 FE) MG tablet Take 325 mg by mouth daily with breakfast.      . gabapentin (NEURONTIN) 300 MG capsule Take 300 mg by mouth at bedtime.      . isosorbide mononitrate (IMDUR) 60 MG 24 hr tablet Take 1 tablet (60 mg total) by mouth daily.  30 tablet  6   . losartan (COZAAR) 50 MG tablet Take 50 mg by mouth daily.      . metoprolol tartrate (LOPRESSOR) 25 MG tablet Take 0.5 tablets (12.5 mg total) by mouth 2 (two) times daily.      Marland Kitchen morphine (MS CONTIN) 30 MG 12 hr tablet Take 30 mg by mouth. Every 12 hours as needed for pain      . Nutritional Supplements (COMPLETE PROTEIN/VITAMIN SHAKE PO) Take 2-3 Bottles by mouth daily.      . ondansetron (ZOFRAN) 4 MG tablet TAKE 1 TABLET BY MOUTH EVERY 8 HOURS AS NEEDED FOR NAUSEA  30 tablet  1  . oxyCODONE-acetaminophen (PERCOCET) 10-325 MG per tablet Take 1 tablet by mouth 5 (five) times daily as needed for pain.      . pantoprazole (PROTONIX) 40 MG tablet Take 40 mg by mouth 2 (two) times daily.      Marland Kitchen topiramate (TOPAMAX) 25 MG tablet Take 25 mg by mouth daily.       No current facility-administered medications on file prior to visit.   No Known Allergies History   Social History  . Marital Status: Divorced    Spouse Name: N/A    Number of Children: 6  . Years of Education: N/A   Occupational History  . RETIRED    Social History Main Topics  . Smoking status: Current Every Day Smoker -- 1.00 packs/day for 60 years    Types: Cigarettes  . Smokeless tobacco: Never Used  . Alcohol Use: No     Comment: occasional  . Drug Use: No  . Sexually Active: No   Other Topics Concern  . Not on file   Social History Narrative   4-6 cups of coffee daily       Review of Systems  All other systems reviewed and are negative.       Objective:   Physical Exam  Vitals reviewed. Constitutional: She appears well-developed and well-nourished.  HENT:  Mouth/Throat: Oropharynx is clear and moist. No oropharyngeal exudate.  Neck: Neck supple. No thyromegaly present.  Cardiovascular: Normal rate and normal heart sounds.   Pulmonary/Chest: Effort normal. She has rales.  Abdominal: Soft. Bowel sounds are normal. She exhibits no distension. There is no tenderness. There is no rebound and no  guarding.          Assessment & Plan:  1. Diarrhea Likely due to antibiotic use. I recommended the patient begin a probiotic over-the-counter.  I will send C. difficile toxins to rule out C. difficile colitis.  If positive she will require Flagyl. Recheck next week. - Clostridium Difficile by PCR; Future  2. HCAP (healthcare-associated pneumonia) Finish Levaquin 750 mg by mouth daily for 11 days total.  The patient has lost substantial weight. Her  weight is down from 107 pounds to 93 pounds. I recommended discontinuation of Topamax. It does not seem to be helping her headaches and can certainly suppress her appetite. Also recommended Ensure twice a day. Recheck next week.

## 2013-01-25 NOTE — Telephone Encounter (Signed)
Refill at follow up appointment today.

## 2013-01-25 NOTE — Telephone Encounter (Signed)
?   OK to Refill  

## 2013-02-01 ENCOUNTER — Telehealth: Payer: Self-pay | Admitting: Family Medicine

## 2013-02-01 NOTE — Telephone Encounter (Signed)
Diarrhea had stopped. Last night started cramping again and had one black diarrhea stool.  Also having nausea and vomiting several  times yellowish colored fluid..  Still with abdominal cramping.  Home health nurse told them to call here.  Please advise

## 2013-02-03 ENCOUNTER — Other Ambulatory Visit: Payer: Self-pay | Admitting: Cardiovascular Disease

## 2013-02-04 ENCOUNTER — Ambulatory Visit (INDEPENDENT_AMBULATORY_CARE_PROVIDER_SITE_OTHER): Payer: 59 | Admitting: Family Medicine

## 2013-02-04 ENCOUNTER — Ambulatory Visit
Admission: RE | Admit: 2013-02-04 | Discharge: 2013-02-04 | Disposition: A | Discharge status: Home / Self Care | Payer: 59 | Source: Ambulatory Visit | Attending: Family Medicine | Admitting: Family Medicine

## 2013-02-04 ENCOUNTER — Encounter: Payer: Self-pay | Admitting: Family Medicine

## 2013-02-04 VITALS — BP 142/74 | HR 82 | Temp 98.4°F | Resp 16 | Wt 92.0 lb

## 2013-02-04 DIAGNOSIS — R112 Nausea with vomiting, unspecified: Secondary | ICD-10-CM

## 2013-02-04 DIAGNOSIS — R05 Cough: Secondary | ICD-10-CM

## 2013-02-04 DIAGNOSIS — K921 Melena: Secondary | ICD-10-CM

## 2013-02-04 NOTE — Progress Notes (Signed)
Subjective:    Patient ID: Kathy Marshall, female    DOB: Jun 13, 1935, 78 y.o.   MRN: [id number]  Diarrhea   01/25/13 Patient was recently admitted with pneumonia. There was some debate about whether this was a aspiration pneumonia versus healthcare acquired pneumonia versus a community-acquired pneumonia. She was discharged home on Augmentin and Levaquin. She completed the Augmentin. She never took the Levaquin. She continues to have a slight cough and left basilar crackles. She is also having severe diarrhea and fecal incontinence at night. She denies fever or abdominal pain. She is requesting for a refill on her Xanax. Hospital admission was significant for altered mental status. She is on numerous medications that are sedating including MS Contin, Percocet, and Xanax. MS Contin and Percocet prescribed to orthopedics. We are prescribing her Xanax. At her last office visit, we discussed weaning down on Xanax. She is here today with her daughter. Her daughter is now all living with the patient and supervising all of her healthcare.  She is administering her medicines. The daughter states that she is in agreement that the patient is taking too many medications and she is willing to try to help wean her off the Xanax. She states that her mother does occasionally become extremely agitated and tearful and does require Xanax on occasion.  At that time, my plan was: 1. Diarrhea Likely due to antibiotic use. I recommended the patient begin a probiotic over-the-counter.  I will send C. difficile toxins to rule out C. difficile colitis.  If positive she will require Flagyl. Recheck next week. - Clostridium Difficile by PCR; Future  2. HCAP (healthcare-associated pneumonia) Finish Levaquin 750 mg by mouth daily for 11 days total.  The patient has lost substantial weight. Her weight is down from 107 pounds to 93 pounds. I recommended discontinuation of Topamax. It does not seem to be helping her headaches  and can certainly suppress her appetite. Also recommended Ensure twice a day. Recheck next week.  02/04/13 She presents today with nausea and vomiting for the last 3 days. She continues to have watery diarrhea. Her daughter states that the diarrhea stopped for proximally one week and that is why they  did not return the stool sample for C. difficile testing.  However now she is extremely weak. She is eating very little. She is also having black stools. A fingerstick hemoglobin today in the office is 13.2 which is unchanged from her last office visit.  She denies any fevers. She is having some shortness of breath. Her weight is down 1 pound from last week. Past Medical History  Diagnosis Date  . Low back pain   . Bronchitis   . Chronic hoarseness   . Sleep apnea, obstructive   . Renal artery stenosis   . PVD (peripheral vascular disease)   . Hypercholesteremia   . Pain in limb   . COPD (chronic obstructive pulmonary disease)   . Benign essential hypertension   . Atherosclerosis     coronary vessel type  . Coronary artery disease   . Diverticulosis   . History of GI diverticular bleed   . History of esophagitis   . H. pylori infection   . Altered mental status   . Pneumonia   . TIA (transient ischemic attack) 07/2010  . Arthritis   . GERD (gastroesophageal reflux disease)   . History of blood clots   . Tubular adenoma 2012  . Heart murmur   . CHF (congestive heart failure)   .  Anginal pain   . Myocardial infarct 1990's    "I've only had one" (12/06/2012)  . Exertional shortness of breath   . Daily headache   . Anxiety   . Gastric ulcer 07/2012    large; required clipping Hattie Perch 08/23/2012 (12/06/2012)   Past Surgical History  Procedure Laterality Date  . Oophorectomy    . Coronary angioplasty with stent placement  2006    Cypher stent to RCA. On cath in 2008 stent described as widely patent.   . Esophagogastroduodenoscopy N/A 08/21/2012    Procedure: ESOPHAGOGASTRODUODENOSCOPY  (EGD);  Surgeon: Hart Carwin, MD;  Location: [facility] ENDOSCOPY;  Service: Endoscopy;  Laterality: N/A;  . Vaginal hysterectomy    . Esophagogastroduodenoscopy N/A 12/07/2012    Procedure: ESOPHAGOGASTRODUODENOSCOPY (EGD);  Surgeon: Iva Boop, MD;  Location: [facility] ENDOSCOPY;  Service: Endoscopy;  Laterality: N/A;   Current Outpatient Prescriptions on File Prior to Visit  Medication Sig Dispense Refill  . acetaminophen (TYLENOL) 500 MG tablet Take 1,000 mg by mouth every 4 (four) hours as needed (For headache).      Marland Kitchen albuterol (PROAIR HFA) 108 (90 BASE) MCG/ACT inhaler Inhale 2 puffs into the lungs every 4 (four) hours as needed for wheezing.  1 Inhaler  6  . ALPRAZolam (XANAX) 0.5 MG tablet Take 1 tablet (0.5 mg total) by mouth daily as needed for sleep.  30 tablet  0  . ALPRAZolam (XANAX) 0.5 MG tablet Take 0.5 mg by mouth as needed for sleep.      Marland Kitchen atorvastatin (LIPITOR) 80 MG tablet Take 80 mg by mouth at bedtime.      . clopidogrel (PLAVIX) 75 MG tablet Take 1 tablet (75 mg total) by mouth daily.  90 tablet  3  . ferrous sulfate 325 (65 FE) MG tablet Take 325 mg by mouth daily with breakfast.      . gabapentin (NEURONTIN) 300 MG capsule Take 300 mg by mouth at bedtime.      . isosorbide mononitrate (IMDUR) 60 MG 24 hr tablet Take 1 tablet (60 mg total) by mouth daily.  30 tablet  6  . levofloxacin (LEVAQUIN) 750 MG tablet Take 1 tablet (750 mg total) by mouth daily. X 11 days  11 tablet  0  . losartan (COZAAR) 50 MG tablet Take 50 mg by mouth daily.      . metoprolol tartrate (LOPRESSOR) 25 MG tablet Take 0.5 tablets (12.5 mg total) by mouth 2 (two) times daily.      Marland Kitchen morphine (MS CONTIN) 30 MG 12 hr tablet Take 30 mg by mouth. Every 12 hours as needed for pain      . Nutritional Supplements (COMPLETE PROTEIN/VITAMIN SHAKE PO) Take 2-3 Bottles by mouth daily.      . ondansetron (ZOFRAN) 4 MG tablet TAKE 1 TABLET BY MOUTH EVERY 8 HOURS AS NEEDED FOR NAUSEA  30 tablet  1  .  oxyCODONE-acetaminophen (PERCOCET) 10-325 MG per tablet Take 1 tablet by mouth 5 (five) times daily as needed for pain.      . pantoprazole (PROTONIX) 40 MG tablet Take 40 mg by mouth 2 (two) times daily.      Marland Kitchen topiramate (TOPAMAX) 25 MG tablet Take 25 mg by mouth daily.       No current facility-administered medications on file prior to visit.   No Known Allergies History   Social History  . Marital Status: Divorced    Spouse Name: N/A    Number of Children: 6  . Years  of Education: N/A   Occupational History  . RETIRED    Social History Main Topics  . Smoking status: Current Every Day Smoker -- 1.00 packs/day for 60 years    Types: Cigarettes  . Smokeless tobacco: Never Used  . Alcohol Use: No     Comment: occasional  . Drug Use: No  . Sexually Active: No   Other Topics Concern  . Not on file   Social History Narrative   4-6 cups of coffee daily       Review of Systems  Gastrointestinal: Positive for diarrhea.  All other systems reviewed and are negative.       Objective:   Physical Exam  Vitals reviewed. Constitutional: She appears well-developed. She appears cachectic.  HENT:  Mouth/Throat: Oropharynx is clear and moist. No oropharyngeal exudate.  Neck: Neck supple. No thyromegaly present.  Cardiovascular: Normal rate and normal heart sounds.   Pulmonary/Chest: Effort normal. She has rales.  Abdominal: Soft. Bowel sounds are normal. She exhibits no distension. There is no tenderness. There is no rebound and no guarding.   she has faint crackles in Rales in the left lower lobe       Assessment & Plan:  We discussed her options with the daughter and the patient. She does not want to go to the emergency room at the present time. Therefore I will initiate an outpatient workup of her symptoms.  1. Melena Fingerstick hemoglobin today is 13.2 which is unchanged. Discontinue Plavix temporarily. Concern she may have reirritated her stomach due to her vomiting  and I am hoping off the Plavix the bleeding will subside. If this worsens she is to go to the hospital immediately - Hemoglobin, fingerstick  2. Cough Check a chest x-ray. If there is a left lower lobe pneumonia, this is a hospital-acquired pneumonia which failed one week of outpatient therapy with Levaquin. Therefore she will require hospitalization and antibiotics. This may also be a cause of her nausea and vomiting - DG Abd Acute W/Chest; Future  3. Nausea with vomiting Likely due to antibiotic use. Recommended she discontinue Levaquin. Continue to take Zofran as needed for nausea and vomiting. Push fluids and a Brat diet.  I will check a two-view of the abdomen to rule out bowel obstruction or ileus. If she has a SBO or ileus she will need to the hospital. Her physical exam is not consistent with either.  I have asked the family to return with a C. difficile toxin test to rule out C. difficile colitis as soon as possible. She may require Flagyl to treat C. Difficile. - DG Abd Acute W/Chest; Future  If the patient acutely decompensates in the meantime she is to go to the emergency room immediately

## 2013-02-04 NOTE — Telephone Encounter (Signed)
Pt cont to have nausea/vomiting and feeling very weak.  appt made with provider today

## 2013-02-04 NOTE — Telephone Encounter (Signed)
Needs to be seen if not better

## 2013-02-04 NOTE — Telephone Encounter (Signed)
Rx was sent to pharmacy electronically. 

## 2013-02-05 ENCOUNTER — Telehealth: Payer: Self-pay | Admitting: Family Medicine

## 2013-02-05 DIAGNOSIS — M545 Low back pain: Secondary | ICD-10-CM

## 2013-02-05 DIAGNOSIS — G8929 Other chronic pain: Secondary | ICD-10-CM

## 2013-02-05 DIAGNOSIS — J441 Chronic obstructive pulmonary disease with (acute) exacerbation: Secondary | ICD-10-CM

## 2013-02-05 DIAGNOSIS — J189 Pneumonia, unspecified organism: Secondary | ICD-10-CM

## 2013-02-05 MED ORDER — SUCRALFATE 1 G PO TABS: 1.0000 g | ORAL_TABLET | Freq: Four times a day (QID) | ORAL | Status: DC

## 2013-02-05 MED ORDER — ONDANSETRON HCL 4 MG PO TABS: 4.0000 mg | ORAL_TABLET | Freq: Three times a day (TID) | ORAL | Status: DC | PRN

## 2013-02-05 NOTE — Telephone Encounter (Signed)
Message copied by Donne Anon on Tue Feb 05, 2013  9:24 AM ------      Message from: Lynnea Ferrier      Created: Tue Feb 05, 2013  7:11 AM       Good news.  They see no pneumonia, bowel obstruction, or ileus/blockage.  Stop levaquin, stop plavix, bring back c diff stool sample asap.  Begin imodium for diarrhea, continue zofran for nausea, add carafate 1 g poqid for possible gastritis, recheck in 48 hours or go to ER if worse. ------

## 2013-02-05 NOTE — Telephone Encounter (Signed)
Spoke to daughter.  Relayed all of provider instructions.  Zofran refilled and Carafate sent to pharmacy.  F/U appt made for Thursday

## 2013-02-07 ENCOUNTER — Ambulatory Visit: Payer: Medicare Other | Admitting: Family Medicine

## 2013-02-07 ENCOUNTER — Other Ambulatory Visit: Payer: Self-pay | Admitting: Family Medicine

## 2013-02-08 ENCOUNTER — Ambulatory Visit: Payer: Medicare Other | Admitting: Family Medicine

## 2013-02-11 ENCOUNTER — Other Ambulatory Visit: Payer: Self-pay | Admitting: Family Medicine

## 2013-02-11 NOTE — Telephone Encounter (Signed)
?   OK to Refill  

## 2013-02-12 ENCOUNTER — Telehealth: Payer: Self-pay | Admitting: Family Medicine

## 2013-02-12 NOTE — Telephone Encounter (Signed)
Nurse says patient doing better.  Has question about Protonix and Carafate  Do you want both??  Also Patient declines therapy is getting her strength back on her own.  Lung sounds are good and is eating better.

## 2013-02-12 NOTE — Telephone Encounter (Signed)
Cindy at [facility] aware

## 2013-02-12 NOTE — Telephone Encounter (Signed)
RX called in .

## 2013-02-12 NOTE — Telephone Encounter (Signed)
ok 

## 2013-02-12 NOTE — Telephone Encounter (Signed)
Yes, use both carafate and protonix together.

## 2013-02-13 ENCOUNTER — Telehealth: Payer: Self-pay | Admitting: *Deleted

## 2013-02-13 ENCOUNTER — Encounter: Payer: Self-pay | Admitting: *Deleted

## 2013-02-13 NOTE — Telephone Encounter (Signed)
Note sent to patient to call and reschedule lower extremity arterial ultrasound that she missed  scheduled on June 16th.

## 2013-02-19 ENCOUNTER — Other Ambulatory Visit: Payer: Self-pay | Admitting: Internal Medicine

## 2013-02-20 ENCOUNTER — Telehealth: Payer: Self-pay | Admitting: Internal Medicine

## 2013-02-20 NOTE — Telephone Encounter (Signed)
Left message for patient to call back. She needs an office visit. She has not been seen since 01-2011.

## 2013-02-21 MED ORDER — PANTOPRAZOLE SODIUM 40 MG PO TBEC: 40.0000 mg | DELAYED_RELEASE_TABLET | Freq: Two times a day (BID) | ORAL | Status: DC

## 2013-02-21 NOTE — Telephone Encounter (Signed)
I have spoken to patient's caregiver, [name]. She states that patient has been having continued nausea and vomiting which Dr Tanya Nones has been trying to treat but is now suggesting she see GI again. Patient had an EGD in hospital (for hematemesis) 07/2013 which showed gastric ulcer. Patient had repeat EGD 12/07/12 which showed chronic gastritis but healed ulcer site. Patient's daughter states that she has not seen any further blood in vomit since hospitalization. Patient is taking Protonix 40 mg twice daily, Carafate four times daily, and zofran as needed. She has apparently tried promethazine for the vomiting but symptoms were not relieved with that either. I offered an appointment with an physician extender today or tomorrow (8/28 or 8/29) but caregiver states that she would be unable to come until next week. Patient has been scheduled for an office visit with Mike Gip, PA-C on 02/26/13 @ 10:00 am. I will send a refill on Protonix twice daily until her appointment on Tuesday with Amy. Dr Juanda Chance, would you like to add anything??

## 2013-02-21 NOTE — Telephone Encounter (Signed)
Patients daughter/caregiver advised of Dr Regino Schultze wishes. Patient just got rx for zofran 4 mg on 02/05/13. Therefore, I have advised her to have patient take zofran 2 tablets (=8 mg) every 8 hours as needed for nausea. She verbalizes understanding.

## 2013-02-21 NOTE — Telephone Encounter (Signed)
Please try Zofran 8 mg, #20, 1 po q 8 hrs prn nausea, 1 refill.

## 2013-02-22 ENCOUNTER — Telehealth: Payer: Self-pay | Admitting: Family Medicine

## 2013-02-22 NOTE — Telephone Encounter (Signed)
Pt has declined physical therapy two times now. She says that she does need it or want it. If you have any questions for the physical therapist you can call her at [phone].

## 2013-02-26 ENCOUNTER — Ambulatory Visit (INDEPENDENT_AMBULATORY_CARE_PROVIDER_SITE_OTHER): Payer: 59 | Admitting: Physician Assistant

## 2013-02-26 ENCOUNTER — Encounter: Payer: Self-pay | Admitting: Physician Assistant

## 2013-02-26 ENCOUNTER — Other Ambulatory Visit (INDEPENDENT_AMBULATORY_CARE_PROVIDER_SITE_OTHER): Payer: 59

## 2013-02-26 VITALS — BP 130/66 | HR 56 | Ht 60.75 in | Wt 93.0 lb

## 2013-02-26 DIAGNOSIS — R634 Abnormal weight loss: Secondary | ICD-10-CM

## 2013-02-26 DIAGNOSIS — R110 Nausea: Secondary | ICD-10-CM

## 2013-02-26 DIAGNOSIS — R1011 Right upper quadrant pain: Secondary | ICD-10-CM

## 2013-02-26 LAB — BASIC METABOLIC PANEL
BUN: 13 mg/dL (ref 6–23)
Chloride: 105 mEq/L (ref 96–112)
GFR: 92.49 mL/min (ref >60.00)
Glucose, Bld: 100 mg/dL — ABNORMAL HIGH (ref 70–99)
Potassium: 3.8 mEq/L (ref 3.5–5.1)
Sodium: 137 mEq/L (ref 135–145)

## 2013-02-26 MED ORDER — PANTOPRAZOLE SODIUM 40 MG PO TBEC: DELAYED_RELEASE_TABLET | ORAL | Status: DC

## 2013-02-26 MED ORDER — ONDANSETRON HCL 8 MG PO TABS: ORAL_TABLET | ORAL | Status: DC

## 2013-02-26 NOTE — Patient Instructions (Addendum)
Please go to the basement level to have your labs drawn.  We sent prescriptions to [facility] [address] for Protonix twice daily and zofran 8 mg once daily.    You have been scheduled for a CT scan of the abdomen and pelvis at [facility] ([address]---this is in the same building as Architectural technologist).   You are scheduled on Friday 03-01-2013 at 1:00 PM . You should arrive at  12:45 PM prior to your appointment time for registration. Please follow the written instructions below on the day of your exam:  WARNING: IF YOU ARE ALLERGIC TO IODINE/X-RAY DYE, PLEASE NOTIFY RADIOLOGY IMMEDIATELY AT [phone]! YOU WILL BE GIVEN A 13 HOUR PREMEDICATION PREP.  1) Do not eat or drink anything after 9;00 a m(4 hours prior to your test) 2) You have been given 2 bottles of oral contrast to drink. The solution may taste  better if refrigerated, but do NOT add ice or any other liquid to this solution. Shake well before drinking.    Drink 1 bottle of contrast @ 11:00 am  (2 hours prior to your exam)  Drink 1 bottle of contrast @ 12:00 Noon (1 hour prior to your exam)  You may take any medications as prescribed with a small amount of water except for the following: Metformin, Glucophage, Glucovance, Avandamet, Riomet, Fortamet, Actoplus Met, Janumet, Glumetza or Metaglip. The above medications must be held the day of the exam AND 48 hours after the exam.  The purpose of you drinking the oral contrast is to aid in the visualization of your intestinal tract. The contrast solution may cause some diarrhea. Before your exam is started, you will be given a small amount of fluid to drink. Depending on your individual set of symptoms, you may also receive an intravenous injection of x-ray contrast/dye. Plan on being at [facility] for 30 minutes or long, depending on the type of exam you are having performed.  If you have any questions regarding your exam or if you need to reschedule,  you may call the CT department at [phone] between the hours of 8:00 am and 5:00 pm, Monday-Friday.  ________________________________________________________________________

## 2013-02-26 NOTE — Progress Notes (Signed)
Subjective:    Patient ID: Kathy Marshall, female    DOB: [birth year], 78 y.o.   MRN: [id number]  HPI Kathy Marshall is a 78 year old white female, known to Dr. Juanda Chance. She has multiple medical problems as outlined  Below. She was hospitalized in 78 with a GI bleed secondary to a prepyloric ulcer which required epinephrine and Endo Clip. She had repeat EGD done in June of 2014 due to recurrent hematemesis. EGD at that time showed a moderate gastritis she was also hospitalized this summer for a pneumonia. She comes in today with her daughter with complaints of persistent nausea over the past one month. She has been seen by her primary care provider Dr. Tanya Nones and has been placed on Zofran which was helping but not stopping her symptoms. She is continued on Protonix 40 mg 1 g 4 times daily about 76 weeks ago. The patient states that she thinks this does help her stomach again has not alleviated her symptoms. She had been having problems with diarrhea which has completely resolved. She is on numerous medications including benzodiazepine, and MS Contin  Patient states she's not been placed on any new medications. She had been taking Topamax which was stopped within the past month without any improvement in her symptoms. She says she usually feels worse early in the morning wakes up nauseated and often vomits early in the morning. Her daughter says she seems to do better by mid afternoon. She is not vomiting up any undigested food but brings up a sour yellow liquid. She says recently she has dry heaves as well. If she does vomits and lies down for an hour she will feel much better. She had lost 10-12 pounds but says she has leveled off over the past week. She has not had any fever or chills. Initially she denied any abdominal pain. She denies any dysphagia or odynophagia. Her nausea is worse postprandially and she's been eating very little . She is keeping 2 Ensures down every day.     Review of Systems   Constitutional: Positive for appetite change and unexpected weight change.  HENT: Negative.   Eyes: Negative.   Respiratory: Negative.   Cardiovascular: Negative.   Gastrointestinal: Positive for nausea, vomiting and abdominal pain.  Endocrine: Negative.   Genitourinary: Negative.   Musculoskeletal: Negative.   Skin: Negative.   Allergic/Immunologic: Negative.   Neurological: Positive for weakness.  Hematological: Negative.   Psychiatric/Behavioral: Negative.    Outpatient Prescriptions Prior to Visit  Medication Sig Dispense Refill  . acetaminophen (TYLENOL) 500 MG tablet Take 1,000 mg by mouth every 4 (four) hours as needed (For headache).      Marland Kitchen albuterol (PROAIR HFA) 108 (90 BASE) MCG/ACT inhaler Inhale 2 puffs into the lungs every 4 (four) hours as needed for wheezing.  1 Inhaler  6  . ALPRAZolam (XANAX) 0.5 MG tablet Take 0.5 mg by mouth as needed for sleep.      Marland Kitchen ALPRAZolam (XANAX) 0.5 MG tablet TAKE 1 TABLET BY MOUTH ONCE A DAY AS NEEDED  30 tablet  0  . atorvastatin (LIPITOR) 80 MG tablet TAKE 1 TABLET AT BEDTIME.  30 tablet  7  . clopidogrel (PLAVIX) 75 MG tablet Take 1 tablet (75 mg total) by mouth daily.  90 tablet  3  . ferrous sulfate 325 (65 FE) MG tablet Take 325 mg by mouth daily with breakfast.      . gabapentin (NEURONTIN) 300 MG capsule Take 300 mg by mouth at bedtime.      Marland Kitchen  isosorbide mononitrate (IMDUR) 60 MG 24 hr tablet Take 1 tablet (60 mg total) by mouth daily.  30 tablet  6  . levofloxacin (LEVAQUIN) 750 MG tablet Take 1 tablet (750 mg total) by mouth daily. X 11 days  11 tablet  0  . losartan (COZAAR) 50 MG tablet Take 50 mg by mouth daily.      . metoprolol tartrate (LOPRESSOR) 25 MG tablet Take 0.5 tablets (12.5 mg total) by mouth 2 (two) times daily.      Marland Kitchen morphine (MS CONTIN) 30 MG 12 hr tablet Take 30 mg by mouth. Every 12 hours as needed for pain      . Nutritional Supplements (COMPLETE PROTEIN/VITAMIN SHAKE PO) Take 2-3 Bottles by mouth daily.       Marland Kitchen oxyCODONE-acetaminophen (PERCOCET) 10-325 MG per tablet Take 1 tablet by mouth 5 (five) times daily as needed for pain.      Marland Kitchen sucralfate (CARAFATE) 1 G tablet Take 1 tablet (1 g total) by mouth 4 (four) times daily.  120 tablet  1  . losartan (COZAAR) 50 MG tablet TAKE 1 TABLET BY MOUTH EVERY DAY  30 tablet  3  . ondansetron (ZOFRAN) 4 MG tablet Take 1 tablet (4 mg total) by mouth every 8 (eight) hours as needed for nausea.  30 tablet  1  . pantoprazole (PROTONIX) 40 MG tablet Take 1 tablet (40 mg total) by mouth 2 (two) times daily.  60 tablet  0  . topiramate (TOPAMAX) 25 MG tablet Take 25 mg by mouth daily.      Marland Kitchen topiramate (TOPAMAX) 25 MG tablet TAKE 4 TABLETS (100 MG TOTAL) BY MOUTH DAILY.  120 tablet  3   No facility-administered medications prior to visit.   No Known Allergies Patient Active Problem List   Diagnosis Date Noted  . HCAP (healthcare-associated pneumonia) 01/17/2013  . Acute encephalopathy 01/16/2013  . Unspecified gastritis and gastroduodenitis without mention of hemorrhage 12/07/2012  . Protein-calorie malnutrition, severe 12/06/2012  . GI bleed 08/20/2012  . CAD (coronary artery disease), tandem Cyper DES to prox. and Mid RCA 2006, stable at cath 208 08/20/2012  . MYOCARDIAL INFARCTION, HX OF 08/19/2010  . DIVERTICULOSIS OF COLON 08/19/2010  . WEIGHT LOSS 08/19/2010  . HYPERCHOLESTEROLEMIA 02/19/2007  . HYPERTENSION, BENIGN ESSENTIAL 02/19/2007  . COPD 02/19/2007  . GERD 02/19/2007  . LOW BACK PAIN, CHRONIC 02/19/2007  . PAIN IN LIMB 02/19/2007  . HEADACHE, CHRONIC 02/19/2007  . HOARSENESS, CHRONIC 02/19/2007  . DIVERTICULAR BLEEDING, HX OF 08/25/2005  . OBSTRUCTIVE SLEEP APNEA 09/06/2002  . RENAL ARTERY STENOSIS 11/26/1999  . PERIPHERAL VASCULAR DISEASE 11/26/1999  . ATHEROSCLEROSIS, CORONARY, VESSEL TYPE NOS 06/27/1997   History  Substance Use Topics  . Smoking status: Current Every Day Smoker -- 0.50 packs/day for 60 years    Types: Cigarettes   . Smokeless tobacco: Never Used  . Alcohol Use: No     Comment: occasional      family history includes Heart disease in her father, mother, and sister; Lung cancer in her sister; Stomach cancer in her mother; Stroke in her father and mother. There is no history of Colon cancer.  Objective:   Physical Exam  well-developed frail cachectic-appearing older white female in no acute distress, accompanied by her daughter blood pressure 130/66 pulse 56 height 5 foot weight 93. HEENT; nontraumatic normocephalic EOMI PERRLA sclera anicteric, Supple; no JVD, Cardiovascular; regular rate and rhythm with S1-S2 no murmur or gallop, Pulmonary; decreased breath sounds bilaterally, Abdomen; soft mildly  tender in the right upper quadrant and right mid quadrant there is no guarding or rebound no palpable mass or hepatosplenomegaly sounds are present there is no succussion splash, she has excess skin folds evident from weight loss, Rectal; exam not done, Extremities; no clubbing cyanosis or edema skin warm and dry, Psych; mood and affect normal and appropriate      Assessment & Plan:   #3  78 year old female with history of prepyloric ulcer with bleed February 2014 and repeat EGD June 2014 for hematemesis showing a moderate gastritis now presenting with persistent nausea x1 month. Etiology of her nausea is on clear in may be multifactorial She has multiple medical problems is on multiple medications including pain medication. Differential includes medication-induced nausea, gastroparesis secondary to chronic narcotics or other intra-abdominal inflammatory process occluding gallbladder disease or pancreatic disease #2 COPD #3 coronary artery disease #4 peripheral vascular disease #5 pneumonia summer 2014 #6 chronic pain syndrome with chronic narcotic use #7 diverticular disease #8 hypertension #9 weight loss associated with #1  Plan; increased Zofran to 8 mg by mouth every 8 hours-Rx sent Increase Protonix  to 40 mg by mouth twice daily Continue Carafate 1 g 3 times daily between meals Patient has stopped Topamax. Will also stop oral iron Schedule for CT scan of the abdomen and pelvis with contrast Bmet  Today Further plans pending findings of above-she will need office followup with Dr. Juanda Chance

## 2013-02-26 NOTE — Progress Notes (Signed)
Reviewed and agree. CT scan of the abdomen  To r/o acute process.

## 2013-03-01 ENCOUNTER — Inpatient Hospital Stay: Admission: RE | Admit: 2013-03-01 | Payer: 59 | Source: Ambulatory Visit

## 2013-03-04 ENCOUNTER — Inpatient Hospital Stay: Admission: RE | Admit: 2013-03-04 | Payer: Commercial Managed Care - HMO | Source: Ambulatory Visit

## 2013-03-05 ENCOUNTER — Inpatient Hospital Stay: Admission: RE | Admit: 2013-03-05 | Payer: Commercial Managed Care - HMO | Source: Ambulatory Visit

## 2013-03-07 ENCOUNTER — Ambulatory Visit (INDEPENDENT_AMBULATORY_CARE_PROVIDER_SITE_OTHER)
Admission: RE | Admit: 2013-03-07 | Discharge: 2013-03-07 | Disposition: A | Discharge status: Home / Self Care | Payer: 59 | Source: Ambulatory Visit | Attending: Physician Assistant | Admitting: Physician Assistant

## 2013-03-07 DIAGNOSIS — R634 Abnormal weight loss: Secondary | ICD-10-CM

## 2013-03-07 DIAGNOSIS — R1011 Right upper quadrant pain: Secondary | ICD-10-CM

## 2013-03-07 DIAGNOSIS — R110 Nausea: Secondary | ICD-10-CM

## 2013-03-07 MED ORDER — IOHEXOL 300 MG/ML  SOLN
80.0000 mL | Freq: Once | INTRAMUSCULAR | Status: AC | PRN
  Administered 2013-03-07: 80 mL via INTRAVENOUS

## 2013-03-08 ENCOUNTER — Telehealth: Payer: Self-pay | Admitting: Family Medicine

## 2013-03-08 NOTE — Telephone Encounter (Signed)
[facility] nurse called and said that pt is starting to get a cold and that her lungs are starting to sound diminished. Pt wants to know what she can take.

## 2013-03-08 NOTE — Telephone Encounter (Signed)
Begin mucinex 400 mg poq6 hrs prn cough or congestion

## 2013-03-08 NOTE — Telephone Encounter (Signed)
Called patient home and spoke to daughter.  Told to use Mucinex as recommended by provider

## 2013-03-12 ENCOUNTER — Telehealth: Payer: Self-pay | Admitting: Internal Medicine

## 2013-03-12 NOTE — Telephone Encounter (Signed)
Spoke with patient's EC. She is asking for CT results. Please, advise.

## 2013-03-13 NOTE — Telephone Encounter (Signed)
Please let them know  I had forwarded the CT result to Dr. Juanda Chance to review and was waiting for her response... It shows severe vascular disease  In vessels supplying the Gi tract- will need to be addressed-probably refer to a vascular surgeon- not sure this is causing her nausea but certainly could be ... Have sent another request to Verlee Monte to review today. Why dont we gether a follow up with DB asap -thanks

## 2013-03-13 NOTE — Telephone Encounter (Signed)
Spoke with patient's EC and gave her Mike Gip, [place] recommendations. Scheduled OV with Dr. Juanda Chance on 03/26/13 at 2:30 PM.

## 2013-03-22 ENCOUNTER — Other Ambulatory Visit: Payer: Self-pay | Admitting: Family Medicine

## 2013-03-26 ENCOUNTER — Encounter: Payer: Self-pay | Admitting: Internal Medicine

## 2013-03-26 ENCOUNTER — Ambulatory Visit (INDEPENDENT_AMBULATORY_CARE_PROVIDER_SITE_OTHER): Payer: 59 | Admitting: Internal Medicine

## 2013-03-26 VITALS — BP 144/78 | HR 60 | Ht 61.0 in | Wt 95.1 lb

## 2013-03-26 DIAGNOSIS — R110 Nausea: Secondary | ICD-10-CM

## 2013-03-26 DIAGNOSIS — R634 Abnormal weight loss: Secondary | ICD-10-CM

## 2013-03-26 DIAGNOSIS — R112 Nausea with vomiting, unspecified: Secondary | ICD-10-CM

## 2013-03-26 DIAGNOSIS — K2960 Other gastritis without bleeding: Secondary | ICD-10-CM

## 2013-03-26 MED ORDER — METOCLOPRAMIDE HCL 5 MG PO TABS: 5.0000 mg | ORAL_TABLET | Freq: Two times a day (BID) | ORAL | Status: DC

## 2013-03-26 MED ORDER — ONDANSETRON HCL 8 MG PO TABS: ORAL_TABLET | ORAL | Status: DC

## 2013-03-26 NOTE — Progress Notes (Signed)
HENRY DEMERITT 25-Mar-1935 MRN [id number]   History of Present Illness:  This is a 78 year old white female,1ppd smoker, with severe vascular disease who was recently seen in the emergency room and was evaluated for nausea and vomiting. She had a prepyloric ulcer on an upper endoscopy in February 2014. A follow-up EGD in 11/2012 showed a healed ulcer and gastritis. She denies abdominal pain but takes morphine twice a day for back pain. A CT scan of the abdomen and pelvis on 02/27/2003 showed nodular opacities in the left lower lobe of the lung, cardiomegaly, normal gallbladder, liver and an increased stool burden. SMA calcifications and calcification of the aorta and infrarenal arteries were seen as well. She has a small aortic aneurysm 9x6 mm with question of aortic ulcer. She had a prominent common bile duct with smooth tapering distally ( normal LFT's) . She has been eating very little, mostly pudding and liquids due to nausea and vomiting. She vomits when she first gets up in the morning, usually yellow liquid and bile even before she eats. She also vomits food. She weighed 93 pounds 3 weeks ago. Today, she weighs 95 pounds.   Past Medical History  Diagnosis Date  . Low back pain   . Bronchitis   . Chronic hoarseness   . Sleep apnea, obstructive   . Renal artery stenosis   . PVD (peripheral vascular disease)   . Hypercholesteremia   . Pain in limb   . COPD (chronic obstructive pulmonary disease)   . Benign essential hypertension   . Atherosclerosis     coronary vessel type  . Coronary artery disease   . Diverticulosis   . History of GI diverticular bleed   . History of esophagitis   . H. pylori infection   . Altered mental status   . Pneumonia   . TIA (transient ischemic attack) 07/2010  . Arthritis   . GERD (gastroesophageal reflux disease)   . History of blood clots   . Tubular adenoma 2012  . Heart murmur   . CHF (congestive heart failure)   . Anginal pain   .  Myocardial infarct 1990's    "I've only had one" (12/06/2012)  . Exertional shortness of breath   . Daily headache   . Anxiety   . Gastric ulcer 07/2012    large; required clipping Hattie Perch 08/23/2012 (12/06/2012)   Past Surgical History  Procedure Laterality Date  . Oophorectomy    . Coronary angioplasty with stent placement  2006    Cypher stent to RCA. On cath in 2008 stent described as widely patent.   . Esophagogastroduodenoscopy N/A 08/21/2012    Procedure: ESOPHAGOGASTRODUODENOSCOPY (EGD);  Surgeon: Hart Carwin, MD;  Location: [facility] ENDOSCOPY;  Service: Endoscopy;  Laterality: N/A;  . Vaginal hysterectomy    . Esophagogastroduodenoscopy N/A 12/07/2012    Procedure: ESOPHAGOGASTRODUODENOSCOPY (EGD);  Surgeon: Iva Boop, MD;  Location: [facility] ENDOSCOPY;  Service: Endoscopy;  Laterality: N/A;    reports that she has been smoking Cigarettes.  She has a 30 pack-year smoking history. She has never used smokeless tobacco. She reports that she does not drink alcohol or use illicit drugs. family history includes Heart disease in her father, mother, and sister; Lung cancer in her sister; Stomach cancer in her mother; Stroke in her father and mother. There is no history of Colon cancer. No Known Allergies      Review of Systems:  The remainder of the 10 point ROS is negative except as outlined in H&P  Physical Exam: General appearance  thin chronically ill-appearing  in no distress.thin, smells of cigarettes Eyes- non icteric. HEENT nontraumatic, normocephalic. Mouth no lesions, tongue papillated, no cheilosis. Neck supple without adenopathy, thyroid not enlarged, no carotid bruits, no JVD. Lungs Clear to auscultation bilaterally. expiratory wheezes Cor normal S1, normal S2, regular rhythm, no murmur,  quiet precordium. Abdomen:  scaphoid with increased tympany. Hyperactive bowel sounds. Mild diffuse tenderness. No palpable mass. Rectal: formed Hemoccult negative stool.  Extremities no  pedal edema. Skin no lesions. Neurological alert and oriented x 3. Psychological normal mood and affect.  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 Her symptoms are mostly nausea or vomiting. She does not have typical symptoms of intestinal angina in that her pain is not associated with eating. She is mostly nauseated. We will obtain a gastric emptying scan as she may have gastroparesis due to morphine. We will give her a small dose of Reglan 5 mg twice a day and will refill her Zofran. She is to continue on dietary supplements.I again emphasized smoking cessation.   03/26/2013 [name]

## 2013-03-26 NOTE — Patient Instructions (Addendum)
You have been given a separate informational sheet regarding your tobacco use, the importance of quitting and local resources to help you quit.  Please discontinue Topamax.  We have sent the following medications to your pharmacy for you to pick up at your convenience: Reglan 5 mg twice daily Zofran as needed (we have sent a limited supply)  You have been scheduled for a gastric emptying scan at [facility] Radiology (1st floor) on 04/10/13 at 1:00 pm. Please arrive at least 15 minutes prior to your appointment for registration. Please make certain not to have anything to eat or drink after midnight the night before your test. Hold all stomach medications (ex: Zofran, phenergan, Reglan) 48 hours prior to your test. If you need to reschedule your appointment, please contact radiology scheduling at [phone]. _____________________________________________________________________ A gastric-emptying study measures how long it takes for food to move through your stomach. There are several ways to measure stomach emptying. In the most common test, you eat food that contains a small amount of radioactive material. A scanner that detects the movement of the radioactive material is placed over your abdomen to monitor the rate at which food leaves your stomach. This test normally takes about 2 hours to complete. _____________________________________________________________________

## 2013-03-27 ENCOUNTER — Encounter: Payer: Self-pay | Admitting: Internal Medicine

## 2013-04-10 ENCOUNTER — Encounter (HOSPITAL_COMMUNITY): Payer: Medicare PPO

## 2013-04-11 ENCOUNTER — Ambulatory Visit (INDEPENDENT_AMBULATORY_CARE_PROVIDER_SITE_OTHER): Payer: Commercial Managed Care - HMO | Admitting: Family Medicine

## 2013-04-11 ENCOUNTER — Encounter: Payer: Self-pay | Admitting: Family Medicine

## 2013-04-11 VITALS — BP 140/80 | HR 68 | Temp 98.4°F | Resp 18 | Wt 97.0 lb

## 2013-04-11 DIAGNOSIS — J441 Chronic obstructive pulmonary disease with (acute) exacerbation: Secondary | ICD-10-CM

## 2013-04-11 MED ORDER — PREDNISONE 20 MG PO TABS: ORAL_TABLET | ORAL | Status: DC

## 2013-04-11 MED ORDER — MOXIFLOXACIN HCL 400 MG PO TABS: 400.0000 mg | ORAL_TABLET | Freq: Every day | ORAL | Status: DC

## 2013-04-11 NOTE — Progress Notes (Signed)
Subjective:    Patient ID: Kathy Marshall, female    DOB: 1935-04-30, 78 y.o.   MRN: [id number]  HPI Beginning Monday the patient developed sinus problems. She felt postnasal drip and copious rhinorrhea.  Later that day she developed a severe cough and wheezing. Now the cough and wheezing has gotten worse. Now the cough is productive of yellow sputum.  Last night she became acutely short of breath. Her family contemplated taking her to the emergency room but they waited for the appointment this morning. Today she is afebrile but she is wheezing on exam. She also has right basilar crackles. Past Medical History  Diagnosis Date  . Low back pain   . Bronchitis   . Chronic hoarseness   . Sleep apnea, obstructive   . Renal artery stenosis   . PVD (peripheral vascular disease)   . Hypercholesteremia   . Pain in limb   . COPD (chronic obstructive pulmonary disease)   . Benign essential hypertension   . Atherosclerosis     coronary vessel type  . Coronary artery disease   . Diverticulosis   . History of GI diverticular bleed   . History of esophagitis   . H. pylori infection   . Altered mental status   . Pneumonia   . TIA (transient ischemic attack) 07/2010  . Arthritis   . GERD (gastroesophageal reflux disease)   . History of blood clots   . Tubular adenoma 2012  . Heart murmur   . CHF (congestive heart failure)   . Anginal pain   . Myocardial infarct 1990's    "I've only had one" (12/06/2012)  . Exertional shortness of breath   . Daily headache   . Anxiety   . Gastric ulcer 07/2012    large; required clipping Hattie Perch 08/23/2012 (12/06/2012)   Current Outpatient Prescriptions on File Prior to Visit  Medication Sig Dispense Refill  . acetaminophen (TYLENOL) 500 MG tablet Take 1,000 mg by mouth every 4 (four) hours as needed (For headache).      Marland Kitchen albuterol (PROAIR HFA) 108 (90 BASE) MCG/ACT inhaler Inhale 2 puffs into the lungs every 4 (four) hours as needed for wheezing.  1  Inhaler  6  . ALPRAZolam (XANAX) 0.5 MG tablet TAKE 1 TABLET BY MOUTH EVERY DAY AS NEEDED  30 tablet  0  . atorvastatin (LIPITOR) 80 MG tablet TAKE 1 TABLET AT BEDTIME.  30 tablet  7  . clopidogrel (PLAVIX) 75 MG tablet Take 75 mg by mouth daily.       Marland Kitchen gabapentin (NEURONTIN) 300 MG capsule Take 300 mg by mouth at bedtime.      . isosorbide mononitrate (IMDUR) 60 MG 24 hr tablet Take 1 tablet (60 mg total) by mouth daily.  30 tablet  6  . levofloxacin (LEVAQUIN) 750 MG tablet Take 1 tablet (750 mg total) by mouth daily. X 11 days  11 tablet  0  . losartan (COZAAR) 50 MG tablet Take 50 mg by mouth daily.      . metoCLOPramide (REGLAN) 5 MG tablet Take 1 tablet (5 mg total) by mouth 2 (two) times daily.  60 tablet  1  . metoprolol tartrate (LOPRESSOR) 25 MG tablet Take 0.5 tablets (12.5 mg total) by mouth 2 (two) times daily.      Marland Kitchen morphine (MS CONTIN) 30 MG 12 hr tablet Take 30 mg by mouth. Every 12 hours as needed for pain      . Nutritional Supplements (COMPLETE PROTEIN/VITAMIN SHAKE PO)  Take 2-3 Bottles by mouth daily.      . ondansetron (ZOFRAN) 8 MG tablet Take 1 tab every 8 hours for nausea.  50 tablet  0  . oxyCODONE-acetaminophen (PERCOCET) 10-325 MG per tablet Take 1 tablet by mouth 5 (five) times daily as needed for pain.      . pantoprazole (PROTONIX) 40 MG tablet Take 1 tab twice daily  60 tablet  1  . sucralfate (CARAFATE) 1 G tablet Take 1 tablet (1 g total) by mouth 4 (four) times daily.  120 tablet  1   No current facility-administered medications on file prior to visit.   No Known Allergies History   Social History  . Marital Status: Divorced    Spouse Name: N/A    Number of Children: 6  . Years of Education: N/A   Occupational History  . RETIRED    Social History Main Topics  . Smoking status: Current Every Day Smoker -- 0.50 packs/day for 60 years    Types: Cigarettes  . Smokeless tobacco: Never Used  . Alcohol Use: No     Comment: occasional  . Drug Use: No   . Sexual Activity: No   Other Topics Concern  . Not on file   Social History Narrative   4-6 cups of coffee daily       Review of Systems  All other systems reviewed and are negative.       Objective:   Physical Exam  Vitals reviewed. Constitutional: She appears well-developed and well-nourished.  Neck: Neck supple. No JVD present. No thyromegaly present.  Cardiovascular: Normal rate, regular rhythm and normal heart sounds.   No murmur heard. Pulmonary/Chest: Effort normal. She has wheezes. She has rales.  Abdominal: Soft. Bowel sounds are normal.  Musculoskeletal: She exhibits no edema.  On exam she is wheezing in all 4 lung quadrants. She has bibasilar crackles right greater than left        Assessment & Plan:  1. COPD exacerbation Begin Avelox 400 mg by mouth daily for 7 days. Start prednisone taper pack. Use albuterol 2 puffs inhaled every 6 hours as needed for wheezing or shortness of breath. Recheck in 48 hours if no better or sooner if worse. - moxifloxacin (AVELOX) 400 MG tablet; Take 1 tablet (400 mg total) by mouth daily.  Dispense: 7 tablet; Refill: 0 - predniSONE (DELTASONE) 20 MG tablet; 3 tabs poqday 1-2, 2 tabs poqday 3-4, 1 tab poqday 5-6  Dispense: 12 tablet; Refill: 0

## 2013-04-15 ENCOUNTER — Telehealth: Payer: Self-pay | Admitting: Family Medicine

## 2013-04-15 NOTE — Telephone Encounter (Signed)
Patient was seen Thursday . Was told she had a touch of Pneumonia. Patient is not any better.

## 2013-04-17 ENCOUNTER — Other Ambulatory Visit: Payer: Self-pay | Admitting: Internal Medicine

## 2013-04-18 ENCOUNTER — Telehealth: Payer: Self-pay | Admitting: Internal Medicine

## 2013-04-18 NOTE — Telephone Encounter (Signed)
Spoke with EC and gave her radiology scheduling to reschedule.

## 2013-04-26 ENCOUNTER — Other Ambulatory Visit: Payer: Self-pay | Admitting: Internal Medicine

## 2013-04-26 ENCOUNTER — Other Ambulatory Visit: Payer: Self-pay | Admitting: Family Medicine

## 2013-04-26 NOTE — Telephone Encounter (Signed)
?   OK to Refill  

## 2013-04-26 NOTE — Telephone Encounter (Signed)
ok 

## 2013-05-01 ENCOUNTER — Telehealth: Payer: Self-pay | Admitting: Family Medicine

## 2013-05-01 DIAGNOSIS — R059 Cough, unspecified: Secondary | ICD-10-CM

## 2013-05-01 DIAGNOSIS — R05 Cough: Secondary | ICD-10-CM

## 2013-05-01 NOTE — Telephone Encounter (Addendum)
Pt was seen by orthopedics Hoyte (PA)  today, concerned about right sided thoracic pain. After evaluation found to have wheezing and rhonchi, he was concerned about worsening Pneumonia or COPD and wanted to alert [place]. He does not think this is [facility]. Lorain Childes she was there for routine follow-up. Will schedule CXR in AM  Discussed with pt

## 2013-05-02 ENCOUNTER — Ambulatory Visit (INDEPENDENT_AMBULATORY_CARE_PROVIDER_SITE_OTHER): Payer: 59 | Admitting: Family Medicine

## 2013-05-02 ENCOUNTER — Encounter: Payer: Self-pay | Admitting: Family Medicine

## 2013-05-02 ENCOUNTER — Ambulatory Visit
Admission: RE | Admit: 2013-05-02 | Discharge: 2013-05-02 | Disposition: A | Discharge status: Home / Self Care | Payer: Commercial Managed Care - HMO | Source: Ambulatory Visit | Attending: Family Medicine | Admitting: Family Medicine

## 2013-05-02 VITALS — BP 138/78 | HR 72 | Temp 98.2°F | Resp 16 | Wt 96.0 lb

## 2013-05-02 DIAGNOSIS — R05 Cough: Secondary | ICD-10-CM

## 2013-05-02 DIAGNOSIS — J449 Chronic obstructive pulmonary disease, unspecified: Secondary | ICD-10-CM

## 2013-05-02 MED ORDER — FLUTICASONE-SALMETEROL 45-21 MCG/ACT IN AERO: 2.0000 | INHALATION_SPRAY | Freq: Two times a day (BID) | RESPIRATORY_TRACT | Status: DC

## 2013-05-02 NOTE — Progress Notes (Signed)
Subjective:    Patient ID: Kathy Marshall, female    DOB: December 25, 1934, 78 y.o.   MRN: [id number]  HPI 04/11/13 Beginning Monday the patient developed sinus problems. She felt postnasal drip and copious rhinorrhea.  Later that day she developed a severe cough and wheezing. Now the cough and wheezing has gotten worse. Now the cough is productive of yellow sputum.  Last night she became acutely short of breath. Her family contemplated taking her to the emergency room but they waited for the appointment this morning. Today she is afebrile but she is wheezing on exam. She also has right basilar crackles.  At that time my plan was: 1. COPD exacerbation Begin Avelox 400 mg by mouth daily for 7 days. Start prednisone taper pack. Use albuterol 2 puffs inhaled every 6 hours as needed for wheezing or shortness of breath. Recheck in 48 hours if no better or sooner if worse. - moxifloxacin (AVELOX) 400 MG tablet; Take 1 tablet (400 mg total) by mouth daily.  Dispense: 7 tablet; Refill: 0 - predniSONE (DELTASONE) 20 MG tablet; 3 tabs poqday 1-2, 2 tabs poqday 3-4, 1 tab poqday 5-6  Dispense: 12 tablet; Refill: 0  05/02/13 She was seen by ortho yesterday.  They called and spoke to Dr. Jeanice Lim.  Per her phone note: Pt was seen by orthopedics Hoyte (PA) today, concerned about right sided thoracic pain. After evaluation found to have wheezing and rhonchi, he was concerned about worsening Pneumonia or COPD and wanted to alert [place].  He does not think this is [facility]. Lorain Childes she was there for routine follow-up.  Will schedule CXR in AM.  The CXR this am revealed: 1. Emphysema.  2. Vague opacity in the left upper lung field may simply be due to  superimposed vascular and bony structures. Recommend attention to  this area on followup chest x-ray.  3. Stable cardiomegaly.  Patient reports that she's having occasional attacks of shortness of breath and wheezing. She's been using the albuterol 2 puffs twice a day as needed.  She thinks she has been taking the Advair but upon further questioning I am convinced that she has not been on her Advair. She continues to smoke approximately one pack per day. She denies any fevers chills. She denies any hemoptysis. She only has trace mucus production with cough. She denies any pleurisy  Past Medical History  Diagnosis Date  . Low back pain   . Bronchitis   . Chronic hoarseness   . Sleep apnea, obstructive   . Renal artery stenosis   . PVD (peripheral vascular disease)   . Hypercholesteremia   . Pain in limb   . COPD (chronic obstructive pulmonary disease)   . Benign essential hypertension   . Atherosclerosis     coronary vessel type  . Coronary artery disease   . Diverticulosis   . History of GI diverticular bleed   . History of esophagitis   . H. pylori infection   . Altered mental status   . Pneumonia   . TIA (transient ischemic attack) 07/2010  . Arthritis   . GERD (gastroesophageal reflux disease)   . History of blood clots   . Tubular adenoma 2012  . Heart murmur   . CHF (congestive heart failure)   . Anginal pain   . Myocardial infarct 1990's    "I've only had one" (12/06/2012)  . Exertional shortness of breath   . Daily headache   . Anxiety   . Gastric ulcer 07/2012  large; required clipping Hattie Perch 08/23/2012 (12/06/2012)   Current Outpatient Prescriptions on File Prior to Visit  Medication Sig Dispense Refill  . acetaminophen (TYLENOL) 500 MG tablet Take 1,000 mg by mouth every 4 (four) hours as needed (For headache).      Marland Kitchen albuterol (PROAIR HFA) 108 (90 BASE) MCG/ACT inhaler Inhale 2 puffs into the lungs every 4 (four) hours as needed for wheezing.  1 Inhaler  6  . ALPRAZolam (XANAX) 0.5 MG tablet TAKE 1 TABLET BY MOUTH EVERY DAY AS NEEDED -- DUE 03/27/13  30 tablet  0  . atorvastatin (LIPITOR) 80 MG tablet TAKE 1 TABLET AT BEDTIME.  30 tablet  7  . clopidogrel (PLAVIX) 75 MG tablet Take 75 mg by mouth daily.       . fluticasone-salmeterol  (ADVAIR HFA) 45-21 MCG/ACT inhaler Inhale 2 puffs into the lungs 2 (two) times daily.      Marland Kitchen gabapentin (NEURONTIN) 300 MG capsule Take 300 mg by mouth at bedtime.      . isosorbide mononitrate (IMDUR) 60 MG 24 hr tablet Take 1 tablet (60 mg total) by mouth daily.  30 tablet  6  . levofloxacin (LEVAQUIN) 750 MG tablet Take 1 tablet (750 mg total) by mouth daily. X 11 days  11 tablet  0  . losartan (COZAAR) 50 MG tablet Take 50 mg by mouth daily.      . metoCLOPramide (REGLAN) 5 MG tablet Take 1 tablet (5 mg total) by mouth 2 (two) times daily.  60 tablet  1  . metoprolol tartrate (LOPRESSOR) 25 MG tablet Take 0.5 tablets (12.5 mg total) by mouth 2 (two) times daily.      Marland Kitchen morphine (MS CONTIN) 30 MG 12 hr tablet Take 30 mg by mouth. Every 12 hours as needed for pain      . Nutritional Supplements (COMPLETE PROTEIN/VITAMIN SHAKE PO) Take 2-3 Bottles by mouth daily.      . ondansetron (ZOFRAN) 8 MG tablet Take 1 tab every 8 hours for nausea.  50 tablet  0  . oxyCODONE-acetaminophen (PERCOCET) 10-325 MG per tablet Take 1 tablet by mouth 5 (five) times daily as needed for pain.      . pantoprazole (PROTONIX) 40 MG tablet Take 1 tab twice daily  60 tablet  1  . sucralfate (CARAFATE) 1 G tablet Take 1 tablet (1 g total) by mouth 4 (four) times daily.  120 tablet  1   No current facility-administered medications on file prior to visit.   No Known Allergies History   Social History  . Marital Status: Divorced    Spouse Name: N/A    Number of Children: 6  . Years of Education: N/A   Occupational History  . RETIRED    Social History Main Topics  . Smoking status: Current Every Day Smoker -- 0.50 packs/day for 60 years    Types: Cigarettes  . Smokeless tobacco: Never Used  . Alcohol Use: No     Comment: occasional  . Drug Use: No  . Sexual Activity: No   Other Topics Concern  . Not on file   Social History Narrative   4-6 cups of coffee daily       Review of Systems  All other  systems reviewed and are negative.       Objective:   Physical Exam  Vitals reviewed. Constitutional: She appears well-developed and well-nourished.  Neck: Neck supple. No JVD present. No thyromegaly present.  Cardiovascular: Normal rate, regular rhythm and normal  heart sounds.   No murmur heard. Pulmonary/Chest: Effort normal. She has decreased breath sounds.  Abdominal: Soft. Bowel sounds are normal.  Musculoskeletal: She exhibits no edema.        Assessment & Plan:   1. COPD (chronic obstructive pulmonary disease) Chest x-ray thankfully shows no pneumonia. I believe the patient has stable COPD. Her lung exam is abnormal but this is baseline for this patient. I strongly encouraged smoking cessation. I feel it is only a matter of time before she has her neck COPD exacerbation. I also strongly recommended that her family administer her medications as I believe the patient is easily confused and maybe taking the medicines wrong. I will give the patient had flu shot as well as Prevnar 13. She's had a Pneumovax in the past..  I also recommended she resume Advair 2 puffs inhaled twice a day. - fluticasone-salmeterol (ADVAIR HFA) 45-21 MCG/ACT inhaler; Inhale 2 puffs into the lungs 2 (two) times daily.  Dispense: 1 Inhaler; Refill: 11

## 2013-05-02 NOTE — Telephone Encounter (Signed)
Does she need to be seen?

## 2013-05-02 NOTE — Telephone Encounter (Signed)
She has appt with you today at 1145am, she has to go and get CXR first and then come here for appt.

## 2013-05-03 ENCOUNTER — Other Ambulatory Visit: Payer: Self-pay | Admitting: Family Medicine

## 2013-05-03 ENCOUNTER — Encounter (HOSPITAL_COMMUNITY): Payer: Medicare PPO

## 2013-05-03 MED ORDER — FLUTICASONE PROPIONATE 50 MCG/ACT NA SUSP: 2.0000 | Freq: Every day | NASAL | Status: DC

## 2013-05-13 ENCOUNTER — Other Ambulatory Visit: Payer: Self-pay | Admitting: Physician Assistant

## 2013-05-20 ENCOUNTER — Ambulatory Visit (HOSPITAL_COMMUNITY): Payer: Medicare PPO

## 2013-05-21 ENCOUNTER — Other Ambulatory Visit: Payer: Self-pay | Admitting: Internal Medicine

## 2013-05-22 ENCOUNTER — Telehealth: Payer: Self-pay | Admitting: Family Medicine

## 2013-05-22 MED ORDER — ONDANSETRON HCL 8 MG PO TABS: ORAL_TABLET | ORAL | Status: DC

## 2013-05-22 NOTE — Telephone Encounter (Signed)
Pts daughter is calling because her mother is needing a refill on her [place] medication [facility] [place] Call back number for daughter is [phone]

## 2013-05-22 NOTE — Telephone Encounter (Signed)
Rx Refilled  

## 2013-05-27 ENCOUNTER — Other Ambulatory Visit: Payer: Self-pay | Admitting: Family Medicine

## 2013-05-27 NOTE — Telephone Encounter (Signed)
Last Rf 10/31 #30.  Last OV 11/6  OK refill?

## 2013-05-28 ENCOUNTER — Other Ambulatory Visit: Payer: Self-pay | Admitting: Family Medicine

## 2013-05-28 NOTE — Telephone Encounter (Signed)
ok 

## 2013-05-28 NOTE — Telephone Encounter (Signed)
RX already faxed to pharmacy

## 2013-06-03 ENCOUNTER — Other Ambulatory Visit: Payer: Self-pay | Admitting: Internal Medicine

## 2013-06-11 ENCOUNTER — Telehealth: Payer: Self-pay | Admitting: Family Medicine

## 2013-06-11 ENCOUNTER — Other Ambulatory Visit: Payer: Self-pay | Admitting: Cardiovascular Disease

## 2013-06-11 ENCOUNTER — Ambulatory Visit: Payer: 59 | Admitting: Family Medicine

## 2013-06-11 NOTE — Telephone Encounter (Signed)
Pt daugther is calling because they had to cancel apt for today the mother is sick and they are wanting to know if anything can be called in Call back number is [phone]

## 2013-06-11 NOTE — Telephone Encounter (Signed)
Rx was sent to pharmacy electronically. 

## 2013-06-12 NOTE — Telephone Encounter (Signed)
Spoke to pt and she is wanting an antibiotic called in. Her symptoms are deep cough, chest congestion and clear runny nose. No fever. ??

## 2013-06-12 NOTE — Telephone Encounter (Signed)
She can take robitussin or mucinex and use her inhaler I would not recommend antibiotic without office visit

## 2013-06-12 NOTE — Telephone Encounter (Signed)
.  Patient's dtr aware and will get otc meds and if no better or gets worse will call to make an appt.

## 2013-06-18 ENCOUNTER — Ambulatory Visit: Payer: 59 | Admitting: Family Medicine

## 2013-06-18 ENCOUNTER — Ambulatory Visit (INDEPENDENT_AMBULATORY_CARE_PROVIDER_SITE_OTHER): Payer: 59 | Admitting: Family Medicine

## 2013-06-18 ENCOUNTER — Encounter: Payer: Self-pay | Admitting: Family Medicine

## 2013-06-18 VITALS — BP 110/72 | HR 58 | Temp 97.7°F | Resp 18 | Wt 96.0 lb

## 2013-06-18 DIAGNOSIS — J441 Chronic obstructive pulmonary disease with (acute) exacerbation: Secondary | ICD-10-CM

## 2013-06-18 MED ORDER — LEVOFLOXACIN 500 MG PO TABS: 500.0000 mg | ORAL_TABLET | Freq: Every day | ORAL | Status: DC

## 2013-06-18 MED ORDER — PREDNISONE 20 MG PO TABS: ORAL_TABLET | ORAL | Status: DC

## 2013-06-18 NOTE — Progress Notes (Signed)
Subjective:    Patient ID: Kathy Marshall, female    DOB: Dec 28, 1934, 78 y.o.   MRN: [id number]  HPI Patient symptoms began approximately one week ago with rhinorrhea and sinus congestion. It subsequently progressed into a cough productive of yellow and green sputum. She now has moderate to severe chest congestion, constant cough, wheezing, and increasing shortness of breath. She denies any fever or chills. She has a history of severe COPD and pneumonia. She has been using her albuterol inhaler every 6 hours as needed for wheezing. She had been using Mucinex over-the-counter with minimal benefit. Past Medical History  Diagnosis Date  . Low back pain   . Bronchitis   . Chronic hoarseness   . Sleep apnea, obstructive   . Renal artery stenosis   . PVD (peripheral vascular disease)   . Hypercholesteremia   . Pain in limb   . COPD (chronic obstructive pulmonary disease)   . Benign essential hypertension   . Atherosclerosis     coronary vessel type  . Coronary artery disease   . Diverticulosis   . History of GI diverticular bleed   . History of esophagitis   . H. pylori infection   . Altered mental status   . Pneumonia   . TIA (transient ischemic attack) 07/2010  . Arthritis   . GERD (gastroesophageal reflux disease)   . History of blood clots   . Tubular adenoma 2012  . Heart murmur   . CHF (congestive heart failure)   . Anginal pain   . Myocardial infarct 1990's    "I've only had one" (12/06/2012)  . Exertional shortness of breath   . Daily headache   . Anxiety   . Gastric ulcer 07/2012    large; required clipping Hattie Perch 08/23/2012 (12/06/2012)   Current Outpatient Prescriptions on File Prior to Visit  Medication Sig Dispense Refill  . acetaminophen (TYLENOL) 500 MG tablet Take 1,000 mg by mouth every 4 (four) hours as needed (For headache).      Marland Kitchen albuterol (PROAIR HFA) 108 (90 BASE) MCG/ACT inhaler Inhale 2 puffs into the lungs every 4 (four) hours as needed for  wheezing.  1 Inhaler  6  . ALPRAZolam (XANAX) 0.5 MG tablet TAKE 1 TABLET BY MOUTH EVERY DAY AS NEEDED  30 tablet  0  . atorvastatin (LIPITOR) 80 MG tablet TAKE 1 TABLET AT BEDTIME.  30 tablet  7  . clopidogrel (PLAVIX) 75 MG tablet Take 75 mg by mouth daily.       . fluticasone (FLONASE) 50 MCG/ACT nasal spray Place 2 sprays into both nostrils daily.  16 g  6  . fluticasone-salmeterol (ADVAIR HFA) 45-21 MCG/ACT inhaler Inhale 2 puffs into the lungs 2 (two) times daily.  1 Inhaler  11  . gabapentin (NEURONTIN) 300 MG capsule Take 300 mg by mouth at bedtime.      . isosorbide mononitrate (IMDUR) 60 MG 24 hr tablet TAKE 1 TABLET (60 MG TOTAL) BY MOUTH DAILY.  30 tablet  6  . losartan (COZAAR) 50 MG tablet Take 50 mg by mouth daily.      . metoCLOPramide (REGLAN) 5 MG tablet Take 1 tablet (5 mg total) by mouth 2 (two) times daily.  60 tablet  1  . metoprolol tartrate (LOPRESSOR) 25 MG tablet Take 0.5 tablets (12.5 mg total) by mouth 2 (two) times daily.      Marland Kitchen morphine (MS CONTIN) 30 MG 12 hr tablet Take 30 mg by mouth. Every 12 hours as  needed for pain      . Nutritional Supplements (COMPLETE PROTEIN/VITAMIN SHAKE PO) Take 2-3 Bottles by mouth daily.      . ondansetron (ZOFRAN) 8 MG tablet Take 1 tab every 8 hours for nausea.  50 tablet  0  . oxyCODONE-acetaminophen (PERCOCET) 10-325 MG per tablet Take 1 tablet by mouth 5 (five) times daily as needed for pain.      . pantoprazole (PROTONIX) 40 MG tablet TAKE 1 TABLET BY MOUTH TWICE A DAY  60 tablet  1  . sucralfate (CARAFATE) 1 G tablet Take 1 tablet (1 g total) by mouth 4 (four) times daily.  120 tablet  1   No current facility-administered medications on file prior to visit.   No Known Allergies History   Social History  . Marital Status: Divorced    Spouse Name: N/A    Number of Children: 6  . Years of Education: N/A   Occupational History  . RETIRED    Social History Main Topics  . Smoking status: Current Every Day Smoker -- 0.50  packs/day for 60 years    Types: Cigarettes  . Smokeless tobacco: Never Used  . Alcohol Use: No     Comment: occasional  . Drug Use: No  . Sexual Activity: No   Other Topics Concern  . Not on file   Social History Narrative   4-6 cups of coffee daily       Review of Systems  All other systems reviewed and are negative.       Objective:   Physical Exam  Vitals reviewed. HENT:  Right Ear: External ear normal.  Left Ear: External ear normal.  Nose: Nose normal.  Mouth/Throat: Oropharynx is clear and moist. No oropharyngeal exudate.  Eyes: Conjunctivae are normal.  Neck: Neck supple.  Cardiovascular: Normal rate, regular rhythm and normal heart sounds.   Pulmonary/Chest: Effort normal. She has wheezes. She has rales.  Lymphadenopathy:    She has no cervical adenopathy.          Assessment & Plan:  1. Bronchitis, chronic obstructive, with exacerbation The patient has a COPD exacerbation. Begin prednisone. 60 mg on day 1 and day 2, 40 mg on day 3 and 4, 20 mg on day 5 and 6. Start Levaquin 500 mg by mouth daily for 7 days. Continue to use albuterol 2 puffs inhaled every 4-6 hours as needed for wheezing. I recommended smoking cessation. I also recommended Mucinex 400 mg by mouth every 4 hours as needed for chest congestion. Recheck next week if no better or go to the emergency room over Christmas if worse. - predniSONE (DELTASONE) 20 MG tablet; 3 tabs poqday 1-2, 2 tabs poqday 3-4, 1 tab poqday 5-6  Dispense: 12 tablet; Refill: 0 - levofloxacin (LEVAQUIN) 500 MG tablet; Take 1 tablet (500 mg total) by mouth daily.  Dispense: 7 tablet; Refill: 0

## 2013-06-23 ENCOUNTER — Other Ambulatory Visit: Payer: Self-pay | Admitting: Physician Assistant

## 2013-06-23 ENCOUNTER — Other Ambulatory Visit (HOSPITAL_COMMUNITY): Payer: Self-pay | Admitting: Physician Assistant

## 2013-06-23 ENCOUNTER — Other Ambulatory Visit: Payer: Self-pay | Admitting: Family Medicine

## 2013-06-23 ENCOUNTER — Other Ambulatory Visit: Payer: Self-pay | Admitting: Internal Medicine

## 2013-06-24 ENCOUNTER — Other Ambulatory Visit: Payer: Self-pay | Admitting: Family Medicine

## 2013-06-24 ENCOUNTER — Other Ambulatory Visit: Payer: Self-pay | Admitting: Internal Medicine

## 2013-06-24 MED ORDER — ALPRAZOLAM 0.5 MG PO TABS: ORAL_TABLET | ORAL | Status: DC

## 2013-07-15 ENCOUNTER — Emergency Department (HOSPITAL_COMMUNITY)
Admission: EM | Admit: 2013-07-15 | Discharge: 2013-07-15 | Discharge status: Against Medical Advice | Payer: Medicare PPO | Attending: Emergency Medicine | Admitting: Emergency Medicine

## 2013-07-15 ENCOUNTER — Telehealth: Payer: Self-pay | Admitting: Family Medicine

## 2013-07-15 ENCOUNTER — Encounter (HOSPITAL_COMMUNITY): Payer: Self-pay | Admitting: Emergency Medicine

## 2013-07-15 DIAGNOSIS — Z8701 Personal history of pneumonia (recurrent): Secondary | ICD-10-CM | POA: Insufficient documentation

## 2013-07-15 DIAGNOSIS — R011 Cardiac murmur, unspecified: Secondary | ICD-10-CM | POA: Insufficient documentation

## 2013-07-15 DIAGNOSIS — Z7902 Long term (current) use of antithrombotics/antiplatelets: Secondary | ICD-10-CM | POA: Insufficient documentation

## 2013-07-15 DIAGNOSIS — G4733 Obstructive sleep apnea (adult) (pediatric): Secondary | ICD-10-CM | POA: Insufficient documentation

## 2013-07-15 DIAGNOSIS — R51 Headache: Secondary | ICD-10-CM | POA: Insufficient documentation

## 2013-07-15 DIAGNOSIS — F17200 Nicotine dependence, unspecified, uncomplicated: Secondary | ICD-10-CM | POA: Insufficient documentation

## 2013-07-15 DIAGNOSIS — I701 Atherosclerosis of renal artery: Secondary | ICD-10-CM | POA: Insufficient documentation

## 2013-07-15 DIAGNOSIS — Z8709 Personal history of other diseases of the respiratory system: Secondary | ICD-10-CM | POA: Insufficient documentation

## 2013-07-15 DIAGNOSIS — Z79899 Other long term (current) drug therapy: Secondary | ICD-10-CM | POA: Insufficient documentation

## 2013-07-15 DIAGNOSIS — F411 Generalized anxiety disorder: Secondary | ICD-10-CM | POA: Insufficient documentation

## 2013-07-15 DIAGNOSIS — Z8619 Personal history of other infectious and parasitic diseases: Secondary | ICD-10-CM | POA: Insufficient documentation

## 2013-07-15 DIAGNOSIS — Z86718 Personal history of other venous thrombosis and embolism: Secondary | ICD-10-CM | POA: Insufficient documentation

## 2013-07-15 DIAGNOSIS — Z8719 Personal history of other diseases of the digestive system: Secondary | ICD-10-CM | POA: Insufficient documentation

## 2013-07-15 DIAGNOSIS — I2510 Atherosclerotic heart disease of native coronary artery without angina pectoris: Secondary | ICD-10-CM | POA: Insufficient documentation

## 2013-07-15 DIAGNOSIS — M545 Low back pain, unspecified: Secondary | ICD-10-CM | POA: Insufficient documentation

## 2013-07-15 DIAGNOSIS — E7800 Pure hypercholesterolemia, unspecified: Secondary | ICD-10-CM | POA: Insufficient documentation

## 2013-07-15 DIAGNOSIS — I509 Heart failure, unspecified: Secondary | ICD-10-CM | POA: Insufficient documentation

## 2013-07-15 DIAGNOSIS — Z8669 Personal history of other diseases of the nervous system and sense organs: Secondary | ICD-10-CM | POA: Insufficient documentation

## 2013-07-15 DIAGNOSIS — I252 Old myocardial infarction: Secondary | ICD-10-CM | POA: Insufficient documentation

## 2013-07-15 DIAGNOSIS — J449 Chronic obstructive pulmonary disease, unspecified: Secondary | ICD-10-CM | POA: Insufficient documentation

## 2013-07-15 DIAGNOSIS — I10 Essential (primary) hypertension: Secondary | ICD-10-CM | POA: Insufficient documentation

## 2013-07-15 DIAGNOSIS — Z8673 Personal history of transient ischemic attack (TIA), and cerebral infarction without residual deficits: Secondary | ICD-10-CM | POA: Insufficient documentation

## 2013-07-15 DIAGNOSIS — J4489 Other specified chronic obstructive pulmonary disease: Secondary | ICD-10-CM | POA: Insufficient documentation

## 2013-07-15 DIAGNOSIS — K219 Gastro-esophageal reflux disease without esophagitis: Secondary | ICD-10-CM | POA: Insufficient documentation

## 2013-07-15 DIAGNOSIS — R269 Unspecified abnormalities of gait and mobility: Secondary | ICD-10-CM | POA: Insufficient documentation

## 2013-07-15 DIAGNOSIS — M129 Arthropathy, unspecified: Secondary | ICD-10-CM | POA: Insufficient documentation

## 2013-07-15 DIAGNOSIS — Z9861 Coronary angioplasty status: Secondary | ICD-10-CM | POA: Insufficient documentation

## 2013-07-15 DIAGNOSIS — I739 Peripheral vascular disease, unspecified: Secondary | ICD-10-CM | POA: Insufficient documentation

## 2013-07-15 LAB — CBC
HEMATOCRIT: 40 % (ref 36.0–46.0)
Hemoglobin: 13.3 g/dL (ref 12.0–15.0)
MCH: 32.4 pg (ref 26.0–34.0)
MCHC: 33.3 g/dL (ref 30.0–36.0)
MCV: 97.6 fL (ref 78.0–100.0)
PLATELETS: 194 10*3/uL (ref 150–400)
RBC: 4.1 MIL/uL (ref 3.87–5.11)
RDW: 13 % (ref 11.5–15.5)
WBC: 5.3 10*3/uL (ref 4.0–10.5)

## 2013-07-15 LAB — BASIC METABOLIC PANEL
BUN: 13 mg/dL (ref 6–23)
CO2: 27 mEq/L (ref 19–32)
Calcium: 8.9 mg/dL (ref 8.4–10.5)
Chloride: 108 mEq/L (ref 96–112)
Creatinine, Ser: 0.71 mg/dL (ref 0.50–1.10)
GFR calc non Af Amer: 82 mL/min — ABNORMAL LOW (ref >90)
GLUCOSE: 105 mg/dL — AB (ref 70–99)
Potassium: 4.3 mEq/L (ref 3.7–5.3)
Sodium: 144 mEq/L (ref 137–147)

## 2013-07-15 NOTE — ED Notes (Signed)
Pt c/o of waking up with an occipital headache.  At 1 pm pt felt her L foot evert for several steps.  Her daughter made her lay in bed and call he Dr.  PCP stated that he was full and she should come to the hospital in case she was having a stroke.  No deficits at this time.

## 2013-07-15 NOTE — Telephone Encounter (Signed)
Kathy Marshall has a question about her mothers leg she is wondering if she needs to bring her in or not Call back number is [phone]

## 2013-07-15 NOTE — Telephone Encounter (Signed)
Spoke to daughter and the pt is having a severe headache and her left leg is turning outward. She has just started this at 2 pm and wants to know what to do?  Told her to take her to ER to R/O CVA. Daughter agreed and will take her to er now.

## 2013-07-15 NOTE — ED Notes (Signed)
Pt decided to leave due to wait, encouraged to stay

## 2013-07-25 ENCOUNTER — Other Ambulatory Visit: Payer: Self-pay | Admitting: *Deleted

## 2013-07-25 ENCOUNTER — Other Ambulatory Visit: Payer: Self-pay | Admitting: Family Medicine

## 2013-07-25 ENCOUNTER — Other Ambulatory Visit: Payer: Self-pay | Admitting: Physician Assistant

## 2013-07-25 MED ORDER — CLOPIDOGREL BISULFATE 75 MG PO TABS: 75.0000 mg | ORAL_TABLET | Freq: Every day | ORAL | Status: DC

## 2013-07-25 NOTE — Telephone Encounter (Signed)
Rx was sent to pharmacy electronically. 

## 2013-07-25 NOTE — Telephone Encounter (Signed)
?   OK to Refill  

## 2013-07-25 NOTE — Telephone Encounter (Signed)
ok 

## 2013-08-08 ENCOUNTER — Other Ambulatory Visit: Payer: Self-pay | Admitting: Internal Medicine

## 2013-08-10 ENCOUNTER — Other Ambulatory Visit: Payer: Self-pay | Admitting: Family Medicine

## 2013-08-10 DIAGNOSIS — M545 Low back pain, unspecified: Secondary | ICD-10-CM

## 2013-08-10 DIAGNOSIS — I2510 Atherosclerotic heart disease of native coronary artery without angina pectoris: Secondary | ICD-10-CM

## 2013-08-10 DIAGNOSIS — K219 Gastro-esophageal reflux disease without esophagitis: Secondary | ICD-10-CM

## 2013-08-13 ENCOUNTER — Emergency Department (HOSPITAL_COMMUNITY): Payer: Medicare PPO

## 2013-08-13 ENCOUNTER — Emergency Department (HOSPITAL_COMMUNITY)
Admission: EM | Admit: 2013-08-13 | Discharge: 2013-08-13 | Disposition: A | Discharge status: Home / Self Care | Payer: Medicare PPO | Attending: Emergency Medicine | Admitting: Emergency Medicine

## 2013-08-13 ENCOUNTER — Encounter (HOSPITAL_COMMUNITY): Payer: Self-pay | Admitting: Emergency Medicine

## 2013-08-13 DIAGNOSIS — M129 Arthropathy, unspecified: Secondary | ICD-10-CM | POA: Insufficient documentation

## 2013-08-13 DIAGNOSIS — F17200 Nicotine dependence, unspecified, uncomplicated: Secondary | ICD-10-CM | POA: Insufficient documentation

## 2013-08-13 DIAGNOSIS — Z87448 Personal history of other diseases of urinary system: Secondary | ICD-10-CM | POA: Insufficient documentation

## 2013-08-13 DIAGNOSIS — J4489 Other specified chronic obstructive pulmonary disease: Secondary | ICD-10-CM | POA: Insufficient documentation

## 2013-08-13 DIAGNOSIS — R011 Cardiac murmur, unspecified: Secondary | ICD-10-CM | POA: Insufficient documentation

## 2013-08-13 DIAGNOSIS — I10 Essential (primary) hypertension: Secondary | ICD-10-CM | POA: Insufficient documentation

## 2013-08-13 DIAGNOSIS — Z8701 Personal history of pneumonia (recurrent): Secondary | ICD-10-CM | POA: Insufficient documentation

## 2013-08-13 DIAGNOSIS — I252 Old myocardial infarction: Secondary | ICD-10-CM | POA: Insufficient documentation

## 2013-08-13 DIAGNOSIS — Z7902 Long term (current) use of antithrombotics/antiplatelets: Secondary | ICD-10-CM | POA: Insufficient documentation

## 2013-08-13 DIAGNOSIS — J449 Chronic obstructive pulmonary disease, unspecified: Secondary | ICD-10-CM | POA: Insufficient documentation

## 2013-08-13 DIAGNOSIS — E7800 Pure hypercholesterolemia, unspecified: Secondary | ICD-10-CM | POA: Insufficient documentation

## 2013-08-13 DIAGNOSIS — I509 Heart failure, unspecified: Secondary | ICD-10-CM | POA: Insufficient documentation

## 2013-08-13 DIAGNOSIS — J209 Acute bronchitis, unspecified: Secondary | ICD-10-CM | POA: Insufficient documentation

## 2013-08-13 DIAGNOSIS — Z9861 Coronary angioplasty status: Secondary | ICD-10-CM | POA: Insufficient documentation

## 2013-08-13 DIAGNOSIS — Z8673 Personal history of transient ischemic attack (TIA), and cerebral infarction without residual deficits: Secondary | ICD-10-CM | POA: Insufficient documentation

## 2013-08-13 DIAGNOSIS — Z79899 Other long term (current) drug therapy: Secondary | ICD-10-CM | POA: Insufficient documentation

## 2013-08-13 DIAGNOSIS — F0390 Unspecified dementia without behavioral disturbance: Secondary | ICD-10-CM | POA: Insufficient documentation

## 2013-08-13 DIAGNOSIS — Z8669 Personal history of other diseases of the nervous system and sense organs: Secondary | ICD-10-CM | POA: Insufficient documentation

## 2013-08-13 DIAGNOSIS — Z8619 Personal history of other infectious and parasitic diseases: Secondary | ICD-10-CM | POA: Insufficient documentation

## 2013-08-13 DIAGNOSIS — I209 Angina pectoris, unspecified: Secondary | ICD-10-CM | POA: Insufficient documentation

## 2013-08-13 DIAGNOSIS — F411 Generalized anxiety disorder: Secondary | ICD-10-CM | POA: Insufficient documentation

## 2013-08-13 DIAGNOSIS — J40 Bronchitis, not specified as acute or chronic: Secondary | ICD-10-CM

## 2013-08-13 DIAGNOSIS — K219 Gastro-esophageal reflux disease without esophagitis: Secondary | ICD-10-CM | POA: Insufficient documentation

## 2013-08-13 DIAGNOSIS — I2510 Atherosclerotic heart disease of native coronary artery without angina pectoris: Secondary | ICD-10-CM | POA: Insufficient documentation

## 2013-08-13 LAB — BASIC METABOLIC PANEL
BUN: 8 mg/dL (ref 6–23)
CHLORIDE: 103 meq/L (ref 96–112)
CO2: 29 meq/L (ref 19–32)
Calcium: 9.6 mg/dL (ref 8.4–10.5)
Creatinine, Ser: 0.65 mg/dL (ref 0.50–1.10)
GFR calc Af Amer: >90 mL/min (ref >90)
GFR calc non Af Amer: 84 mL/min — ABNORMAL LOW (ref >90)
GLUCOSE: 104 mg/dL — AB (ref 70–99)
POTASSIUM: 3.8 meq/L (ref 3.7–5.3)
Sodium: 143 mEq/L (ref 137–147)

## 2013-08-13 LAB — CBC WITH DIFFERENTIAL/PLATELET
Basophils Absolute: 0 10*3/uL (ref 0.0–0.1)
Basophils Relative: 1 % (ref 0–1)
Eosinophils Absolute: 0.3 10*3/uL (ref 0.0–0.7)
Eosinophils Relative: 3 % (ref 0–5)
HCT: 41.5 % (ref 36.0–46.0)
HEMOGLOBIN: 13.9 g/dL (ref 12.0–15.0)
LYMPHS ABS: 2.1 10*3/uL (ref 0.7–4.0)
LYMPHS PCT: 26 % (ref 12–46)
MCH: 32.6 pg (ref 26.0–34.0)
MCHC: 33.5 g/dL (ref 30.0–36.0)
MCV: 97.4 fL (ref 78.0–100.0)
MONOS PCT: 8 % (ref 3–12)
Monocytes Absolute: 0.6 10*3/uL (ref 0.1–1.0)
NEUTROS ABS: 5.2 10*3/uL (ref 1.7–7.7)
NEUTROS PCT: 63 % (ref 43–77)
Platelets: 224 10*3/uL (ref 150–400)
RBC: 4.26 MIL/uL (ref 3.87–5.11)
RDW: 12.6 % (ref 11.5–15.5)
WBC: 8.2 10*3/uL (ref 4.0–10.5)

## 2013-08-13 LAB — URINALYSIS, ROUTINE W REFLEX MICROSCOPIC
Bilirubin Urine: NEGATIVE
Glucose, UA: NEGATIVE mg/dL
HGB URINE DIPSTICK: NEGATIVE
Ketones, ur: NEGATIVE mg/dL
Leukocytes, UA: NEGATIVE
NITRITE: NEGATIVE
PH: 7 (ref 5.0–8.0)
Protein, ur: NEGATIVE mg/dL
SPECIFIC GRAVITY, URINE: 1.009 (ref 1.005–1.030)
Urobilinogen, UA: 1 mg/dL (ref 0.0–1.0)

## 2013-08-13 MED ORDER — SODIUM CHLORIDE 0.9 % IV BOLUS (SEPSIS)
1000.0000 mL | Freq: Once | INTRAVENOUS | Status: AC
  Administered 2013-08-13: 1000 mL via INTRAVENOUS

## 2013-08-13 MED ORDER — AZITHROMYCIN 250 MG PO TABS: ORAL_TABLET | ORAL | Status: DC

## 2013-08-13 NOTE — ED Notes (Signed)
Patient is alert and orientedx4.  Patient was explained discharge instructions and they understood them with no questions.  The patient's daughter, Milly Jakob is taking the  Patient home.

## 2013-08-13 NOTE — ED Notes (Addendum)
Pt in with family c/o generalized weakness over the last few days, states patient has had cough and congestion and recently has been requiring more help to do things and has been falling more at home, unsure of fever, alert and oriented at this time. Also c/o headache since yesterday.

## 2013-08-13 NOTE — ED Provider Notes (Addendum)
CSN: [id number]     Arrival date & time 08/13/13  1402 History   First MD Initiated Contact with Patient 08/13/13 1404     Chief Complaint  Patient presents with  . Weakness     (Consider location/radiation/quality/duration/timing/severity/associated sxs/prior Treatment) HPI .Marland Kitchen... level V caveat for dementia. Generalized weakness for the past several days with associated cough and congestion. No fever or chills.  She has had decreased energy lately. Family is concerned about pneumonia. Patient is a smoker. She has multiple health problems. Severity is mild to moderate.                   Past Medical History  Diagnosis Date  . Low back pain   . Bronchitis   . Chronic hoarseness   . Sleep apnea, obstructive   . Renal artery stenosis   . PVD (peripheral vascular disease)   . Hypercholesteremia   . Pain in limb   . COPD (chronic obstructive pulmonary disease)   . Benign essential hypertension   . Atherosclerosis     coronary vessel type  . Coronary artery disease   . Diverticulosis   . History of GI diverticular bleed   . History of esophagitis   . H. pylori infection   . Altered mental status   . Pneumonia   . TIA (transient ischemic attack) 07/2010  . Arthritis   . GERD (gastroesophageal reflux disease)   . History of blood clots   . Tubular adenoma 2012  . Heart murmur   . CHF (congestive heart failure)   . Anginal pain   . Myocardial infarct 1990's    "I've only had one" (12/06/2012)  . Exertional shortness of breath   . Daily headache   . Anxiety   . Gastric ulcer 07/2012    large; required clipping Archie Endo 08/23/2012 (12/06/2012)   Past Surgical History  Procedure Laterality Date  . Oophorectomy    . Coronary angioplasty with stent placement  2006    Cypher stent to RCA. On cath in 2008 stent described as widely patent.   . Esophagogastroduodenoscopy N/A 08/21/2012    Procedure: ESOPHAGOGASTRODUODENOSCOPY (EGD);  Surgeon: Lafayette Dragon, MD;  Location: [facility] ENDOSCOPY;   Service: Endoscopy;  Laterality: N/A;  . Vaginal hysterectomy    . Esophagogastroduodenoscopy N/A 12/07/2012    Procedure: ESOPHAGOGASTRODUODENOSCOPY (EGD);  Surgeon: Gatha Mayer, MD;  Location: [facility] ENDOSCOPY;  Service: Endoscopy;  Laterality: N/A;   Family History  Problem Relation Age of Onset  . Heart disease Mother   . Stroke Mother   . Stomach cancer Mother   . Heart disease Father   . Stroke Father   . Heart disease Sister   . Colon cancer Neg Hx   . Lung cancer Sister    History  Substance Use Topics  . Smoking status: Current Every Day Smoker -- 0.50 packs/day for 60 years    Types: Cigarettes  . Smokeless tobacco: Never Used  . Alcohol Use: No     Comment: occasional   OB History   Grav Para Term Preterm Abortions TAB SAB Ect Mult Living                 Review of Systems  Unable to perform ROS: Dementia      Allergies  Review of patient's allergies indicates no known allergies.  Home Medications   Current Outpatient Rx  Name  Route  Sig  Dispense  Refill  . albuterol (PROAIR HFA) 108 (90 BASE) MCG/ACT  inhaler   Inhalation   Inhale 2 puffs into the lungs every 4 (four) hours as needed for wheezing.   1 Inhaler   6   . ALPRAZolam (XANAX) 0.5 MG tablet   Oral   Take 0.5 mg by mouth 2 (two) times daily as needed for anxiety. Every night at bedtime.  Can take during the day if needed.         Marland Kitchen atorvastatin (LIPITOR) 80 MG tablet   Oral   Take 80 mg by mouth at bedtime.         . clopidogrel (PLAVIX) 75 MG tablet   Oral   Take 1 tablet (75 mg total) by mouth daily.   90 tablet   1     Restarted at last cardiology office visit in June  ...   . fluticasone (FLONASE) 50 MCG/ACT nasal spray   Each Nare   Place 2 sprays into both nostrils 2 (two) times daily as needed for allergies or rhinitis.         . fluticasone-salmeterol (ADVAIR HFA) 45-21 MCG/ACT inhaler   Inhalation   Inhale 2 puffs into the lungs 2 (two) times daily as needed  (wheezing and shortness of breath).         . gabapentin (NEURONTIN) 300 MG capsule   Oral   Take 300 mg by mouth at bedtime.         Marland Kitchen ibuprofen (ADVIL,MOTRIN) 200 MG tablet   Oral   Take 400-600 mg by mouth every 6 (six) hours as needed for moderate pain.         . isosorbide mononitrate (IMDUR) 60 MG 24 hr tablet   Oral   Take 60 mg by mouth daily.         Marland Kitchen losartan (COZAAR) 50 MG tablet   Oral   Take 50 mg by mouth daily.         . metoCLOPramide (REGLAN) 5 MG tablet   Oral   Take 5 mg by mouth 2 (two) times daily.         . metoprolol tartrate (LOPRESSOR) 25 MG tablet   Oral   Take 0.5 tablets (12.5 mg total) by mouth 2 (two) times daily.         Marland Kitchen morphine (MS CONTIN) 30 MG 12 hr tablet   Oral   Take 30 mg by mouth. Every 12 hours as needed for pain         . Nutritional Supplements (COMPLETE PROTEIN/VITAMIN SHAKE PO)   Oral   Take 2-3 Bottles by mouth daily.         . ondansetron (ZOFRAN) 8 MG tablet      Take 1 tab every 8 hours for nausea.   50 tablet   0   . oxyCODONE-acetaminophen (PERCOCET) 10-325 MG per tablet   Oral   Take 1 tablet by mouth 5 (five) times daily as needed for pain.         . pantoprazole (PROTONIX) 40 MG tablet   Oral   Take 40 mg by mouth daily.         Marland Kitchen topiramate (TOPAMAX) 25 MG tablet   Oral   Take 25 mg by mouth 4 (four) times daily as needed (headach).         Marland Kitchen azithromycin (ZITHROMAX Z-PAK) 250 MG tablet      2 po day one, then 1 daily x 4 days   5 tablet   0    BP 164/85  Pulse 55  Temp(Src) 98.3 F (36.8 C) (Oral)  Resp 18  SpO2 91% Physical Exam  Nursing note and vitals reviewed. Constitutional: She is oriented to person, place, and time.  No respiratory distress, frail  HENT:  Head: Normocephalic and atraumatic.  Eyes: Conjunctivae and EOM are normal. Pupils are equal, round, and reactive to light.  Neck: Normal range of motion. Neck supple.  Cardiovascular: Normal rate,  regular rhythm and normal heart sounds.   Pulmonary/Chest: Effort normal and breath sounds normal.  Abdominal: Soft. Bowel sounds are normal.  Musculoskeletal: Normal range of motion.  Neurological: She is alert and oriented to person, place, and time.  Skin: Skin is warm and dry.  Psychiatric:  Slightly demented    ED Course  Procedures (including critical care time) Labs Review Labs Reviewed  BASIC METABOLIC PANEL - Abnormal; Notable for the following:    Glucose, Bld 104 (*)    GFR calc non Af Amer 84 (*)    All other components within normal limits  CBC WITH DIFFERENTIAL  URINALYSIS, ROUTINE W REFLEX MICROSCOPIC   Imaging Review Dg Chest 2 View  08/13/2013   CLINICAL DATA:  Left-sided chest discomfort, history of MI and tobacco use  EXAM: CHEST  2 VIEW  COMPARISON:  DG CHEST 2 VIEW dated 05/02/2013  FINDINGS: The lungs are hyperinflated. There is no focal infiltrate. The subtle parenchymal density in the left upper lobe noted on the previous study is no longer evident. The cardiac silhouette is mildly enlarged. Coronary artery calcification versus stent is present over the right aspect of the cardiac silhouette anteriorly. The pulmonary vascularity is not engorged. The mediastinum is normal in width. There is mild tortuosity of the descending thoracic aorta. The observed portions of the bony thorax exhibit no acute abnormalities.  IMPRESSION: 1. There is hyperinflation consistent with COPD. There is no evidence of pneumonia nor pleural effusion. No pulmonary parenchymal nodules or masses are demonstrated. 2. There is mild enlargement of the cardiac silhouette today as compared to the previous study. No pulmonary vascular congestion is demonstrated.   Electronically Signed   By: David  Martinique   On: 08/13/2013 14:48    EKG Interpretation   None       MDM   Final diagnoses:  Bronchitis    Screening labs, urinalysis, chest x-ray all normal. Discussed findings with the patient,  her daughter, her son-in-law. Will prescribe Zithromax secondary to her COPD, smoking history, possibility of early bronchitis or COPD exacerbation.  She will get primary care followup this week.    Nat Christen, MD 08/13/13 [place], MD 08/18/13 2107

## 2013-08-13 NOTE — ED Notes (Signed)
Patient transported to X-ray 

## 2013-08-13 NOTE — Discharge Instructions (Signed)
Tests were good.  Increase fluids.  Rx for antibiotic.  Follow up your dr

## 2013-08-13 NOTE — ED Notes (Signed)
Patient back to Pod E 39 from  X-ray.

## 2013-08-18 ENCOUNTER — Other Ambulatory Visit: Payer: Self-pay | Admitting: Internal Medicine

## 2013-08-19 ENCOUNTER — Encounter: Payer: Self-pay | Admitting: Family Medicine

## 2013-08-19 ENCOUNTER — Ambulatory Visit (INDEPENDENT_AMBULATORY_CARE_PROVIDER_SITE_OTHER): Payer: 59 | Admitting: Family Medicine

## 2013-08-19 VITALS — BP 110/58 | HR 78 | Temp 97.6°F | Resp 18 | Ht 62.0 in | Wt 94.0 lb

## 2013-08-19 DIAGNOSIS — F03918 Unspecified dementia, unspecified severity, with other behavioral disturbance: Secondary | ICD-10-CM

## 2013-08-19 DIAGNOSIS — Z09 Encounter for follow-up examination after completed treatment for conditions other than malignant neoplasm: Secondary | ICD-10-CM

## 2013-08-19 DIAGNOSIS — F0391 Unspecified dementia with behavioral disturbance: Secondary | ICD-10-CM

## 2013-08-19 MED ORDER — DONEPEZIL HCL 5 MG PO TABS: 5.0000 mg | ORAL_TABLET | Freq: Every day | ORAL | Status: DC

## 2013-08-19 NOTE — Progress Notes (Signed)
Subjective:    Patient ID: Kathy Marshall, female    DOB: 1934/10/14, 79 y.o.   MRN: [id number]  HPI Patient has a history of mild memory loss. She has a history of delirium and encephalopathy due to pneumonia in the past. Her mild memory loss and delirium is also confounded by the use of Xanax at night as well as her pain medication. The patient has been trying to limit her pain medication and Xanax use the cause of this. However recently she developed an upper respiratory tract infection. Thereafter she became extremely confused. She was hallucinating. She was delirious. Her family to the hospital where chest x-ray was normal, CMP was normal, CBC was normal, and urinalysis was normal. Gradually as the upper respiratory tract infection improved the patient's symptoms have now returned to baseline. At present she is alert and oriented x3 with no evidence of confusion.  She is here today for followup. Past Medical History  Diagnosis Date  . Low back pain   . Bronchitis   . Chronic hoarseness   . Sleep apnea, obstructive   . Renal artery stenosis   . PVD (peripheral vascular disease)   . Hypercholesteremia   . Pain in limb   . COPD (chronic obstructive pulmonary disease)   . Benign essential hypertension   . Atherosclerosis     coronary vessel type  . Coronary artery disease   . Diverticulosis   . History of GI diverticular bleed   . History of esophagitis   . H. pylori infection   . Altered mental status   . Pneumonia   . TIA (transient ischemic attack) 07/2010  . Arthritis   . GERD (gastroesophageal reflux disease)   . History of blood clots   . Tubular adenoma 2012  . Heart murmur   . CHF (congestive heart failure)   . Anginal pain   . Myocardial infarct 1990's    "I've only had one" (12/06/2012)  . Exertional shortness of breath   . Daily headache   . Anxiety   . Gastric ulcer 07/2012    large; required clipping Archie Endo 08/23/2012 (12/06/2012)   Past Surgical History    Procedure Laterality Date  . Oophorectomy    . Coronary angioplasty with stent placement  2006    Cypher stent to RCA. On cath in 2008 stent described as widely patent.   . Esophagogastroduodenoscopy N/A 08/21/2012    Procedure: ESOPHAGOGASTRODUODENOSCOPY (EGD);  Surgeon: Lafayette Dragon, MD;  Location: [facility] ENDOSCOPY;  Service: Endoscopy;  Laterality: N/A;  . Vaginal hysterectomy    . Esophagogastroduodenoscopy N/A 12/07/2012    Procedure: ESOPHAGOGASTRODUODENOSCOPY (EGD);  Surgeon: Gatha Mayer, MD;  Location: [facility] ENDOSCOPY;  Service: Endoscopy;  Laterality: N/A;   Current Outpatient Prescriptions on File Prior to Visit  Medication Sig Dispense Refill  . albuterol (PROAIR HFA) 108 (90 BASE) MCG/ACT inhaler Inhale 2 puffs into the lungs every 4 (four) hours as needed for wheezing.  1 Inhaler  6  . ALPRAZolam (XANAX) 0.5 MG tablet Take 0.5 mg by mouth 2 (two) times daily as needed for anxiety. Every night at bedtime.  Can take during the day if needed.      Marland Kitchen atorvastatin (LIPITOR) 80 MG tablet Take 80 mg by mouth at bedtime.      . clopidogrel (PLAVIX) 75 MG tablet Take 1 tablet (75 mg total) by mouth daily.  90 tablet  1  . fluticasone (FLONASE) 50 MCG/ACT nasal spray Place 2 sprays into both nostrils  2 (two) times daily as needed for allergies or rhinitis.      . fluticasone-salmeterol (ADVAIR HFA) 45-21 MCG/ACT inhaler Inhale 2 puffs into the lungs 2 (two) times daily as needed (wheezing and shortness of breath).      . gabapentin (NEURONTIN) 300 MG capsule Take 300 mg by mouth at bedtime.      Marland Kitchen ibuprofen (ADVIL,MOTRIN) 200 MG tablet Take 400-600 mg by mouth every 6 (six) hours as needed for moderate pain.      . isosorbide mononitrate (IMDUR) 60 MG 24 hr tablet Take 60 mg by mouth daily.      Marland Kitchen losartan (COZAAR) 50 MG tablet Take 50 mg by mouth daily.      . metoCLOPramide (REGLAN) 5 MG tablet TAKE 1 TABLET BY MOUTH TWICE A DAY  60 tablet  1  . metoprolol tartrate (LOPRESSOR) 25 MG tablet  Take 0.5 tablets (12.5 mg total) by mouth 2 (two) times daily.      Marland Kitchen morphine (MS CONTIN) 30 MG 12 hr tablet Take 30 mg by mouth. Every 12 hours as needed for pain      . Nutritional Supplements (COMPLETE PROTEIN/VITAMIN SHAKE PO) Take 2-3 Bottles by mouth daily.      . ondansetron (ZOFRAN) 8 MG tablet Take 1 tab every 8 hours for nausea.  50 tablet  0  . oxyCODONE-acetaminophen (PERCOCET) 10-325 MG per tablet Take 1 tablet by mouth 5 (five) times daily as needed for pain.      . pantoprazole (PROTONIX) 40 MG tablet Take 40 mg by mouth daily.      Marland Kitchen topiramate (TOPAMAX) 25 MG tablet Take 25 mg by mouth 4 (four) times daily as needed (headach).       No current facility-administered medications on file prior to visit.   No Known Allergies History   Social History  . Marital Status: Divorced    Spouse Name: N/A    Number of Children: 6  . Years of Education: N/A   Occupational History  . RETIRED    Social History Main Topics  . Smoking status: Current Every Day Smoker -- 0.50 packs/day for 60 years    Types: Cigarettes  . Smokeless tobacco: Never Used  . Alcohol Use: No     Comment: occasional  . Drug Use: No  . Sexual Activity: No   Other Topics Concern  . Not on file   Social History Narrative   4-6 cups of coffee daily       Review of Systems  All other systems reviewed and are negative.       Objective:   Physical Exam  Vitals reviewed. Constitutional: She is oriented to person, place, and time. She appears well-developed. No distress.  HENT:  Mouth/Throat: Oropharynx is clear and moist.  Eyes: Conjunctivae and EOM are normal. Pupils are equal, round, and reactive to light.  Neck: Neck supple. No JVD present.  Cardiovascular: Normal rate, regular rhythm and normal heart sounds.   No murmur heard. Pulmonary/Chest: Effort normal. No respiratory distress. She has wheezes. She has no rales. She exhibits no tenderness.  Abdominal: Soft. Bowel sounds are normal.  She exhibits no distension. There is no tenderness. There is no rebound and no guarding.  Lymphadenopathy:    She has no cervical adenopathy.  Neurological: She is alert and oriented to person, place, and time. She has normal reflexes. She displays normal reflexes. No cranial nerve deficit. She exhibits normal muscle tone. Coordination normal.  Skin: She is  not diaphoretic.  Psychiatric: She has a normal mood and affect. Her behavior is normal. Judgment and thought content normal.          Assessment & Plan:  Dementia with behavioral disturbance - Plan: donepezil (ARICEPT) 5 MG tablet  Hospital discharge follow-up  I believe the patient has mild dementia however due to her severe COPD, her chronic narcotic use, her chronic benzodiazepine use, she developed acute delirium due to her recent upper respiratory tract infection.  At the present time she is returned to her baseline. Her Mini-Mental Status exam today is within normal limits. There no deficiencies on her exam. That being said the family and the patient like to start Aricept 5 mg by mouth each bedtime to try to slow the progression of the dementia. I also recommended that she continue to try to limit her use of topics as well as Xanax. I like to see the patient back in one month. If she is tolerating it well I will try switching the patient from Topamax to Depakote for migraine prevention. Hopefully the Depakote will trigger some weight gain and we can get her off of medicine such as Topamax which causes anorexia.  I believe the better nutritional status the incidence of delirium would also be reduced.

## 2013-08-20 ENCOUNTER — Other Ambulatory Visit: Payer: Self-pay | Admitting: Family Medicine

## 2013-08-20 DIAGNOSIS — K219 Gastro-esophageal reflux disease without esophagitis: Secondary | ICD-10-CM

## 2013-08-20 DIAGNOSIS — I2510 Atherosclerotic heart disease of native coronary artery without angina pectoris: Secondary | ICD-10-CM

## 2013-08-20 DIAGNOSIS — M549 Dorsalgia, unspecified: Secondary | ICD-10-CM

## 2013-09-02 ENCOUNTER — Telehealth: Payer: Self-pay | Admitting: Family Medicine

## 2013-09-02 NOTE — Telephone Encounter (Signed)
ok 

## 2013-09-02 NOTE — Telephone Encounter (Signed)
Some one contacted [facility] to provider a nursing assessment for needs and Physical Therapy at home.  Wanted your approval.  Verbal approval given.  Will fax order for signature.

## 2013-09-03 ENCOUNTER — Other Ambulatory Visit: Payer: Self-pay | Admitting: Family Medicine

## 2013-09-03 DIAGNOSIS — K5730 Diverticulosis of large intestine without perforation or abscess without bleeding: Secondary | ICD-10-CM

## 2013-09-04 ENCOUNTER — Other Ambulatory Visit: Payer: Self-pay | Admitting: Family Medicine

## 2013-09-04 DIAGNOSIS — M545 Low back pain, unspecified: Secondary | ICD-10-CM

## 2013-09-04 DIAGNOSIS — J449 Chronic obstructive pulmonary disease, unspecified: Secondary | ICD-10-CM

## 2013-09-04 DIAGNOSIS — R5381 Other malaise: Secondary | ICD-10-CM

## 2013-09-04 DIAGNOSIS — M1990 Unspecified osteoarthritis, unspecified site: Secondary | ICD-10-CM

## 2013-09-04 DIAGNOSIS — F0390 Unspecified dementia without behavioral disturbance: Secondary | ICD-10-CM

## 2013-09-08 ENCOUNTER — Other Ambulatory Visit: Payer: Self-pay | Admitting: Internal Medicine

## 2013-09-16 ENCOUNTER — Encounter: Payer: Self-pay | Admitting: Family Medicine

## 2013-09-16 ENCOUNTER — Ambulatory Visit (INDEPENDENT_AMBULATORY_CARE_PROVIDER_SITE_OTHER): Payer: 59 | Admitting: Family Medicine

## 2013-09-16 VITALS — BP 170/100 | HR 82 | Temp 97.0°F | Resp 20 | Ht 62.0 in | Wt 93.0 lb

## 2013-09-16 DIAGNOSIS — J441 Chronic obstructive pulmonary disease with (acute) exacerbation: Secondary | ICD-10-CM

## 2013-09-16 MED ORDER — ALBUTEROL SULFATE (2.5 MG/3ML) 0.083% IN NEBU: 2.5000 mg | INHALATION_SOLUTION | Freq: Four times a day (QID) | RESPIRATORY_TRACT | Status: DC | PRN

## 2013-09-16 MED ORDER — FLUTICASONE-SALMETEROL 45-21 MCG/ACT IN AERO: 2.0000 | INHALATION_SPRAY | Freq: Two times a day (BID) | RESPIRATORY_TRACT | Status: DC | PRN

## 2013-09-16 MED ORDER — LEVOFLOXACIN 500 MG PO TABS: 500.0000 mg | ORAL_TABLET | Freq: Every day | ORAL | Status: DC

## 2013-09-16 MED ORDER — PREDNISONE 20 MG PO TABS: 40.0000 mg | ORAL_TABLET | Freq: Every day | ORAL | Status: DC

## 2013-09-16 NOTE — Progress Notes (Signed)
Subjective:    Patient ID: Kathy Marshall, female    DOB: 09-Oct-1934, 79 y.o.   MRN: [id number]  HPI Patient has had increasing cough, increasing shortness of breath, and increasing chest congestion over the last 4 days. She is wheezing. She is short of breath. She denies any fever. She reports chest congestion that she is unable to cough break up and get out. She denies any chest pain. She denies any angina. She does have occasional palpitations. Past Medical History  Diagnosis Date  . Low back pain   . Bronchitis   . Chronic hoarseness   . Sleep apnea, obstructive   . Renal artery stenosis   . PVD (peripheral vascular disease)   . Hypercholesteremia   . Pain in limb   . COPD (chronic obstructive pulmonary disease)   . Benign essential hypertension   . Atherosclerosis     coronary vessel type  . Coronary artery disease   . Diverticulosis   . History of GI diverticular bleed   . History of esophagitis   . H. pylori infection   . Altered mental status   . Pneumonia   . TIA (transient ischemic attack) 07/2010  . Arthritis   . GERD (gastroesophageal reflux disease)   . History of blood clots   . Tubular adenoma 2012  . Heart murmur   . CHF (congestive heart failure)   . Anginal pain   . Myocardial infarct 1990's    "I've only had one" (12/06/2012)  . Exertional shortness of breath   . Daily headache   . Anxiety   . Gastric ulcer 07/2012    large; required clipping Archie Endo 08/23/2012 (12/06/2012)   Current Outpatient Prescriptions on File Prior to Visit  Medication Sig Dispense Refill  . albuterol (PROAIR HFA) 108 (90 BASE) MCG/ACT inhaler Inhale 2 puffs into the lungs every 4 (four) hours as needed for wheezing.  1 Inhaler  6  . ALPRAZolam (XANAX) 0.5 MG tablet Take 0.5 mg by mouth 2 (two) times daily as needed for anxiety. Every night at bedtime.  Can take during the day if needed.      Marland Kitchen atorvastatin (LIPITOR) 80 MG tablet Take 80 mg by mouth at bedtime.      .  clopidogrel (PLAVIX) 75 MG tablet Take 1 tablet (75 mg total) by mouth daily.  90 tablet  1  . donepezil (ARICEPT) 5 MG tablet Take 1 tablet (5 mg total) by mouth at bedtime.  30 tablet  11  . fluticasone (FLONASE) 50 MCG/ACT nasal spray Place 2 sprays into both nostrils 2 (two) times daily as needed for allergies or rhinitis.      Marland Kitchen gabapentin (NEURONTIN) 300 MG capsule Take 300 mg by mouth at bedtime.      Marland Kitchen ibuprofen (ADVIL,MOTRIN) 200 MG tablet Take 400-600 mg by mouth every 6 (six) hours as needed for moderate pain.      . isosorbide mononitrate (IMDUR) 60 MG 24 hr tablet Take 60 mg by mouth daily.      Marland Kitchen losartan (COZAAR) 50 MG tablet Take 50 mg by mouth daily.      . metoCLOPramide (REGLAN) 5 MG tablet TAKE 1 TABLET BY MOUTH TWICE A DAY  60 tablet  1  . metoprolol tartrate (LOPRESSOR) 25 MG tablet Take 0.5 tablets (12.5 mg total) by mouth 2 (two) times daily.      Marland Kitchen morphine (MS CONTIN) 30 MG 12 hr tablet Take 30 mg by mouth. Every 12 hours as  needed for pain      . Nutritional Supplements (COMPLETE PROTEIN/VITAMIN SHAKE PO) Take 2-3 Bottles by mouth daily.      . ondansetron (ZOFRAN) 8 MG tablet TAKE 1 TABLET BY MOUTH EVERY 8 HOURS AS NEEDED FOR NAUSEA  50 tablet  0  . oxyCODONE-acetaminophen (PERCOCET) 10-325 MG per tablet Take 1 tablet by mouth 5 (five) times daily as needed for pain.      . pantoprazole (PROTONIX) 40 MG tablet Take 40 mg by mouth daily.      Marland Kitchen topiramate (TOPAMAX) 25 MG tablet Take 25 mg by mouth 4 (four) times daily as needed (headach).       No current facility-administered medications on file prior to visit.   No Known Allergies  History   Social History  . Marital Status: Divorced    Spouse Name: N/A    Number of Children: 6  . Years of Education: N/A   Occupational History  . RETIRED    Social History Main Topics  . Smoking status: Current Every Day Smoker -- 0.50 packs/day for 60 years    Types: Cigarettes  . Smokeless tobacco: Never Used  .  Alcohol Use: No     Comment: occasional  . Drug Use: No  . Sexual Activity: No   Other Topics Concern  . Not on file   Social History Narrative   4-6 cups of coffee daily       Review of Systems  All other systems reviewed and are negative.       Objective:   Physical Exam  Vitals reviewed. Constitutional: She appears well-developed and well-nourished.  Neck: Neck supple. No JVD present.  Cardiovascular: Normal rate and normal heart sounds.   Pulmonary/Chest: Effort normal. No respiratory distress. She has wheezes. She has rales.  Abdominal: Soft. Bowel sounds are normal.  Musculoskeletal: She exhibits no edema.  Lymphadenopathy:    She has no cervical adenopathy.    Patient has rhonchorous breath sounds bilaterally with bibasal rales and diffuse expiratory wheezing and decreased breath sounds.      Assessment & Plan:  1. COPD exacerbation Begin prednisone 40 mg by mouth daily for 7 days. Begin Levaquin 500 mg by mouth daily for 7 days. Begin albuterol nebulizers 2.5 mg nebs every 4-6 hours as needed for wheezing. Recheck in 3 days sooner if worse or go directly to ER if worse. - predniSONE (DELTASONE) 20 MG tablet; Take 2 tablets (40 mg total) by mouth daily with breakfast.  Dispense: 14 tablet; Refill: 0

## 2013-09-20 ENCOUNTER — Ambulatory Visit: Payer: 59 | Admitting: Family Medicine

## 2013-09-23 ENCOUNTER — Ambulatory Visit: Payer: 59 | Admitting: Family Medicine

## 2013-09-25 ENCOUNTER — Ambulatory Visit: Payer: Medicare PPO | Admitting: Cardiovascular Disease

## 2013-09-26 ENCOUNTER — Ambulatory Visit (INDEPENDENT_AMBULATORY_CARE_PROVIDER_SITE_OTHER): Payer: Commercial Managed Care - HMO | Admitting: Family Medicine

## 2013-09-26 ENCOUNTER — Encounter: Payer: Self-pay | Admitting: Family Medicine

## 2013-09-26 VITALS — BP 140/88 | HR 62 | Temp 98.6°F | Resp 16 | Ht 62.0 in | Wt 90.5 lb

## 2013-09-26 DIAGNOSIS — J441 Chronic obstructive pulmonary disease with (acute) exacerbation: Secondary | ICD-10-CM

## 2013-09-26 MED ORDER — MOXIFLOXACIN HCL 400 MG PO TABS: 400.0000 mg | ORAL_TABLET | Freq: Every day | ORAL | Status: DC

## 2013-09-26 MED ORDER — PREDNISONE 20 MG PO TABS: 60.0000 mg | ORAL_TABLET | Freq: Every day | ORAL | Status: DC

## 2013-09-26 NOTE — Progress Notes (Signed)
Subjective:    Patient ID: Kathy Marshall, female    DOB: 02-05-1935, 79 y.o.   MRN: [id number]  HPI 09/16/13 Patient has had increasing cough, increasing shortness of breath, and increasing chest congestion over the last 4 days. She is wheezing. She is short of breath. She denies any fever. She reports chest congestion that she is unable to cough break up and get out. She denies any chest pain. She denies any angina. She does have occasional palpitations.  At that time, my plan was: 1. COPD exacerbation Begin prednisone 40 mg by mouth daily for 7 days. Begin Levaquin 500 mg by mouth daily for 7 days. Begin albuterol nebulizers 2.5 mg nebs every 4-6 hours as needed for wheezing. Recheck in 3 days sooner if worse or go directly to ER if worse. - predniSONE (DELTASONE) 20 MG tablet; Take 2 tablets (40 mg total) by mouth daily with breakfast.  Dispense: 14 tablet; Refill: 0  09/26/13 Patient is no better. Today, in  the clinic, she is 87% on room air at rest. On 2 L nasal cannula she is 92%. She is still wheezing. She denies any fevers or chills. She denies any chest pain. She does not want to go back to the hospital. She continues to smoke.  She denies any hemoptysis however she continues to have cough productive of brown sputum Past Medical History  Diagnosis Date  . Low back pain   . Bronchitis   . Chronic hoarseness   . Sleep apnea, obstructive   . Renal artery stenosis   . PVD (peripheral vascular disease)   . Hypercholesteremia   . Pain in limb   . COPD (chronic obstructive pulmonary disease)   . Benign essential hypertension   . Atherosclerosis     coronary vessel type  . Coronary artery disease   . Diverticulosis   . History of GI diverticular bleed   . History of esophagitis   . H. pylori infection   . Altered mental status   . Pneumonia   . TIA (transient ischemic attack) 07/2010  . Arthritis   . GERD (gastroesophageal reflux disease)   . History of blood clots   .  Tubular adenoma 2012  . Heart murmur   . CHF (congestive heart failure)   . Anginal pain   . Myocardial infarct 1990's    "I've only had one" (12/06/2012)  . Exertional shortness of breath   . Daily headache   . Anxiety   . Gastric ulcer 07/2012    large; required clipping Archie Endo 08/23/2012 (12/06/2012)   Current Outpatient Prescriptions on File Prior to Visit  Medication Sig Dispense Refill  . albuterol (PROAIR HFA) 108 (90 BASE) MCG/ACT inhaler Inhale 2 puffs into the lungs every 4 (four) hours as needed for wheezing.  1 Inhaler  6  . albuterol (PROVENTIL) (2.5 MG/3ML) 0.083% nebulizer solution Take 3 mLs (2.5 mg total) by nebulization every 6 (six) hours as needed for wheezing or shortness of breath.  150 mL  1  . ALPRAZolam (XANAX) 0.5 MG tablet Take 0.5 mg by mouth 2 (two) times daily as needed for anxiety. Every night at bedtime.  Can take during the day if needed.      Marland Kitchen atorvastatin (LIPITOR) 80 MG tablet Take 80 mg by mouth at bedtime.      . clopidogrel (PLAVIX) 75 MG tablet Take 1 tablet (75 mg total) by mouth daily.  90 tablet  1  . donepezil (ARICEPT) 5 MG tablet  Take 1 tablet (5 mg total) by mouth at bedtime.  30 tablet  11  . fluticasone (FLONASE) 50 MCG/ACT nasal spray Place 2 sprays into both nostrils 2 (two) times daily as needed for allergies or rhinitis.      . fluticasone-salmeterol (ADVAIR HFA) 45-21 MCG/ACT inhaler Inhale 2 puffs into the lungs 2 (two) times daily as needed (wheezing and shortness of breath).  1 Inhaler  5  . gabapentin (NEURONTIN) 300 MG capsule Take 300 mg by mouth at bedtime.      Marland Kitchen ibuprofen (ADVIL,MOTRIN) 200 MG tablet Take 400-600 mg by mouth every 6 (six) hours as needed for moderate pain.      . isosorbide mononitrate (IMDUR) 60 MG 24 hr tablet Take 60 mg by mouth daily.      Marland Kitchen losartan (COZAAR) 50 MG tablet Take 50 mg by mouth daily.      . metoCLOPramide (REGLAN) 5 MG tablet TAKE 1 TABLET BY MOUTH TWICE A DAY  60 tablet  1  . metoprolol  tartrate (LOPRESSOR) 25 MG tablet Take 0.5 tablets (12.5 mg total) by mouth 2 (two) times daily.      Marland Kitchen morphine (MS CONTIN) 30 MG 12 hr tablet Take 30 mg by mouth. Every 12 hours as needed for pain      . Nutritional Supplements (COMPLETE PROTEIN/VITAMIN SHAKE PO) Take 2-3 Bottles by mouth daily.      . ondansetron (ZOFRAN) 8 MG tablet TAKE 1 TABLET BY MOUTH EVERY 8 HOURS AS NEEDED FOR NAUSEA  50 tablet  0  . oxyCODONE-acetaminophen (PERCOCET) 10-325 MG per tablet Take 1 tablet by mouth 5 (five) times daily as needed for pain.      . pantoprazole (PROTONIX) 40 MG tablet Take 40 mg by mouth daily.      Marland Kitchen topiramate (TOPAMAX) 25 MG tablet Take 25 mg by mouth 4 (four) times daily as needed (headach).       No current facility-administered medications on file prior to visit.   No Known Allergies  History   Social History  . Marital Status: Divorced    Spouse Name: N/A    Number of Children: 6  . Years of Education: N/A   Occupational History  . RETIRED    Social History Main Topics  . Smoking status: Current Every Day Smoker -- 0.50 packs/day for 60 years    Types: Cigarettes  . Smokeless tobacco: Never Used  . Alcohol Use: No     Comment: occasional  . Drug Use: No  . Sexual Activity: No   Other Topics Concern  . Not on file   Social History Narrative   4-6 cups of coffee daily       Review of Systems  All other systems reviewed and are negative.       Objective:   Physical Exam  Vitals reviewed. Constitutional: She appears well-developed and well-nourished.  Neck: Neck supple. No JVD present.  Cardiovascular: Normal rate and normal heart sounds.   Pulmonary/Chest: Effort normal. No respiratory distress. She has wheezes. She has rales.  Abdominal: Soft. Bowel sounds are normal.  Musculoskeletal: She exhibits no edema.  Lymphadenopathy:    She has no cervical adenopathy.    Patient has rhonchorous breath sounds bilaterally with bibasal rales and diffuse  expiratory wheezing and decreased breath sounds.      Assessment & Plan:  1. COPD exacerbation Going to increase the patient's prednisone dose to 60 mg a day for the next 5 days. Recheck the  patient on Monday. If her breathing worsens she is to go immediately to the hospital. I'm also going to have oxygen delivered to her home. She needs oxygen 2 L by nasal cannula. Also start the patient on Avelox 400 mg by mouth daily for 7 days. I recommended she continue to use albuterol 2 puffs inhaled every 4- 6 hours as needed.  Patient needs to go the hospital immediately if worse - predniSONE (DELTASONE) 20 MG tablet; Take 3 tablets (60 mg total) by mouth daily with breakfast.  Dispense: 15 tablet; Refill: 0 - moxifloxacin (AVELOX) 400 MG tablet; Take 1 tablet (400 mg total) by mouth daily.  Dispense: 7 tablet; Refill: 0

## 2013-09-29 ENCOUNTER — Emergency Department (HOSPITAL_COMMUNITY): Payer: Medicare PPO

## 2013-09-29 ENCOUNTER — Encounter (HOSPITAL_COMMUNITY): Payer: Self-pay | Admitting: Emergency Medicine

## 2013-09-29 ENCOUNTER — Inpatient Hospital Stay (HOSPITAL_COMMUNITY)
Admission: EM | Admit: 2013-09-29 | Discharge: 2013-10-03 | DRG: 189 | Disposition: A | Discharge status: Home Health | Payer: Medicare PPO | Attending: Internal Medicine | Admitting: Internal Medicine

## 2013-09-29 DIAGNOSIS — J441 Chronic obstructive pulmonary disease with (acute) exacerbation: Secondary | ICD-10-CM | POA: Diagnosis present

## 2013-09-29 DIAGNOSIS — I739 Peripheral vascular disease, unspecified: Secondary | ICD-10-CM

## 2013-09-29 DIAGNOSIS — R4182 Altered mental status, unspecified: Secondary | ICD-10-CM

## 2013-09-29 DIAGNOSIS — Z800 Family history of malignant neoplasm of digestive organs: Secondary | ICD-10-CM

## 2013-09-29 DIAGNOSIS — G4733 Obstructive sleep apnea (adult) (pediatric): Secondary | ICD-10-CM

## 2013-09-29 DIAGNOSIS — Y92009 Unspecified place in unspecified non-institutional (private) residence as the place of occurrence of the external cause: Secondary | ICD-10-CM

## 2013-09-29 DIAGNOSIS — R778 Other specified abnormalities of plasma proteins: Secondary | ICD-10-CM | POA: Diagnosis present

## 2013-09-29 DIAGNOSIS — F17200 Nicotine dependence, unspecified, uncomplicated: Secondary | ICD-10-CM | POA: Diagnosis present

## 2013-09-29 DIAGNOSIS — E43 Unspecified severe protein-calorie malnutrition: Secondary | ICD-10-CM

## 2013-09-29 DIAGNOSIS — Z8249 Family history of ischemic heart disease and other diseases of the circulatory system: Secondary | ICD-10-CM

## 2013-09-29 DIAGNOSIS — R748 Abnormal levels of other serum enzymes: Secondary | ICD-10-CM | POA: Diagnosis present

## 2013-09-29 DIAGNOSIS — J189 Pneumonia, unspecified organism: Secondary | ICD-10-CM

## 2013-09-29 DIAGNOSIS — IMO0002 Reserved for concepts with insufficient information to code with codable children: Secondary | ICD-10-CM

## 2013-09-29 DIAGNOSIS — K922 Gastrointestinal hemorrhage, unspecified: Secondary | ICD-10-CM

## 2013-09-29 DIAGNOSIS — F411 Generalized anxiety disorder: Secondary | ICD-10-CM | POA: Diagnosis present

## 2013-09-29 DIAGNOSIS — K2990 Gastroduodenitis, unspecified, without bleeding: Secondary | ICD-10-CM

## 2013-09-29 DIAGNOSIS — Z8719 Personal history of other diseases of the digestive system: Secondary | ICD-10-CM

## 2013-09-29 DIAGNOSIS — G8929 Other chronic pain: Secondary | ICD-10-CM | POA: Diagnosis present

## 2013-09-29 DIAGNOSIS — E7800 Pure hypercholesterolemia, unspecified: Secondary | ICD-10-CM

## 2013-09-29 DIAGNOSIS — Z7902 Long term (current) use of antithrombotics/antiplatelets: Secondary | ICD-10-CM

## 2013-09-29 DIAGNOSIS — I2489 Other forms of acute ischemic heart disease: Secondary | ICD-10-CM | POA: Diagnosis present

## 2013-09-29 DIAGNOSIS — Z79899 Other long term (current) drug therapy: Secondary | ICD-10-CM

## 2013-09-29 DIAGNOSIS — G9340 Encephalopathy, unspecified: Secondary | ICD-10-CM

## 2013-09-29 DIAGNOSIS — I509 Heart failure, unspecified: Secondary | ICD-10-CM | POA: Diagnosis present

## 2013-09-29 DIAGNOSIS — Z9861 Coronary angioplasty status: Secondary | ICD-10-CM

## 2013-09-29 DIAGNOSIS — I252 Old myocardial infarction: Secondary | ICD-10-CM

## 2013-09-29 DIAGNOSIS — J690 Pneumonitis due to inhalation of food and vomit: Secondary | ICD-10-CM

## 2013-09-29 DIAGNOSIS — K5730 Diverticulosis of large intestine without perforation or abscess without bleeding: Secondary | ICD-10-CM

## 2013-09-29 DIAGNOSIS — I10 Essential (primary) hypertension: Secondary | ICD-10-CM

## 2013-09-29 DIAGNOSIS — M79609 Pain in unspecified limb: Secondary | ICD-10-CM

## 2013-09-29 DIAGNOSIS — R498 Other voice and resonance disorders: Secondary | ICD-10-CM

## 2013-09-29 DIAGNOSIS — G9349 Other encephalopathy: Secondary | ICD-10-CM | POA: Diagnosis present

## 2013-09-29 DIAGNOSIS — E872 Acidosis, unspecified: Secondary | ICD-10-CM | POA: Diagnosis present

## 2013-09-29 DIAGNOSIS — G253 Myoclonus: Secondary | ICD-10-CM | POA: Diagnosis present

## 2013-09-29 DIAGNOSIS — Z8673 Personal history of transient ischemic attack (TIA), and cerebral infarction without residual deficits: Secondary | ICD-10-CM

## 2013-09-29 DIAGNOSIS — R64 Cachexia: Secondary | ICD-10-CM | POA: Diagnosis present

## 2013-09-29 DIAGNOSIS — J4489 Other specified chronic obstructive pulmonary disease: Secondary | ICD-10-CM

## 2013-09-29 DIAGNOSIS — M545 Low back pain, unspecified: Secondary | ICD-10-CM

## 2013-09-29 DIAGNOSIS — R634 Abnormal weight loss: Secondary | ICD-10-CM

## 2013-09-29 DIAGNOSIS — I214 Non-ST elevation (NSTEMI) myocardial infarction: Secondary | ICD-10-CM

## 2013-09-29 DIAGNOSIS — J849 Interstitial pulmonary disease, unspecified: Secondary | ICD-10-CM | POA: Diagnosis present

## 2013-09-29 DIAGNOSIS — R51 Headache: Secondary | ICD-10-CM

## 2013-09-29 DIAGNOSIS — R490 Dysphonia: Secondary | ICD-10-CM | POA: Diagnosis present

## 2013-09-29 DIAGNOSIS — I2510 Atherosclerotic heart disease of native coronary artery without angina pectoris: Secondary | ICD-10-CM

## 2013-09-29 DIAGNOSIS — F1120 Opioid dependence, uncomplicated: Secondary | ICD-10-CM | POA: Diagnosis present

## 2013-09-29 DIAGNOSIS — J449 Chronic obstructive pulmonary disease, unspecified: Secondary | ICD-10-CM

## 2013-09-29 DIAGNOSIS — K219 Gastro-esophageal reflux disease without esophagitis: Secondary | ICD-10-CM

## 2013-09-29 DIAGNOSIS — J9620 Acute and chronic respiratory failure, unspecified whether with hypoxia or hypercapnia: Principal | ICD-10-CM

## 2013-09-29 DIAGNOSIS — R799 Abnormal finding of blood chemistry, unspecified: Secondary | ICD-10-CM

## 2013-09-29 DIAGNOSIS — I248 Other forms of acute ischemic heart disease: Secondary | ICD-10-CM | POA: Diagnosis present

## 2013-09-29 DIAGNOSIS — R197 Diarrhea, unspecified: Secondary | ICD-10-CM | POA: Diagnosis not present

## 2013-09-29 DIAGNOSIS — I701 Atherosclerosis of renal artery: Secondary | ICD-10-CM

## 2013-09-29 DIAGNOSIS — K2970 Gastritis, unspecified, without bleeding: Secondary | ICD-10-CM

## 2013-09-29 DIAGNOSIS — Z801 Family history of malignant neoplasm of trachea, bronchus and lung: Secondary | ICD-10-CM

## 2013-09-29 DIAGNOSIS — Z823 Family history of stroke: Secondary | ICD-10-CM

## 2013-09-29 DIAGNOSIS — R7989 Other specified abnormal findings of blood chemistry: Secondary | ICD-10-CM

## 2013-09-29 DIAGNOSIS — T4275XA Adverse effect of unspecified antiepileptic and sedative-hypnotic drugs, initial encounter: Secondary | ICD-10-CM | POA: Diagnosis present

## 2013-09-29 DIAGNOSIS — Z66 Do not resuscitate: Secondary | ICD-10-CM | POA: Diagnosis present

## 2013-09-29 DIAGNOSIS — E8729 Other acidosis: Secondary | ICD-10-CM

## 2013-09-29 LAB — URINALYSIS, ROUTINE W REFLEX MICROSCOPIC
Glucose, UA: NEGATIVE mg/dL
Hgb urine dipstick: NEGATIVE
KETONES UR: NEGATIVE mg/dL
Leukocytes, UA: NEGATIVE
NITRITE: NEGATIVE
Protein, ur: 30 mg/dL — AB
Specific Gravity, Urine: 1.03 (ref 1.005–1.030)
UROBILINOGEN UA: 1 mg/dL (ref 0.0–1.0)
pH: 6 (ref 5.0–8.0)

## 2013-09-29 LAB — CBC WITH DIFFERENTIAL/PLATELET
BASOS ABS: 0 10*3/uL (ref 0.0–0.1)
Basophils Relative: 0 % (ref 0–1)
EOS ABS: 0.1 10*3/uL (ref 0.0–0.7)
EOS PCT: 1 % (ref 0–5)
HCT: 48.4 % — ABNORMAL HIGH (ref 36.0–46.0)
Hemoglobin: 16.1 g/dL — ABNORMAL HIGH (ref 12.0–15.0)
LYMPHS PCT: 19 % (ref 12–46)
Lymphs Abs: 1.9 10*3/uL (ref 0.7–4.0)
MCH: 32.2 pg (ref 26.0–34.0)
MCHC: 33.3 g/dL (ref 30.0–36.0)
MCV: 96.8 fL (ref 78.0–100.0)
Monocytes Absolute: 0.7 10*3/uL (ref 0.1–1.0)
Monocytes Relative: 7 % (ref 3–12)
Neutro Abs: 7.2 10*3/uL (ref 1.7–7.7)
Neutrophils Relative %: 73 % (ref 43–77)
Platelets: 201 10*3/uL (ref 150–400)
RBC: 5 MIL/uL (ref 3.87–5.11)
RDW: 13.3 % (ref 11.5–15.5)
WBC: 9.9 10*3/uL (ref 4.0–10.5)

## 2013-09-29 LAB — BASIC METABOLIC PANEL
BUN: 20 mg/dL (ref 6–23)
CO2: 29 meq/L (ref 19–32)
Calcium: 9.1 mg/dL (ref 8.4–10.5)
Chloride: 102 mEq/L (ref 96–112)
Creatinine, Ser: 0.87 mg/dL (ref 0.50–1.10)
GFR calc Af Amer: 73 mL/min — ABNORMAL LOW (ref >90)
GFR, EST NON AFRICAN AMERICAN: 63 mL/min — AB (ref >90)
Glucose, Bld: 105 mg/dL — ABNORMAL HIGH (ref 70–99)
Potassium: 3.3 mEq/L — ABNORMAL LOW (ref 3.7–5.3)
Sodium: 145 mEq/L (ref 137–147)

## 2013-09-29 LAB — I-STAT ARTERIAL BLOOD GAS, ED
ACID-BASE EXCESS: 2 mmol/L (ref 0.0–2.0)
Bicarbonate: 29.7 mEq/L — ABNORMAL HIGH (ref 20.0–24.0)
O2 SAT: 80 %
TCO2: 31 mmol/L (ref 0–100)
pCO2 arterial: 58.5 mmHg (ref 35.0–45.0)
pH, Arterial: 7.314 — ABNORMAL LOW (ref 7.350–7.450)
pO2, Arterial: 49 mmHg — ABNORMAL LOW (ref 80.0–100.0)

## 2013-09-29 LAB — URINE MICROSCOPIC-ADD ON

## 2013-09-29 LAB — TROPONIN I
TROPONIN I: 0.54 ng/mL — AB (ref <0.30)
TROPONIN I: 0.48 ng/mL — AB (ref <0.30)

## 2013-09-29 LAB — PRO B NATRIURETIC PEPTIDE: PRO B NATRI PEPTIDE: 582.1 pg/mL — AB (ref 0–450)

## 2013-09-29 MED ORDER — VANCOMYCIN HCL IN DEXTROSE 1-5 GM/200ML-% IV SOLN
1000.0000 mg | Freq: Once | INTRAVENOUS | Status: AC
  Administered 2013-09-29: 1000 mg via INTRAVENOUS
  Filled 2013-09-29: qty 200

## 2013-09-29 MED ORDER — DONEPEZIL HCL 5 MG PO TABS
5.0000 mg | ORAL_TABLET | Freq: Every day | ORAL | Status: DC
  Administered 2013-09-29 – 2013-10-02 (×4): 5 mg via ORAL
  Filled 2013-09-29 (×7): qty 1

## 2013-09-29 MED ORDER — IPRATROPIUM-ALBUTEROL 0.5-2.5 (3) MG/3ML IN SOLN
3.0000 mL | Freq: Four times a day (QID) | RESPIRATORY_TRACT | Status: DC
  Administered 2013-09-29 – 2013-09-30 (×3): 3 mL via RESPIRATORY_TRACT
  Filled 2013-09-29 (×3): qty 3

## 2013-09-29 MED ORDER — PIPERACILLIN-TAZOBACTAM 3.375 G IVPB
3.3750 g | Freq: Three times a day (TID) | INTRAVENOUS | Status: DC
  Administered 2013-09-29: 3.375 g via INTRAVENOUS
  Filled 2013-09-29 (×2): qty 50

## 2013-09-29 MED ORDER — FLUTICASONE PROPIONATE 50 MCG/ACT NA SUSP
2.0000 | Freq: Two times a day (BID) | NASAL | Status: DC | PRN
  Filled 2013-09-29: qty 16

## 2013-09-29 MED ORDER — ATORVASTATIN CALCIUM 80 MG PO TABS
80.0000 mg | ORAL_TABLET | Freq: Every day | ORAL | Status: DC
  Administered 2013-09-29 – 2013-10-02 (×4): 80 mg via ORAL
  Filled 2013-09-29 (×6): qty 1

## 2013-09-29 MED ORDER — ACETAMINOPHEN 650 MG RE SUPP: 650.0000 mg | Freq: Four times a day (QID) | RECTAL | Status: DC | PRN

## 2013-09-29 MED ORDER — ISOSORBIDE MONONITRATE ER 60 MG PO TB24
60.0000 mg | ORAL_TABLET | Freq: Every evening | ORAL | Status: DC
  Filled 2013-09-29: qty 1

## 2013-09-29 MED ORDER — METOPROLOL TARTRATE 12.5 MG HALF TABLET
12.5000 mg | ORAL_TABLET | Freq: Two times a day (BID) | ORAL | Status: DC
  Administered 2013-09-29 – 2013-10-03 (×8): 12.5 mg via ORAL
  Filled 2013-09-29 (×10): qty 1

## 2013-09-29 MED ORDER — ONDANSETRON HCL 4 MG/2ML IJ SOLN
4.0000 mg | Freq: Four times a day (QID) | INTRAMUSCULAR | Status: DC | PRN
  Administered 2013-09-29 – 2013-10-02 (×6): 4 mg via INTRAVENOUS
  Filled 2013-09-29 (×6): qty 2

## 2013-09-29 MED ORDER — ACETAMINOPHEN 325 MG PO TABS: 650.0000 mg | ORAL_TABLET | Freq: Four times a day (QID) | ORAL | Status: DC | PRN

## 2013-09-29 MED ORDER — ONDANSETRON HCL 4 MG PO TABS: 4.0000 mg | ORAL_TABLET | Freq: Four times a day (QID) | ORAL | Status: DC | PRN

## 2013-09-29 MED ORDER — METOCLOPRAMIDE HCL 5 MG PO TABS
5.0000 mg | ORAL_TABLET | Freq: Two times a day (BID) | ORAL | Status: DC
  Administered 2013-09-29 – 2013-10-03 (×8): 5 mg via ORAL
  Filled 2013-09-29 (×10): qty 1

## 2013-09-29 MED ORDER — CLOPIDOGREL BISULFATE 75 MG PO TABS
75.0000 mg | ORAL_TABLET | Freq: Every day | ORAL | Status: DC
Start: 2013-09-29 — End: 2013-10-03
  Administered 2013-09-29 – 2013-10-03 (×5): 75 mg via ORAL
  Filled 2013-09-29 (×5): qty 1

## 2013-09-29 MED ORDER — PIPERACILLIN-TAZOBACTAM 3.375 G IVPB 30 MIN: 3.3750 g | Freq: Three times a day (TID) | INTRAVENOUS | Status: DC

## 2013-09-29 MED ORDER — MOMETASONE FURO-FORMOTEROL FUM 100-5 MCG/ACT IN AERO
2.0000 | INHALATION_SPRAY | Freq: Two times a day (BID) | RESPIRATORY_TRACT | Status: DC
Start: 2013-09-29 — End: 2013-10-03
  Administered 2013-09-30 – 2013-10-03 (×3): 2 via RESPIRATORY_TRACT
  Filled 2013-09-29 (×3): qty 8.8

## 2013-09-29 MED ORDER — PIPERACILLIN-TAZOBACTAM 3.375 G IVPB 30 MIN
3.3750 g | Freq: Once | INTRAVENOUS | Status: AC
  Administered 2013-09-29: 3.375 g via INTRAVENOUS
  Filled 2013-09-29: qty 50

## 2013-09-29 MED ORDER — TOPIRAMATE 25 MG PO TABS
50.0000 mg | ORAL_TABLET | Freq: Every day | ORAL | Status: DC
  Administered 2013-09-29 – 2013-10-01 (×3): 50 mg via ORAL
  Filled 2013-09-29 (×4): qty 2

## 2013-09-29 MED ORDER — PANTOPRAZOLE SODIUM 40 MG PO TBEC
40.0000 mg | DELAYED_RELEASE_TABLET | Freq: Every day | ORAL | Status: DC
  Administered 2013-09-29 – 2013-10-03 (×4): 40 mg via ORAL
  Filled 2013-09-29 (×3): qty 1

## 2013-09-29 MED ORDER — VANCOMYCIN HCL 500 MG IV SOLR
500.0000 mg | INTRAVENOUS | Status: DC
  Administered 2013-09-30: 500 mg via INTRAVENOUS
  Filled 2013-09-29 (×4): qty 500

## 2013-09-29 MED ORDER — HEPARIN SODIUM (PORCINE) 5000 UNIT/ML IJ SOLN
5000.0000 [IU] | Freq: Three times a day (TID) | INTRAMUSCULAR | Status: DC
  Administered 2013-09-29 – 2013-10-03 (×12): 5000 [IU] via SUBCUTANEOUS
  Filled 2013-09-29 (×17): qty 1

## 2013-09-29 MED ORDER — OXYCODONE-ACETAMINOPHEN 5-325 MG PO TABS
1.0000 | ORAL_TABLET | ORAL | Status: DC | PRN
  Administered 2013-09-29 – 2013-10-01 (×4): 1 via ORAL
  Filled 2013-09-29 (×5): qty 1

## 2013-09-29 MED ORDER — POTASSIUM CHLORIDE 2 MEQ/ML IV SOLN
INTRAVENOUS | Status: DC
  Filled 2013-09-29 (×4): qty 1000

## 2013-09-29 MED ORDER — PIPERACILLIN-TAZOBACTAM 3.375 G IVPB
3.3750 g | Freq: Three times a day (TID) | INTRAVENOUS | Status: DC
  Administered 2013-09-30: 3.375 g via INTRAVENOUS
  Filled 2013-09-29 (×3): qty 50

## 2013-09-29 NOTE — Progress Notes (Signed)
ANTIBIOTIC CONSULT NOTE - INITIAL  Pharmacy Consult for vancomycin + zosyn Indication: rule out pneumonia  No Known Allergies  Patient Measurements:   Adjusted Body Weight:   Vital Signs: Temp: 99.4 F (37.4 C) (04/05 1134) Temp src: Oral (04/05 1134) BP: 134/59 mmHg (04/05 1250) Pulse Rate: 72 (04/05 1250) Intake/Output from previous day:   Intake/Output from this shift:    Labs:  Recent Labs  09/29/13 1155  WBC 9.9  HGB 16.1*  PLT 201  CREATININE 0.87   The CrCl is unknown because both a height and weight (above a minimum accepted value) are required for this calculation. No results found for this basename: VANCOTROUGH, VANCOPEAK, VANCORANDOM, GENTTROUGH, GENTPEAK, GENTRANDOM, TOBRATROUGH, TOBRAPEAK, TOBRARND, AMIKACINPEAK, AMIKACINTROU, AMIKACIN,  in the last 72 hours   Microbiology: No results found for this or any previous visit (from the past 720 hour(s)).  Medical History: Past Medical History  Diagnosis Date  . Low back pain   . Bronchitis   . Chronic hoarseness   . Sleep apnea, obstructive   . Renal artery stenosis   . PVD (peripheral vascular disease)   . Hypercholesteremia   . Pain in limb   . COPD (chronic obstructive pulmonary disease)   . Benign essential hypertension   . Atherosclerosis     coronary vessel type  . Coronary artery disease   . Diverticulosis   . History of GI diverticular bleed   . History of esophagitis   . H. pylori infection   . Altered mental status   . Pneumonia   . TIA (transient ischemic attack) 07/2010  . Arthritis   . GERD (gastroesophageal reflux disease)   . History of blood clots   . Tubular adenoma 2012  . Heart murmur   . CHF (congestive heart failure)   . Anginal pain   . Myocardial infarct 1990's    "I've only had one" (12/06/2012)  . Exertional shortness of breath   . Daily headache   . Anxiety   . Gastric ulcer 07/2012    large; required clipping Archie Endo 08/23/2012 (12/06/2012)    Medications:   Anti-infectives   Start     Dose/Rate Route Frequency Ordered Stop   09/30/13 1400  vancomycin (VANCOCIN) 500 mg in sodium chloride 0.9 % 100 mL IVPB     500 mg 100 mL/hr over 60 Minutes Intravenous Every 24 hours 09/29/13 1300     09/29/13 1930  piperacillin-tazobactam (ZOSYN) IVPB 3.375 g     3.375 g 12.5 mL/hr over 240 Minutes Intravenous Every 8 hours 09/29/13 1300     09/29/13 1230  vancomycin (VANCOCIN) IVPB 1000 mg/200 mL premix     1,000 mg 200 mL/hr over 60 Minutes Intravenous  Once 09/29/13 1229     09/29/13 1230  piperacillin-tazobactam (ZOSYN) IVPB 3.375 g     3.375 g 100 mL/hr over 30 Minutes Intravenous  Once 09/29/13 1229       Assessment: 37 yof presented to the hospital with AMS and difficulty breathing. She was recently prescribed avelox + prednisone at her PCP. Now to start vancomycin + zosyn for possible pneumonia. Pt is afebrile and WBC is WNL.   Vanc 4/5>> Zosyn 4/5>>  Goal of Therapy:  Vancomycin trough level 15-20 mcg/ml  Plan:  1. Vancomycin 1gm IV x 1 then 500mg  IV Q24H 2. Zosyn 3.375gm IV x 1 over 30 min then 3.375gm IV Q8H (4 hr inf) 3. F/u renal fxn, C&S, clinical status and trough at [place], Rande Lawman  09/29/2013,1:01 PM

## 2013-09-29 NOTE — H&P (Addendum)
History and Physical  Kathy Marshall NUU:[id number] DOB: Mar 26, 1935 DOA: 09/29/2013   PCP: Odette Fraction, MD   Chief Complaint:  Encephalopathy, shortness of breath  HPI:  79 year old female with a history of hypertension, CAD with history of RCA stent, COPD with continued tobacco use, chronic back pain, and TIA was brought to the hospital when the patient's daughter had difficulty arousing her from sleep this morning. The patient saw her primary care physician on 09/16/2013. The patient was placed on levofloxacin and prednisone 40 mg daily for COPD exacerbation. The patient did not experience any improvement. She went back to see her primary care physician on 09/26/2013. The patient was given prednisone 60 mg daily with moxifloxacin. During her 09/26/2013 visit, the patient was noted to be hypoxemic in the office. The patient was arranged to have 2 L nasal cannula. The patient continued to have worsening cough and shortness of breath. In addition, the patient was noted to be more somnolent on the day prior to admission. EMS was activated and the patient was lethargic this morning. The patient is unable to provide any history at this time due to her encephalopathy. All this history is obtained from speaking with the patient's daughter at the bedside. There's been chronic history of vomiting from esophagitis. The patient has numerous episodes of vomitus on weekly basis, but the daughter states that she has not vomited in the past 3 days. There is no history of hematemesis or diarrhea. There's been some subjective fevers and chills. No complaints of headaches, hematochezia, dysuria, melena. The patient continues to smoke one pack per day.  In the ED, ABG was performed which revealed 7.31/58/49/29 on 2 L. The patient was placed on a 45% Venturi mask with improvement of her oxygen saturation is 95-96% range. Chest x-ray to right lower lobe opacity with hyperinflation. BMP showed potassium 3.3.  WBC was 9.9. The patient was afebrile and hemodynamically stable. Initial troponin was about 54. EKG showed minimal ST depression in II, II, aVF. Urinalysis was negative for pyuria. Assessment/Plan: Aspiration pneumonia  -Given the patient's history of recurrent vomiting and chest x-ray opacity there is a high clinical suspicion for aspiration pneumonitis. -Blood cultures have been obtained -Continue vancomycin and Zosyn that were started in the emergency department -Supplement oxygen Acute respiratory failure -The patient continues to smoke one pack per day -Aerosolized albuterol and Atrovent -Pulmonary hygiene -Minimize opioids Acute encephalopathy -Secondary to medications as well as infectious process and respiratory failure -According to the patient's daughter, there is concern that the patient over uses her opioids -The patient is more alert than when she was at home according to the patient's family Elevated troponin -This is likely demand ischemia although cannot rule out type II NSTEMI -Cardiology consultation has been placed--spoke with Kerin Ransom, PA-C COPD -Patient currently does not have any wheezing -I would defer continuing any steroids at this time pending clinical progress -Continue long-acting beta agonist Hypertension -Continue metoprolol tartrate -Hold ARB for now as the patient's blood pressure has been soft CAD with a history of RCA stent -Continue Plavix Myoclonus -Likely due to the patient's gabapentin which has been discontinued Chronic pain/opioid dependence -Minimize opioids -Restart Percocet only Code Status -DNR--verified with daughter at bedside    Past Medical History  Diagnosis Date  . Low back pain   . Bronchitis   . Chronic hoarseness   . Sleep apnea, obstructive   . Renal artery stenosis   . PVD (peripheral vascular disease)   .  Hypercholesteremia   . Pain in limb   . COPD (chronic obstructive pulmonary disease)   . Benign essential  hypertension   . Atherosclerosis     coronary vessel type  . Coronary artery disease   . Diverticulosis   . History of GI diverticular bleed   . History of esophagitis   . H. pylori infection   . Altered mental status   . Pneumonia   . TIA (transient ischemic attack) 07/2010  . Arthritis   . GERD (gastroesophageal reflux disease)   . History of blood clots   . Tubular adenoma 2012  . Heart murmur   . CHF (congestive heart failure)   . Anginal pain   . Myocardial infarct 1990's    "I've only had one" (12/06/2012)  . Exertional shortness of breath   . Daily headache   . Anxiety   . Gastric ulcer 07/2012    large; required clipping Archie Endo 08/23/2012 (12/06/2012)   Past Surgical History  Procedure Laterality Date  . Oophorectomy    . Coronary angioplasty with stent placement  2006    Cypher stent to RCA. On cath in 2008 stent described as widely patent.   . Esophagogastroduodenoscopy N/A 08/21/2012    Procedure: ESOPHAGOGASTRODUODENOSCOPY (EGD);  Surgeon: Lafayette Dragon, MD;  Location: [facility] ENDOSCOPY;  Service: Endoscopy;  Laterality: N/A;  . Vaginal hysterectomy    . Esophagogastroduodenoscopy N/A 12/07/2012    Procedure: ESOPHAGOGASTRODUODENOSCOPY (EGD);  Surgeon: Gatha Mayer, MD;  Location: [facility] ENDOSCOPY;  Service: Endoscopy;  Laterality: N/A;   Social History:  reports that she has been smoking Cigarettes.  She has a 30 pack-year smoking history. She has never used smokeless tobacco. She reports that she does not drink alcohol or use illicit drugs.   Family History  Problem Relation Age of Onset  . Heart disease Mother   . Stroke Mother   . Stomach cancer Mother   . Heart disease Father   . Stroke Father   . Heart disease Sister   . Colon cancer Neg Hx   . Lung cancer Sister      No Known Allergies    Prior to Admission medications   Medication Sig Start Date End Date Taking? Authorizing Provider  albuterol (PROVENTIL) (2.5 MG/3ML) 0.083% nebulizer solution Take 3  mLs (2.5 mg total) by nebulization every 6 (six) hours as needed for wheezing or shortness of breath. 09/16/13  Yes Susy Frizzle, MD  ALPRAZolam Duanne Moron) 0.5 MG tablet Take 0.5 mg by mouth at bedtime.    Yes Historical Provider, MD  atorvastatin (LIPITOR) 80 MG tablet Take 80 mg by mouth at bedtime.   Yes Historical Provider, MD  clopidogrel (PLAVIX) 75 MG tablet Take 1 tablet (75 mg total) by mouth daily. 07/25/13  Yes Troy Sine, MD  donepezil (ARICEPT) 5 MG tablet Take 1 tablet (5 mg total) by mouth at bedtime. 08/19/13  Yes Susy Frizzle, MD  fluticasone (FLONASE) 50 MCG/ACT nasal spray Place 2 sprays into both nostrils 2 (two) times daily as needed for allergies or rhinitis.   Yes Historical Provider, MD  fluticasone-salmeterol (ADVAIR HFA) 45-21 MCG/ACT inhaler Inhale 2 puffs into the lungs 2 (two) times daily as needed (wheezing and shortness of breath). 09/16/13  Yes Susy Frizzle, MD  gabapentin (NEURONTIN) 300 MG capsule Take 300 mg by mouth at bedtime.   Yes Historical Provider, MD  ibuprofen (ADVIL,MOTRIN) 200 MG tablet Take 400-600 mg by mouth every 6 (six) hours as needed for  moderate pain.   Yes Historical Provider, MD  isosorbide mononitrate (IMDUR) 60 MG 24 hr tablet Take 60 mg by mouth every evening.    Yes Historical Provider, MD  losartan (COZAAR) 50 MG tablet Take 50 mg by mouth daily.   Yes Historical Provider, MD  metoCLOPramide (REGLAN) 5 MG tablet Take 5 mg by mouth 2 (two) times daily.   Yes Historical Provider, MD  metoprolol tartrate (LOPRESSOR) 25 MG tablet Take 0.5 tablets (12.5 mg total) by mouth 2 (two) times daily. 08/23/12  Yes Debbe Odea, MD  morphine (MS CONTIN) 30 MG 12 hr tablet Take 30 mg by mouth. Every 12 hours as needed for pain   Yes Historical Provider, MD  moxifloxacin (AVELOX) 400 MG tablet Take 1 tablet (400 mg total) by mouth daily. 09/26/13  Yes Susy Frizzle, MD  Nutritional Supplements (COMPLETE PROTEIN/VITAMIN SHAKE PO) Take 2-3 Bottles by  mouth daily as needed (nutritional supplment).    Yes Historical Provider, MD  ondansetron (ZOFRAN) 8 MG tablet Take 8 mg by mouth every 8 (eight) hours as needed for nausea or vomiting.   Yes Historical Provider, MD  oxyCODONE-acetaminophen (PERCOCET) 10-325 MG per tablet Take 1 tablet by mouth 5 (five) times daily as needed for pain.   Yes Historical Provider, MD  pantoprazole (PROTONIX) 40 MG tablet Take 40 mg by mouth daily.   Yes Historical Provider, MD  predniSONE (DELTASONE) 20 MG tablet Take 3 tablets (60 mg total) by mouth daily with breakfast. 09/26/13  Yes Susy Frizzle, MD  topiramate (TOPAMAX) 25 MG tablet Take 50 mg by mouth daily.    Yes Historical Provider, MD  albuterol (PROAIR HFA) 108 (90 BASE) MCG/ACT inhaler Inhale 2 puffs into the lungs every 4 (four) hours as needed for wheezing. 01/14/13   Alycia Rossetti, MD    Review of Systems:  Constitutional:  No weight loss, night sweats Head&Eyes: No headache.  No vision loss.  No eye pain or scotoma ENT:  No Difficulty swallowing,Tooth/dental problems,Sore throat,   Cardio-vascular:  No chest pain, Orthopnea, PND, swelling in lower extremities,  dizziness, palpitations  GI:  No  abdominal pain,diarrhea, loss of appetite, hematochezia, melena, heartburn, indigestion, Resp:   No coughing up of blood .No wheezing.No chest wall deformity  Skin:  no rash or lesions.  GU:  no dysuria, change in color of urine, no urgency or frequency. .  Musculoskeletal:  No joint pain or swelling. No decreased range of motion.  Psych:  No change in mood or affect.  Neurologic: No headache, no dysesthesia, no focal weakness, no vision loss. No syncope  Physical Exam: Filed Vitals:   09/29/13 1215 09/29/13 1250 09/29/13 1334 09/29/13 1416  BP: 131/63 134/59 125/80 144/101  Pulse: 74 72 77 74  Temp:      TempSrc:      Resp: 18 18 18 22   SpO2: 87% 88% 93% 93%   General:  Awake and alert, confused, NAD,pleasant/cooperative Head/Eye:  No conjunctival hemorrhage, no icterus, [place]/AT, No nystagmus ENT:  No icterus,  No thrush,no pharyngeal exudate Neck:  No masses, no lymphadenpathy, no meningismus CV:  RRR, no rub, no gallop, no S3 Lung:  Scattered bilateral rhonchi. No wheezing. Abdomen: soft/NT, +BS, nondistended, no peritoneal signs Ext: No cyanosis, No rashes, No petechiae, No lymphangitis, No edema Neuro: CNII-XII intact, strength 4/5 in bilateral upper and lower extremities, no dysmetria  Labs on Admission:  Basic Metabolic Panel:  Recent Labs Lab 09/29/13 1155  NA 145  K 3.3*  CL 102  CO2 29  GLUCOSE 105*  BUN 20  CREATININE 0.87  CALCIUM 9.1   Liver Function Tests: No results found for this basename: AST, ALT, ALKPHOS, BILITOT, PROT, ALBUMIN,  in the last 168 hours No results found for this basename: LIPASE, AMYLASE,  in the last 168 hours No results found for this basename: AMMONIA,  in the last 168 hours CBC:  Recent Labs Lab 09/29/13 1155  WBC 9.9  NEUTROABS 7.2  HGB 16.1*  HCT 48.4*  MCV 96.8  PLT 201   Cardiac Enzymes:  Recent Labs Lab 09/29/13 1155  TROPONINI 0.54*   BNP: No components found with this basename: POCBNP,  CBG: No results found for this basename: GLUCAP,  in the last 168 hours  Radiological Exams on Admission: Dg Chest Port 1 View  09/29/2013   CLINICAL DATA:  Altered mental status, hypoxia, oxygen-dependent  EXAM: PORTABLE CHEST - 1 VIEW  COMPARISON:  08/13/2013  FINDINGS: Background hyperinflation noted compatible with COPD/ emphysema. Heart is enlarged but normal vascularity. Negative for CHF. Exam is rotated to the right. Despite this, there is increased opacity in the right base noted concerning for airspace disease or pneumonia. Aspiration could have a similar appearance. Left lung remains clear. No effusion or pneumothorax. Atherosclerosis of the aorta noted. Coronary stents evident.  IMPRESSION: Right base patchy airspace process concerning for pneumonia   Background COPD/emphysema   Electronically Signed   By: Daryll Brod M.D.   On: 09/29/2013 11:58    EKG: Independently reviewed. Sinus with minimal ST depression II, III, avF    Time spent:70 minutes Code Status:   DNR Family Communication:   Daughter at bedside   Andron Marrazzo, DO  [facility] Pager [phone]  If 7PM-7AM, please contact night-coverage [email or web address] Password [email or web address] 09/29/2013, 2:28 PM

## 2013-09-29 NOTE — ED Notes (Signed)
[facility] presents with a 79 yo female from home with difficulty breathing and difficulty to arouse patient from sleep.  Pt saw PCP on Thursday for difficulty breathing/non productive cough and was given an antibiotic and prednisone.  Conflicting stories report patient was supposed to use O2 at bedtime/patient was supposed to use O2 daily.  Upon [facility] arrival, patient was 85% SpO2 on RA and lethargic/patient has been progressively lethargic since Thursday as reported by family.  5 mg albuterol given for wheezing.  No wheezing at this time but rhonchus.  Hx of dementia/family reports dementia has gotten worse since Thursday.

## 2013-09-29 NOTE — Consult Note (Signed)
Reason for Consult: Elevated Troponin  Requesting Physician: Dr Carles Collet  HPI: This is a 79 y.o. female with a past medical history significant for severe COPD, chronic pain on narcotics, CAD s/p RCA DES x 2 in '06, and GI bleeding Feb 2014 secondary to PUD. She is on Plavix, no ASA. She is cachectic - 90 lbs. She has continued to smoke. She recently saw her primary care provider and was put on antibiotics and steroids as an OP for a COPD exacerbation. She was seen in follow up and not improved and home O2 at 2 L was added. She presents to the ER now lethargic and confused per family. A Troponin was drawn and is elevated at 0.54 and we are asked to see her in consult. The family states she has not complained of chest pain. The pt ios unable to give me any reliable history and history was obtained from family members present.   PMHx:  Past Medical History  Diagnosis Date  . Low back pain   . Bronchitis   . Chronic hoarseness   . Sleep apnea, obstructive   . Renal artery stenosis   . PVD (peripheral vascular disease)   . Hypercholesteremia   . Pain in limb   . COPD (chronic obstructive pulmonary disease)   . Benign essential hypertension   . Atherosclerosis     coronary vessel type  . Coronary artery disease   . Diverticulosis   . History of GI diverticular bleed   . History of esophagitis   . H. pylori infection   . Altered mental status   . Pneumonia   . TIA (transient ischemic attack) 07/2010  . Arthritis   . GERD (gastroesophageal reflux disease)   . History of blood clots   . Tubular adenoma 2012  . Heart murmur   . CHF (congestive heart failure)   . Anginal pain   . Myocardial infarct 1990's    "I've only had one" (12/06/2012)  . Exertional shortness of breath   . Daily headache   . Anxiety   . Gastric ulcer 07/2012    large; required clipping Archie Endo 08/23/2012 (12/06/2012)   Past Surgical History  Procedure Laterality Date  . Oophorectomy    . Coronary angioplasty with  stent placement  2006    Cypher stent to RCA. On cath in 2008 stent described as widely patent.   . Esophagogastroduodenoscopy N/A 08/21/2012    Procedure: ESOPHAGOGASTRODUODENOSCOPY (EGD);  Surgeon: Lafayette Dragon, MD;  Location: [facility] ENDOSCOPY;  Service: Endoscopy;  Laterality: N/A;  . Vaginal hysterectomy    . Esophagogastroduodenoscopy N/A 12/07/2012    Procedure: ESOPHAGOGASTRODUODENOSCOPY (EGD);  Surgeon: Gatha Mayer, MD;  Location: [facility] ENDOSCOPY;  Service: Endoscopy;  Laterality: N/A;    FAMHx: Mother and Father had CAD   SOCHx:  reports that she has been smoking Cigarettes.  She has a 30 pack-year smoking history. She has never used smokeless tobacco. She reports that she does not drink alcohol or use illicit drugs.  ALLERGIES: No Known Allergies  ROS: Review of systems not obtained due to patient factors.  HOME MEDICATIONS:  (Not in a hospital admission)  HOSPITAL MEDICATIONS: I have reviewed the patient's current medications.  VITALS: Blood pressure 144/101, pulse 74, temperature 99.4 F (37.4 C), temperature source Oral, resp. rate 22, SpO2 93.00%.  PHYSICAL EXAM: General appearance: cachectic, slowed mentation and chronically ill appearing 90 lb female, on O2 Neck: no carotid bruit and no JVD Lungs: kyphosis, decreased breath sounds but she  did have breath sounds almost to her bases. No rales Heart: regular rate and rhythm Abdomen: not tender not distended Extremities: no edema Pulses: diminnished Skin: pale, dry Neurologic: awake, responds and follows directions but slurred speech and confused about current events  LABS: Results for orders placed during the hospital encounter of 09/29/13 (from the past 48 hour(s))  CBC WITH DIFFERENTIAL     Status: Abnormal   Collection Time    09/29/13 11:55 AM      Result Value Ref Range   WBC 9.9  4.0 - 10.5 K/uL   RBC 5.00  3.87 - 5.11 MIL/uL   Hemoglobin 16.1 (*) 12.0 - 15.0 g/dL   HCT 48.4 (*) 36.0 - 46.0 %   MCV  96.8  78.0 - 100.0 fL   MCH 32.2  26.0 - 34.0 pg   MCHC 33.3  30.0 - 36.0 g/dL   RDW 13.3  11.5 - 15.5 %   Platelets 201  150 - 400 K/uL   Neutrophils Relative % 73  43 - 77 %   Neutro Abs 7.2  1.7 - 7.7 K/uL   Lymphocytes Relative 19  12 - 46 %   Lymphs Abs 1.9  0.7 - 4.0 K/uL   Monocytes Relative 7  3 - 12 %   Monocytes Absolute 0.7  0.1 - 1.0 K/uL   Eosinophils Relative 1  0 - 5 %   Eosinophils Absolute 0.1  0.0 - 0.7 K/uL   Basophils Relative 0  0 - 1 %   Basophils Absolute 0.0  0.0 - 0.1 K/uL  BASIC METABOLIC PANEL     Status: Abnormal   Collection Time    09/29/13 11:55 AM      Result Value Ref Range   Sodium 145  137 - 147 mEq/L   Potassium 3.3 (*) 3.7 - 5.3 mEq/L   Chloride 102  96 - 112 mEq/L   CO2 29  19 - 32 mEq/L   Glucose, Bld 105 (*) 70 - 99 mg/dL   BUN 20  6 - 23 mg/dL   Creatinine, Ser 0.87  0.50 - 1.10 mg/dL   Calcium 9.1  8.4 - 10.5 mg/dL   GFR calc non Af Amer 63 (*) >90 mL/min   GFR calc Af Amer 73 (*) >90 mL/min   Comment: (NOTE)     The eGFR has been calculated using the CKD EPI equation.     This calculation has not been validated in all clinical situations.     eGFR's persistently <90 mL/min signify possible Chronic Kidney     Disease.  TROPONIN I     Status: Abnormal   Collection Time    09/29/13 11:55 AM      Result Value Ref Range   Troponin I 0.54 (*) <0.30 ng/mL   Comment:            Due to the release kinetics of cTnI,     a negative result within the first hours     of the onset of symptoms does not rule out     myocardial infarction with certainty.     If myocardial infarction is still suspected,     repeat the test at appropriate intervals.     CRITICAL RESULT CALLED TO, READ BACK BY AND VERIFIED WITH:     T.MITCHELL,RN 1301 09/29/13 M.CAMPBELL  PRO B NATRIURETIC PEPTIDE     Status: Abnormal   Collection Time    09/29/13 11:55 AM  Result Value Ref Range   Pro B Natriuretic peptide (BNP) 582.1 (*) 0 - 450 pg/mL  I-STAT ARTERIAL  BLOOD GAS, ED     Status: Abnormal   Collection Time    09/29/13 12:09 PM      Result Value Ref Range   pH, Arterial 7.314 (*) 7.350 - 7.450   pCO2 arterial 58.5 (*) 35.0 - 45.0 mmHg   pO2, Arterial 49.0 (*) 80.0 - 100.0 mmHg   Bicarbonate 29.7 (*) 20.0 - 24.0 mEq/L   TCO2 31  0 - 100 mmol/L   O2 Saturation 80.0     Acid-Base Excess 2.0  0.0 - 2.0 mmol/L   Collection site RADIAL, ALLEN'S TEST ACCEPTABLE     Drawn by RT     Sample type ARTERIAL     Comment NOTIFIED PHYSICIAN    URINALYSIS, ROUTINE W REFLEX MICROSCOPIC     Status: Abnormal   Collection Time    09/29/13 12:23 PM      Result Value Ref Range   Color, Urine AMBER (*) YELLOW   Comment: BIOCHEMICALS MAY BE AFFECTED BY COLOR   APPearance CLOUDY (*) CLEAR   Specific Gravity, Urine 1.030  1.005 - 1.030   pH 6.0  5.0 - 8.0   Glucose, UA NEGATIVE  NEGATIVE mg/dL   Hgb urine dipstick NEGATIVE  NEGATIVE   Bilirubin Urine SMALL (*) NEGATIVE   Ketones, ur NEGATIVE  NEGATIVE mg/dL   Protein, ur 30 (*) NEGATIVE mg/dL   Urobilinogen, UA 1.0  0.0 - 1.0 mg/dL   Nitrite NEGATIVE  NEGATIVE   Leukocytes, UA NEGATIVE  NEGATIVE  URINE MICROSCOPIC-ADD ON     Status: Abnormal   Collection Time    09/29/13 12:23 PM      Result Value Ref Range   Squamous Epithelial / LPF FEW (*) RARE   WBC, UA 0-2  <3 WBC/hpf   Bacteria, UA FEW (*) RARE   Urine-Other LESS THAN 10 mL OF URINE SUBMITTED      EKG: NSR, septal Q, PACs   IMAGING: Dg Chest Port 1 View  09/29/2013   CLINICAL DATA:  Altered mental status, hypoxia, oxygen-dependent  EXAM: PORTABLE CHEST - 1 VIEW  COMPARISON:  08/13/2013  FINDINGS: Background hyperinflation noted compatible with COPD/ emphysema. Heart is enlarged but normal vascularity. Negative for CHF. Exam is rotated to the right. Despite this, there is increased opacity in the right base noted concerning for airspace disease or pneumonia. Aspiration could have a similar appearance. Left lung remains clear. No effusion or  pneumothorax. Atherosclerosis of the aorta noted. Coronary stents evident.  IMPRESSION: Right base patchy airspace process concerning for pneumonia  Background COPD/emphysema   Electronically Signed   By: Daryll Brod M.D.   On: 09/29/2013 11:58    IMPRESSION: Principal Problem:   Acute on chronic respiratory failure Active Problems:   COPD   CAD- tandem Cyper DES to prox. and Mid RCA 2006, stable at cath 2008, low risk Myoview March 2014   Protein-calorie malnutrition, severe   Acute encephalopathy   Aspiration pneumonia   Elevated troponin   HYPERCHOLESTEROLEMIA   HYPERTENSION, BENIGN ESSENTIAL   GI bleed- Feb 2014, June 2014   RECOMMENDATION: MD to see. This appears to acute on chronic respiratory failure. DNR status discussed and confirmed with family per Dr Tat.  Doubt any further cardiac work up indicated but will follow with you. Consider pulmonary consult.   Time Spent Directly with Patient: 40 minutes  Erlene Quan [phone] beeper  09/29/2013, 2:55 PM   Attending Note  Patient seen and discussed with PA Kilroy. 79 yo female history of CAD with prior RCA stent, COPD, TIA, PAD, HL, gastric ulcers, admitted with AMS and SOB.   Further cardiac history. She had stenting in 2006 of RCA with DES. Last cath 2008 with patent stent and patent coronaries. LVEF 65-70% by LVgram. 09/2012 MPI with no ischemia, low risk study. LVEF 72%.   Recently started on steroids and abx by pcp in late March for COPD exacerbation with productive cough and worsening SOB without improvement. O2 noted to be low during that visit, arranged for home O2. Today noted to be more lethargic and SOB with worsening confusion. No chest pain. In room patient is coughing up very thick brown mucous.   ER p 79 bp 132/61 T 99.4 RR 22 Sat 90%  ABG 7.31/58/49/29 on 2L [place]. CXR shows RLL opacity and hyperinflation. Initial troponin 0.54, pro-BNP 582, K 3.3, Cr 0.87, GFR 63, Hgb 16.1, WBC 9.9. EKG SR, non-spec ST/T changes  after looking back noted on prior EKGs (12/06/12).   There is no evidence of heart failure as the cause of her hypoxia, evidence consistent with severe pneumonia and possible COPD exacerbation. Her mild elevated trop is likely demand ischemia related to severe systemic illness and severe hypoxia. She is DNR/DNI. Do not recommend any invasive cardiac therapies at this time. Abx and management of COPD per primary team. Continue her home statin and plavix, will hold imdur in setting of severe infection and possible sepsis. Continue to cycle enzymes. Can repeat echo tomorrow AM.    Carlyle Dolly MD

## 2013-09-29 NOTE — ED Provider Notes (Signed)
CSN: [id number]     Arrival date & time 09/29/13  1122 History   First MD Initiated Contact with Patient 09/29/13 1123     Chief Complaint  Patient presents with  . Altered Mental Status     (Consider location/radiation/quality/duration/timing/severity/associated sxs/prior Treatment) Patient is a 79 y.o. female presenting with altered mental status.  Altered Mental Status  Level 5 caveat due to altered mental status Pt with history of multiple medical problems brought to the ED via EMS from home for confusion. She was seen in PCP office about 10 days ago for SOB, cough, started on Prednisone 40mg  and Levaquin. Seen again 3 days ago, not doing any better, increased prednisone to 60mg , changed to Avelox and sent home with oxygen due to hypoxia (87% on RA) in the office. At that time she refused hospitalization. Since that time, she has had little or no improvement. She has continued to have cough with thick sputum (although she is still smoking). Family noticed she was less responsive this morning. She has been wearing her home O2 most of the time except when smoking. She has had very little PO intake the last few days. Pt unable to give any useful history.  Past Medical History  Diagnosis Date  . Low back pain   . Bronchitis   . Chronic hoarseness   . Sleep apnea, obstructive   . Renal artery stenosis   . PVD (peripheral vascular disease)   . Hypercholesteremia   . Pain in limb   . COPD (chronic obstructive pulmonary disease)   . Benign essential hypertension   . Atherosclerosis     coronary vessel type  . Coronary artery disease   . Diverticulosis   . History of GI diverticular bleed   . History of esophagitis   . H. pylori infection   . Altered mental status   . Pneumonia   . TIA (transient ischemic attack) 07/2010  . Arthritis   . GERD (gastroesophageal reflux disease)   . History of blood clots   . Tubular adenoma 2012  . Heart murmur   . CHF (congestive heart failure)   .  Anginal pain   . Myocardial infarct 1990's    "I've only had one" (12/06/2012)  . Exertional shortness of breath   . Daily headache   . Anxiety   . Gastric ulcer 07/2012    large; required clipping Archie Endo 08/23/2012 (12/06/2012)   Past Surgical History  Procedure Laterality Date  . Oophorectomy    . Coronary angioplasty with stent placement  2006    Cypher stent to RCA. On cath in 2008 stent described as widely patent.   . Esophagogastroduodenoscopy N/A 08/21/2012    Procedure: ESOPHAGOGASTRODUODENOSCOPY (EGD);  Surgeon: Lafayette Dragon, MD;  Location: [facility] ENDOSCOPY;  Service: Endoscopy;  Laterality: N/A;  . Vaginal hysterectomy    . Esophagogastroduodenoscopy N/A 12/07/2012    Procedure: ESOPHAGOGASTRODUODENOSCOPY (EGD);  Surgeon: Gatha Mayer, MD;  Location: [facility] ENDOSCOPY;  Service: Endoscopy;  Laterality: N/A;   Family History  Problem Relation Age of Onset  . Heart disease Mother   . Stroke Mother   . Stomach cancer Mother   . Heart disease Father   . Stroke Father   . Heart disease Sister   . Colon cancer Neg Hx   . Lung cancer Sister    History  Substance Use Topics  . Smoking status: Current Every Day Smoker -- 0.50 packs/day for 60 years    Types: Cigarettes  . Smokeless tobacco:  Never Used  . Alcohol Use: No     Comment: occasional   OB History   Grav Para Term Preterm Abortions TAB SAB Ect Mult Living                 Review of Systems  Unable to assess due to mental status.     Allergies  Review of patient's allergies indicates no known allergies.  Home Medications   Current Outpatient Rx  Name  Route  Sig  Dispense  Refill  . albuterol (PROAIR HFA) 108 (90 BASE) MCG/ACT inhaler   Inhalation   Inhale 2 puffs into the lungs every 4 (four) hours as needed for wheezing.   1 Inhaler   6   . albuterol (PROVENTIL) (2.5 MG/3ML) 0.083% nebulizer solution   Nebulization   Take 3 mLs (2.5 mg total) by nebulization every 6 (six) hours as needed for wheezing or  shortness of breath.   150 mL   1   . ALPRAZolam (XANAX) 0.5 MG tablet   Oral   Take 0.5 mg by mouth 2 (two) times daily as needed for anxiety. Every night at bedtime.  Can take during the day if needed.         Marland Kitchen atorvastatin (LIPITOR) 80 MG tablet   Oral   Take 80 mg by mouth at bedtime.         . clopidogrel (PLAVIX) 75 MG tablet   Oral   Take 1 tablet (75 mg total) by mouth daily.   90 tablet   1     Restarted at last cardiology office visit in June  ...   . donepezil (ARICEPT) 5 MG tablet   Oral   Take 1 tablet (5 mg total) by mouth at bedtime.   30 tablet   11   . fluticasone (FLONASE) 50 MCG/ACT nasal spray   Each Nare   Place 2 sprays into both nostrils 2 (two) times daily as needed for allergies or rhinitis.         . fluticasone-salmeterol (ADVAIR HFA) 45-21 MCG/ACT inhaler   Inhalation   Inhale 2 puffs into the lungs 2 (two) times daily as needed (wheezing and shortness of breath).   1 Inhaler   5   . gabapentin (NEURONTIN) 300 MG capsule   Oral   Take 300 mg by mouth at bedtime.         Marland Kitchen ibuprofen (ADVIL,MOTRIN) 200 MG tablet   Oral   Take 400-600 mg by mouth every 6 (six) hours as needed for moderate pain.         . isosorbide mononitrate (IMDUR) 60 MG 24 hr tablet   Oral   Take 60 mg by mouth daily.         Marland Kitchen losartan (COZAAR) 50 MG tablet   Oral   Take 50 mg by mouth daily.         . metoCLOPramide (REGLAN) 5 MG tablet      TAKE 1 TABLET BY MOUTH TWICE A DAY   60 tablet   1   . metoprolol tartrate (LOPRESSOR) 25 MG tablet   Oral   Take 0.5 tablets (12.5 mg total) by mouth 2 (two) times daily.         Marland Kitchen morphine (MS CONTIN) 30 MG 12 hr tablet   Oral   Take 30 mg by mouth. Every 12 hours as needed for pain         . moxifloxacin (AVELOX) 400 MG tablet  Oral   Take 1 tablet (400 mg total) by mouth daily.   7 tablet   0   . Nutritional Supplements (COMPLETE PROTEIN/VITAMIN SHAKE PO)   Oral   Take 2-3 Bottles by  mouth daily.         . ondansetron (ZOFRAN) 8 MG tablet      TAKE 1 TABLET BY MOUTH EVERY 8 HOURS AS NEEDED FOR NAUSEA   50 tablet   0   . oxyCODONE-acetaminophen (PERCOCET) 10-325 MG per tablet   Oral   Take 1 tablet by mouth 5 (five) times daily as needed for pain.         . pantoprazole (PROTONIX) 40 MG tablet   Oral   Take 40 mg by mouth daily.         . predniSONE (DELTASONE) 20 MG tablet   Oral   Take 3 tablets (60 mg total) by mouth daily with breakfast.   15 tablet   0   . topiramate (TOPAMAX) 25 MG tablet   Oral   Take 25 mg by mouth 4 (four) times daily as needed (headach).          BP 132/61  Pulse 79  Temp(Src) 99.4 F (37.4 C) (Oral)  Resp 22  SpO2 90% Physical Exam  Nursing note and vitals reviewed. Constitutional: She appears well-developed.  HENT:  Head: Normocephalic and atraumatic.  Eyes: EOM are normal. Pupils are equal, round, and reactive to light.  Neck: Normal range of motion. Neck supple.  Cardiovascular: Normal rate, normal heart sounds and intact distal pulses.   Pulmonary/Chest: Effort normal. She has wheezes (faint wheeze). She has no rales.  Coarse breath sounds  Abdominal: Bowel sounds are normal. She exhibits no distension. There is no tenderness.  Musculoskeletal: Normal range of motion. She exhibits no edema and no tenderness.  Neurological:  Somnolent, but arousable then falls back asleep, difficulty to fully assess due to mental status  Skin: Skin is warm and dry. No rash noted.    ED Course  Procedures (including critical care time) Labs Review Labs Reviewed  I-STAT ARTERIAL BLOOD GAS, ED - Abnormal; Notable for the following:    pH, Arterial 7.314 (*)    pCO2 arterial 58.5 (*)    pO2, Arterial 49.0 (*)    Bicarbonate 29.7 (*)    All other components within normal limits  CBC WITH DIFFERENTIAL  BASIC METABOLIC PANEL  TROPONIN I  PRO B NATRIURETIC PEPTIDE  URINALYSIS, ROUTINE W REFLEX MICROSCOPIC   Imaging  Review Dg Chest Port 1 View  09/29/2013   CLINICAL DATA:  Altered mental status, hypoxia, oxygen-dependent  EXAM: PORTABLE CHEST - 1 VIEW  COMPARISON:  08/13/2013  FINDINGS: Background hyperinflation noted compatible with COPD/ emphysema. Heart is enlarged but normal vascularity. Negative for CHF. Exam is rotated to the right. Despite this, there is increased opacity in the right base noted concerning for airspace disease or pneumonia. Aspiration could have a similar appearance. Left lung remains clear. No effusion or pneumothorax. Atherosclerosis of the aorta noted. Coronary stents evident.  IMPRESSION: Right base patchy airspace process concerning for pneumonia  Background COPD/emphysema   Electronically Signed   By: Daryll Brod M.D.   On: 09/29/2013 11:58     EKG Interpretation   Date/Time:  Sunday September 29 2013 11:30:03 EDT Ventricular Rate:  76 PR Interval:  121 QRS Duration: 77 QT Interval:  375 QTC Calculation: 422 R Axis:   57 Text Interpretation:  Sinus rhythm Minimal ST depression,  diffuse leads No  significant change since last tracing Confirmed by [facility]  MD, Juanda Crumble  [phone]) on 09/29/2013 11:37:48 AM      MDM   Final diagnoses:  HCAP (healthcare-associated pneumonia)  Respiratory acidosis  Altered mental status  Elevated troponin    CXR concerning for PNA, given recent treatment with Levaquin and Avelox, will treat as HCAP. Also has a partially compensated respiratory acidosis which may be contributing to somnolence. She is also hypoxic on 2L, switched to a vente mask to keep SpO2 90-92%. Admit for further eval.     Charles B. Karle Starch, MD 10/02/13 [phone]

## 2013-09-29 NOTE — ED Notes (Signed)
Troponin of 0.54 called in by lab; report to Dr. Karle Starch

## 2013-09-30 ENCOUNTER — Ambulatory Visit: Payer: 59 | Admitting: Family Medicine

## 2013-09-30 DIAGNOSIS — R51 Headache: Secondary | ICD-10-CM

## 2013-09-30 DIAGNOSIS — I214 Non-ST elevation (NSTEMI) myocardial infarction: Secondary | ICD-10-CM

## 2013-09-30 DIAGNOSIS — J189 Pneumonia, unspecified organism: Secondary | ICD-10-CM

## 2013-09-30 DIAGNOSIS — E43 Unspecified severe protein-calorie malnutrition: Secondary | ICD-10-CM

## 2013-09-30 LAB — CBC
HCT: 44.5 % (ref 36.0–46.0)
Hemoglobin: 14.6 g/dL (ref 12.0–15.0)
MCH: 31.3 pg (ref 26.0–34.0)
MCHC: 32.8 g/dL (ref 30.0–36.0)
MCV: 95.3 fL (ref 78.0–100.0)
Platelets: 189 10*3/uL (ref 150–400)
RBC: 4.67 MIL/uL (ref 3.87–5.11)
RDW: 13.1 % (ref 11.5–15.5)
WBC: 9.9 10*3/uL (ref 4.0–10.5)

## 2013-09-30 LAB — BASIC METABOLIC PANEL
BUN: 15 mg/dL (ref 6–23)
CO2: 26 mEq/L (ref 19–32)
CREATININE: 0.62 mg/dL (ref 0.50–1.10)
Calcium: 8.8 mg/dL (ref 8.4–10.5)
Chloride: 104 mEq/L (ref 96–112)
GFR, EST NON AFRICAN AMERICAN: 85 mL/min — AB (ref >90)
Glucose, Bld: 115 mg/dL — ABNORMAL HIGH (ref 70–99)
Potassium: 3.7 mEq/L (ref 3.7–5.3)
Sodium: 144 mEq/L (ref 137–147)

## 2013-09-30 LAB — TROPONIN I: Troponin I: 0.51 ng/mL (ref <0.30)

## 2013-09-30 MED ORDER — MORPHINE SULFATE 30 MG PO TB12: 30.0000 mg | ORAL_TABLET | Freq: Two times a day (BID) | ORAL | Status: DC

## 2013-09-30 MED ORDER — NICOTINE 21 MG/24HR TD PT24
21.0000 mg | MEDICATED_PATCH | TRANSDERMAL | Status: DC
  Administered 2013-09-30 – 2013-10-02 (×3): 21 mg via TRANSDERMAL
  Filled 2013-09-30 (×4): qty 1

## 2013-09-30 MED ORDER — OXYCODONE HCL ER 10 MG PO T12A
10.0000 mg | EXTENDED_RELEASE_TABLET | Freq: Two times a day (BID) | ORAL | Status: DC
  Administered 2013-09-30 – 2013-10-01 (×3): 10 mg via ORAL
  Filled 2013-09-30 (×3): qty 1

## 2013-09-30 MED ORDER — SODIUM CHLORIDE 0.9 % IV SOLN
1.5000 g | Freq: Four times a day (QID) | INTRAVENOUS | Status: DC
  Administered 2013-09-30 – 2013-10-02 (×8): 1.5 g via INTRAVENOUS
  Filled 2013-09-30 (×11): qty 1.5

## 2013-09-30 MED ORDER — IPRATROPIUM-ALBUTEROL 0.5-2.5 (3) MG/3ML IN SOLN
3.0000 mL | Freq: Three times a day (TID) | RESPIRATORY_TRACT | Status: DC
  Administered 2013-09-30 – 2013-10-03 (×8): 3 mL via RESPIRATORY_TRACT
  Filled 2013-09-30 (×10): qty 3

## 2013-09-30 MED ORDER — ISOSORBIDE MONONITRATE ER 60 MG PO TB24
60.0000 mg | ORAL_TABLET | Freq: Every evening | ORAL | Status: DC
  Administered 2013-09-30 – 2013-10-02 (×3): 60 mg via ORAL
  Filled 2013-09-30 (×5): qty 1

## 2013-09-30 MED ORDER — LOSARTAN POTASSIUM 50 MG PO TABS
50.0000 mg | ORAL_TABLET | Freq: Every day | ORAL | Status: DC
  Administered 2013-09-30 – 2013-10-03 (×4): 50 mg via ORAL
  Filled 2013-09-30 (×4): qty 1

## 2013-09-30 NOTE — Progress Notes (Signed)
Utilization Review Completed.Donne Anon T4/11/2013

## 2013-09-30 NOTE — Progress Notes (Signed)
Patient had a 14 beat run of SVT at 0735 HR was in the 140's.  Patient asymptomatic.  MD notified.  Will continue to monitor.

## 2013-09-30 NOTE — Progress Notes (Signed)
Subjective:  No CP. SOB somewhat better  Objective:  Temp:  [97.8 F (36.6 C)-98.7 F (37.1 C)] 98.7 F (37.1 C) (04/06 0500) Pulse Rate:  [58-81] 81 (04/06 1100) Resp:  [18-22] 18 (04/06 0500) BP: (125-150)/(49-101) 148/62 mmHg (04/06 1100) SpO2:  [88 %-98 %] 92 % (04/06 0808) Weight:  [87 lb 1.3 oz (39.5 kg)-88 lb 6.5 oz (40.1 kg)] 87 lb 1.3 oz (39.5 kg) (04/06 0500) Weight change:   Intake/Output from previous day: 04/05 0701 - 04/06 0700 In: 1620 [P.O.:720; I.V.:900] Out: 500 [Urine:500]  Intake/Output from this shift: Total I/O In: 120 [P.O.:120] Out: 400 [Urine:400]  Physical Exam: General appearance: alert, appears older than stated age and cachectic Neck: no adenopathy, no carotid bruit, no JVD, supple, symmetrical, trachea midline and thyroid not enlarged, symmetric, no tenderness/mass/nodules Lungs: clear to auscultation bilaterally Heart: regular rate and rhythm, S1, S2 normal, no murmur, click, rub or gallop Extremities: extremities normal, atraumatic, no cyanosis or edema  Lab Results: Results for orders placed during the hospital encounter of 09/29/13 (from the past 48 hour(s))  CBC WITH DIFFERENTIAL     Status: Abnormal   Collection Time    09/29/13 11:55 AM      Result Value Ref Range   WBC 9.9  4.0 - 10.5 K/uL   RBC 5.00  3.87 - 5.11 MIL/uL   Hemoglobin 16.1 (*) 12.0 - 15.0 g/dL   HCT 48.4 (*) 36.0 - 46.0 %   MCV 96.8  78.0 - 100.0 fL   MCH 32.2  26.0 - 34.0 pg   MCHC 33.3  30.0 - 36.0 g/dL   RDW 13.3  11.5 - 15.5 %   Platelets 201  150 - 400 K/uL   Neutrophils Relative % 73  43 - 77 %   Neutro Abs 7.2  1.7 - 7.7 K/uL   Lymphocytes Relative 19  12 - 46 %   Lymphs Abs 1.9  0.7 - 4.0 K/uL   Monocytes Relative 7  3 - 12 %   Monocytes Absolute 0.7  0.1 - 1.0 K/uL   Eosinophils Relative 1  0 - 5 %   Eosinophils Absolute 0.1  0.0 - 0.7 K/uL   Basophils Relative 0  0 - 1 %   Basophils Absolute 0.0  0.0 - 0.1 K/uL  BASIC METABOLIC PANEL      Status: Abnormal   Collection Time    09/29/13 11:55 AM      Result Value Ref Range   Sodium 145  137 - 147 mEq/L   Potassium 3.3 (*) 3.7 - 5.3 mEq/L   Chloride 102  96 - 112 mEq/L   CO2 29  19 - 32 mEq/L   Glucose, Bld 105 (*) 70 - 99 mg/dL   BUN 20  6 - 23 mg/dL   Creatinine, Ser 0.87  0.50 - 1.10 mg/dL   Calcium 9.1  8.4 - 10.5 mg/dL   GFR calc non Af Amer 63 (*) >90 mL/min   GFR calc Af Amer 73 (*) >90 mL/min   Comment: (NOTE)     The eGFR has been calculated using the CKD EPI equation.     This calculation has not been validated in all clinical situations.     eGFR's persistently <90 mL/min signify possible Chronic Kidney     Disease.  TROPONIN I     Status: Abnormal   Collection Time    09/29/13 11:55 AM      Result Value Ref Range  Troponin I 0.54 (*) <0.30 ng/mL   Comment:            Due to the release kinetics of cTnI,     a negative result within the first hours     of the onset of symptoms does not rule out     myocardial infarction with certainty.     If myocardial infarction is still suspected,     repeat the test at appropriate intervals.     CRITICAL RESULT CALLED TO, READ BACK BY AND VERIFIED WITH:     T.MITCHELL,RN 1301 09/29/13 M.CAMPBELL  PRO B NATRIURETIC PEPTIDE     Status: Abnormal   Collection Time    09/29/13 11:55 AM      Result Value Ref Range   Pro B Natriuretic peptide (BNP) 582.1 (*) 0 - 450 pg/mL  I-STAT ARTERIAL BLOOD GAS, ED     Status: Abnormal   Collection Time    09/29/13 12:09 PM      Result Value Ref Range   pH, Arterial 7.314 (*) 7.350 - 7.450   pCO2 arterial 58.5 (*) 35.0 - 45.0 mmHg   pO2, Arterial 49.0 (*) 80.0 - 100.0 mmHg   Bicarbonate 29.7 (*) 20.0 - 24.0 mEq/L   TCO2 31  0 - 100 mmol/L   O2 Saturation 80.0     Acid-Base Excess 2.0  0.0 - 2.0 mmol/L   Collection site RADIAL, ALLEN'S TEST ACCEPTABLE     Drawn by RT     Sample type ARTERIAL     Comment NOTIFIED PHYSICIAN    URINALYSIS, ROUTINE W REFLEX MICROSCOPIC      Status: Abnormal   Collection Time    09/29/13 12:23 PM      Result Value Ref Range   Color, Urine AMBER (*) YELLOW   Comment: BIOCHEMICALS MAY BE AFFECTED BY COLOR   APPearance CLOUDY (*) CLEAR   Specific Gravity, Urine 1.030  1.005 - 1.030   pH 6.0  5.0 - 8.0   Glucose, UA NEGATIVE  NEGATIVE mg/dL   Hgb urine dipstick NEGATIVE  NEGATIVE   Bilirubin Urine SMALL (*) NEGATIVE   Ketones, ur NEGATIVE  NEGATIVE mg/dL   Protein, ur 30 (*) NEGATIVE mg/dL   Urobilinogen, UA 1.0  0.0 - 1.0 mg/dL   Nitrite NEGATIVE  NEGATIVE   Leukocytes, UA NEGATIVE  NEGATIVE  URINE MICROSCOPIC-ADD ON     Status: Abnormal   Collection Time    09/29/13 12:23 PM      Result Value Ref Range   Squamous Epithelial / LPF FEW (*) RARE   WBC, UA 0-2  <3 WBC/hpf   Bacteria, UA FEW (*) RARE   Urine-Other LESS THAN 10 mL OF URINE SUBMITTED    CULTURE, BLOOD (ROUTINE X 2)     Status: None   Collection Time    09/29/13  2:01 PM      Result Value Ref Range   Specimen Description BLOOD RIGHT ARM     Special Requests BOTTLES DRAWN AEROBIC ONLY 5 CC     Culture  Setup Time       Value: 09/29/2013 20:08     Performed at [facility]   Culture       Value:        BLOOD CULTURE RECEIVED NO GROWTH TO DATE CULTURE WILL BE HELD FOR 5 DAYS BEFORE ISSUING A FINAL NEGATIVE REPORT     Performed at [facility]   Report Status PENDING  CULTURE, BLOOD (ROUTINE X 2)     Status: None   Collection Time    09/29/13  2:01 PM      Result Value Ref Range   Specimen Description BLOOD RIGHT ARM     Special Requests BOTTLES DRAWN AEROBIC AND ANAEROBIC 5CC     Culture  Setup Time       Value: 09/29/2013 20:08     Performed at [facility]   Culture       Value:        BLOOD CULTURE RECEIVED NO GROWTH TO DATE CULTURE WILL BE HELD FOR 5 DAYS BEFORE ISSUING A FINAL NEGATIVE REPORT     Performed at [facility]   Report Status PENDING    TROPONIN I     Status: Abnormal   Collection Time    09/29/13   6:44 PM      Result Value Ref Range   Troponin I 0.48 (*) <0.30 ng/mL   Comment:            Due to the release kinetics of cTnI,     a negative result within the first hours     of the onset of symptoms does not rule out     myocardial infarction with certainty.     If myocardial infarction is still suspected,     repeat the test at appropriate intervals.     CRITICAL VALUE NOTED.  VALUE IS CONSISTENT WITH PREVIOUSLY REPORTED AND CALLED VALUE.  TROPONIN I     Status: Abnormal   Collection Time    09/30/13  1:36 AM      Result Value Ref Range   Troponin I 0.51 (*) <0.30 ng/mL   Comment:            Due to the release kinetics of cTnI,     a negative result within the first hours     of the onset of symptoms does not rule out     myocardial infarction with certainty.     If myocardial infarction is still suspected,     repeat the test at appropriate intervals.     CRITICAL VALUE NOTED.  VALUE IS CONSISTENT WITH PREVIOUSLY REPORTED AND CALLED VALUE.  BASIC METABOLIC PANEL     Status: Abnormal   Collection Time    09/30/13  1:36 AM      Result Value Ref Range   Sodium 144  137 - 147 mEq/L   Potassium 3.7  3.7 - 5.3 mEq/L   Chloride 104  96 - 112 mEq/L   CO2 26  19 - 32 mEq/L   Glucose, Bld 115 (*) 70 - 99 mg/dL   BUN 15  6 - 23 mg/dL   Creatinine, Ser 0.62  0.50 - 1.10 mg/dL   Calcium 8.8  8.4 - 10.5 mg/dL   GFR calc non Af Amer 85 (*) >90 mL/min   GFR calc Af Amer >90  >90 mL/min   Comment: (NOTE)     The eGFR has been calculated using the CKD EPI equation.     This calculation has not been validated in all clinical situations.     eGFR's persistently <90 mL/min signify possible Chronic Kidney     Disease.  CBC     Status: None   Collection Time    09/30/13  1:36 AM      Result Value Ref Range   WBC 9.9  4.0 - 10.5 K/uL   RBC 4.67  3.87 - 5.11 MIL/uL   Hemoglobin 14.6  12.0 - 15.0 g/dL   HCT 44.5  36.0 - 46.0 %   MCV 95.3  78.0 - 100.0 fL   MCH 31.3  26.0 - 34.0 pg    MCHC 32.8  30.0 - 36.0 g/dL   RDW 13.1  11.5 - 15.5 %   Platelets 189  150 - 400 K/uL    Imaging: Imaging results have been reviewed  Assessment/Plan:   1. Principal Problem: 2.   Acute on chronic respiratory failure 3. Active Problems: 4.   HYPERCHOLESTEROLEMIA 5.   HYPERTENSION, BENIGN ESSENTIAL 6.   COPD 7.   GI bleed- Feb 2014, June 2014 8.   CAD- tandem Cyper DES to prox. and Mid RCA 2006, stable at cath 2008, low risk Myoview March 2014 9.   Protein-calorie malnutrition, severe 10.   Acute encephalopathy 11.   Aspiration pneumonia 12.   Elevated troponin 13.   Time Spent Directly with Patient:  20 minutes  Length of Stay:  LOS: 1 day   Pt admitted for COPD/CAP. Trop mildly elevated. EKG w/o acute changes. Exam benign. On ATBX. Pt was made a DNR/DNI. 2D pending. If nl will s/o. No invasive strategy anticipated.   Lorretta Harp 09/30/2013, 12:22 PM

## 2013-09-30 NOTE — Progress Notes (Addendum)
[facility] PROGRESS NOTE Interim History: 79 y.o. female with a past medical history significant for severe COPD, chronic pain on narcotics, CAD s/p RCA DES x 2 in '06, and GI bleeding Feb 2014 secondary to PUD. She is on Plavix, no ASA. She is cachectic - 90 lbs. She has continued to smoke. She recently saw her primary care provider and was put on antibiotics and steroids as an OP for a COPD exacerbation. She was seen in follow up and not improved and home O2 at 2 L was added. She presents to the ER now lethargic and confused per family.   Assessment/Plan: Aspiration pneumonia /COPD exacerbation: - Chest x-ray opacity there is a high clinical suspicion for aspiration pneumonitis. ? If due to narcotics use. - Blood cultures have been obtained  - Deescalate to Unasyn cont Vanc. - SLP, previous history of PNA (2014)  Acute respiratory failure: - The patient continues to smoke one pack per day  - Aerosolized albuterol and Atrovent  - Pulmonary hygiene - Start IV steroids.  Chronic pain/opioid dependence  -Minimize opioids  -resume  Acute encephalopathy  - Secondary to medications as well as infectious process and respiratory failure  - According to the patient's daughter, there is concern that the patient over uses her opioids  - The patient is more alert than when she was at home according to the patient's family   Elevated troponin  -This is likely demand ischemia although cannot rule out type II NSTEMI  -Cardiology consultation: echo pending no further cardiac w/u.  Hypertension  -Continue metoprolol tartrate  - resume home meds -Continue Plavix   Myoclonus  -Likely due to the patient's gabapentin which has been discontinued   Severe prot. Malnutrition:  Code Status: DNR Family Communication: none  Disposition Plan: inpatient   Consultants:  cardiology  Procedures:  Echo  cxr  Antibiotics:  Vanc & Unasyn 4.6.2015  HPI/Subjective: Complaing of  HA's   Objective: Filed Vitals:   09/30/13 0244 09/30/13 0412 09/30/13 0500 09/30/13 0808  BP:  148/70 150/76   Pulse:  62 58   Temp:  98.4 F (36.9 C) 98.7 F (37.1 C)   TempSrc:  Oral Oral   Resp:  18 18   Height:      Weight:   39.5 kg (87 lb 1.3 oz)   SpO2: 92% 98% 94% 92%    Intake/Output Summary (Last 24 hours) at 09/30/13 0949 Last data filed at 09/30/13 0900  Gross per 24 hour  Intake   1740 ml  Output    700 ml  Net   1040 ml   Filed Weights   09/29/13 1518 09/30/13 0500  Weight: 40.1 kg (88 lb 6.5 oz) 39.5 kg (87 lb 1.3 oz)    Exam:  General: Alert, awake, oriented x3, in no acute distress. cachectic HEENT: No bruits, no goiter.  Heart: Regular rate and rhythm, without murmurs, rubs, gallops.  Lungs: Good air movement, crackles on right, with wheezing Abdomen: Soft, nontender, nondistended, positive bowel sounds.     Data Reviewed: Basic Metabolic Panel:  Recent Labs Lab 09/29/13 1155 09/30/13 0136  NA 145 144  K 3.3* 3.7  CL 102 104  CO2 29 26  GLUCOSE 105* 115*  BUN 20 15  CREATININE 0.87 0.62  CALCIUM 9.1 8.8   Liver Function Tests: No results found for this basename: AST, ALT, ALKPHOS, BILITOT, PROT, ALBUMIN,  in the last 168 hours No results found for this basename: LIPASE, AMYLASE,  in  the last 168 hours No results found for this basename: AMMONIA,  in the last 168 hours CBC:  Recent Labs Lab 09/29/13 1155 09/30/13 0136  WBC 9.9 9.9  NEUTROABS 7.2  --   HGB 16.1* 14.6  HCT 48.4* 44.5  MCV 96.8 95.3  PLT 201 189   Cardiac Enzymes:  Recent Labs Lab 09/29/13 1155 09/29/13 1844 09/30/13 0136  TROPONINI 0.54* 0.48* 0.51*   BNP (last 3 results)  Recent Labs  09/29/13 1155  PROBNP 582.1*   CBG: No results found for this basename: GLUCAP,  in the last 168 hours  Recent Results (from the past 240 hour(s))  CULTURE, BLOOD (ROUTINE X 2)     Status: None   Collection Time    09/29/13  2:01 PM      Result Value Ref  Range Status   Specimen Description BLOOD RIGHT ARM   Final   Special Requests BOTTLES DRAWN AEROBIC ONLY 5 CC   Final   Culture  Setup Time     Final   Value: 09/29/2013 20:08     Performed at [facility]   Culture     Final   Value:        BLOOD CULTURE RECEIVED NO GROWTH TO DATE CULTURE WILL BE HELD FOR 5 DAYS BEFORE ISSUING A FINAL NEGATIVE REPORT     Performed at [facility]   Report Status PENDING   Incomplete  CULTURE, BLOOD (ROUTINE X 2)     Status: None   Collection Time    09/29/13  2:01 PM      Result Value Ref Range Status   Specimen Description BLOOD RIGHT ARM   Final   Special Requests BOTTLES DRAWN AEROBIC AND ANAEROBIC 5CC   Final   Culture  Setup Time     Final   Value: 09/29/2013 20:08     Performed at [facility]   Culture     Final   Value:        BLOOD CULTURE RECEIVED NO GROWTH TO DATE CULTURE WILL BE HELD FOR 5 DAYS BEFORE ISSUING A FINAL NEGATIVE REPORT     Performed at [facility]   Report Status PENDING   Incomplete     Studies: Dg Chest Port 1 View  09/29/2013   CLINICAL DATA:  Altered mental status, hypoxia, oxygen-dependent  EXAM: PORTABLE CHEST - 1 VIEW  COMPARISON:  08/13/2013  FINDINGS: Background hyperinflation noted compatible with COPD/ emphysema. Heart is enlarged but normal vascularity. Negative for CHF. Exam is rotated to the right. Despite this, there is increased opacity in the right base noted concerning for airspace disease or pneumonia. Aspiration could have a similar appearance. Left lung remains clear. No effusion or pneumothorax. Atherosclerosis of the aorta noted. Coronary stents evident.  IMPRESSION: Right base patchy airspace process concerning for pneumonia  Background COPD/emphysema   Electronically Signed   By: Daryll Brod M.D.   On: 09/29/2013 11:58    Scheduled Meds: . atorvastatin  80 mg Oral QHS  . clopidogrel  75 mg Oral Daily  . donepezil  5 mg Oral QHS  . heparin  5,000 Units Subcutaneous  3 times per day  . ipratropium-albuterol  3 mL Nebulization Q6H  . metoCLOPramide  5 mg Oral BID  . metoprolol tartrate  12.5 mg Oral BID  . mometasone-formoterol  2 puff Inhalation BID  . pantoprazole  40 mg Oral Daily  . piperacillin-tazobactam (ZOSYN)  IV  3.375 g Intravenous Q8H  .  topiramate  50 mg Oral Daily  . vancomycin  500 mg Intravenous Q24H   Continuous Infusions: . dextrose 5 % and 0.45% NaCl 1,000 mL with potassium chloride 40 mEq infusion       Charlynne Cousins  [facility] Pager [phone]. If 8PM-8AM, please contact night-coverage at [email or web address], password [facility] 09/30/2013, 9:49 AM  LOS: 1 day

## 2013-09-30 NOTE — Progress Notes (Signed)
Patient with episode of nausea.  Zofran administered x1 per MAR. RN will continue to monitor. Shellee Milo, RN

## 2013-09-30 NOTE — Evaluation (Signed)
Clinical/Bedside Swallow Evaluation Patient Details  Name: Kathy Marshall MRN: [id number] Date of Birth: [birth year]  Today's Date: 09/30/2013 Time: 0330-0430 SLP Time Calculation (min): 60 min  Past Medical History:  Past Medical History  Diagnosis Date  . Low back pain   . Bronchitis   . Chronic hoarseness   . Sleep apnea, obstructive   . Renal artery stenosis   . PVD (peripheral vascular disease)   . Hypercholesteremia   . Pain in limb   . COPD (chronic obstructive pulmonary disease)   . Benign essential hypertension   . Atherosclerosis     coronary vessel type  . Coronary artery disease   . Diverticulosis   . History of GI diverticular bleed   . History of esophagitis   . H. pylori infection   . Altered mental status   . Pneumonia   . TIA (transient ischemic attack) 07/2010  . Arthritis   . GERD (gastroesophageal reflux disease)   . History of blood clots   . Tubular adenoma 2012  . Heart murmur   . CHF (congestive heart failure)   . Anginal pain   . Myocardial infarct 1990's    "I've only had one" (12/06/2012)  . Exertional shortness of breath   . Daily headache   . Anxiety   . Gastric ulcer 07/2012    large; required clipping Archie Endo 08/23/2012 (12/06/2012)   Past Surgical History:  Past Surgical History  Procedure Laterality Date  . Oophorectomy    . Coronary angioplasty with stent placement  2006    Cypher stent to RCA. On cath in 2008 stent described as widely patent.   . Esophagogastroduodenoscopy N/A 08/21/2012    Procedure: ESOPHAGOGASTRODUODENOSCOPY (EGD);  Surgeon: Lafayette Dragon, MD;  Location: [facility] ENDOSCOPY;  Service: Endoscopy;  Laterality: N/A;  . Vaginal hysterectomy    . Esophagogastroduodenoscopy N/A 12/07/2012    Procedure: ESOPHAGOGASTRODUODENOSCOPY (EGD);  Surgeon: Gatha Mayer, MD;  Location[facility] ENDOSCOPY;  Service: Endoscopy;  Laterality: N/A;   HPI:  79 y.o. female with a past medical history significant for severe COPD, chronic pain on  narcotics, CAD s/p RCA DES x 2 in '06, and GI bleeding Feb 2014 secondary to PUD. She is cachectic - 90 lbs. She has continued to smoke. MBS report 01/19/2013 was found to be unremarkable. She presented to the ER lethargic and confused with altered mental status and hypoxia. Chest View showed clear left lung with no effusion of pneumothorax but with Rt base patch airspace process concerning for pneumonia. Bedside swallow evaluation ordered to r/o aspiration.   Assessment / Plan / Recommendation Clinical Impression  Kathy Marshall was seen sitting upright for a bedside swallow evaluation and demonstrated no overt s/sx of penetration/aspiration. She demonstrated a congested cough upon the clinician entering the room prior to any PO intake and demonstrated a cough x2 after a significant delay of cup sips of thin likely due to congestion rather than consumption of thin.  Her daughter was present for the evaluation and reports that  Kathy Marshall gets "strangled" ~ 2x/week during PO intake and that she avoids specific textures that cause this; daughter also reports cutting up meats and other food into small bites to avoid getting "choked". Pt and family were educated in standard swallowing precautions and were encouraged to continue to cut up Kathy Marshall's food and for her to take small bites/sips. No f/u needed SLP signing off. Agree with Student Assessment. Pt had objective swallow study 9 months ago WNL, Pt has  had little function change and continues to demonstrate typical swallow subjectively. Suspect occasional choking episode secondary to solid texture problem rather than aspiration.     Aspiration Risk  Mild    Diet Recommendation Regular;Thin liquid   Liquid Administration via: Cup;Straw Medication Administration: Whole meds with liquid Supervision: Patient able to self feed Compensations: Small sips/bites;Slow rate Postural Changes and/or Swallow Maneuvers: Seated upright 90 degrees;Upright  30-60 min after meal    Other  Recommendations Oral Care Recommendations: Oral care BID   Follow Up Recommendations  None    Frequency and Duration        Pertinent Vitals/Pain N/A    SLP Swallow Goals     Swallow Study Prior Functional Status       General Date of Onset: 09/29/13 HPI: 79 y.o. female with a past medical history significant for severe COPD, chronic pain on narcotics, CAD s/p RCA DES x 2 in '06, and GI bleeding Feb 2014 secondary to PUD. She is cachectic - 90 lbs. She has continued to smoke. MBS report 01/19/2013 was found to be unremarkable. She presented to the ER lethargic and confused with altered mental status and hypoxia. Chest View showed clear left lung with no effusion of pneumothorax but with Rt base patch airspace process concerning for pneumonia. Bedside swallow evaluation ordered to r/o aspiration. Type of Study: Bedside swallow evaluation Previous Swallow Assessment: MBS 01/19/2013 Diet Prior to this Study: Regular;Thin liquids Respiratory Status: Room air Behavior/Cognition: Alert;Cooperative;Pleasant mood Oral Cavity - Dentition: Dentures, top;Dentures, bottom (Daughter reports bottom dentures are loose) Self-Feeding Abilities: Able to feed self Patient Positioning: Upright in bed Baseline Vocal Quality: Breathy;Hoarse (at baseline due to history of smoking ) Volitional Cough: Strong Volitional Swallow: Able to elicit    Oral/Motor/Sensory Function Overall Oral Motor/Sensory Function: Appears within functional limits for tasks assessed Labial ROM: Within Functional Limits Labial Symmetry: Within Functional Limits Labial Strength: Within Functional Limits Lingual ROM: Within Functional Limits Lingual Symmetry: Within Functional Limits Facial ROM: Within Functional Limits Facial Symmetry: Within Functional Limits Velum: Within Functional Limits   Ice Chips     Thin Liquid Thin Liquid: Within functional limits Presentation: Cup;Self Fed;Straw     Nectar Thick Nectar Thick Liquid: Not tested   Honey Thick Honey Thick Liquid: Not tested   Puree Puree: Within functional limits Presentation:  (pudding)   Solid       Thank you, [facility], SLP-Student [phone] Solid: Within functional limits Presentation: Self Fed (cracker)       Hopkins,Amelia 09/30/2013,4:56 PM  Cosigned by Herbie [place], [place] CCC-SLP [phone]

## 2013-10-01 DIAGNOSIS — R634 Abnormal weight loss: Secondary | ICD-10-CM

## 2013-10-01 DIAGNOSIS — I10 Essential (primary) hypertension: Secondary | ICD-10-CM

## 2013-10-01 DIAGNOSIS — I359 Nonrheumatic aortic valve disorder, unspecified: Secondary | ICD-10-CM

## 2013-10-01 DIAGNOSIS — J9620 Acute and chronic respiratory failure, unspecified whether with hypoxia or hypercapnia: Principal | ICD-10-CM

## 2013-10-01 MED ORDER — HYDROCORTISONE 20 MG PO TABS
20.0000 mg | ORAL_TABLET | Freq: Two times a day (BID) | ORAL | Status: DC
  Administered 2013-10-01 – 2013-10-03 (×5): 20 mg via ORAL
  Filled 2013-10-01 (×6): qty 1

## 2013-10-01 MED ORDER — TOPIRAMATE 25 MG PO TABS
25.0000 mg | ORAL_TABLET | Freq: Every day | ORAL | Status: DC
  Administered 2013-10-02 – 2013-10-03 (×2): 25 mg via ORAL
  Filled 2013-10-01 (×2): qty 1

## 2013-10-01 MED ORDER — OXYCODONE-ACETAMINOPHEN 5-325 MG PO TABS
1.0000 | ORAL_TABLET | ORAL | Status: DC | PRN
  Administered 2013-10-01 – 2013-10-03 (×5): 1 via ORAL
  Filled 2013-10-01 (×5): qty 1

## 2013-10-01 MED ORDER — GUAIFENESIN ER 600 MG PO TB12
1200.0000 mg | ORAL_TABLET | Freq: Two times a day (BID) | ORAL | Status: DC
  Administered 2013-10-01 – 2013-10-03 (×5): 1200 mg via ORAL
  Filled 2013-10-01 (×6): qty 2

## 2013-10-01 MED ORDER — OXYCODONE-ACETAMINOPHEN 5-325 MG PO TABS: 1.0000 | ORAL_TABLET | Freq: Four times a day (QID) | ORAL | Status: DC | PRN

## 2013-10-01 NOTE — Progress Notes (Signed)
Patient Name: Kathy Marshall Date of Encounter: 10/01/2013  Principal Problem:   Acute on chronic respiratory failure Active Problems:   HYPERCHOLESTEROLEMIA   HYPERTENSION, BENIGN ESSENTIAL   COPD   GI bleed- Feb 2014, June 2014   CAD- tandem Cyper DES to prox. and Mid RCA 2006, stable at cath 2008, low risk Myoview March 2014   Protein-calorie malnutrition, severe   Acute encephalopathy   Aspiration pneumonia   Elevated troponin    Patient Profile: 79 yo female w/ hx severe COPD, Tob use, chronic pain on narcotics, CAD s/p RCA DES x 2 in '06, and GIB was admitted 04/05 w/ lethargy, cards seeing for elevated troponin. Pt now DNR  SUBJECTIVE: No chest pain, coughing at times, non-productive. Requesting Mucinex which she takes at home. Breathing a little better.  OBJECTIVE Filed Vitals:   10/01/13 0210 10/01/13 0600 10/01/13 0839 10/01/13 0906  BP: 153/84 169/89 162/94 147/91  Pulse: 71 68 71 82  Temp: 98.9 F (37.2 C) 99.4 F (37.4 C)    TempSrc: Oral Oral    Resp: 18 18    Height:      Weight:  88 lb 2.9 oz (40 kg)    SpO2: 95% 98% 93%     Intake/Output Summary (Last 24 hours) at 10/01/13 0912 Last data filed at 10/01/13 0848  Gross per 24 hour  Intake    600 ml  Output   1300 ml  Net   -700 ml   Filed Weights   09/29/13 1518 09/30/13 0500 10/01/13 0600  Weight: 88 lb 6.5 oz (40.1 kg) 87 lb 1.3 oz (39.5 kg) 88 lb 2.9 oz (40 kg)    PHYSICAL EXAM General: Well developed, cachectic, female in no acute distress. Head: Normocephalic, atraumatic.  Neck: Supple without bruits, JVD at 8 cm. Lungs:  Resp regular and unlabored, few rales, ++ rhonchi. Heart: slightly Irreg R&R, S1, S2, no S3, S4, soft murmur; no rub. Abdomen: Soft, non-tender, non-distended, BS + x 4.  Extremities: No clubbing, cyanosis, no edema.  Neuro: Alert and oriented X 3. Moves all extremities spontaneously. Psych: Normal affect.  LABS: CBC: Recent Labs  09/29/13 1155  09/30/13 0136  WBC 9.9 9.9  NEUTROABS 7.2  --   HGB 16.1* 14.6  HCT 48.4* 44.5  MCV 96.8 95.3  PLT 201 637   Basic Metabolic Panel: Recent Labs  09/29/13 1155 09/30/13 0136  NA 145 144  K 3.3* 3.7  CL 102 104  CO2 29 26  GLUCOSE 105* 115*  BUN 20 15  CREATININE 0.87 0.62  CALCIUM 9.1 8.8   Cardiac Enzymes: Recent Labs  09/29/13 1155 09/29/13 1844 09/30/13 0136  TROPONINI 0.54* 0.48* 0.51*   BNP: Pro B Natriuretic peptide (BNP)  Date/Time Value Ref Range Status  09/29/2013 11:55 AM 582.1* 0 - 450 pg/mL Final  03/19/2009  5:40 PM 38.0  0.0 - 100.0 pg/mL Final    TELE:   SR, PVCs     Radiology/Studies: Dg Chest Port 1 View  09/29/2013   CLINICAL DATA:  Altered mental status, hypoxia, oxygen-dependent  EXAM: PORTABLE CHEST - 1 VIEW  COMPARISON:  08/13/2013  FINDINGS: Background hyperinflation noted compatible with COPD/ emphysema. Heart is enlarged but normal vascularity. Negative for CHF. Exam is rotated to the right. Despite this, there is increased opacity in the right base noted concerning for airspace disease or pneumonia. Aspiration could have a similar appearance. Left lung remains clear. No effusion or pneumothorax. Atherosclerosis of the  aorta noted. Coronary stents evident.  IMPRESSION: Right base patchy airspace process concerning for pneumonia  Background COPD/emphysema   Electronically Signed   By: Daryll Brod M.D.   On: 09/29/2013 11:58     Current Medications:  . ampicillin-sulbactam (UNASYN) IV  1.5 g Intravenous Q6H  . atorvastatin  80 mg Oral QHS  . clopidogrel  75 mg Oral Daily  . donepezil  5 mg Oral QHS  . heparin  5,000 Units Subcutaneous 3 times per day  . ipratropium-albuterol  3 mL Nebulization TID  . isosorbide mononitrate  60 mg Oral QPM  . losartan  50 mg Oral Daily  . metoCLOPramide  5 mg Oral BID  . metoprolol tartrate  12.5 mg Oral BID  . mometasone-formoterol  2 puff Inhalation BID  . nicotine  21 mg Transdermal Q24H  . OxyCODONE   10 mg Oral Q12H  . pantoprazole  40 mg Oral Daily  . topiramate  50 mg Oral Daily  . vancomycin  500 mg Intravenous Q24H      ASSESSMENT AND PLAN:   Elevated troponin - No more chest pain, echo pending (called them, she needs this am). Plan is medical Rx, but echo will help guide treatment. She is on Plavix (no ASA, ?secondary to GIB Hx), Imdur, ARB, BB(low dose) - will increase BB and see how tolerated.   Otherwise, per IM Principal Problem:   Acute on chronic respiratory failure - mgt per IM, but will order Mucinex at pt request (prescription strength for now, home rx is OTC) Active Problems:   HYPERCHOLESTEROLEMIA - on Lipitor   HYPERTENSION, BENIGN ESSENTIAL - follow on increased BB   COPD   GI bleed- Feb 2014, June 2014   CAD- tandem Cyper DES to prox. and Mid RCA 2006, stable at cath 2008, low risk Myoview March 2014   Protein-calorie malnutrition, severe   Acute encephalopathy   Aspiration pneumonia   Signed, Rosaria Ferries , PA-C 9:12 AM 10/01/2013  Agree with note by Rosaria Ferries PA-C. Still awaiting 2D to assess LV fxn. If nl and no RWMA will s/o and be available.  Lorretta Harp, M.D., [place], [facility], Laverta Baltimore [place]lin [address] [place], [place]  [id number]  [phone] 10/01/2013 10:54 AM

## 2013-10-01 NOTE — Progress Notes (Addendum)
[facility] PROGRESS NOTE Interim History: 79 y.o. female with a past medical history significant for severe COPD, chronic pain on narcotics, CAD s/p RCA DES x 2 in '06, and GI bleeding Feb 2014 secondary to PUD. She is on Plavix, no ASA. She is cachectic - 90 lbs. She has continued to smoke. She recently saw her primary care provider and was put on antibiotics and steroids as an OP for a COPD exacerbation. She was seen in follow up and not improved and home O2 at 2 L was added. She presents to the ER now lethargic and confused per family.   Assessment/Plan: Aspiration pneumonia /COPD exacerbation: - Chest x-ray opacity there is a high clinical suspicion for aspiration pneumonitis. due to narcotics use. - will start tapering narcotics. I have d/w with PT and family, daughter will manage medications. - Blood cultures have been obtained  - Deescalate to Unasyn, d/c vanc.  Acute respiratory failure: - The patient continues to smoke one pack per day  - Aerosolized albuterol and Atrovent  - Pulmonary hygiene - Start IV steroids, no wheezing now change to orals, (pt does not want prednisone agreed with hydrocortisone)  Chronic pain/opioid dependence  -Minimize opioids  -cont to tapared off.  Acute encephalopathy  - Secondary to medications as well as infectious process and respiratory failure  - According to the patient's daughter, there is concern that the patient over uses her opioids  - The patient is more alert than when she was at home according to the patient's family   Elevated troponin  -This is likely demand ischemia although cannot rule out type II NSTEMI  -Cardiology consultation: echo pending no further cardiac w/u.  Hypertension  -Continue metoprolol tartrate  - resume home meds -Continue Plavix   Myoclonus  -Likely due to the patient's gabapentin which has been discontinued   Severe prot. Malnutrition:  Code Status: DNR Family Communication: none    Disposition Plan: inpatient   Consultants:  cardiology  Procedures:  Echo  cxr  Antibiotics:   Unasyn 4.6.2015  HPI/Subjective: HA's resolved   Objective: Filed Vitals:   10/01/13 0210 10/01/13 0600 10/01/13 0839 10/01/13 0906  BP: 153/84 169/89 162/94 147/91  Pulse: 71 68 71 82  Temp: 98.9 F (37.2 C) 99.4 F (37.4 C)    TempSrc: Oral Oral    Resp: 18 18    Height:      Weight:  40 kg (88 lb 2.9 oz)    SpO2: 95% 98% 93%     Intake/Output Summary (Last 24 hours) at 10/01/13 1009 Last data filed at 10/01/13 0848  Gross per 24 hour  Intake    600 ml  Output   1300 ml  Net   -700 ml   Filed Weights   09/29/13 1518 09/30/13 0500 10/01/13 0600  Weight: 40.1 kg (88 lb 6.5 oz) 39.5 kg (87 lb 1.3 oz) 40 kg (88 lb 2.9 oz)    Exam:  General: Alert, awake, oriented x3, in no acute distress. cachectic HEENT: No bruits, no goiter.  Heart: Regular rate and rhythm, without murmurs, rubs, gallops.  Lungs: Good air movement, crackles on right, wheezing resolved. Abdomen: Soft, nontender, nondistended, positive bowel sounds.     Data Reviewed: Basic Metabolic Panel:  Recent Labs Lab 09/29/13 1155 09/30/13 0136  NA 145 144  K 3.3* 3.7  CL 102 104  CO2 29 26  GLUCOSE 105* 115*  BUN 20 15  CREATININE 0.87 0.62  CALCIUM 9.1 8.8  Liver Function Tests: No results found for this basename: AST, ALT, ALKPHOS, BILITOT, PROT, ALBUMIN,  in the last 168 hours No results found for this basename: LIPASE, AMYLASE,  in the last 168 hours No results found for this basename: AMMONIA,  in the last 168 hours CBC:  Recent Labs Lab 09/29/13 1155 09/30/13 0136  WBC 9.9 9.9  NEUTROABS 7.2  --   HGB 16.1* 14.6  HCT 48.4* 44.5  MCV 96.8 95.3  PLT 201 189   Cardiac Enzymes:  Recent Labs Lab 09/29/13 1155 09/29/13 1844 09/30/13 0136  TROPONINI 0.54* 0.48* 0.51*   BNP (last 3 results)  Recent Labs  09/29/13 1155  PROBNP 582.1*   CBG: No results found for  this basename: GLUCAP,  in the last 168 hours  Recent Results (from the past 240 hour(s))  CULTURE, BLOOD (ROUTINE X 2)     Status: None   Collection Time    09/29/13  2:01 PM      Result Value Ref Range Status   Specimen Description BLOOD RIGHT ARM   Final   Special Requests BOTTLES DRAWN AEROBIC ONLY 5 CC   Final   Culture  Setup Time     Final   Value: 09/29/2013 20:08     Performed at [facility]   Culture     Final   Value:        BLOOD CULTURE RECEIVED NO GROWTH TO DATE CULTURE WILL BE HELD FOR 5 DAYS BEFORE ISSUING A FINAL NEGATIVE REPORT     Performed at [facility]   Report Status PENDING   Incomplete  CULTURE, BLOOD (ROUTINE X 2)     Status: None   Collection Time    09/29/13  2:01 PM      Result Value Ref Range Status   Specimen Description BLOOD RIGHT ARM   Final   Special Requests BOTTLES DRAWN AEROBIC AND ANAEROBIC 5CC   Final   Culture  Setup Time     Final   Value: 09/29/2013 20:08     Performed at [facility]   Culture     Final   Value:        BLOOD CULTURE RECEIVED NO GROWTH TO DATE CULTURE WILL BE HELD FOR 5 DAYS BEFORE ISSUING A FINAL NEGATIVE REPORT     Performed at [facility]   Report Status PENDING   Incomplete     Studies: Dg Chest Port 1 View  09/29/2013   CLINICAL DATA:  Altered mental status, hypoxia, oxygen-dependent  EXAM: PORTABLE CHEST - 1 VIEW  COMPARISON:  08/13/2013  FINDINGS: Background hyperinflation noted compatible with COPD/ emphysema. Heart is enlarged but normal vascularity. Negative for CHF. Exam is rotated to the right. Despite this, there is increased opacity in the right base noted concerning for airspace disease or pneumonia. Aspiration could have a similar appearance. Left lung remains clear. No effusion or pneumothorax. Atherosclerosis of the aorta noted. Coronary stents evident.  IMPRESSION: Right base patchy airspace process concerning for pneumonia  Background COPD/emphysema   Electronically  Signed   By: Daryll Brod M.D.   On: 09/29/2013 11:58    Scheduled Meds: . ampicillin-sulbactam (UNASYN) IV  1.5 g Intravenous Q6H  . atorvastatin  80 mg Oral QHS  . clopidogrel  75 mg Oral Daily  . donepezil  5 mg Oral QHS  . guaiFENesin  1,200 mg Oral BID  . heparin  5,000 Units Subcutaneous 3 times per day  . ipratropium-albuterol  3 mL Nebulization TID  . isosorbide mononitrate  60 mg Oral QPM  . losartan  50 mg Oral Daily  . metoCLOPramide  5 mg Oral BID  . metoprolol tartrate  12.5 mg Oral BID  . mometasone-formoterol  2 puff Inhalation BID  . nicotine  21 mg Transdermal Q24H  . OxyCODONE  10 mg Oral Q12H  . pantoprazole  40 mg Oral Daily  . topiramate  50 mg Oral Daily  . vancomycin  500 mg Intravenous Q24H   Continuous Infusions:     Charlynne Cousins  [facility] Pager [phone]. If 8PM-8AM, please contact night-coverage at [email or web address], password [facility] 10/01/2013, 10:09 AM  LOS: 2 days

## 2013-10-01 NOTE — Progress Notes (Signed)
  Echocardiogram 2D Echocardiogram has been performed.  Kathy Marshall 10/01/2013, 10:37 AM

## 2013-10-01 NOTE — Progress Notes (Signed)
PT Cancellation Note  Patient Details Name: BRYANNE RIQUELME MRN: [id number] DOB: [birth year]   Cancelled Treatment:    Reason Eval/Treat Not Completed: Fatigue/lethargy limiting ability to participate;Medical issues which prohibited therapy. Pt refused due to c/o nausea and fatigue.   Montel Vanderhoof 10/01/2013, 1:34 PM

## 2013-10-02 LAB — CLOSTRIDIUM DIFFICILE BY PCR: Toxigenic C. Difficile by PCR: NEGATIVE

## 2013-10-02 MED ORDER — METRONIDAZOLE 500 MG PO TABS
500.0000 mg | ORAL_TABLET | Freq: Three times a day (TID) | ORAL | Status: DC
  Filled 2013-10-02 (×3): qty 1

## 2013-10-02 MED ORDER — AMOXICILLIN-POT CLAVULANATE 875-125 MG PO TABS
1.0000 | ORAL_TABLET | Freq: Two times a day (BID) | ORAL | Status: DC
  Administered 2013-10-02 – 2013-10-03 (×2): 1 via ORAL
  Filled 2013-10-02 (×4): qty 1

## 2013-10-02 NOTE — Progress Notes (Signed)
[facility] PROGRESS NOTE Interim History: 79 y.o. female with a past medical history significant for severe COPD, chronic pain on narcotics, CAD s/p RCA DES x 2 in '06, and GI bleeding Feb 2014 secondary to PUD. She is on Plavix, no ASA. She is cachectic - 90 lbs. She has continued to smoke. She recently saw her primary care provider and was put on antibiotics and steroids as an OP for a COPD exacerbation. She was seen in follow up and not improved and home O2 at 2 L was added. She presents to the ER now lethargic and confused per family.   Assessment/Plan: Aspiration pneumonia /COPD exacerbation: - Chest x-ray opacity there is a high clinical suspicion for aspiration pneumonitis. due to narcotics use. - will start tapering narcotics. I have d/w with PT and family, daughter will manage medications. - Blood cultures have been obtained  - Deescalate to Augmentin, d/c vanc.  New watery diarrhea: - start flagyl check c.ddif.  Acute respiratory failure: - The patient continues to smoke one pack per day  - Aerosolized albuterol and Atrovent  - Pulmonary hygiene - Start IV steroids, no wheezing now change to orals, (pt does not want prednisone agreed with hydrocortisone)  Chronic pain/opioid dependence  -Minimize opioids  -cont to tapared off.  Acute encephalopathy  - Secondary to medications as well as infectious process and respiratory failure  - According to the patient's daughter, there is concern that the patient over uses her opioids  - The patient is more alert than when she was at home according to the patient's family   Elevated troponin  -This is likely demand ischemia although cannot rule out type II NSTEMI  -Cardiology consultation: no further cardiac w/u. - echo showed estimated ejection fraction was in the range of 65% to 70%. Wall motion was normal; there were no regional wall motion abnormalities. Doppler parameters are consistent with abnormal left ventricular  relaxation (grade 1 diastolic dysfunction. No wall motion abnormality.  Hypertension  -Continue metoprolol tartrate  - resume home meds -Continue Plavix   Myoclonus  -Likely due to the patient's gabapentin which has been discontinued   Severe prot. Malnutrition:  Code Status: DNR Family Communication: none  Disposition Plan: inpatient   Consultants:  cardiology  Procedures:  Echo  cxr  Antibiotics:   Unasyn 4.6.2015  HPI/Subjective: HA's resolved Complaing of diarrhea.   Objective: Filed Vitals:   10/01/13 2044 10/02/13 0541 10/02/13 0826 10/02/13 0939  BP: 189/78 153/91  161/90  Pulse: 71 78  72  Temp: 99.3 F (37.4 C) 99.3 F (37.4 C)    TempSrc: Oral Oral    Resp: 16 18  18   Height:      Weight:  39.282 kg (86 lb 9.6 oz)    SpO2: 96% 93% 93% 99%    Intake/Output Summary (Last 24 hours) at 10/02/13 1222 Last data filed at 10/02/13 1134  Gross per 24 hour  Intake    320 ml  Output    200 ml  Net    120 ml   Filed Weights   09/30/13 0500 10/01/13 0600 10/02/13 0541  Weight: 39.5 kg (87 lb 1.3 oz) 40 kg (88 lb 2.9 oz) 39.282 kg (86 lb 9.6 oz)    Exam:  General: Alert, awake, oriented x3, in no acute distress. cachectic HEENT: No bruits, no goiter.  Heart: Regular rate and rhythm, without murmurs, rubs, gallops.  Lungs: Good air movement, crackles on right, wheezing resolved. Abdomen: Soft, nontender, nondistended, positive  bowel sounds.     Data Reviewed: Basic Metabolic Panel:  Recent Labs Lab 09/29/13 1155 09/30/13 0136  NA 145 144  K 3.3* 3.7  CL 102 104  CO2 29 26  GLUCOSE 105* 115*  BUN 20 15  CREATININE 0.87 0.62  CALCIUM 9.1 8.8   Liver Function Tests: No results found for this basename: AST, ALT, ALKPHOS, BILITOT, PROT, ALBUMIN,  in the last 168 hours No results found for this basename: LIPASE, AMYLASE,  in the last 168 hours No results found for this basename: AMMONIA,  in the last 168 hours CBC:  Recent Labs Lab  09/29/13 1155 09/30/13 0136  WBC 9.9 9.9  NEUTROABS 7.2  --   HGB 16.1* 14.6  HCT 48.4* 44.5  MCV 96.8 95.3  PLT 201 189   Cardiac Enzymes:  Recent Labs Lab 09/29/13 1155 09/29/13 1844 09/30/13 0136  TROPONINI 0.54* 0.48* 0.51*   BNP (last 3 results)  Recent Labs  09/29/13 1155  PROBNP 582.1*   CBG: No results found for this basename: GLUCAP,  in the last 168 hours  Recent Results (from the past 240 hour(s))  CULTURE, BLOOD (ROUTINE X 2)     Status: None   Collection Time    09/29/13  2:01 PM      Result Value Ref Range Status   Specimen Description BLOOD RIGHT ARM   Final   Special Requests BOTTLES DRAWN AEROBIC ONLY 5 CC   Final   Culture  Setup Time     Final   Value: 09/29/2013 20:08     Performed at [facility]   Culture     Final   Value:        BLOOD CULTURE RECEIVED NO GROWTH TO DATE CULTURE WILL BE HELD FOR 5 DAYS BEFORE ISSUING A FINAL NEGATIVE REPORT     Performed at [facility]   Report Status PENDING   Incomplete  CULTURE, BLOOD (ROUTINE X 2)     Status: None   Collection Time    09/29/13  2:01 PM      Result Value Ref Range Status   Specimen Description BLOOD RIGHT ARM   Final   Special Requests BOTTLES DRAWN AEROBIC AND ANAEROBIC 5CC   Final   Culture  Setup Time     Final   Value: 09/29/2013 20:08     Performed at [facility]   Culture     Final   Value:        BLOOD CULTURE RECEIVED NO GROWTH TO DATE CULTURE WILL BE HELD FOR 5 DAYS BEFORE ISSUING A FINAL NEGATIVE REPORT     Performed at [facility]   Report Status PENDING   Incomplete     Studies: No results found.  Scheduled Meds: . amoxicillin-clavulanate  1 tablet Oral Q12H  . atorvastatin  80 mg Oral QHS  . clopidogrel  75 mg Oral Daily  . donepezil  5 mg Oral QHS  . guaiFENesin  1,200 mg Oral BID  . heparin  5,000 Units Subcutaneous 3 times per day  . hydrocortisone  20 mg Oral BID  . ipratropium-albuterol  3 mL Nebulization TID  .  isosorbide mononitrate  60 mg Oral QPM  . losartan  50 mg Oral Daily  . metoCLOPramide  5 mg Oral BID  . metoprolol tartrate  12.5 mg Oral BID  . mometasone-formoterol  2 puff Inhalation BID  . nicotine  21 mg Transdermal Q24H  . pantoprazole  40  mg Oral Daily  . topiramate  25 mg Oral Daily   Continuous Infusions:     Charlynne Cousins  [facility] Pager [phone]. If 8PM-8AM, please contact night-coverage at [email or web address], password [facility] 10/02/2013, 12:22 PM  LOS: 3 days

## 2013-10-02 NOTE — Progress Notes (Signed)
2D echo nl. No further cardiac w/u necessary at this time. Will see again PRN  Lorretta Harp, M.D., [place], [facility], [place] [address] [place], [place]  [id number]  [phone] 10/02/2013 12:02 PM

## 2013-10-02 NOTE — Progress Notes (Signed)
Patient evaluated for community based chronic disease management services with [facility] as a benefit of patient's [employer]. Spoke with patient at bedside to explain [facility] services.  She gave verbal consent for her daughter to sign her consent forms. Written consents obtained.  Daughter would like to better understand the role of a HCPOA as she is her mother's primary care giver.  Patient will receive a post discharge transition of care call and will be evaluated for monthly home visits for assessments and disease process education.  Left contact information and [facility] literature at bedside. Made Inpatient Case Manager aware that [facility] following. Of note, [facility] Care Management services does not replace or interfere with any services that are arranged by inpatient case management or social work.  For additional questions or referrals please contact Corliss Blacker BSN RN [facility] Liaison at [phone].

## 2013-10-02 NOTE — Evaluation (Signed)
Physical Therapy Evaluation Patient Details Name: GIFT RUECKERT MRN: [id number] DOB: [birth year] Today's Date: 10/02/2013   History of Present Illness  79 year old female with a history of hypertension, CAD with history of RCA stent, COPD with continued tobacco use, chronic back pain, and TIA was brought to the hospital when the patient's daughter had difficulty arousing her from sleep this morning.   Clinical Impression  Pt adm due to the above. Presents with decreased independence with mobility and decreased functional mobility secondary to deficits indicated below. Pt to benefit from skilled acute PT to address deficits and maximize functional mobility prior to D/C home with daughter. Pt recently placed on 2L O2 at home; RN present and pt on RA at this time; sats at 93% at rest and 94% with activity. Pt limited today due to nausea. Patient needs to practice stairs next session.       Follow Up Recommendations Home health PT;Supervision/Assistance - 24 hour    Equipment Recommendations  Rolling walker with 5" wheels;Other (comment) (may be able to use youth RW )    Recommendations for Other Services       Precautions / Restrictions Precautions Precautions: Fall Precaution Comments: daughter reports pt has "hit the walls" a lot at home  Restrictions Weight Bearing Restrictions: No      Mobility  Bed Mobility Overal bed mobility: Modified Independent                Transfers Overall transfer level: Needs assistance Equipment used: None Transfers: Sit to/from Stand Sit to Stand: Supervision         General transfer comment: supervision for safety; slight sway initially; c/o nausea   Ambulation/Gait Ambulation/Gait assistance: Min guard Ambulation Distance (Feet): 8 Feet Assistive device: 1 person hand held assist Gait Pattern/deviations: Step-through pattern;Decreased stride length;Narrow base of support Gait velocity: decreased Gait velocity  interpretation: Below normal speed for age/gender General Gait Details: min guard to steady pt; cues for safety; limited by fatigue and nausea; would benefit from RW upon acute D/C to increase stability with gt  Stairs            Wheelchair Mobility    Modified Rankin (Stroke Patients Only)       Balance Overall balance assessment: History of Falls;Needs assistance Sitting-balance support: Feet supported;No upper extremity supported Sitting balance-Leahy Scale: Good     Standing balance support: During functional activity;Single extremity supported Standing balance-Leahy Scale: Poor Standing balance comment: +sway; handheld (A)                             Pertinent Vitals/Pain See impression for O2 sats; no c/o pain.    Home Living Family/patient expects to be discharged to:: Private residence Living Arrangements: Children Available Help at Discharge: Family;Available 24 hours/day Type of Home: Mobile home Home Access: Stairs to enter Entrance Stairs-Rails: Right Entrance Stairs-Number of Steps: 4 Home Layout: One level Home Equipment: None      Prior Function Level of Independence: Independent         Comments: daughter cooks and [employer]   Dominant Hand: Right    Extremity/Trunk Assessment   Upper Extremity Assessment: Generalized weakness           Lower Extremity Assessment: Generalized weakness      Cervical / Trunk Assessment: Other exceptions  Communication   Communication: No difficulties  Cognition Arousal/Alertness: Awake/alert Behavior During Therapy: WFL for  tasks assessed/performed Overall Cognitive Status: Within Functional Limits for tasks assessed                      General Comments      Exercises General Exercises - Lower Extremity Ankle Circles/Pumps: AROM;Strengthening;Both;10 reps;Seated Long Arc Quad: AROM;Strengthening;Both;10 reps;Seated      Assessment/Plan    PT  Assessment Patient needs continued PT services  PT Diagnosis Abnormality of gait;Generalized weakness   PT Problem List Decreased strength;Decreased activity tolerance;Decreased balance;Decreased mobility;Decreased safety awareness;Decreased knowledge of use of DME  PT Treatment Interventions DME instruction;Gait training;Stair training;Functional mobility training;Therapeutic activities;Therapeutic exercise;Balance training;Neuromuscular re-education;Patient/family education   PT Goals (Current goals can be found in the Care Plan section) Acute Rehab PT Goals Patient Stated Goal: to go home thursday or friday PT Goal Formulation: With patient Time For Goal Achievement: 10/16/13 Potential to Achieve Goals: Good    Frequency Min 3X/week   Barriers to discharge        Co-evaluation               End of Session Equipment Utilized During Treatment: Gait belt;Oxygen Activity Tolerance: Patient limited by fatigue;Other (comment) (c/o nausea ) Patient left: in chair;with call bell/phone within reach;Other (comment) (RT in room ) Nurse Communication: Mobility status;Other (comment) (O2 sats)         Time: 5427-0623 PT Time Calculation (min): 21 min   Charges:   PT Evaluation $Initial PT Evaluation Tier I: 1 Procedure PT Treatments $Gait Training: 8-22 mins   PT G CodesKennis Carina [place], [place] [phone] 10/02/2013, 8:43 AM

## 2013-10-03 MED ORDER — AMOXICILLIN-POT CLAVULANATE 875-125 MG PO TABS: 1.0000 | ORAL_TABLET | Freq: Two times a day (BID) | ORAL | Status: DC

## 2013-10-03 MED ORDER — DIPHENOXYLATE-ATROPINE 2.5-0.025 MG/5ML PO LIQD: 10.0000 mL | Freq: Once | ORAL | Status: DC

## 2013-10-03 MED ORDER — DIPHENOXYLATE-ATROPINE 2.5-0.025 MG PO TABS: 1.0000 | ORAL_TABLET | Freq: Four times a day (QID) | ORAL | Status: DC | PRN

## 2013-10-03 NOTE — Progress Notes (Signed)
1500 discharge instruction and prescription given to pt and daughter . Verbalized understanding

## 2013-10-03 NOTE — Progress Notes (Signed)
Physical Therapy Treatment Patient Details Name: Kathy Marshall MRN: [id number] DOB: 1934-10-14 Today's Date: 10/03/2013    History of Present Illness 79 year old female with a history of hypertension, CAD with history of RCA stent, COPD with continued tobacco use, chronic back pain, and TIA was brought to the hospital when the patient's daughter had difficulty arousing her from sleep this morning.     PT Comments    Pt slowly progressing mobility. Continues to be limited by decreased activity tolerance. Pt transferred to [facility] and required (A) with hygiene. Pt fatigued after standing for hygiene and required sitting rest break prior to ambulation. Discussed DME needs for D/C. Pt will benefit from AD for ambulation; unsure if youth RW will fit throughout house; HHPT to make DME recommendations upon home evaluation.   Follow Up Recommendations  Home health PT;Supervision/Assistance - 24 hour     Equipment Recommendations  Rolling walker with 5" wheels;Other (comment) (RW vs cane pending HH eval for home setup)    Recommendations for Other Services       Precautions / Restrictions Precautions Precautions: Fall Precaution Comments: daughter reports pt has "hit the walls" a lot at home  Restrictions Weight Bearing Restrictions: No    Mobility  Bed Mobility Overal bed mobility: Modified Independent             General bed mobility comments: incr time; no physical (A) required  Transfers Overall transfer level: Needs assistance Equipment used: Rolling walker (2 wheeled) Transfers: Sit to/from [facility] Sit to Stand: Supervision Stand pivot transfers: Supervision       General transfer comment: cues for hand placement and safety with RW; initially stood then required immediate pivot transfer to [facility] for BM; cues for sequencing and supervision for safety for transfer  Ambulation/Gait Ambulation/Gait assistance: Min guard Ambulation Distance (Feet): 20  Feet Assistive device: Rolling walker (2 wheeled) Gait Pattern/deviations: Step-through pattern;Shuffle;Decreased stride length;Narrow base of support Gait velocity: decreased Gait velocity interpretation: Below normal speed for age/gender General Gait Details: min guard to steady and cues for managing RW; pt fatigues quickly; one standing rest break during ambulation; will benefit from AD to stabilize during gt; unsure if youth RW or cane will be more appropriate for trailer    Stairs Stairs:  (daughter reported "ill have no problem getting her up stairs)          Wheelchair Mobility    Modified Rankin (Stroke Patients Only)       Balance Overall balance assessment: Needs assistance;History of Falls Sitting-balance support: Feet supported;No upper extremity supported Sitting balance-Leahy Scale: Good     Standing balance support: During functional activity;Bilateral upper extremity supported Standing balance-Leahy Scale: Poor Standing balance comment: unsteady; pt requires bil UE support and (A) for perineal hygiene at [place]: Awake/alert Behavior During Therapy: WFL for tasks assessed/performed Overall Cognitive Status: Within Functional Limits for tasks assessed                      Exercises      General Comments General comments (skin integrity, edema, etc.): educated on HEP for LE strengthening; encouraged to perform AP's; LAQ; hip abduction; heel slides 3x daily 10 reps; pt verbalized understanding       Pertinent Vitals/Pain 7/10 in back; pt premedicated by [place]  Prior Function            PT Goals (current goals can now be found in the care plan section) Acute Rehab PT Goals Patient Stated Goal: home today PT Goal Formulation: With patient Time For Goal Achievement: 10/16/13 Potential to Achieve Goals: Good Progress towards PT goals: Progressing  toward goals    Frequency  Min 3X/week    PT Plan Current plan remains appropriate    Co-evaluation             End of Session Equipment Utilized During Treatment: Gait belt;Oxygen Activity Tolerance: Patient limited by fatigue Patient left: in bed;with call bell/phone within reach;with family/visitor present     Time: 0350-0938 PT Time Calculation (min): 24 min  Charges:  $Gait Training: 8-22 mins $Therapeutic Activity: 8-22 mins                    G CodesKennis Carina Arab, [place] [phone] 10/03/2013, 9:26 AM

## 2013-10-03 NOTE — Care Management Note (Signed)
    Page 1 of 2   10/03/2013     3:32:00 PM   CARE MANAGEMENT NOTE 10/03/2013  Patient:  Kathy Marshall, Kathy Marshall   Account Number:  [id number]  Date Initiated:  10/03/2013  Documentation initiated by:  [facility]  Subjective/Objective Assessment:   79 year old female with PMHx of HTN, CAD with history of RCA stent, COPD with continued tobacco use, chronic back pain, and TIA was brought to the hospital when the daughter had difficulty arousing her from sleep//Home with daughter     Action/Plan:   IV abx//Access for Home Health needs   Anticipated DC Date:  10/03/2013   Anticipated DC Plan:  [place]  CM consult      [facility] Choice  HOME HEALTH  Resumption Of Svcs/PTA Provider   Choice offered to / List presented to:  C-4 Adult Children        [place] arranged  HH-2 PT      [place]   Status of service:  Completed, signed off Medicare Important Message given?   (If response is "NO", the following Medicare IM given date fields will be blank) Date Medicare IM given:   Date Additional Medicare IM given:    Discharge Disposition:  [place]  Per UR Regulation:  Reviewed for med. necessity/level of care/duration of stay  If discussed at [place] of Stay Meetings, dates discussed:    Comments:  10/03/13 [place], RN, BSN, [place] [phone] Pt currently active with [place] for PT services.  Resumption of care requested.  [facility]hett Bannister [facility] [place] notified.  No DME needs identified at this time.

## 2013-10-03 NOTE — Discharge Summary (Signed)
Physician Discharge Summary  Kathy Marshall [id number] DOB: [birth year] DOA: 09/29/2013  PCP: Odette Fraction, MD  Admit date: 09/29/2013 Discharge date: 10/03/2013  Time spent: 40 minutes  Recommendations for Outpatient Follow-up:  1. Follow up with PCP in 2- weeks.  Discharge Diagnoses:  Principal Problem:   Acute on chronic respiratory failure Active Problems:   HYPERCHOLESTEROLEMIA   HYPERTENSION, BENIGN ESSENTIAL   COPD   GI bleed- Feb 2014, June 2014   CAD- tandem Cyper DES to prox. and Mid RCA 2006, stable at cath 2008, low risk Myoview March 2014   Protein-calorie malnutrition, severe   Acute encephalopathy   Aspiration pneumonia   Elevated troponin   Discharge Condition: stable  Diet recommendation: regaular  Filed Weights   10/01/13 0600 10/02/13 0541 10/03/13 0507  Weight: 40 kg (88 lb 2.9 oz) 39.282 kg (86 lb 9.6 oz) 38.465 kg (84 lb 12.8 oz)    History of present illness:  79 year old female with a history of hypertension, CAD with history of RCA stent, COPD with continued tobacco use, chronic back pain, and TIA was brought to the hospital when the patient's daughter had difficulty arousing her from sleep this morning. The patient saw her primary care physician on 09/16/2013. The patient was placed on levofloxacin and prednisone 40 mg daily for COPD exacerbation. The patient did not experience any improvement. She went back to see her primary care physician on 09/26/2013. The patient was given prednisone 60 mg daily with moxifloxacin. During her 09/26/2013 visit, the patient was noted to be hypoxemic in the office. The patient was arranged to have 2 L nasal cannula. The patient continued to have worsening cough and shortness of breath. In addition, the patient was noted to be more somnolent on the day prior to admission. EMS was activated and the patient was lethargic this morning. The patient is unable to provide any history at this time due to her  encephalopathy. All this history is obtained from speaking with the patient's daughter at the bedside. There's been chronic history of vomiting from esophagitis. The patient has numerous episodes of vomitus on weekly basis, but the daughter states that she has not vomited in the past 3 days. There is no history of hematemesis or diarrhea. There's been some subjective fevers and chills. No complaints of headaches, hematochezia, dysuria, melena. The patient continues to smoke one pack per day.   Hospital Course:  Aspiration pneumonia /COPD exacerbation:  - Chest x-ray opacity there is a high clinical suspicion for aspiration pneumonitis. due to narcotics use.  - tapered narcotics. I have d/w with PT and family, daughter will manage medications.  - Blood cultures negative till date. - Deescalate to Augmentin fro additional days as an outpatient., d/c vanc.   New watery diarrhea:  - c.ddif.  Negative. - start lomotil.  Acute respiratory failure:  - The patient continues to smoke one pack per day. - Aerosolized albuterol and Atrovent  - Pulmonary hygiene  - Start IV steroids, no wheezing now change to orals.  Chronic pain/opioid dependence  -Minimize opioids  -cont to tapared off.   Acute encephalopathy  - Secondary to medications as well as infectious process and respiratory failure  - According to the patient's daughter, there is concern that the patient over uses her opioids  - The patient is more alert than when she was at home according to the patient's family   Elevated troponin  -This is likely demand ischemia although cannot rule out type II NSTEMI  -Cardiology  consultation: no further cardiac w/u.  - echo showed estimated ejection fraction was in the range of 65% to 70%. Wall motion was normal; there were no regional wall motion abnormalities. Doppler parameters are consistent with abnormal left ventricular relaxation (grade 1 diastolic dysfunction. No wall motion abnormality.    Hypertension  -Continue metoprolol tartrate  - resume home meds  -Continue Plavix      Procedures:  ECho  CXR  Consultations:  cardiology  Discharge Exam: Filed Vitals:   10/03/13 0507  BP: 170/90  Pulse: 60  Temp: 98.2 F (36.8 C)  Resp: 16    General: A&O x3 Cardiovascular: RRR Respiratory: good air movement CTA B/L  Discharge Instructions You were cared for by a hospitalist during your hospital stay. If you have any questions about your discharge medications or the care you received while you were in the hospital after you are discharged, you can call the unit and asked to speak with the hospitalist on call if the hospitalist that took care of you is not available. Once you are discharged, your primary care physician will handle any further medical issues. Please note that NO REFILLS for any discharge medications will be authorized once you are discharged, as it is imperative that you return to your primary care physician (or establish a relationship with a primary care physician if you do not have one) for your aftercare needs so that they can reassess your need for medications and monitor your lab values.      Discharge Orders   Future Appointments Provider Department Dept Phone   10/15/2013 2:15 PM Lafayette Dragon, MD [facility] [phone]   11/11/2013 11:00 AM Troy Sine, MD [facility] [phone]   Future Orders Complete By Expires   Diet - low sodium heart healthy  As directed    Increase activity slowly  As directed        Medication List    STOP taking these medications       morphine 30 MG 12 hr tablet  Commonly known as:  MS CONTIN     moxifloxacin 400 MG tablet  Commonly known as:  AVELOX      TAKE these medications       albuterol 108 (90 BASE) MCG/ACT inhaler  Commonly known as:  PROAIR HFA  Inhale 2 puffs into the lungs every 4 (four) hours as needed for wheezing.     ALPRAZolam 0.5 MG  tablet  Commonly known as:  XANAX  Take 0.5 mg by mouth at bedtime.     amoxicillin-clavulanate 875-125 MG per tablet  Commonly known as:  AUGMENTIN  Take 1 tablet by mouth every 12 (twelve) hours.     atorvastatin 80 MG tablet  Commonly known as:  LIPITOR  Take 80 mg by mouth at bedtime.     clopidogrel 75 MG tablet  Commonly known as:  PLAVIX  Take 1 tablet (75 mg total) by mouth daily.     COMPLETE PROTEIN/VITAMIN SHAKE PO  Take 2-3 Bottles by mouth daily as needed (nutritional supplment).     diphenoxylate-atropine 2.5-0.025 MG per tablet  Commonly known as:  LOMOTIL  Take 1 tablet by mouth 4 (four) times daily as needed for diarrhea or loose stools.     donepezil 5 MG tablet  Commonly known as:  ARICEPT  Take 1 tablet (5 mg total) by mouth at bedtime.     fluticasone 50 MCG/ACT nasal spray  Commonly known as:  Casey  2 sprays into both nostrils 2 (two) times daily as needed for allergies or rhinitis.     fluticasone-salmeterol 45-21 MCG/ACT inhaler  Commonly known as:  ADVAIR HFA  Inhale 2 puffs into the lungs 2 (two) times daily as needed (wheezing and shortness of breath).     gabapentin 300 MG capsule  Commonly known as:  NEURONTIN  Take 300 mg by mouth at bedtime.     ibuprofen 200 MG tablet  Commonly known as:  ADVIL,MOTRIN  Take 400-600 mg by mouth every 6 (six) hours as needed for moderate pain.     isosorbide mononitrate 60 MG 24 hr tablet  Commonly known as:  IMDUR  Take 60 mg by mouth every evening.     losartan 50 MG tablet  Commonly known as:  COZAAR  Take 50 mg by mouth daily.     metoCLOPramide 5 MG tablet  Commonly known as:  REGLAN  Take 5 mg by mouth 2 (two) times daily.     metoprolol tartrate 25 MG tablet  Commonly known as:  LOPRESSOR  Take 0.5 tablets (12.5 mg total) by mouth 2 (two) times daily.     ondansetron 8 MG tablet  Commonly known as:  ZOFRAN  Take 8 mg by mouth every 8 (eight) hours as needed for nausea or  vomiting.     oxyCODONE-acetaminophen 10-325 MG per tablet  Commonly known as:  PERCOCET  Take 1 tablet by mouth 5 (five) times daily as needed for pain.     pantoprazole 40 MG tablet  Commonly known as:  PROTONIX  Take 40 mg by mouth daily.     predniSONE 20 MG tablet  Commonly known as:  DELTASONE  Take 3 tablets (60 mg total) by mouth daily with breakfast.     topiramate 25 MG tablet  Commonly known as:  TOPAMAX  Take 50 mg by mouth daily.       No Known Allergies Follow-up Information   Follow up with Odette Fraction, MD.   Specialty:  Family Medicine   Contact information:   [address] [place]  [address] [phone]        The results of significant diagnostics from this hospitalization (including imaging, microbiology, ancillary and laboratory) are listed below for reference.    Significant Diagnostic Studies: Dg Chest Port 1 View  09/29/2013   CLINICAL DATA:  Altered mental status, hypoxia, oxygen-dependent  EXAM: PORTABLE CHEST - 1 VIEW  COMPARISON:  08/13/2013  FINDINGS: Background hyperinflation noted compatible with COPD/ emphysema. Heart is enlarged but normal vascularity. Negative for CHF. Exam is rotated to the right. Despite this, there is increased opacity in the right base noted concerning for airspace disease or pneumonia. Aspiration could have a similar appearance. Left lung remains clear. No effusion or pneumothorax. Atherosclerosis of the aorta noted. Coronary stents evident.  IMPRESSION: Right base patchy airspace process concerning for pneumonia  Background COPD/emphysema   Electronically Signed   By: Daryll Brod M.D.   On: 09/29/2013 11:58    Microbiology: Recent Results (from the past 240 hour(s))  CULTURE, BLOOD (ROUTINE X 2)     Status: None   Collection Time    09/29/13  2:01 PM      Result Value Ref Range Status   Specimen Description BLOOD RIGHT ARM   Final   Special Requests BOTTLES DRAWN AEROBIC ONLY 5 CC   Final    Culture  Setup Time     Final   Value:  09/29/2013 20:08     Performed at [facility]   Culture     Final   Value:        BLOOD CULTURE RECEIVED NO GROWTH TO DATE CULTURE WILL BE HELD FOR 5 DAYS BEFORE ISSUING A FINAL NEGATIVE REPORT     Performed at [facility]   Report Status PENDING   Incomplete  CULTURE, BLOOD (ROUTINE X 2)     Status: None   Collection Time    09/29/13  2:01 PM      Result Value Ref Range Status   Specimen Description BLOOD RIGHT ARM   Final   Special Requests BOTTLES DRAWN AEROBIC AND ANAEROBIC 5CC   Final   Culture  Setup Time     Final   Value: 09/29/2013 20:08     Performed at [facility]   Culture     Final   Value:        BLOOD CULTURE RECEIVED NO GROWTH TO DATE CULTURE WILL BE HELD FOR 5 DAYS BEFORE ISSUING A FINAL NEGATIVE REPORT     Performed at [facility]   Report Status PENDING   Incomplete  CLOSTRIDIUM DIFFICILE BY PCR     Status: None   Collection Time    10/02/13  1:34 PM      Result Value Ref Range Status   C difficile by pcr NEGATIVE  NEGATIVE Final     Labs: Basic Metabolic Panel:  Recent Labs Lab 09/29/13 1155 09/30/13 0136  NA 145 144  K 3.3* 3.7  CL 102 104  CO2 29 26  GLUCOSE 105* 115*  BUN 20 15  CREATININE 0.87 0.62  CALCIUM 9.1 8.8   Liver Function Tests: No results found for this basename: AST, ALT, ALKPHOS, BILITOT, PROT, ALBUMIN,  in the last 168 hours No results found for this basename: LIPASE, AMYLASE,  in the last 168 hours No results found for this basename: AMMONIA,  in the last 168 hours CBC:  Recent Labs Lab 09/29/13 1155 09/30/13 0136  WBC 9.9 9.9  NEUTROABS 7.2  --   HGB 16.1* 14.6  HCT 48.4* 44.5  MCV 96.8 95.3  PLT 201 189   Cardiac Enzymes:  Recent Labs Lab 09/29/13 1155 09/29/13 1844 09/30/13 0136  TROPONINI 0.54* 0.48* 0.51*   BNP: BNP (last 3 results)  Recent Labs  09/29/13 1155  PROBNP 582.1*   CBG: No results found for this basename:  GLUCAP,  in the last 168 hours     Signed:  Charlynne Cousins  [facility] 10/03/2013, 10:22 AM

## 2013-10-04 ENCOUNTER — Telehealth: Payer: Self-pay

## 2013-10-04 MED ORDER — ATORVASTATIN CALCIUM 80 MG PO TABS: 80.0000 mg | ORAL_TABLET | Freq: Every day | ORAL | Status: DC

## 2013-10-04 NOTE — Telephone Encounter (Signed)
Rx was sent to pharmacy electronically. 

## 2013-10-05 LAB — CULTURE, BLOOD (ROUTINE X 2)
CULTURE: NO GROWTH
Culture: NO GROWTH

## 2013-10-07 ENCOUNTER — Other Ambulatory Visit: Payer: Self-pay | Admitting: Family Medicine

## 2013-10-07 ENCOUNTER — Other Ambulatory Visit: Payer: Self-pay | Admitting: Physician Assistant

## 2013-10-07 NOTE — Telephone Encounter (Signed)
Refill denied.   Needs to contact office first.

## 2013-10-09 ENCOUNTER — Other Ambulatory Visit: Payer: Self-pay | Admitting: *Deleted

## 2013-10-10 ENCOUNTER — Other Ambulatory Visit: Payer: Self-pay | Admitting: *Deleted

## 2013-10-10 ENCOUNTER — Other Ambulatory Visit: Payer: Self-pay | Admitting: Physician Assistant

## 2013-10-10 ENCOUNTER — Telehealth: Payer: Self-pay | Admitting: *Deleted

## 2013-10-10 ENCOUNTER — Other Ambulatory Visit: Payer: Self-pay | Admitting: Internal Medicine

## 2013-10-10 NOTE — Telephone Encounter (Signed)
Hold losartan and encourage fluids.

## 2013-10-10 NOTE — Telephone Encounter (Signed)
Daughter Vaughan Basta is aware to stop Losartan

## 2013-10-10 NOTE — Telephone Encounter (Signed)
HHN from [employer] called stating that pts BP has dropped really low yesterday and this am, left arm sitting 120-/60 this am, right arm sitting 122/68, standing left arm this am.102/60, reading from therapist yesterday was 91/61 shortly after her exercise. Feeling nauseous on her stomach since yesterday. Nurse is wanting to know if you are wanting to change her BP medications. Please call.

## 2013-10-11 ENCOUNTER — Other Ambulatory Visit: Payer: Self-pay | Admitting: Internal Medicine

## 2013-10-11 ENCOUNTER — Other Ambulatory Visit: Payer: Self-pay | Admitting: Family Medicine

## 2013-10-12 ENCOUNTER — Other Ambulatory Visit: Payer: Self-pay | Admitting: Internal Medicine

## 2013-10-15 ENCOUNTER — Encounter: Payer: Self-pay | Admitting: Internal Medicine

## 2013-10-15 ENCOUNTER — Ambulatory Visit (INDEPENDENT_AMBULATORY_CARE_PROVIDER_SITE_OTHER): Payer: Commercial Managed Care - HMO | Admitting: Internal Medicine

## 2013-10-15 VITALS — BP 116/80 | HR 64 | Ht 61.0 in | Wt 91.4 lb

## 2013-10-15 DIAGNOSIS — R112 Nausea with vomiting, unspecified: Secondary | ICD-10-CM

## 2013-10-15 DIAGNOSIS — R627 Adult failure to thrive: Secondary | ICD-10-CM

## 2013-10-15 DIAGNOSIS — K922 Gastrointestinal hemorrhage, unspecified: Secondary | ICD-10-CM

## 2013-10-15 DIAGNOSIS — R634 Abnormal weight loss: Secondary | ICD-10-CM

## 2013-10-15 MED ORDER — PROMETHAZINE HCL 25 MG PO TABS: 25.0000 mg | ORAL_TABLET | Freq: Every day | ORAL | Status: DC

## 2013-10-15 NOTE — Patient Instructions (Addendum)
You have been given a separate informational sheet regarding your tobacco use, the importance of quitting and local resources to help you quit.  You have been scheduled for an endoscopy with propofol. Please follow written instructions given to you at your visit today. If you use inhalers (even only as needed), please bring them with you on the day of your procedure. Your physician has requested that you go to www.startemmi.com and enter the access code given to you at your visit today. This web site gives a general overview about your procedure. However, you should still follow specific instructions given to you by our office regarding your preparation for the procedure.  We have sent the following medications to your pharmacy for you to pick up at your convenience: Phenergan 25 mg every night.  Start taking your pantoprazole at night instead of during the morning.  CC: Dr Jenna Luo

## 2013-10-15 NOTE — Progress Notes (Signed)
Kathy Marshall 1934/07/02 [id number]  Note: This dictation was prepared with Dragon digital system. Any transcriptional errors that result from this procedure are unintentional.   History of Present Illness:  This is a 79 year old white female with nausea and vomiting. She was hospitalized recently for aspiration pneumonia. She has delayed gastric emptying due to narcotic use.. She has a history of acute UGI bleed from a gastric ulcer in February 2014  . A repeat endoscopy in June 2014 showed complete healing of the ulcer. She has been on Plavix. Her most recent hemoglobin was 14.6. She describes nausea which wakes her up in the morning before she even takes her medications. Her appetite has been decreased. She stopped taking ibuprofen and is currently only on Tylenol. She has taken Reglan 5 mg twice a day for gastroparesis.    Past Medical History  Diagnosis Date  . Low back pain   . Bronchitis   . Chronic hoarseness   . Sleep apnea, obstructive   . Renal artery stenosis   . PVD (peripheral vascular disease)   . Hypercholesteremia   . Pain in limb   . COPD (chronic obstructive pulmonary disease)   . Benign essential hypertension   . Atherosclerosis     coronary vessel type  . Coronary artery disease   . Diverticulosis   . History of GI diverticular bleed   . History of esophagitis   . H. pylori infection   . Altered mental status   . Pneumonia   . TIA (transient ischemic attack) 07/2010  . Arthritis   . GERD (gastroesophageal reflux disease)   . History of blood clots   . Tubular adenoma 2012  . Heart murmur   . CHF (congestive heart failure)   . Anginal pain   . Myocardial infarct 1990's    "I've only had one" (12/06/2012)  . Exertional shortness of breath   . Daily headache   . Anxiety   . Gastric ulcer 07/2012    large; required clipping Archie Endo 08/23/2012 (12/06/2012)    Past Surgical History  Procedure Laterality Date  . Oophorectomy    . Coronary angioplasty  with stent placement  2006    Cypher stent to RCA. On cath in 2008 stent described as widely patent.   . Esophagogastroduodenoscopy N/A 08/21/2012    Procedure: ESOPHAGOGASTRODUODENOSCOPY (EGD);  Surgeon: Lafayette Dragon, MD;  [facility] ENDOSCOPY;  Service: Endoscopy;  Laterality: N/A;  . Vaginal hysterectomy    . Esophagogastroduodenoscopy N/A 12/07/2012    Procedure: ESOPHAGOGASTRODUODENOSCOPY (EGD);  Surgeon: Gatha Mayer, MD;  Location: [facility] ENDOSCOPY;  Service: Endoscopy;  Laterality: N/A;    No Known Allergies  Family history and social history have been reviewed.  Review of Systems: Nausea weakness decreased appetite  The remainder of the 10 point ROS is negative except as outlined in the H&P  Physical Exam: General Appearance chronically ill-appearing in no distress Eyes  Non icteric  HEENT  Non traumatic, normocephalic  Mouth No lesion, tongue papillated, no cheilosis Neck Supple without adenopathy, thyroid not enlarged, no carotid bruits, no JVD Lungs Clear to auscultation bilaterally COR Normal S1, normal S2, regular rhythm, no murmur, quiet precordium Abdomen scaphoid soft with tenderness in epigastrium. No distention. No palpable mass Rectal not done Extremities  trace pedal edema left leg. No swelling right leg Skin No lesions Neurological Alert and oriented x 3 Psychological Normal mood and affect  Assessment and Plan:   Problem #1 History of gastric ulcer now with failure to  thrive. She has nausea and weight loss. She also has cardiovascular disease with angina, status post MI in June 2014. We need to rule out recurrent gastric ulcer. We also need to rule out gastric outlet obstruction. History of H. pylori gastritis.  We will proceed with an upper endoscopy to assess her gastric outlet. We will switch her pantoprazole to bedtime and will give her Phenergan 25 mg at bedtime. Depending on the findings, we may have to add Carafate or treat her for H. pylori pending  results of gastric biopsies. Problem #2 COPD Problem#3 s/p aspiration pneumonia, resolved   Lafayette Dragon 10/15/2013

## 2013-10-16 ENCOUNTER — Encounter: Payer: Commercial Managed Care - HMO | Admitting: Internal Medicine

## 2013-10-17 ENCOUNTER — Ambulatory Visit (INDEPENDENT_AMBULATORY_CARE_PROVIDER_SITE_OTHER): Payer: 59 | Admitting: Family Medicine

## 2013-10-17 ENCOUNTER — Encounter: Payer: Self-pay | Admitting: Family Medicine

## 2013-10-17 VITALS — BP 160/90 | HR 76 | Temp 97.1°F | Resp 20 | Ht 62.0 in | Wt 90.0 lb

## 2013-10-17 DIAGNOSIS — Z09 Encounter for follow-up examination after completed treatment for conditions other than malignant neoplasm: Secondary | ICD-10-CM

## 2013-10-17 DIAGNOSIS — R197 Diarrhea, unspecified: Secondary | ICD-10-CM

## 2013-10-17 NOTE — Progress Notes (Signed)
Subjective:    Patient ID: Kathy Marshall, female    DOB: 1935/01/13, 79 y.o.   MRN: [id number]  HPI Patient was recently admitted to the hospital.  I have copied the discharge summary below for reference:  Admit date: 09/29/2013  Discharge date: 10/03/2013  Time spent: 40 minutes  Recommendations for Outpatient Follow-up:  1. Follow up with PCP in 2- weeks. Discharge Diagnoses:  Principal Problem:  Acute on chronic respiratory failure  Active Problems:  HYPERCHOLESTEROLEMIA  HYPERTENSION, BENIGN ESSENTIAL  COPD  GI bleed- Feb 2014, June 2014  CAD- tandem Cyper DES to prox. and Mid RCA 2006, stable at cath 2008, low risk Myoview March 2014  Protein-calorie malnutrition, severe  Acute encephalopathy  Aspiration pneumonia  Elevated troponin  Discharge Condition: stable  Diet recommendation: regaular  Filed Weights    10/01/13 0600  10/02/13 0541  10/03/13 0507   Weight:  40 kg (88 lb 2.9 oz)  39.282 kg (86 lb 9.6 oz)  38.465 kg (84 lb 12.8 oz)   History of present illness:  79 year old female with a history of hypertension, CAD with history of RCA stent, COPD with continued tobacco use, chronic back pain, and TIA was brought to the hospital when the patient's daughter had difficulty arousing her from sleep this morning. The patient saw her primary care physician on 09/16/2013. The patient was placed on levofloxacin and prednisone 40 mg daily for COPD exacerbation. The patient did not experience any improvement. She went back to see her primary care physician on 09/26/2013. The patient was given prednisone 60 mg daily with moxifloxacin. During her 09/26/2013 visit, the patient was noted to be hypoxemic in the office. The patient was arranged to have 2 L nasal cannula. The patient continued to have worsening cough and shortness of breath. In addition, the patient was noted to be more somnolent on the day prior to admission. EMS was activated and the patient was lethargic this morning.  The patient is unable to provide any history at this time due to her encephalopathy. All this history is obtained from speaking with the patient's daughter at the bedside. There's been chronic history of vomiting from esophagitis. The patient has numerous episodes of vomitus on weekly basis, but the daughter states that she has not vomited in the past 3 days. There is no history of hematemesis or diarrhea. There's been some subjective fevers and chills. No complaints of headaches, hematochezia, dysuria, melena. The patient continues to smoke one pack per day.  Hospital Course:  Aspiration pneumonia /COPD exacerbation:  - Chest x-ray opacity there is a high clinical suspicion for aspiration pneumonitis. due to narcotics use.  - tapered narcotics. I have d/w with PT and family, daughter will manage medications.  - Blood cultures negative till date.  - Deescalate to Augmentin fro additional days as an outpatient., d/c vanc.  New watery diarrhea:  - c.ddif. Negative.  - start lomotil.  Acute respiratory failure:  - The patient continues to smoke one pack per day.  - Aerosolized albuterol and Atrovent  - Pulmonary hygiene  - Start IV steroids, no wheezing now change to orals.  Chronic pain/opioid dependence  -Minimize opioids  -cont to tapared off.  Acute encephalopathy  - Secondary to medications as well as infectious process and respiratory failure  - According to the patient's daughter, there is concern that the patient over uses her opioids  - The patient is more alert than when she was at home according to the patient's family  Elevated troponin  -This is likely demand ischemia although cannot rule out type II NSTEMI  -Cardiology consultation: no further cardiac w/u.  - echo showed estimated ejection fraction was in the range of 65% to 70%. Wall motion was normal; there were no regional wall motion abnormalities. Doppler parameters are consistent with abnormal left ventricular relaxation  (grade 1 diastolic dysfunction. No wall motion abnormality.  Hypertension  -Continue metoprolol tartrate  - resume home meds  -Continue Plavix    Procedures:  ECho  CXR Consultations:  cardiology Discharge Exam:  Filed Vitals:    10/03/13 0507   BP:  170/90   Pulse:  60   Temp:  98.2 F (36.8 C)   Resp:  16   General: A&O x3  Cardiovascular: RRR  Respiratory: good air movement CTA B/L  Discharge Instructions  You were cared for by a hospitalist during your hospital stay. If you have any questions about your discharge medications or the care you received while you were in the hospital after you are discharged, you can call the unit and asked to speak with the hospitalist on call if the hospitalist that took care of you is not available. Once you are discharged, your primary care physician will handle any further medical issues. Please note that NO REFILLS for any discharge medications will be authorized once you are discharged, as it is imperative that you return to your primary care physician (or establish a relationship with a primary care physician if you do not have one) for your aftercare needs so that they can reassess your need for medications and monitor your lab values.      Discharge Orders    Future Appointments  Provider  Department  Dept Phone    10/15/2013 2:15 PM  Lafayette Dragon, MD  [facility]  [phone]    11/11/2013 11:00 AM  Troy Sine, MD  [facility]  [phone]    Future Orders  Complete By  Expires    Diet - low sodium heart healthy  As directed     Increase activity slowly  As directed         Medication List     STOP taking these medications       morphine 30 MG 12 hr tablet    Commonly known as: MS CONTIN    moxifloxacin 400 MG tablet    Commonly known as: AVELOX     TAKE these medications       albuterol 108 (90 BASE) MCG/ACT inhaler    Commonly known as: PROAIR HFA    Inhale 2 puffs into the lungs  every 4 (four) hours as needed for wheezing.    ALPRAZolam 0.5 MG tablet    Commonly known as: XANAX    Take 0.5 mg by mouth at bedtime.    amoxicillin-clavulanate 875-125 MG per tablet    Commonly known as: AUGMENTIN    Take 1 tablet by mouth every 12 (twelve) hours.    atorvastatin 80 MG tablet    Commonly known as: LIPITOR    Take 80 mg by mouth at bedtime.    clopidogrel 75 MG tablet    Commonly known as: PLAVIX    Take 1 tablet (75 mg total) by mouth daily.    COMPLETE PROTEIN/VITAMIN SHAKE PO    Take 2-3 Bottles by mouth daily as needed (nutritional supplment).    diphenoxylate-atropine 2.5-0.025 MG per tablet    Commonly known as: LOMOTIL    Take  1 tablet by mouth 4 (four) times daily as needed for diarrhea or loose stools.    donepezil 5 MG tablet    Commonly known as: ARICEPT    Take 1 tablet (5 mg total) by mouth at bedtime.    fluticasone 50 MCG/ACT nasal spray    Commonly known as: FLONASE    Place 2 sprays into both nostrils 2 (two) times daily as needed for allergies or rhinitis.    fluticasone-salmeterol 45-21 MCG/ACT inhaler    Commonly known as: ADVAIR HFA    Inhale 2 puffs into the lungs 2 (two) times daily as needed (wheezing and shortness of breath).    gabapentin 300 MG capsule    Commonly known as: NEURONTIN    Take 300 mg by mouth at bedtime.    ibuprofen 200 MG tablet    Commonly known as: ADVIL,MOTRIN    Take 400-600 mg by mouth every 6 (six) hours as needed for moderate pain.    isosorbide mononitrate 60 MG 24 hr tablet    Commonly known as: IMDUR    Take 60 mg by mouth every evening.    losartan 50 MG tablet    Commonly known as: COZAAR    Take 50 mg by mouth daily.    metoCLOPramide 5 MG tablet    Commonly known as: REGLAN    Take 5 mg by mouth 2 (two) times daily.    metoprolol tartrate 25 MG tablet    Commonly known as: LOPRESSOR    Take 0.5 tablets (12.5 mg total) by mouth 2 (two) times daily.    ondansetron 8 MG tablet    Commonly known  as: ZOFRAN    Take 8 mg by mouth every 8 (eight) hours as needed for nausea or vomiting.    oxyCODONE-acetaminophen 10-325 MG per tablet    Commonly known as: PERCOCET    Take 1 tablet by mouth 5 (five) times daily as needed for pain.    pantoprazole 40 MG tablet    Commonly known as: PROTONIX    Take 40 mg by mouth daily.    predniSONE 20 MG tablet    Commonly known as: DELTASONE    Take 3 tablets (60 mg total) by mouth daily with breakfast.    topiramate 25 MG tablet    Commonly known as: TOPAMAX    Take 50 mg by mouth daily.      The concern was for aspiration pneumonia vs pneumonitis coupled with her COPD.  patient was treated with Augmentin. She is also recommended to discontinue MS Contin. She is here today for followup.  Patient is a coming by her daughter. They question how she could have gotten so cick so quick when I stated her lungs "were clear" on Thursday.  I tried to explain that her lungs were not "clear", but rather I did not appreciate any pneumonia on Thursday.  I still felt the patient was having a severe COPD exacerbation. I believe it likely due to a combination of her severe COPD exacerbation, sedative effects of her chronic topics, her continued smoking, an aspiration event occurred creating aspiration pneumonitis/pneumonia which prompted her to go the hospital. I stated that hindsight being 20/20, I wish I would have sent her to the hospital on Thursday.  However, I stand by by medical decision at the time.  She continues to smoke. She is also resumed using MS Contin but only once a day.  Fortunately her breathing has returned to his baseline. Her  blood pressure is elevated today. She also complains of watery diarrhea. Evaluation in the hospital showed no evidence of C. difficile colitis. She has been on numerous antibiotics the last 2 weeks and she is also partially withdrawing from narcotics all which could be contributing.  Past Medical History  Diagnosis Date  . Low  back pain   . Bronchitis   . Chronic hoarseness   . Sleep apnea, obstructive   . Renal artery stenosis   . PVD (peripheral vascular disease)   . Hypercholesteremia   . Pain in limb   . COPD (chronic obstructive pulmonary disease)   . Benign essential hypertension   . Atherosclerosis     coronary vessel type  . Coronary artery disease   . Diverticulosis   . History of GI diverticular bleed   . History of esophagitis   . H. pylori infection   . Altered mental status   . Pneumonia   . TIA (transient ischemic attack) 07/2010  . Arthritis   . GERD (gastroesophageal reflux disease)   . History of blood clots   . Tubular adenoma 2012  . Heart murmur   . CHF (congestive heart failure)   . Anginal pain   . Myocardial infarct 1990's    "I've only had one" (12/06/2012)  . Exertional shortness of breath   . Daily headache   . Anxiety   . Gastric ulcer 07/2012    large; required clipping Archie Endo 08/23/2012 (12/06/2012)   Past Surgical History  Procedure Laterality Date  . Oophorectomy    . Coronary angioplasty with stent placement  2006    Cypher stent to RCA. On cath in 2008 stent described as widely patent.   . Esophagogastroduodenoscopy N/A 08/21/2012    Procedure: ESOPHAGOGASTRODUODENOSCOPY (EGD);  Surgeon: Lafayette Dragon, MD;  Location: [facility]COPY;  Service: Endoscopy;  Laterality: N/A;  . Vaginal hysterectomy    . Esophagogastroduodenoscopy N/A 12/07/2012    Procedure: ESOPHAGOGASTRODUODENOSCOPY (EGD);  Surgeon: Gatha Mayer, MD;  Location: [facility] ENDOSCOPY;  Service: Endoscopy;  Laterality: N/A;   Current Outpatient Prescriptions on File Prior to Visit  Medication Sig Dispense Refill  . albuterol (PROAIR HFA) 108 (90 BASE) MCG/ACT inhaler Inhale 2 puffs into the lungs every 4 (four) hours as needed for wheezing.  1 Inhaler  6  . ALPRAZolam (XANAX) 0.5 MG tablet Take 0.5 mg by mouth at bedtime.       Marland Kitchen atorvastatin (LIPITOR) 80 MG tablet Take 1 tablet (80 mg total) by mouth at  bedtime.  30 tablet  2  . clopidogrel (PLAVIX) 75 MG tablet Take 1 tablet (75 mg total) by mouth daily.  90 tablet  1  . diphenoxylate-atropine (LOMOTIL) 2.5-0.025 MG per tablet Take 1 tablet by mouth 4 (four) times daily as needed for diarrhea or loose stools.  30 tablet  0  . donepezil (ARICEPT) 5 MG tablet Take 1 tablet (5 mg total) by mouth at bedtime.  30 tablet  11  . fluticasone (FLONASE) 50 MCG/ACT nasal spray Place 2 sprays into both nostrils 2 (two) times daily as needed for allergies or rhinitis.      . fluticasone-salmeterol (ADVAIR HFA) 45-21 MCG/ACT inhaler Inhale 2 puffs into the lungs 2 (two) times daily as needed (wheezing and shortness of breath).  1 Inhaler  5  . gabapentin (NEURONTIN) 300 MG capsule Take 300 mg by mouth at bedtime.      Marland Kitchen ibuprofen (ADVIL,MOTRIN) 200 MG tablet Take 400-600 mg by mouth every 6 (six)  hours as needed for moderate pain.      . isosorbide mononitrate (IMDUR) 60 MG 24 hr tablet Take 60 mg by mouth every evening.       . metoCLOPramide (REGLAN) 5 MG tablet Take 5 mg by mouth 2 (two) times daily.      . metoprolol tartrate (LOPRESSOR) 25 MG tablet Take 0.5 tablets (12.5 mg total) by mouth 2 (two) times daily.      . Nutritional Supplements (COMPLETE PROTEIN/VITAMIN SHAKE PO) Take 2-3 Bottles by mouth daily as needed (nutritional supplment).       . ondansetron (ZOFRAN) 8 MG tablet Take 8 mg by mouth every 8 (eight) hours as needed for nausea or vomiting.      Marland Kitchen oxyCODONE-acetaminophen (PERCOCET) 10-325 MG per tablet Take 1 tablet by mouth 5 (five) times daily as needed for pain.      . pantoprazole (PROTONIX) 40 MG tablet Take 1 tablet (40 mg total) by mouth daily.  30 tablet  1  . promethazine (PHENERGAN) 25 MG tablet Take 1 tablet (25 mg total) by mouth at bedtime.  30 tablet  0  . topiramate (TOPAMAX) 25 MG tablet Take 50 mg by mouth daily.        No current facility-administered medications on file prior to visit.   No Known Allergies History    Social History  . Marital Status: Divorced    Spouse Name: N/A    Number of Children: 6  . Years of Education: N/A   Occupational History  . RETIRED    Social History Main Topics  . Smoking status: Current Every Day Smoker -- 0.50 packs/day for 60 years    Types: Cigarettes  . Smokeless tobacco: Never Used  . Alcohol Use: No     Comment: occasional  . Drug Use: No  . Sexual Activity: No   Other Topics Concern  . Not on file   Social History Narrative   4-6 cups of coffee daily       Review of Systems  All other systems reviewed and are negative.      Objective:   Physical Exam  Cardiovascular: Normal rate, regular rhythm and normal heart sounds.   Pulmonary/Chest: Effort normal. No respiratory distress. She has wheezes. She has no rales. She exhibits no tenderness.  Abdominal: Soft. Bowel sounds are normal.  Musculoskeletal: She exhibits no edema.          Assessment & Plan:  Diarrhea - Plan: Gastrointestinal Pathogen Panel PCR  Hospital discharge follow-up  I told the patient that I was sorry she got so sick and I'm glad she is better.  However I stated that this will inevitably happen again if she continues making the lifestyle choice that she is making.  She needs to reduce her consumption of narcotics and she must quit smoking. The patient is unable to drive and therefore her family is buying her cigarettes for her. I told the daughter to stop buying the patient cigarettes. I recommended she start the patient on a 14 mg nicotine patch to help chemical withdrawal. I recommended that she ration her cigarettes to 5 a day and then gradually wean her down on them until she is off all cigarettes in 2 weeks. Also recommended that she no longer use MS Contin and that the daughter supervise the use of Percocet.  While I stand by my decision to treat her for COPD exacerbation last visit, I explained to the patient that she has to accept some  responsibility for her  healthcare and her illness. If she continues to smoke and use high-dose narcotics this could and likely will happen again regardless of the medical decisions I make.  In the future I would like to wean the patient off Topamax.

## 2013-10-18 ENCOUNTER — Encounter: Payer: Self-pay | Admitting: *Deleted

## 2013-10-18 ENCOUNTER — Encounter: Payer: Self-pay | Admitting: Internal Medicine

## 2013-10-18 ENCOUNTER — Ambulatory Visit (AMBULATORY_SURGERY_CENTER): Payer: Commercial Managed Care - HMO | Admitting: Internal Medicine

## 2013-10-18 VITALS — BP 172/77 | HR 62 | Temp 98.7°F | Resp 20 | Ht 61.0 in | Wt 91.0 lb

## 2013-10-18 DIAGNOSIS — K2960 Other gastritis without bleeding: Secondary | ICD-10-CM | POA: Diagnosis not present

## 2013-10-18 DIAGNOSIS — R112 Nausea with vomiting, unspecified: Secondary | ICD-10-CM | POA: Diagnosis not present

## 2013-10-18 DIAGNOSIS — K2940 Chronic atrophic gastritis without bleeding: Secondary | ICD-10-CM

## 2013-10-18 DIAGNOSIS — K2970 Gastritis, unspecified, without bleeding: Secondary | ICD-10-CM

## 2013-10-18 MED ORDER — SODIUM CHLORIDE 0.9 % IV SOLN: 500.0000 mL | INTRAVENOUS | Status: DC

## 2013-10-18 NOTE — Patient Instructions (Signed)
Information sheets given on Gastritis and Hiiatal Hernia.  Continue plavix.  Follow up with Dr Olevia Perches in 8 weeks.  Her office will call with appointment date and time.  YOU HAD AN ENDOSCOPIC PROCEDURE TODAY AT [facility]: Refer to the procedure report that was given to you for any specific questions about what was found during the examination.  If the procedure report does not answer your questions, please call your gastroenterologist to clarify.  If you requested that your care partner not be given the details of your procedure findings, then the procedure report has been included in a sealed envelope for you to review at your convenience later.  YOU SHOULD EXPECT: Some feelings of bloating in the abdomen. Passage of more gas than usual.  Walking can help get rid of the air that was put into your GI tract during the procedure and reduce the bloating. If you had a lower endoscopy (such as a colonoscopy or flexible sigmoidoscopy) you may notice spotting of blood in your stool or on the toilet paper. If you underwent a bowel prep for your procedure, then you may not have a normal bowel movement for a few days.  DIET: Your first meal following the procedure should be a light meal and then it is ok to progress to your normal diet.  A half-sandwich or bowl of soup is an example of a good first meal.  Heavy or fried foods are harder to digest and may make you feel nauseous or bloated.  Likewise meals heavy in dairy and vegetables can cause extra gas to form and this can also increase the bloating.  Drink plenty of fluids but you should avoid alcoholic beverages for 24 hours.  ACTIVITY: Your care partner should take you home directly after the procedure.  You should plan to take it easy, moving slowly for the rest of the day.  You can resume normal activity the day after the procedure however you should NOT DRIVE or use heavy machinery for 24 hours (because of the sedation medicines used during  the test).    SYMPTOMS TO REPORT IMMEDIATELY: A gastroenterologist can be reached at any hour.  During normal business hours, 8:30 AM to 5:00 PM Monday through Friday, call [phone].  After hours and on weekends, please call the GI answering service at [phone] who will take a message and have the physician on call contact you.   Following upper endoscopy (EGD)  Vomiting of blood or coffee ground material  New chest pain or pain under the shoulder blades  Painful or persistently difficult swallowing  New shortness of breath  Fever of 100F or higher  Black, tarry-looking stools  FOLLOW UP: If any biopsies were taken you will be contacted by phone or by letter within the next 1-3 weeks.  Call your gastroenterologist if you have not heard about the biopsies in 3 weeks.  Our staff will call the home number listed on your records the next business day following your procedure to check on you and address any questions or concerns that you may have at that time regarding the information given to you following your procedure. This is a courtesy call and so if there is no answer at the home number and we have not heard from you through the emergency physician on call, we will assume that you have returned to your regular daily activities without incident.  SIGNATURES/CONFIDENTIALITY: You and/or your care partner have signed paperwork which will be entered  into your electronic medical record.  These signatures attest to the fact that that the information above on your After Visit Summary has been reviewed and is understood.  Full responsibility of the confidentiality of this discharge information lies with you and/or your care-partner.

## 2013-10-18 NOTE — Addendum Note (Signed)
Addended by: WRAY, [place] on: 10/18/2013 11:00 AM   Modules accepted: Orders

## 2013-10-18 NOTE — Progress Notes (Signed)
  [facility] Post-op Note  Patient: Kathy Marshall  Procedure(s) Performed: EGD  Patient Location: [facility] - Recovery Area  Anesthesia Type: Deep Sedation/Propofol  Level of Consciousness: awake, oriented and patient cooperative  Airway and Oxygen Therapy: Patient Spontanous Breathing  Post-op Pain: none  Post-op Assessment:  Post-op Vital signs reviewed, Patient's Cardiovascular Status Stable, Respiratory Function Stable, Patent Airway, No signs of Nausea or vomiting and Pain level controlled  Post-op Vital Signs: Reviewed and stable  Complications: No apparent anesthesia complications  Bazil Dhanani E Iyad Deroo 11:29 AM

## 2013-10-18 NOTE — Progress Notes (Signed)
Called to room to assist during endoscopic procedure.  Patient ID and intended procedure confirmed with present staff. Received instructions for my participation in the procedure from the performing physician.  

## 2013-10-18 NOTE — Op Note (Addendum)
[facility]  [address] [place], [id number]   ENDOSCOPY PROCEDURE REPORT  PATIENT: Tanisia, [name]  MR#: [id number] BIRTHDATE: [birth year] , 56  yrs. old GENDER: Female ENDOSCOPIST: Lafayette Dragon, MD REFERRED BY:  Jenna Luo, M.D. PROCEDURE DATE:  10/18/2013 PROCEDURE:  EGD w/ biopsy ASA CLASS:     Class III INDICATIONS:  Nausea.   Vomiting.   story of bleeding gastric ulcer in February 2014.  Repeat endoscopy in June 2014 showed healed ulcer she has a history of H.  pylori gastritis.  She has been on Plavix.  Roused hemoglobin 14.6.   Epigastric pain. MEDICATIONS: MAC sedation, administered by CRNA and propofol (Diprivan) 150mg  IV TOPICAL ANESTHETIC: Cetacaine Spray  DESCRIPTION OF PROCEDURE: After the risks benefits and alternatives of the procedure were thoroughly explained, informed consent was obtained.  The LB [id number] endoscope was introduced through the mouth and advanced to the second portion of the duodenum. Without limitations.  The instrument was slowly withdrawn as the mucosa was fully examined.      Esophagus : Proximal mid and distal esophagus appeared normal. Z line was regular. There was no esophagitis. It were no varices  Stomach : There was a small reducible hiatal hernia which measured 2-3 cm. The entire gastric mucosa was severely inflamed with erosions, coffee ground specks and erythema which extended to the gastric antrum and into pyloric outlet. There was no obstruction and no ulcer. Retroflexion of the endoscope revealed normal fundus and cardia. Biopsies were taken from gastric antrum  Duodenum: Duodenal bulb was  inflamed with erythema but no bleeding or erosions. Descending duodenum was normal[         The scope was then withdrawn from the patient and the procedure completed.  COMPLICATIONS: There were no complications. ENDOSCOPIC IMPRESSION:  moderately severe to severe gastritis with coffee ground  specks. Biopsies taken to rule out H. pylori. No evidence of recurrent ulcer Mild duodenitis in duodenal bulb No evidence of gastric or duodenal outlet obstruction  RECOMMENDATIONS: 1.  Await pathology results 2.  Continue PPI 3.  Small frequent feedings Ensure supplements Continue Zofran during the day and Phenergan 25 mg at bedtime Office visit 8 weeks 4.  Small frequent feedings Ensure supplements Continue Zofran during the day and Phenergan 25 mg at bedtime Office visit 8 weeks 5. 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  eSigned:  Lafayette Dragon, MD 10/18/2013 11:43 AM Revised: 10/18/2013 11:43 AM  CC:  PATIENT NAME:  Kathy, Marshall MR#: [id number]

## 2013-10-20 ENCOUNTER — Other Ambulatory Visit: Payer: Self-pay | Admitting: Family Medicine

## 2013-10-21 ENCOUNTER — Other Ambulatory Visit: Payer: Self-pay

## 2013-10-21 ENCOUNTER — Telehealth: Payer: Self-pay | Admitting: *Deleted

## 2013-10-21 MED ORDER — METOPROLOL TARTRATE 25 MG PO TABS: 12.5000 mg | ORAL_TABLET | Freq: Two times a day (BID) | ORAL | Status: DC

## 2013-10-21 NOTE — Telephone Encounter (Signed)
?   OK to Refill  

## 2013-10-21 NOTE — Telephone Encounter (Signed)
  Follow up Call-  Call back number 10/18/2013 03/03/2011  Post procedure Call Back phone  # [phone]  Permission to leave phone message Yes -     Patient questions:  Do you have a fever, pain , or abdominal swelling? no Pain Score  0 *  Have you tolerated food without any problems? yes  Have you been able to return to your normal activities? yes  Do you have any questions about your discharge instructions: Diet   no Medications  no Follow up visit  no  Do you have questions or concerns about your Care? no  Actions: * If pain score is 4 or above: No action needed, pain <4.  Pt. Stated everything  turned out great.

## 2013-10-21 NOTE — Telephone Encounter (Signed)
Medication called to pharmacy. 

## 2013-10-21 NOTE — Telephone Encounter (Signed)
Rx was sent to pharmacy electronically. 

## 2013-10-21 NOTE — Telephone Encounter (Signed)
ok 

## 2013-10-22 ENCOUNTER — Telehealth: Payer: Self-pay | Admitting: *Deleted

## 2013-10-22 ENCOUNTER — Other Ambulatory Visit: Payer: Self-pay | Admitting: *Deleted

## 2013-10-22 ENCOUNTER — Encounter: Payer: Self-pay | Admitting: Internal Medicine

## 2013-10-22 MED ORDER — METOPROLOL TARTRATE 25 MG PO TABS: 12.5000 mg | ORAL_TABLET | Freq: Two times a day (BID) | ORAL | Status: DC

## 2013-10-22 NOTE — Telephone Encounter (Signed)
pts daughter called stating that [facility] nurse came out today and checked pts vitals HR 59, temp 98.3, BP 138/78, O2 84-89% at 2L, [place] put it to 3L and it was only at 88% put it on 3 1/2L it went up to 90%, wheezing in upper lungs per daughter. Wants to know what you want her to do now.

## 2013-10-22 NOTE — Telephone Encounter (Signed)
Have the patient use 2.5 mg albuterol rescue nebulizer.  NTBS if no better after albuterol.

## 2013-10-22 NOTE — Telephone Encounter (Signed)
Metoprolol Tart refilled with a 30 day supply and no refills.  Needs to keep appt 11/11/13 with Dr. Claiborne Billings.

## 2013-10-22 NOTE — Telephone Encounter (Signed)
Pt daughter is aware and will bring her in if symptoms worsen.

## 2013-10-24 ENCOUNTER — Encounter: Payer: Self-pay | Admitting: *Deleted

## 2013-10-31 ENCOUNTER — Telehealth: Payer: Self-pay | Admitting: Internal Medicine

## 2013-10-31 NOTE — Telephone Encounter (Signed)
Spoke with patient's daughter and reivewed pathology results as per letter.

## 2013-11-11 ENCOUNTER — Ambulatory Visit: Payer: Medicare PPO | Admitting: Cardiovascular Disease

## 2013-11-11 ENCOUNTER — Telehealth: Payer: Self-pay | Admitting: Family Medicine

## 2013-11-11 MED ORDER — DIPHENOXYLATE-ATROPINE 2.5-0.025 MG PO TABS: 1.0000 | ORAL_TABLET | Freq: Four times a day (QID) | ORAL | Status: DC | PRN

## 2013-11-11 NOTE — Telephone Encounter (Signed)
Patients daughter calling to see if we can send over rx for diphenoxylate-atropine (LOMOTIL) 2.5-0.025 MG per tablet That's what she was taking in the hospital [phone]

## 2013-11-11 NOTE — Telephone Encounter (Signed)
Med faxed to pharm and pt aware NTBS per vm

## 2013-11-11 NOTE — Telephone Encounter (Signed)
?   OK to Refill  

## 2013-11-11 NOTE — Telephone Encounter (Signed)
Patient can have 20 tabs of lomotil 2 poq6 hrs prn diarrhea.  NTBS if no better.

## 2013-11-12 ENCOUNTER — Other Ambulatory Visit: Payer: Self-pay | Admitting: Internal Medicine

## 2013-11-14 ENCOUNTER — Ambulatory Visit: Payer: 59 | Admitting: Family Medicine

## 2013-11-21 ENCOUNTER — Ambulatory Visit: Payer: 59 | Admitting: Family Medicine

## 2013-11-25 ENCOUNTER — Encounter: Payer: Self-pay | Admitting: Family Medicine

## 2013-11-25 ENCOUNTER — Telehealth: Payer: Self-pay | Admitting: Family Medicine

## 2013-11-25 NOTE — Telephone Encounter (Signed)
Pt is moving.  Hasn't used Oxygen in several months.  Needs [place] to send discontinue order so they will come get equipment.  If we send discontinue order easier to resume, rather then patient signing waiver.  That will prevent her resuming in the future.  Please send to [facility] Fax [phone]  Ph [phone] Letter printed and faxed to [place].

## 2013-11-27 ENCOUNTER — Ambulatory Visit: Payer: Medicare PPO | Admitting: Cardiovascular Disease

## 2013-11-28 ENCOUNTER — Other Ambulatory Visit: Payer: Self-pay | Admitting: Family Medicine

## 2013-11-28 NOTE — Telephone Encounter (Signed)
Refill appropriate and filled per protocol. 

## 2013-11-29 ENCOUNTER — Other Ambulatory Visit: Payer: Self-pay | Admitting: *Deleted

## 2013-11-29 MED ORDER — METOPROLOL TARTRATE 25 MG PO TABS: 12.5000 mg | ORAL_TABLET | Freq: Two times a day (BID) | ORAL | Status: DC

## 2013-12-05 ENCOUNTER — Other Ambulatory Visit: Payer: Self-pay | Admitting: Family Medicine

## 2013-12-10 ENCOUNTER — Ambulatory Visit: Payer: Commercial Managed Care - HMO | Admitting: Internal Medicine

## 2013-12-13 ENCOUNTER — Other Ambulatory Visit: Payer: Self-pay | Admitting: Internal Medicine

## 2013-12-13 ENCOUNTER — Ambulatory Visit (INDEPENDENT_AMBULATORY_CARE_PROVIDER_SITE_OTHER): Payer: Commercial Managed Care - HMO | Admitting: Internal Medicine

## 2013-12-13 ENCOUNTER — Encounter: Payer: Self-pay | Admitting: Internal Medicine

## 2013-12-13 ENCOUNTER — Other Ambulatory Visit: Payer: Self-pay | Admitting: Physician Assistant

## 2013-12-13 ENCOUNTER — Other Ambulatory Visit (INDEPENDENT_AMBULATORY_CARE_PROVIDER_SITE_OTHER): Payer: Commercial Managed Care - HMO

## 2013-12-13 VITALS — BP 140/86 | HR 85 | Ht 61.0 in | Wt 86.0 lb

## 2013-12-13 DIAGNOSIS — K2960 Other gastritis without bleeding: Secondary | ICD-10-CM

## 2013-12-13 DIAGNOSIS — I771 Stricture of artery: Principal | ICD-10-CM

## 2013-12-13 DIAGNOSIS — K551 Chronic vascular disorders of intestine: Secondary | ICD-10-CM

## 2013-12-13 DIAGNOSIS — R634 Abnormal weight loss: Secondary | ICD-10-CM

## 2013-12-13 LAB — BUN: BUN: 13 mg/dL (ref 6–23)

## 2013-12-13 LAB — CREATININE, SERUM: CREATININE: 0.6 mg/dL (ref 0.4–1.2)

## 2013-12-13 MED ORDER — ONDANSETRON HCL 8 MG PO TABS: ORAL_TABLET | ORAL | Status: DC

## 2013-12-13 MED ORDER — PANTOPRAZOLE SODIUM 40 MG PO TBEC: 40.0000 mg | DELAYED_RELEASE_TABLET | Freq: Two times a day (BID) | ORAL | Status: DC

## 2013-12-13 NOTE — Patient Instructions (Addendum)
We have sent the following medications to your pharmacy for you to pick up at your convenience: Pantoprazole twice daily Zofran 1 tablet twice daily as needed  Please purchase the following medications over the counter and take as directed: Gaviscon-Chew 1-2 tablets after meals for abdominal pain  Your physician has requested that you go to the basement for the following lab work before leaving today: BUN, Creatinine  You have been scheduled for a CT angiogram scan of the abdomen and pelvis at [facility] ([address]---this is in the same building as [facility]).   You are scheduled on 12/19/13 at 11:00 am. You should arrive 15 minutes prior to your appointment time for registration. Please follow the written instructions below on the day of your exam:  WARNING: IF YOU ARE ALLERGIC TO IODINE/X-RAY DYE, PLEASE NOTIFY RADIOLOGY IMMEDIATELY AT [phone]! YOU WILL BE GIVEN A 13 HOUR PREMEDICATION PREP.  1) Do not eat or drink anything after 9:00 am (2 hours prior to your test)  If you have any questions regarding your exam or if you need to reschedule, you may call the CT department at [phone] between the hours of 8:00 am and 5:00 pm, Monday-Friday.  ________________________________________________________________________

## 2013-12-13 NOTE — Progress Notes (Signed)
Kathy Marshall February 06, 1935 [id number]  Note: This dictation was prepared with Dragon digital system. Any transcriptional errors that result from this procedure are unintentional.   History of Present Illness:  This is a 79 year old white female with a two-month history of nausea and vomiting, weight loss and evidence of extensive vascular mesenteric calcification on a CT scan of the abdomen from September 2014 raising a question of SMA stenosis. She underwent an upper endoscopy 2 months ago with findings of a healed gastric ulcer ,with biopsies showing severe gastritis with chronic atrophy . She is complaining of epigastric pain. She has been taking  Zofran and pantoprazole. She continues to smoke. She weighs 86 pounds today. She complains of nausea, postprandial fullness and early satiety    Past Medical History  Diagnosis Date  . Low back pain   . Bronchitis   . Chronic hoarseness   . Sleep apnea, obstructive   . Renal artery stenosis   . PVD (peripheral vascular disease)   . Hypercholesteremia   . Pain in limb   . COPD (chronic obstructive pulmonary disease)   . Benign essential hypertension   . Atherosclerosis     coronary vessel type  . Coronary artery disease   . Diverticulosis   . History of GI diverticular bleed   . History of esophagitis   . H. pylori infection   . Altered mental status   . Pneumonia   . TIA (transient ischemic attack) 07/2010  . Arthritis   . GERD (gastroesophageal reflux disease)   . History of blood clots   . Tubular adenoma 2012  . Heart murmur   . CHF (congestive heart failure)   . Anginal pain   . Myocardial infarct 1990's    "I've only had one" (12/06/2012)  . Exertional shortness of breath   . Daily headache   . Anxiety   . Gastric ulcer 07/2012    large; required clipping Archie Endo 08/23/2012 (12/06/2012)    Past Surgical History  Procedure Laterality Date  . Oophorectomy    . Coronary angioplasty with stent placement  2006   Cypher stent to RCA. On cath in 2008 stent described as widely patent.   . Esophagogastroduodenoscopy N/A 08/21/2012    Procedure: ESOPHAGOGASTRODUODENOSCOPY (EGD);  Surgeon: Lafayette Dragon, MD;  Location: [facility] ENDOSCOPY;  Service: Endoscopy;  Laterality: N/A;  . Vaginal hysterectomy    . Esophagogastroduodenoscopy N/A 12/07/2012    Procedure: ESOPHAGOGASTRODUODENOSCOPY (EGD);  Surgeon: Gatha Mayer, MD;  Location: [facility] ENDOSCOPY;  Service: Endoscopy;  Laterality: N/A;    No Known Allergies  Family history and social history have been reviewed.  Review of Systems:   The remainder of the 10 point ROS is negative except as outlined in the H&P  Physical Exam: General Appearance very slim ill-appearing, smells of cigarettes, in no distress Eyes  Non icteric  HEENT  Non traumatic, normocephalic  Mouth No lesion, tongue papillated, no cheilosis partially edentulous Neck Supple without adenopathy, thyroid not enlarged, no carotid bruits, no JVD Lungs Clear to auscultation bilaterally COR Normal S1, normal S2, regular rhythm, no murmur, quiet precordium Abdomen scaphoid pain with normoactive bowel sounds. Mild tenderness around the umbilicus. No bruit Rectal not repeated Extremities  No pedal edema Skin No lesions Neurological Alert and oriented x 3 Psychological Normal mood and affect  Assessment and Plan:   Problem #1 Continued nausea and abdominal pain in a smoker with extensive vascular calcifications. An upper endoscopy recently showed a healed gastric ulcer and severe gastritis..We  will increase pantoprazole to 40 mg twice a day and will use Zofran 4 mg twice a day. We will proceed with a CT angiogram to assess the SMA stenosis.to r/o intestinal angina.Soft diet.     Delfin Edis 12/13/2013

## 2013-12-18 ENCOUNTER — Telehealth: Payer: Self-pay | Admitting: *Deleted

## 2013-12-18 NOTE — Telephone Encounter (Signed)
Received call from pharmacy. Reports that patient daughter called in refill for Xanax 0.5mg  (1) tab PO QD PRN, but it is (4) days early. Patient daughter reports that medication is completely out. Patient daughter reports that patient has had increased confusion and anxiety d/t dementia, and she has had to give patient more medication than prescribed.   Pharmacy requesting refill on medication and requesting approval to fill early. Last office visit 10/17/2013. Last refill 10/21/2013 with #2 refills.  Call placed to [facility], patient daughter to inquire about patient status. [place].   MD please advise.

## 2013-12-19 ENCOUNTER — Ambulatory Visit (INDEPENDENT_AMBULATORY_CARE_PROVIDER_SITE_OTHER)
Admission: RE | Admit: 2013-12-19 | Discharge: 2013-12-19 | Disposition: A | Discharge status: Home / Self Care | Payer: Commercial Managed Care - HMO | Source: Ambulatory Visit | Attending: Internal Medicine | Admitting: Internal Medicine

## 2013-12-19 DIAGNOSIS — K551 Chronic vascular disorders of intestine: Secondary | ICD-10-CM

## 2013-12-19 DIAGNOSIS — I771 Stricture of artery: Principal | ICD-10-CM

## 2013-12-19 MED ORDER — IOHEXOL 350 MG/ML SOLN
80.0000 mL | Freq: Once | INTRAVENOUS | Status: AC | PRN
  Administered 2013-12-19: 80 mL via INTRAVENOUS

## 2013-12-19 NOTE — Telephone Encounter (Signed)
Rx called into pharmacy  #30 + 2 refills.  I spoke with daughter and made her aware.  Told her if mother continues to more agitated and require more medication more frequently, make appt for her to be evaluated by provider

## 2013-12-19 NOTE — Telephone Encounter (Signed)
Ok with refill 

## 2013-12-30 ENCOUNTER — Ambulatory Visit: Payer: 59 | Admitting: Family Medicine

## 2014-01-06 ENCOUNTER — Encounter: Payer: Self-pay | Admitting: Family Medicine

## 2014-01-06 ENCOUNTER — Ambulatory Visit (INDEPENDENT_AMBULATORY_CARE_PROVIDER_SITE_OTHER): Payer: Commercial Managed Care - HMO | Admitting: Family Medicine

## 2014-01-06 VITALS — BP 136/74 | HR 84 | Temp 98.8°F | Resp 18 | Ht 62.0 in | Wt 85.0 lb

## 2014-01-06 DIAGNOSIS — R634 Abnormal weight loss: Secondary | ICD-10-CM

## 2014-01-06 DIAGNOSIS — I701 Atherosclerosis of renal artery: Secondary | ICD-10-CM

## 2014-01-06 DIAGNOSIS — F411 Generalized anxiety disorder: Secondary | ICD-10-CM

## 2014-01-06 MED ORDER — MIRTAZAPINE 30 MG PO TABS: 30.0000 mg | ORAL_TABLET | Freq: Every day | ORAL | Status: DC

## 2014-01-06 MED ORDER — ALPRAZOLAM 0.5 MG PO TABS: ORAL_TABLET | ORAL | Status: DC

## 2014-01-06 NOTE — Progress Notes (Signed)
Subjective:    Patient ID: Kathy Marshall, female    DOB: Mar 12, 1935, 79 y.o.   MRN: [id number]  HPI Patient was recently seen by her gastroenterologist for abdominal pain. They performed a workup to evaluate for ischemic colitis. There was a question of a finding of 8 stenosis of the left renal artery. He is here today to discuss that. They would like to see a specialist regarding renal artery stenosis. However her blood pressure is well controlled at 136/74 and her creatinine has remained stable with no evidence of renal insufficiency. Unfortunately her anxiety is worsening.  She is unable to sleep at night. She is extremely tearful today on exam. She reports depression and Anhedonia.  She denies any suicidal ideation. She denies any hallucinations. However one Xanax a day is not helping control his anxiety. Also she continues to lose weight. They did discontinue topamax less than 1 month ago however she continues to lose weight. She is not taking Ensure every day as recommended. Past Medical History  Diagnosis Date  . Low back pain   . Bronchitis   . Chronic hoarseness   . Sleep apnea, obstructive   . Renal artery stenosis   . PVD (peripheral vascular disease)   . Hypercholesteremia   . Pain in limb   . COPD (chronic obstructive pulmonary disease)   . Benign essential hypertension   . Atherosclerosis     coronary vessel type  . Coronary artery disease   . Diverticulosis   . History of GI diverticular bleed   . History of esophagitis   . H. pylori infection   . Altered mental status   . Pneumonia   . TIA (transient ischemic attack) 07/2010  . Arthritis   . GERD (gastroesophageal reflux disease)   . History of blood clots   . Tubular adenoma 2012  . Heart murmur   . CHF (congestive heart failure)   . Anginal pain   . Myocardial infarct 1990's    "I've only had one" (12/06/2012)  . Exertional shortness of breath   . Daily headache   . Anxiety   . Gastric ulcer 07/2012   large; required clipping Archie Endo 08/23/2012 (12/06/2012)   Past Surgical History  Procedure Laterality Date  . Oophorectomy    . Coronary angioplasty with stent placement  2006    Cypher stent to RCA. On cath in 2008 stent described as widely patent.   . Esophagogastroduodenoscopy N/A 08/21/2012    Procedure: ESOPHAGOGASTRODUODENOSCOPY (EGD);  Surgeon: Lafayette Dragon, MD;  Location: [facility] ENDOSCOPY;  Service: Endoscopy;  Laterality: N/A;  . Vaginal hysterectomy    . Esophagogastroduodenoscopy N/A 12/07/2012    Procedure: ESOPHAGOGASTRODUODENOSCOPY (EGD);  Surgeon: Gatha Mayer, MD;  Location: [facility] ENDOSCOPY;  Service: Endoscopy;  Laterality: N/A;   Current Outpatient Prescriptions on File Prior to Visit  Medication Sig Dispense Refill  . acetaminophen (TYLENOL) 325 MG tablet Take 650 mg by mouth every 6 (six) hours as needed.      Marland Kitchen albuterol (PROAIR HFA) 108 (90 BASE) MCG/ACT inhaler Inhale 2 puffs into the lungs every 4 (four) hours as needed for wheezing.  1 Inhaler  6  . atorvastatin (LIPITOR) 80 MG tablet Take 1 tablet (80 mg total) by mouth at bedtime.  30 tablet  2  . clopidogrel (PLAVIX) 75 MG tablet Take 1 tablet (75 mg total) by mouth daily.  90 tablet  1  . diphenoxylate-atropine (LOMOTIL) 2.5-0.025 MG per tablet Take 1 tablet by mouth 4 (four)  times daily as needed for diarrhea or loose stools.  20 tablet  0  . donepezil (ARICEPT) 5 MG tablet Take 1 tablet (5 mg total) by mouth at bedtime.  30 tablet  11  . fluticasone (FLONASE) 50 MCG/ACT nasal spray Place 2 sprays into both nostrils 2 (two) times daily as needed for allergies or rhinitis.      . fluticasone-salmeterol (ADVAIR HFA) 45-21 MCG/ACT inhaler Inhale 2 puffs into the lungs 2 (two) times daily as needed (wheezing and shortness of breath).  1 Inhaler  5  . gabapentin (NEURONTIN) 300 MG capsule TAKE 1 CAPSULE BY MOUTH DAILY AT BEDTIME  30 capsule  5  . isosorbide mononitrate (IMDUR) 60 MG 24 hr tablet Take 60 mg by mouth every  evening.       Marland Kitchen losartan (COZAAR) 50 MG tablet       . metoCLOPramide (REGLAN) 5 MG tablet TAKE 1 TABLET BY MOUTH TWICE A DAY  60 tablet  1  . metoprolol tartrate (LOPRESSOR) 25 MG tablet Take 0.5 tablets (12.5 mg total) by mouth 2 (two) times daily.  30 tablet  6  . morphine (MS CONTIN) 30 MG 12 hr tablet       . Nutritional Supplements (COMPLETE PROTEIN/VITAMIN SHAKE PO) Take 2-3 Bottles by mouth daily as needed (nutritional supplment).       . ondansetron (ZOFRAN) 8 MG tablet Take 1 tablet twice daily as needed  60 tablet  1  . oxyCODONE-acetaminophen (PERCOCET) 10-325 MG per tablet Take 1 tablet by mouth 5 (five) times daily as needed for pain.      . pantoprazole (PROTONIX) 40 MG tablet Take 1 tablet (40 mg total) by mouth 2 (two) times daily.  60 tablet  2  . promethazine (PHENERGAN) 25 MG tablet Take 1 tablet (25 mg total) by mouth at bedtime.  30 tablet  0  . topiramate (TOPAMAX) 25 MG tablet TAKE 4 TABLETS DAILY  120 tablet  1  . traMADol (ULTRAM) 50 MG tablet 1-2 every six hours as needed for headaches       No current facility-administered medications on file prior to visit.   No Known Allergies History   Social History  . Marital Status: Divorced    Spouse Name: N/A    Number of Children: 6  . Years of Education: N/A   Occupational History  . RETIRED    Social History Main Topics  . Smoking status: Current Every Day Smoker -- 0.50 packs/day for 60 years    Types: Cigarettes  . Smokeless tobacco: Never Used  . Alcohol Use: No     Comment: occasional  . Drug Use: No  . Sexual Activity: No   Other Topics Concern  . Not on file   Social History Narrative   4-6 cups of coffee daily        Review of Systems  All other systems reviewed and are negative.      Objective:   Physical Exam  Vitals reviewed. Cardiovascular: Normal rate, regular rhythm and normal heart sounds.   No murmur heard. Pulmonary/Chest: Effort normal and breath sounds normal. No  respiratory distress. She has no wheezes. She has no rales.  Abdominal: Soft. Bowel sounds are normal. She exhibits no distension. There is no tenderness. There is no rebound.  Musculoskeletal: She exhibits no edema.          Assessment & Plan:  GAD (generalized anxiety disorder) - Plan: mirtazapine (REMERON) 30 MG tablet  Loss of  weight  Left renal artery stenosis - Plan: Ambulatory referral to Cardiology   I will start the patient on Remeron 30 mg by mouth each bedtime for depression and anxiety. I am also hoping that this will help spark her appetite, curb her weight loss and possibly contribute to some weight gain. I am glad the patient discontinue Topamax. Temporarily increase her Xanax to 0.5 mg by mouth twice a day. I again strongly encouraged the patient to pursue smoking cessation. At the present time I do not believe the left renal artery stenosis is clinically significant. Her blood pressure is well controlled and her creatinine is stable. However I would obtain a second opinion is requested by the patient and her family. She sees Dr. Claiborne Billings. I will consult his partner Dr. Gwenlyn Found discussed treatment options Renal artery stenosis.  Recheck in one month and discuss depression and anxiety.  Also reassess her rate at that time. I stressed the importance for need to quit smoking and to take her Ensure TID.Marland Kitchen

## 2014-01-08 ENCOUNTER — Other Ambulatory Visit: Payer: Self-pay | Admitting: Family Medicine

## 2014-01-09 ENCOUNTER — Other Ambulatory Visit: Payer: Self-pay | Admitting: *Deleted

## 2014-01-09 MED ORDER — ATORVASTATIN CALCIUM 80 MG PO TABS: 80.0000 mg | ORAL_TABLET | Freq: Every day | ORAL | Status: DC

## 2014-01-09 NOTE — Telephone Encounter (Signed)
Rx refill sent to patient pharmacy   

## 2014-01-17 ENCOUNTER — Telehealth: Payer: Self-pay | Admitting: Family Medicine

## 2014-01-17 NOTE — Telephone Encounter (Signed)
Can she take this will all her other medications??

## 2014-01-17 NOTE — Telephone Encounter (Signed)
[facility] [address]  Motrin 800 mg she would like for [place] to call this in for her if possible

## 2014-01-20 NOTE — Telephone Encounter (Signed)
Pt aware to not take Motrin

## 2014-01-20 NOTE — Telephone Encounter (Signed)
I would NOT recommend this medicine due to her history of ulcers which this can cause and her taking plavix.

## 2014-01-31 ENCOUNTER — Encounter: Payer: Self-pay | Admitting: Family Medicine

## 2014-01-31 ENCOUNTER — Ambulatory Visit (INDEPENDENT_AMBULATORY_CARE_PROVIDER_SITE_OTHER): Payer: Commercial Managed Care - HMO | Admitting: Family Medicine

## 2014-01-31 VITALS — BP 130/78 | HR 62 | Temp 98.0°F | Resp 14 | Ht 60.0 in | Wt 91.0 lb

## 2014-01-31 DIAGNOSIS — R609 Edema, unspecified: Secondary | ICD-10-CM

## 2014-01-31 MED ORDER — FUROSEMIDE 20 MG PO TABS: 20.0000 mg | ORAL_TABLET | Freq: Every day | ORAL | Status: DC

## 2014-01-31 NOTE — Progress Notes (Signed)
Subjective:    Patient ID: Kathy Marshall, female    DOB: 01/14/1935, 79 y.o.   MRN: [id number]  HPI 01/06/14 Patient was recently seen by her gastroenterologist for abdominal pain. They performed a workup to evaluate for ischemic colitis. There was a question of a finding of 8 stenosis of the left renal artery. He is here today to discuss that. They would like to see a specialist regarding renal artery stenosis. However her blood pressure is well controlled at 136/74 and her creatinine has remained stable with no evidence of renal insufficiency. Unfortunately her anxiety is worsening.  She is unable to sleep at night. She is extremely tearful today on exam. She reports depression and Anhedonia.  She denies any suicidal ideation. She denies any hallucinations. However one Xanax a day is not helping control his anxiety. Also she continues to lose weight. They did discontinue topamax less than 1 month ago however she continues to lose weight. She is not taking Ensure every day as recommended.  At that time, my plan was:  I will start the patient on Remeron 30 mg by mouth each bedtime for depression and anxiety. I am also hoping that this will help spark her appetite, curb her weight loss and possibly contribute to some weight gain. I am glad the patient discontinue Topamax. Temporarily increase her Xanax to 0.5 mg by mouth twice a day. I again strongly encouraged the patient to pursue smoking cessation. At the present time I do not believe the left renal artery stenosis is clinically significant. Her blood pressure is well controlled and her creatinine is stable. However I would obtain a second opinion is requested by the patient and her family. She sees Dr. Claiborne Billings. I will consult his partner Dr. Gwenlyn Found discussed treatment options Renal artery stenosis.  Recheck in one month and discuss depression and anxiety.  Also reassess her rate at that time. I stressed the importance for need to quit smoking and to  take her Ensure TID..  01/31/14 Since I last saw patient, she has gained several pounds. Her appetite has improved. Unfortunately she is developed significant swelling in both feet. She has +2 pitting edema in both feet level just above her ankle. Then the pitting edema suddenly stops. There is no pitting edema in the legs above the knee. There is no evidence of anasarca around the body. There is no edema in the arms. There is no ascites in the abdomen. There is a 2% risk of edema on Remeron which I believe is very unlikely to be the cause of this. The patient denies a high sodium diet.  She denies chest pain shortness of breath and orthopnea. She denies oliguria. There is no evidence of jaundice with liver failure. She has no pain in her calves. She is a negative Homans sign. Past Medical History  Diagnosis Date  . Low back pain   . Bronchitis   . Chronic hoarseness   . Sleep apnea, obstructive   . Renal artery stenosis   . PVD (peripheral vascular disease)   . Hypercholesteremia   . Pain in limb   . COPD (chronic obstructive pulmonary disease)   . Benign essential hypertension   . Atherosclerosis     coronary vessel type  . Coronary artery disease   . Diverticulosis   . History of GI diverticular bleed   . History of esophagitis   . H. pylori infection   . Altered mental status   . Pneumonia   .  TIA (transient ischemic attack) 07/2010  . Arthritis   . GERD (gastroesophageal reflux disease)   . History of blood clots   . Tubular adenoma 2012  . Heart murmur   . CHF (congestive heart failure)   . Anginal pain   . Myocardial infarct 1990's    "I've only had one" (12/06/2012)  . Exertional shortness of breath   . Daily headache   . Anxiety   . Gastric ulcer 07/2012    large; required clipping Archie Endo 08/23/2012 (12/06/2012)   Past Surgical History  Procedure Laterality Date  . Oophorectomy    . Coronary angioplasty with stent placement  2006    Cypher stent to RCA. On cath in 2008  stent described as widely patent.   . Esophagogastroduodenoscopy N/A 08/21/2012    Procedure: ESOPHAGOGASTRODUODENOSCOPY (EGD);  Surgeon: Lafayette Dragon, MD;  Location: [facility] ENDOSCOPY;  Service: Endoscopy;  Laterality: N/A;  . Vaginal hysterectomy    . Esophagogastroduodenoscopy N/A 12/07/2012    Procedure: ESOPHAGOGASTRODUODENOSCOPY (EGD);  Surgeon: Gatha Mayer, MD;  Location: [facility] ENDOSCOPY;  Service: Endoscopy;  Laterality: N/A;   Current Outpatient Prescriptions on File Prior to Visit  Medication Sig Dispense Refill  . acetaminophen (TYLENOL) 325 MG tablet Take 650 mg by mouth every 6 (six) hours as needed.      Marland Kitchen albuterol (PROAIR HFA) 108 (90 BASE) MCG/ACT inhaler Inhale 2 puffs into the lungs every 4 (four) hours as needed for wheezing.  1 Inhaler  6  . ALPRAZolam (XANAX) 0.5 MG tablet TAKE 1 TABLET BY MOUTH EVERY DAY AS NEEDED  60 tablet  1  . atorvastatin (LIPITOR) 80 MG tablet Take 1 tablet (80 mg total) by mouth at bedtime.  30 tablet  2  . clopidogrel (PLAVIX) 75 MG tablet Take 1 tablet (75 mg total) by mouth daily.  90 tablet  1  . diphenoxylate-atropine (LOMOTIL) 2.5-0.025 MG per tablet Take 1 tablet by mouth 4 (four) times daily as needed for diarrhea or loose stools.  20 tablet  0  . donepezil (ARICEPT) 5 MG tablet Take 1 tablet (5 mg total) by mouth at bedtime.  30 tablet  11  . fluticasone (FLONASE) 50 MCG/ACT nasal spray PLACE 2 SPRAYS INTO BOTH NOSTRILS DAILY.  16 g  11  . fluticasone-salmeterol (ADVAIR HFA) 45-21 MCG/ACT inhaler Inhale 2 puffs into the lungs 2 (two) times daily as needed (wheezing and shortness of breath).  1 Inhaler  5  . gabapentin (NEURONTIN) 300 MG capsule TAKE 1 CAPSULE BY MOUTH DAILY AT BEDTIME  30 capsule  5  . isosorbide mononitrate (IMDUR) 60 MG 24 hr tablet Take 60 mg by mouth every evening.       Marland Kitchen losartan (COZAAR) 50 MG tablet       . metoCLOPramide (REGLAN) 5 MG tablet TAKE 1 TABLET BY MOUTH TWICE A DAY  60 tablet  1  . metoprolol tartrate  (LOPRESSOR) 25 MG tablet Take 0.5 tablets (12.5 mg total) by mouth 2 (two) times daily.  30 tablet  6  . mirtazapine (REMERON) 30 MG tablet Take 1 tablet (30 mg total) by mouth at bedtime.  30 tablet  5  . morphine (MS CONTIN) 30 MG 12 hr tablet       . Nutritional Supplements (COMPLETE PROTEIN/VITAMIN SHAKE PO) Take 2-3 Bottles by mouth daily as needed (nutritional supplment).       . ondansetron (ZOFRAN) 8 MG tablet Take 1 tablet twice daily as needed  60 tablet  1  .  oxyCODONE-acetaminophen (PERCOCET) 10-325 MG per tablet Take 1 tablet by mouth 5 (five) times daily as needed for pain.      . pantoprazole (PROTONIX) 40 MG tablet Take 1 tablet (40 mg total) by mouth 2 (two) times daily.  60 tablet  2  . promethazine (PHENERGAN) 25 MG tablet Take 1 tablet (25 mg total) by mouth at bedtime.  30 tablet  0  . topiramate (TOPAMAX) 25 MG tablet TAKE 4 TABLETS DAILY  120 tablet  1  . traMADol (ULTRAM) 50 MG tablet 1-2 every six hours as needed for headaches       No current facility-administered medications on file prior to visit.   No Known Allergies History   Social History  . Marital Status: Divorced    Spouse Name: N/A    Number of Children: 6  . Years of Education: N/A   Occupational History  . RETIRED    Social History Main Topics  . Smoking status: Current Every Day Smoker -- 0.50 packs/day for 60 years    Types: Cigarettes  . Smokeless tobacco: Never Used  . Alcohol Use: No     Comment: occasional  . Drug Use: No  . Sexual Activity: No   Other Topics Concern  . Not on file   Social History Narrative   4-6 cups of coffee daily        Review of Systems  All other systems reviewed and are negative.      Objective:   Physical Exam  Vitals reviewed. Cardiovascular: Normal rate, regular rhythm and normal heart sounds.   No murmur heard. Pulmonary/Chest: Effort normal and breath sounds normal. No respiratory distress. She has no wheezes. She has no rales.  Abdominal:  Soft. Bowel sounds are normal. She exhibits no distension. There is no tenderness. There is no rebound and no guarding.  Musculoskeletal: She exhibits edema.          Assessment & Plan:  Edema - Plan: furosemide (LASIX) 20 MG tablet, COMPLETE METABOLIC PANEL WITH GFR, CBC with Differential, Urinalysis, Routine w reflex microscopic   Start the patient on Lasix 20 mg by mouth daily. I will assess her renal function as well as hepatic function CMP. Also evaluate for hypoalbuminemia as a potential cause of swelling due to low oncotic pressure.  I will check a urinalysis to evaluate for proteinuria or signs of nephrotic syndrome. I do not believe this is congestive heart failure although if swelling persists into next week I recommended echocardiogram. I do not believe Mr. DVTs as the swelling is equal and symmetric bilaterally and stops just above her ankles which be unlikely to be a blood clot due to the lack of edema high in the leg and as the blood clot would have to be obstructing bilaterally.

## 2014-02-01 LAB — URINALYSIS, ROUTINE W REFLEX MICROSCOPIC
Bilirubin Urine: NEGATIVE
Glucose, UA: NEGATIVE mg/dL
HGB URINE DIPSTICK: NEGATIVE
Ketones, ur: NEGATIVE mg/dL
Leukocytes, UA: NEGATIVE
NITRITE: NEGATIVE
Protein, ur: NEGATIVE mg/dL
SPECIFIC GRAVITY, URINE: 1.018 (ref 1.005–1.030)
Urobilinogen, UA: 1 mg/dL (ref 0.0–1.0)
pH: 6 (ref 5.0–8.0)

## 2014-02-01 LAB — COMPLETE METABOLIC PANEL WITH GFR
ALK PHOS: 70 U/L (ref 39–117)
ALT: 14 U/L (ref 0–35)
AST: 21 U/L (ref 0–37)
Albumin: 3.7 g/dL (ref 3.5–5.2)
BUN: 10 mg/dL (ref 6–23)
CALCIUM: 8.9 mg/dL (ref 8.4–10.5)
CO2: 28 mEq/L (ref 19–32)
Chloride: 104 mEq/L (ref 96–112)
Creat: 0.94 mg/dL (ref 0.50–1.10)
GFR, EST AFRICAN AMERICAN: 68 mL/min
GFR, Est Non African American: 59 mL/min — ABNORMAL LOW
Glucose, Bld: 134 mg/dL — ABNORMAL HIGH (ref 70–99)
POTASSIUM: 4 meq/L (ref 3.5–5.3)
SODIUM: 142 meq/L (ref 135–145)
Total Bilirubin: 0.4 mg/dL (ref 0.2–1.2)
Total Protein: 6.2 g/dL (ref 6.0–8.3)

## 2014-02-01 LAB — CBC WITH DIFFERENTIAL/PLATELET
Basophils Absolute: 0.1 10*3/uL (ref 0.0–0.1)
Basophils Relative: 1 % (ref 0–1)
Eosinophils Absolute: 0.1 10*3/uL (ref 0.0–0.7)
Eosinophils Relative: 2 % (ref 0–5)
HCT: 41.9 % (ref 36.0–46.0)
HEMOGLOBIN: 14.3 g/dL (ref 12.0–15.0)
LYMPHS ABS: 1.7 10*3/uL (ref 0.7–4.0)
Lymphocytes Relative: 29 % (ref 12–46)
MCH: 32 pg (ref 26.0–34.0)
MCHC: 34.1 g/dL (ref 30.0–36.0)
MCV: 93.7 fL (ref 78.0–100.0)
Monocytes Absolute: 0.5 10*3/uL (ref 0.1–1.0)
Monocytes Relative: 9 % (ref 3–12)
NEUTROS PCT: 59 % (ref 43–77)
Neutro Abs: 3.4 10*3/uL (ref 1.7–7.7)
PLATELETS: 244 10*3/uL (ref 150–400)
RBC: 4.47 MIL/uL (ref 3.87–5.11)
RDW: 13.7 % (ref 11.5–15.5)
WBC: 5.7 10*3/uL (ref 4.0–10.5)

## 2014-02-05 ENCOUNTER — Other Ambulatory Visit: Payer: Self-pay | Admitting: *Deleted

## 2014-02-05 MED ORDER — METOCLOPRAMIDE HCL 5 MG PO TABS: ORAL_TABLET | ORAL | Status: DC

## 2014-02-06 ENCOUNTER — Other Ambulatory Visit: Payer: Self-pay | Admitting: Internal Medicine

## 2014-02-06 ENCOUNTER — Ambulatory Visit: Payer: 59 | Admitting: Family Medicine

## 2014-02-14 ENCOUNTER — Other Ambulatory Visit: Payer: Self-pay | Admitting: *Deleted

## 2014-02-14 MED ORDER — ISOSORBIDE MONONITRATE ER 60 MG PO TB24: 60.0000 mg | ORAL_TABLET | Freq: Every evening | ORAL | Status: DC

## 2014-02-14 NOTE — Telephone Encounter (Signed)
Rx was sent to pharmacy electronically. 

## 2014-02-17 ENCOUNTER — Other Ambulatory Visit: Payer: Self-pay | Admitting: *Deleted

## 2014-02-17 MED ORDER — ISOSORBIDE MONONITRATE ER 60 MG PO TB24: 60.0000 mg | ORAL_TABLET | Freq: Every evening | ORAL | Status: DC

## 2014-03-04 ENCOUNTER — Ambulatory Visit: Payer: Medicare PPO | Admitting: Cardiovascular Disease

## 2014-03-17 ENCOUNTER — Other Ambulatory Visit: Payer: Self-pay | Admitting: Family Medicine

## 2014-03-17 NOTE — Telephone Encounter (Signed)
Last RF 7/13 #60 +1.  Ok refill?

## 2014-03-18 NOTE — Telephone Encounter (Signed)
RX faxed to pharmacy.

## 2014-04-03 ENCOUNTER — Other Ambulatory Visit: Payer: Self-pay | Admitting: Internal Medicine

## 2014-04-03 ENCOUNTER — Other Ambulatory Visit: Payer: Self-pay | Admitting: Cardiovascular Disease

## 2014-04-04 ENCOUNTER — Ambulatory Visit: Payer: Commercial Managed Care - HMO | Admitting: Cardiovascular Disease

## 2014-04-06 ENCOUNTER — Other Ambulatory Visit: Payer: Self-pay | Admitting: Internal Medicine

## 2014-04-07 NOTE — Telephone Encounter (Signed)
Rx was sent to pharmacy electronically. 

## 2014-04-10 ENCOUNTER — Telehealth: Payer: Self-pay | Admitting: Cardiovascular Disease

## 2014-04-10 NOTE — Telephone Encounter (Signed)
Closed encounter °

## 2014-04-11 ENCOUNTER — Ambulatory Visit (INDEPENDENT_AMBULATORY_CARE_PROVIDER_SITE_OTHER): Payer: Commercial Managed Care - HMO | Admitting: Family Medicine

## 2014-04-11 ENCOUNTER — Encounter: Payer: Self-pay | Admitting: Family Medicine

## 2014-04-11 ENCOUNTER — Telehealth: Payer: Self-pay | Admitting: Family Medicine

## 2014-04-11 ENCOUNTER — Other Ambulatory Visit: Payer: Self-pay | Admitting: Cardiovascular Disease

## 2014-04-11 VITALS — BP 128/74 | HR 72 | Temp 98.0°F | Resp 16 | Ht 62.0 in | Wt 86.0 lb

## 2014-04-11 DIAGNOSIS — J441 Chronic obstructive pulmonary disease with (acute) exacerbation: Secondary | ICD-10-CM

## 2014-04-11 MED ORDER — PREDNISONE 20 MG PO TABS: ORAL_TABLET | ORAL | Status: DC

## 2014-04-11 MED ORDER — LEVOFLOXACIN 500 MG PO TABS: 500.0000 mg | ORAL_TABLET | Freq: Every day | ORAL | Status: DC

## 2014-04-11 NOTE — Telephone Encounter (Signed)
Spoke to [employer] and updated pt med list. She is taking the Remeron and was asking about Megace and wondering if it would a good idea for her to take because of the potential of a blood clots. (Not taking Plavix)  She is requesting a refill on Tramadol for Prn HA's and Phenergan for Prn HA's  - ? OK to Refill these medications

## 2014-04-11 NOTE — Telephone Encounter (Signed)
[phone]  Pt grand daughter is calling back about the medications she was taking and what she wasn't taking

## 2014-04-11 NOTE — Progress Notes (Signed)
Subjective:    Patient ID: Kathy Marshall, female    DOB: 1934-09-28, 79 y.o.   MRN: [id number]  HPI Since I last saw the patient, she has moved out of the home in which she was staying and she is now staying with the family member. She has lost 5 pounds since that time over the last 2 months. In the last 2 weeks she reports a cough that is gradually worsening. It is nonproductive. She reports increasing wheezing and increasing shortness of breath. She is taking albuterol every 4-6 hours with minimal benefit. She denies any chest pain. She reports occasional fevers. She reports symptoms of a viral upper respiratory infection including rhinorrhea. She has had similar symptoms in the past which rapidly progressed to pneumonia and COPD exacerbation requiring ICU. Past Medical History  Diagnosis Date  . Low back pain   . Bronchitis   . Chronic hoarseness   . Sleep apnea, obstructive   . Renal artery stenosis   . PVD (peripheral vascular disease)   . Hypercholesteremia   . Pain in limb   . COPD (chronic obstructive pulmonary disease)   . Benign essential hypertension   . Atherosclerosis     coronary vessel type  . Coronary artery disease   . Diverticulosis   . History of GI diverticular bleed   . History of esophagitis   . H. pylori infection   . Altered mental status   . Pneumonia   . TIA (transient ischemic attack) 07/2010  . Arthritis   . GERD (gastroesophageal reflux disease)   . History of blood clots   . Tubular adenoma 2012  . Heart murmur   . CHF (congestive heart failure)   . Anginal pain   . Myocardial infarct 1990's    "I've only had one" (12/06/2012)  . Exertional shortness of breath   . Daily headache   . Anxiety   . Gastric ulcer 07/2012    large; required clipping Archie Endo 08/23/2012 (12/06/2012)   Past Surgical History  Procedure Laterality Date  . Oophorectomy    . Coronary angioplasty with stent placement  2006    Cypher stent to RCA. On cath in 2008 stent  described as widely patent.   . Esophagogastroduodenoscopy N/A 08/21/2012    Procedure: ESOPHAGOGASTRODUODENOSCOPY (EGD);  Surgeon: Lafayette Dragon, MD;  Location: [facility] ENDOSCOPY;  Service: Endoscopy;  Laterality: N/A;  . Vaginal hysterectomy    . Esophagogastroduodenoscopy N/A 12/07/2012    Procedure: ESOPHAGOGASTRODUODENOSCOPY (EGD);  Surgeon: Gatha Mayer, MD;  Location: [facility] ENDOSCOPY;  Service: Endoscopy;  Laterality: N/A;   Current Outpatient Prescriptions on File Prior to Visit  Medication Sig Dispense Refill  . acetaminophen (TYLENOL) 325 MG tablet Take 650 mg by mouth every 6 (six) hours as needed.      Marland Kitchen albuterol (PROAIR HFA) 108 (90 BASE) MCG/ACT inhaler Inhale 2 puffs into the lungs every 4 (four) hours as needed for wheezing.  1 Inhaler  6  . ALPRAZolam (XANAX) 0.5 MG tablet TAKE 1 TABLET BY MOUTH TWICE A DAY AS NEEDED  60 tablet  1  . atorvastatin (LIPITOR) 80 MG tablet Take 1 tablet (80 mg total) by mouth daily. <please make and keep appointment with Dr. Claiborne Billings for refills>  30 tablet  0  . clopidogrel (PLAVIX) 75 MG tablet Take 1 tablet (75 mg total) by mouth daily.  90 tablet  1  . diphenoxylate-atropine (LOMOTIL) 2.5-0.025 MG per tablet Take 1 tablet by mouth 4 (four) times daily as  needed for diarrhea or loose stools.  20 tablet  0  . donepezil (ARICEPT) 5 MG tablet Take 1 tablet (5 mg total) by mouth at bedtime.  30 tablet  11  . fluticasone (FLONASE) 50 MCG/ACT nasal spray PLACE 2 SPRAYS INTO BOTH NOSTRILS DAILY.  16 g  11  . fluticasone-salmeterol (ADVAIR HFA) 45-21 MCG/ACT inhaler Inhale 2 puffs into the lungs 2 (two) times daily as needed (wheezing and shortness of breath).  1 Inhaler  5  . furosemide (LASIX) 20 MG tablet Take 1 tablet (20 mg total) by mouth daily.  30 tablet  3  . gabapentin (NEURONTIN) 300 MG capsule TAKE 1 CAPSULE BY MOUTH DAILY AT BEDTIME  30 capsule  5  . isosorbide mononitrate (IMDUR) 60 MG 24 hr tablet Take 1 tablet (60 mg total) by mouth every evening.   30 tablet  2  . losartan (COZAAR) 50 MG tablet       . metoCLOPramide (REGLAN) 5 MG tablet TAKE 1 TABLET BY MOUTH TWICE A DAY  60 tablet  1  . metoprolol tartrate (LOPRESSOR) 25 MG tablet Take 0.5 tablets (12.5 mg total) by mouth 2 (two) times daily.  30 tablet  6  . mirtazapine (REMERON) 30 MG tablet Take 1 tablet (30 mg total) by mouth at bedtime.  30 tablet  5  . morphine (MS CONTIN) 30 MG 12 hr tablet       . Nutritional Supplements (COMPLETE PROTEIN/VITAMIN SHAKE PO) Take 2-3 Bottles by mouth daily as needed (nutritional supplment).       . ondansetron (ZOFRAN) 8 MG tablet TAKE 1 TABLET TWICE DAILY AS NEEDED  60 tablet  1  . oxyCODONE-acetaminophen (PERCOCET) 10-325 MG per tablet Take 1 tablet by mouth 5 (five) times daily as needed for pain.      . pantoprazole (PROTONIX) 40 MG tablet TAKE 1 TABLET (40 MG TOTAL) BY MOUTH 2 (TWO) TIMES DAILY.  60 tablet  2  . promethazine (PHENERGAN) 25 MG tablet Take 1 tablet (25 mg total) by mouth at bedtime.  30 tablet  0  . traMADol (ULTRAM) 50 MG tablet 1-2 every six hours as needed for headaches       No current facility-administered medications on file prior to visit.  No Known Allergies History   Social History  . Marital Status: Divorced    Spouse Name: N/A    Number of Children: 6  . Years of Education: N/A   Occupational History  . RETIRED    Social History Main Topics  . Smoking status: Current Every Day Smoker -- 0.50 packs/day for 60 years    Types: Cigarettes  . Smokeless tobacco: Never Used  . Alcohol Use: No     Comment: occasional  . Drug Use: No  . Sexual Activity: No   Other Topics Concern  . Not on file   Social History Narrative   4-6 cups of coffee daily       Review of Systems  All other systems reviewed and are negative.      Objective:   Physical Exam  Vitals reviewed. Constitutional: She appears well-developed and well-nourished.  HENT:  Right Ear: External ear normal.  Left Ear: External ear  normal.  Nose: Nose normal.  Mouth/Throat: Oropharynx is clear and moist. No oropharyngeal exudate.  Neck: Neck supple. No JVD present.  Cardiovascular: Normal rate, regular rhythm and normal heart sounds.   No murmur heard. Pulmonary/Chest: Effort normal. No respiratory distress. She has wheezes. She has  rales.  Abdominal: Soft. Bowel sounds are normal.  Lymphadenopathy:    She has no cervical adenopathy.          Assessment & Plan:  COPD exacerbation - Plan: levofloxacin (LEVAQUIN) 500 MG tablet, predniSONE (DELTASONE) 20 MG tablet  I am concerned the patient is developing a COPD exacerbation. Begin Levaquin 500 mg by mouth daily for 7 days. I started the patient also on a prednisone taper pack for 6 days. She can use albuterol every 6 hours as needed. Patient thinks she may have stopped Remeron which may explain her weight loss. She is going to go home and verify whether or not she is taking that medication. If she is, I would add Megace as an appetite stimulant.

## 2014-04-11 NOTE — Telephone Encounter (Signed)
Rx was sent to pharmacy electronically. 

## 2014-04-14 ENCOUNTER — Other Ambulatory Visit: Payer: Self-pay | Admitting: Cardiovascular Disease

## 2014-04-14 NOTE — Telephone Encounter (Signed)
Ok to refill both.  Stop topamax and begin megace 400 mg poqday for appetite.

## 2014-04-14 NOTE — Telephone Encounter (Signed)
Isosorbide refilled #30 tablets with 2 refills on 02/17/2014 (should last until appointment on 05/20/14)

## 2014-04-14 NOTE — Telephone Encounter (Signed)
LMTRC

## 2014-04-15 NOTE — Telephone Encounter (Signed)
Kathy Marshall states that she would like for her to try something that does not have the chance of her developing clots. They offered her something else in the hospital about 2 years ago and she does not remember the name of it but was wondering if you knew of anything else? (she will try to find out from her aunt and call us back)

## 2014-04-15 NOTE — Telephone Encounter (Signed)
There is nothing else.  Marinol is not covered by insurance.

## 2014-04-16 NOTE — Telephone Encounter (Signed)
Kathy Marshall aware per vm and informed her that she could call insurance to see if a PA could be submitted to cover this medication and she will call me back.

## 2014-04-22 ENCOUNTER — Telehealth: Payer: Self-pay | Admitting: Family Medicine

## 2014-04-22 MED ORDER — DRONABINOL 2.5 MG PO CAPS: 2.5000 mg | ORAL_CAPSULE | Freq: Every day | ORAL | Status: DC

## 2014-04-22 MED ORDER — PROMETHAZINE HCL 25 MG PO TABS: 25.0000 mg | ORAL_TABLET | Freq: Every day | ORAL | Status: DC

## 2014-04-22 NOTE — Telephone Encounter (Signed)
Spoke to daughter - see previous phone note

## 2014-04-22 NOTE — Telephone Encounter (Signed)
2.5 mg poqday of marinol

## 2014-04-22 NOTE — Telephone Encounter (Signed)
Marinol called to pharm and phenergan rx sent

## 2014-04-22 NOTE — Telephone Encounter (Signed)
Patient's granddaughter nicole is calling to speak to you regarding Mindie's medications again would like for you to call her back at [phone]

## 2014-04-22 NOTE — Telephone Encounter (Signed)
Pt's granddaughter would like for [place] to send in a rx for the Marinol just to see if insurance will cover it and if not how expensive it is. Can you give me a mg for it? (Also needs new rx for phenergan - ok to refill?)

## 2014-04-27 ENCOUNTER — Other Ambulatory Visit: Payer: Self-pay | Admitting: Cardiovascular Disease

## 2014-04-27 ENCOUNTER — Other Ambulatory Visit: Payer: Self-pay | Admitting: Internal Medicine

## 2014-05-01 ENCOUNTER — Inpatient Hospital Stay (HOSPITAL_COMMUNITY)
Admission: EM | Admit: 2014-05-01 | Discharge: 2014-05-06 | DRG: 190 | Disposition: A | Discharge status: Home Health | Payer: Medicare HMO | Attending: Internal Medicine | Admitting: Internal Medicine

## 2014-05-01 ENCOUNTER — Encounter (HOSPITAL_COMMUNITY): Payer: Self-pay | Admitting: Cardiology

## 2014-05-01 ENCOUNTER — Emergency Department (HOSPITAL_COMMUNITY): Payer: Medicare HMO

## 2014-05-01 DIAGNOSIS — R0600 Dyspnea, unspecified: Secondary | ICD-10-CM

## 2014-05-01 DIAGNOSIS — E876 Hypokalemia: Secondary | ICD-10-CM

## 2014-05-01 DIAGNOSIS — A310 Pulmonary mycobacterial infection: Secondary | ICD-10-CM | POA: Diagnosis present

## 2014-05-01 DIAGNOSIS — Z823 Family history of stroke: Secondary | ICD-10-CM | POA: Diagnosis not present

## 2014-05-01 DIAGNOSIS — Z681 Body mass index (BMI) 19 or less, adult: Secondary | ICD-10-CM | POA: Diagnosis not present

## 2014-05-01 DIAGNOSIS — Z8673 Personal history of transient ischemic attack (TIA), and cerebral infarction without residual deficits: Secondary | ICD-10-CM | POA: Diagnosis not present

## 2014-05-01 DIAGNOSIS — Z8619 Personal history of other infectious and parasitic diseases: Secondary | ICD-10-CM | POA: Diagnosis not present

## 2014-05-01 DIAGNOSIS — Z800 Family history of malignant neoplasm of digestive organs: Secondary | ICD-10-CM

## 2014-05-01 DIAGNOSIS — J441 Chronic obstructive pulmonary disease with (acute) exacerbation: Secondary | ICD-10-CM | POA: Diagnosis not present

## 2014-05-01 DIAGNOSIS — R0902 Hypoxemia: Secondary | ICD-10-CM | POA: Diagnosis present

## 2014-05-01 DIAGNOSIS — E860 Dehydration: Secondary | ICD-10-CM | POA: Diagnosis present

## 2014-05-01 DIAGNOSIS — J471 Bronchiectasis with (acute) exacerbation: Secondary | ICD-10-CM | POA: Diagnosis present

## 2014-05-01 DIAGNOSIS — I252 Old myocardial infarction: Secondary | ICD-10-CM

## 2014-05-01 DIAGNOSIS — Z7952 Long term (current) use of systemic steroids: Secondary | ICD-10-CM

## 2014-05-01 DIAGNOSIS — K529 Noninfective gastroenteritis and colitis, unspecified: Secondary | ICD-10-CM | POA: Diagnosis present

## 2014-05-01 DIAGNOSIS — R9431 Abnormal electrocardiogram [ECG] [EKG]: Secondary | ICD-10-CM

## 2014-05-01 DIAGNOSIS — I509 Heart failure, unspecified: Secondary | ICD-10-CM | POA: Diagnosis present

## 2014-05-01 DIAGNOSIS — I2510 Atherosclerotic heart disease of native coronary artery without angina pectoris: Secondary | ICD-10-CM | POA: Diagnosis present

## 2014-05-01 DIAGNOSIS — M1990 Unspecified osteoarthritis, unspecified site: Secondary | ICD-10-CM | POA: Diagnosis present

## 2014-05-01 DIAGNOSIS — F17210 Nicotine dependence, cigarettes, uncomplicated: Secondary | ICD-10-CM | POA: Diagnosis present

## 2014-05-01 DIAGNOSIS — R112 Nausea with vomiting, unspecified: Secondary | ICD-10-CM | POA: Diagnosis present

## 2014-05-01 DIAGNOSIS — J189 Pneumonia, unspecified organism: Secondary | ICD-10-CM | POA: Diagnosis present

## 2014-05-01 DIAGNOSIS — K639 Disease of intestine, unspecified: Secondary | ICD-10-CM

## 2014-05-01 DIAGNOSIS — I739 Peripheral vascular disease, unspecified: Secondary | ICD-10-CM | POA: Diagnosis present

## 2014-05-01 DIAGNOSIS — R609 Edema, unspecified: Secondary | ICD-10-CM

## 2014-05-01 DIAGNOSIS — Z955 Presence of coronary angioplasty implant and graft: Secondary | ICD-10-CM

## 2014-05-01 DIAGNOSIS — Z8249 Family history of ischemic heart disease and other diseases of the circulatory system: Secondary | ICD-10-CM

## 2014-05-01 DIAGNOSIS — Z720 Tobacco use: Secondary | ICD-10-CM | POA: Diagnosis present

## 2014-05-01 DIAGNOSIS — Z79891 Long term (current) use of opiate analgesic: Secondary | ICD-10-CM

## 2014-05-01 DIAGNOSIS — R634 Abnormal weight loss: Secondary | ICD-10-CM | POA: Diagnosis present

## 2014-05-01 DIAGNOSIS — I10 Essential (primary) hypertension: Secondary | ICD-10-CM | POA: Diagnosis present

## 2014-05-01 DIAGNOSIS — Z801 Family history of malignant neoplasm of trachea, bronchus and lung: Secondary | ICD-10-CM

## 2014-05-01 DIAGNOSIS — Z79899 Other long term (current) drug therapy: Secondary | ICD-10-CM

## 2014-05-01 DIAGNOSIS — E780 Pure hypercholesterolemia: Secondary | ICD-10-CM | POA: Diagnosis present

## 2014-05-01 DIAGNOSIS — J440 Chronic obstructive pulmonary disease with acute lower respiratory infection: Secondary | ICD-10-CM | POA: Diagnosis present

## 2014-05-01 DIAGNOSIS — R6251 Failure to thrive (child): Secondary | ICD-10-CM | POA: Diagnosis present

## 2014-05-01 DIAGNOSIS — R627 Adult failure to thrive: Secondary | ICD-10-CM | POA: Diagnosis present

## 2014-05-01 DIAGNOSIS — G4733 Obstructive sleep apnea (adult) (pediatric): Secondary | ICD-10-CM | POA: Diagnosis present

## 2014-05-01 DIAGNOSIS — K219 Gastro-esophageal reflux disease without esophagitis: Secondary | ICD-10-CM | POA: Diagnosis present

## 2014-05-01 DIAGNOSIS — E43 Unspecified severe protein-calorie malnutrition: Secondary | ICD-10-CM | POA: Diagnosis present

## 2014-05-01 DIAGNOSIS — R0602 Shortness of breath: Secondary | ICD-10-CM | POA: Diagnosis not present

## 2014-05-01 LAB — I-STAT TROPONIN, ED: Troponin i, poc: 0.01 ng/mL (ref 0.00–0.08)

## 2014-05-01 LAB — COMPREHENSIVE METABOLIC PANEL
ALBUMIN: 3.8 g/dL (ref 3.5–5.2)
ALT: 22 U/L (ref 0–35)
AST: 23 U/L (ref 0–37)
Alkaline Phosphatase: 97 U/L (ref 39–117)
Anion gap: 13 (ref 5–15)
BUN: 25 mg/dL — ABNORMAL HIGH (ref 6–23)
CALCIUM: 9.3 mg/dL (ref 8.4–10.5)
CO2: 30 meq/L (ref 19–32)
Chloride: 103 mEq/L (ref 96–112)
Creatinine, Ser: 0.7 mg/dL (ref 0.50–1.10)
GFR calc Af Amer: >90 mL/min (ref >90)
GFR, EST NON AFRICAN AMERICAN: 81 mL/min — AB (ref >90)
Glucose, Bld: 109 mg/dL — ABNORMAL HIGH (ref 70–99)
Potassium: 2.8 mEq/L — CL (ref 3.7–5.3)
SODIUM: 146 meq/L (ref 137–147)
Total Bilirubin: 0.6 mg/dL (ref 0.3–1.2)
Total Protein: 7.7 g/dL (ref 6.0–8.3)

## 2014-05-01 LAB — CBC WITH DIFFERENTIAL/PLATELET
BASOS PCT: 0 % (ref 0–1)
Basophils Absolute: 0 10*3/uL (ref 0.0–0.1)
Eosinophils Absolute: 0 10*3/uL (ref 0.0–0.7)
Eosinophils Relative: 0 % (ref 0–5)
HCT: 45 % (ref 36.0–46.0)
Hemoglobin: 15.3 g/dL — ABNORMAL HIGH (ref 12.0–15.0)
LYMPHS PCT: 21 % (ref 12–46)
Lymphs Abs: 1.4 10*3/uL (ref 0.7–4.0)
MCH: 31.9 pg (ref 26.0–34.0)
MCHC: 34 g/dL (ref 30.0–36.0)
MCV: 93.9 fL (ref 78.0–100.0)
Monocytes Absolute: 0.5 10*3/uL (ref 0.1–1.0)
Monocytes Relative: 8 % (ref 3–12)
NEUTROS ABS: 4.5 10*3/uL (ref 1.7–7.7)
Neutrophils Relative %: 71 % (ref 43–77)
PLATELETS: 198 10*3/uL (ref 150–400)
RBC: 4.79 MIL/uL (ref 3.87–5.11)
RDW: 13.7 % (ref 11.5–15.5)
WBC: 6.4 10*3/uL (ref 4.0–10.5)

## 2014-05-01 LAB — URINALYSIS, ROUTINE W REFLEX MICROSCOPIC
Bilirubin Urine: NEGATIVE
Glucose, UA: NEGATIVE mg/dL
Hgb urine dipstick: NEGATIVE
Ketones, ur: NEGATIVE mg/dL
Leukocytes, UA: NEGATIVE
NITRITE: NEGATIVE
Protein, ur: NEGATIVE mg/dL
SPECIFIC GRAVITY, URINE: 1.01 (ref 1.005–1.030)
UROBILINOGEN UA: 1 mg/dL (ref 0.0–1.0)
pH: 6.5 (ref 5.0–8.0)

## 2014-05-01 MED ORDER — ATORVASTATIN CALCIUM 40 MG PO TABS
80.0000 mg | ORAL_TABLET | Freq: Every day | ORAL | Status: DC
  Administered 2014-05-01 – 2014-05-05 (×5): 80 mg via ORAL
  Filled 2014-05-01 (×5): qty 2

## 2014-05-01 MED ORDER — LOSARTAN POTASSIUM 50 MG PO TABS
50.0000 mg | ORAL_TABLET | Freq: Every day | ORAL | Status: DC
Start: 2014-05-02 — End: 2014-05-06
  Administered 2014-05-02 – 2014-05-06 (×5): 50 mg via ORAL
  Filled 2014-05-01 (×5): qty 1

## 2014-05-01 MED ORDER — MORPHINE SULFATE ER 30 MG PO TBCR
30.0000 mg | EXTENDED_RELEASE_TABLET | Freq: Every day | ORAL | Status: DC
Start: 2014-05-01 — End: 2014-05-06
  Administered 2014-05-01 – 2014-05-05 (×5): 30 mg via ORAL
  Filled 2014-05-01 (×5): qty 1

## 2014-05-01 MED ORDER — OXYCODONE-ACETAMINOPHEN 5-325 MG PO TABS
1.0000 | ORAL_TABLET | Freq: Every day | ORAL | Status: DC | PRN
  Administered 2014-05-02 – 2014-05-06 (×12): 1 via ORAL
  Filled 2014-05-01 (×13): qty 1

## 2014-05-01 MED ORDER — OXYCODONE-ACETAMINOPHEN 10-325 MG PO TABS: 1.0000 | ORAL_TABLET | Freq: Every day | ORAL | Status: DC | PRN

## 2014-05-01 MED ORDER — DRONABINOL 2.5 MG PO CAPS
2.5000 mg | ORAL_CAPSULE | Freq: Every day | ORAL | Status: DC
  Administered 2014-05-01 – 2014-05-06 (×6): 2.5 mg via ORAL
  Filled 2014-05-01 (×5): qty 1

## 2014-05-01 MED ORDER — MIRTAZAPINE 30 MG PO TABS
30.0000 mg | ORAL_TABLET | Freq: Every day | ORAL | Status: DC
  Administered 2014-05-02 – 2014-05-05 (×4): 30 mg via ORAL
  Filled 2014-05-01 (×5): qty 1

## 2014-05-01 MED ORDER — PREDNISONE 50 MG PO TABS
60.0000 mg | ORAL_TABLET | Freq: Once | ORAL | Status: AC
  Administered 2014-05-01: 60 mg via ORAL
  Filled 2014-05-01 (×2): qty 1

## 2014-05-01 MED ORDER — ALBUTEROL SULFATE (2.5 MG/3ML) 0.083% IN NEBU
5.0000 mg | INHALATION_SOLUTION | Freq: Once | RESPIRATORY_TRACT | Status: AC
  Administered 2014-05-01: 5 mg via RESPIRATORY_TRACT
  Filled 2014-05-01: qty 6

## 2014-05-01 MED ORDER — FUROSEMIDE 40 MG PO TABS: 20.0000 mg | ORAL_TABLET | Freq: Every day | ORAL | Status: DC

## 2014-05-01 MED ORDER — POTASSIUM CHLORIDE IN NACL 40-0.9 MEQ/L-% IV SOLN
INTRAVENOUS | Status: DC
Start: 2014-05-01 — End: 2014-05-02
  Administered 2014-05-01 – 2014-05-02 (×2): 100 mL/h via INTRAVENOUS

## 2014-05-01 MED ORDER — IPRATROPIUM-ALBUTEROL 0.5-2.5 (3) MG/3ML IN SOLN
3.0000 mL | Freq: Once | RESPIRATORY_TRACT | Status: AC
  Administered 2014-05-01: 3 mL via RESPIRATORY_TRACT
  Filled 2014-05-01: qty 3

## 2014-05-01 MED ORDER — MOMETASONE FURO-FORMOTEROL FUM 100-5 MCG/ACT IN AERO
INHALATION_SPRAY | RESPIRATORY_TRACT | Status: AC
  Filled 2014-05-01: qty 8.8

## 2014-05-01 MED ORDER — GABAPENTIN 300 MG PO CAPS
300.0000 mg | ORAL_CAPSULE | Freq: Every day | ORAL | Status: DC
  Administered 2014-05-01 – 2014-05-05 (×5): 300 mg via ORAL
  Filled 2014-05-01 (×5): qty 1

## 2014-05-01 MED ORDER — HYDROMORPHONE HCL 2 MG PO TABS: 2.0000 mg | ORAL_TABLET | Freq: Four times a day (QID) | ORAL | Status: DC | PRN

## 2014-05-01 MED ORDER — SODIUM CHLORIDE 0.9 % IJ SOLN
3.0000 mL | Freq: Two times a day (BID) | INTRAMUSCULAR | Status: DC
  Administered 2014-05-02 – 2014-05-06 (×6): 3 mL via INTRAVENOUS

## 2014-05-01 MED ORDER — DONEPEZIL HCL 5 MG PO TABS
5.0000 mg | ORAL_TABLET | Freq: Every day | ORAL | Status: DC
  Administered 2014-05-01 – 2014-05-05 (×5): 5 mg via ORAL
  Filled 2014-05-01 (×5): qty 1

## 2014-05-01 MED ORDER — POTASSIUM CHLORIDE 10 MEQ/100ML IV SOLN
10.0000 meq | Freq: Once | INTRAVENOUS | Status: AC
  Administered 2014-05-01: 10 meq via INTRAVENOUS
  Filled 2014-05-01: qty 100

## 2014-05-01 MED ORDER — ISOSORBIDE MONONITRATE ER 60 MG PO TB24
60.0000 mg | ORAL_TABLET | Freq: Every day | ORAL | Status: DC
  Administered 2014-05-01 – 2014-05-06 (×6): 60 mg via ORAL
  Filled 2014-05-01 (×6): qty 1

## 2014-05-01 MED ORDER — ALPRAZOLAM 0.5 MG PO TABS
0.5000 mg | ORAL_TABLET | Freq: Every evening | ORAL | Status: DC | PRN
  Administered 2014-05-01 – 2014-05-05 (×5): 0.5 mg via ORAL
  Filled 2014-05-01 (×5): qty 1

## 2014-05-01 MED ORDER — ACETAMINOPHEN 325 MG PO TABS
650.0000 mg | ORAL_TABLET | Freq: Four times a day (QID) | ORAL | Status: DC | PRN
  Administered 2014-05-02: 650 mg via ORAL
  Filled 2014-05-01: qty 2

## 2014-05-01 MED ORDER — ONDANSETRON HCL 4 MG PO TABS: 4.0000 mg | ORAL_TABLET | Freq: Four times a day (QID) | ORAL | Status: DC | PRN

## 2014-05-01 MED ORDER — PROMETHAZINE HCL 12.5 MG PO TABS: 25.0000 mg | ORAL_TABLET | Freq: Every day | ORAL | Status: DC | PRN

## 2014-05-01 MED ORDER — MOMETASONE FURO-FORMOTEROL FUM 100-5 MCG/ACT IN AERO
2.0000 | INHALATION_SPRAY | Freq: Two times a day (BID) | RESPIRATORY_TRACT | Status: DC
  Administered 2014-05-01 – 2014-05-06 (×10): 2 via RESPIRATORY_TRACT
  Filled 2014-05-01: qty 8.8

## 2014-05-01 MED ORDER — ONDANSETRON HCL 4 MG/2ML IJ SOLN
4.0000 mg | Freq: Four times a day (QID) | INTRAMUSCULAR | Status: DC | PRN
Start: 2014-05-01 — End: 2014-05-06

## 2014-05-01 MED ORDER — PANTOPRAZOLE SODIUM 40 MG PO TBEC
40.0000 mg | DELAYED_RELEASE_TABLET | Freq: Every day | ORAL | Status: DC
  Administered 2014-05-02 – 2014-05-06 (×5): 40 mg via ORAL
  Filled 2014-05-01 (×5): qty 1

## 2014-05-01 MED ORDER — METOPROLOL TARTRATE 25 MG PO TABS
12.5000 mg | ORAL_TABLET | Freq: Two times a day (BID) | ORAL | Status: DC
  Administered 2014-05-01 – 2014-05-02 (×2): 12.5 mg via ORAL
  Filled 2014-05-01 (×2): qty 1

## 2014-05-01 MED ORDER — OXYCODONE HCL 5 MG PO TABS
5.0000 mg | ORAL_TABLET | Freq: Every day | ORAL | Status: DC | PRN
  Administered 2014-05-02 – 2014-05-06 (×11): 5 mg via ORAL
  Filled 2014-05-01 (×12): qty 1

## 2014-05-01 MED ORDER — POTASSIUM CHLORIDE CRYS ER 20 MEQ PO TBCR
40.0000 meq | EXTENDED_RELEASE_TABLET | Freq: Once | ORAL | Status: AC
  Administered 2014-05-01: 40 meq via ORAL
  Filled 2014-05-01: qty 2

## 2014-05-01 MED ORDER — METOCLOPRAMIDE HCL 10 MG PO TABS
5.0000 mg | ORAL_TABLET | Freq: Three times a day (TID) | ORAL | Status: DC
  Administered 2014-05-02 – 2014-05-06 (×14): 5 mg via ORAL
  Filled 2014-05-01 (×14): qty 1

## 2014-05-01 MED ORDER — ALBUTEROL SULFATE (2.5 MG/3ML) 0.083% IN NEBU
3.0000 mL | INHALATION_SOLUTION | RESPIRATORY_TRACT | Status: DC | PRN
  Administered 2014-05-02: 3 mL via RESPIRATORY_TRACT
  Filled 2014-05-01: qty 3

## 2014-05-01 MED ORDER — HEPARIN SODIUM (PORCINE) 5000 UNIT/ML IJ SOLN
5000.0000 [IU] | Freq: Three times a day (TID) | INTRAMUSCULAR | Status: DC
  Administered 2014-05-01 – 2014-05-06 (×14): 5000 [IU] via SUBCUTANEOUS
  Filled 2014-05-01 (×15): qty 1

## 2014-05-01 NOTE — ED Provider Notes (Signed)
CSN: [id number]     Arrival date & time 05/01/14  1351 History   This chart was scribed for Sharyon Cable, MD by Rayfield Citizen, ED Scribe. This patient was seen in room APA04/APA04 and the patient's care was started at 2:10 PM.    Chief Complaint  Patient presents with  . Weakness   Patient is a 79 y.o. female presenting with weakness. The history is provided by the patient. No language interpreter was used.  Weakness This is a new problem. The current episode started more than 1 week ago. The problem occurs constantly. The problem has been gradually worsening. Associated symptoms include abdominal pain. Pertinent negatives include no chest pain. The symptoms are aggravated by walking. Nothing relieves the symptoms. She has tried nothing for the symptoms.     HPI Comments: Kathy Marshall is a 79 y.o. female who presents to the Emergency Department complaining of gradual onset, worsening generalized weakness. Patient has not been eating well and has been sleeping much more than normal. Over the past two days, patient has begun feeling nauseous and vomiting; she has had abdominal pain for the past two days that is abated at present. She explains that when ambulating patient is "staggering." Patient's family reports that patient has been sickly lately - recently seen by a physician for potential illness (PNA, on antibiotics). Daughter has been treating patient's symptoms with OTC Mucinex with minimal relief. Patient denies chest or back pain, syncope, abnormal headaches, vision changes, unilateral weakness, swelling in her legs, dysuria. She denies falls or head injuries.   Patient is a .5ppd smoker.   Her PCP is Dr. Dennard Schaumann in [facility].   Past Medical History  Diagnosis Date  . Low back pain   . Bronchitis   . Chronic hoarseness   . Sleep apnea, obstructive   . Renal artery stenosis   . PVD (peripheral vascular disease)   . Hypercholesteremia   . Pain in limb   . COPD (chronic  obstructive pulmonary disease)   . Benign essential hypertension   . Atherosclerosis     coronary vessel type  . Coronary artery disease   . Diverticulosis   . History of GI diverticular bleed   . History of esophagitis   . H. pylori infection   . Altered mental status   . Pneumonia   . TIA (transient ischemic attack) 07/2010  . Arthritis   . GERD (gastroesophageal reflux disease)   . History of blood clots   . Tubular adenoma 2012  . Heart murmur   . CHF (congestive heart failure)   . Anginal pain   . Myocardial infarct 1990's    "I've only had one" (12/06/2012)  . Exertional shortness of breath   . Daily headache   . Anxiety   . Gastric ulcer 07/2012    large; required clipping Archie Endo 08/23/2012 (12/06/2012)   Past Surgical History  Procedure Laterality Date  . Oophorectomy    . Coronary angioplasty with stent placement  2006    Cypher stent to RCA. On cath in 2008 stent described as widely patent.   . Esophagogastroduodenoscopy N/A 08/21/2012    Procedure: ESOPHAGOGASTRODUODENOSCOPY (EGD);  Surgeon: Lafayette Dragon, MD;  Location: [facility] ENDOSCOPY;  Service: Endoscopy;  Laterality: N/A;  . Vaginal hysterectomy    . Esophagogastroduodenoscopy N/A 12/07/2012    Procedure: ESOPHAGOGASTRODUODENOSCOPY (EGD);  Surgeon: Gatha Mayer, MD;  Location: [facility] ENDOSCOPY;  Service: Endoscopy;  Laterality: N/A;   Family History  Problem Relation Age  of Onset  . Heart disease Mother   . Stroke Mother   . Stomach cancer Mother   . Heart disease Father   . Stroke Father   . Heart disease Sister   . Colon cancer Neg Hx   . Lung cancer Sister    History  Substance Use Topics  . Smoking status: Current Every Day Smoker -- 0.50 packs/day for 60 years    Types: Cigarettes  . Smokeless tobacco: Never Used  . Alcohol Use: No     Comment: occasional   OB History    No data available     Review of Systems  Constitutional: Positive for appetite change.  Eyes: Negative for visual disturbance.   Cardiovascular: Negative for chest pain and leg swelling.  Gastrointestinal: Positive for nausea, vomiting and abdominal pain.  Genitourinary: Negative for dysuria.  Musculoskeletal: Negative for back pain.  Neurological: Positive for weakness.  All other systems reviewed and are negative.     Allergies  Review of patient's allergies indicates no known allergies.  Home Medications   Prior to Admission medications   Medication Sig Start Date End Date Taking? Authorizing Provider  acetaminophen (TYLENOL) 325 MG tablet Take 650 mg by mouth every 6 (six) hours as needed.    Historical Provider, MD  albuterol (PROAIR HFA) 108 (90 BASE) MCG/ACT inhaler Inhale 2 puffs into the lungs every 4 (four) hours as needed for wheezing. 01/14/13   Alycia Rossetti, MD  ALPRAZolam Duanne Moron) 0.5 MG tablet TAKE 1 TABLET BY MOUTH TWICE A DAY AS NEEDED 03/17/14   Susy Frizzle, MD  atorvastatin (LIPITOR) 80 MG tablet Take 1 tablet (80 mg total) by mouth at bedtime. MUST KEEP APPOINTMENT FOR FUTURE REFILLS. 04/11/14   Troy Sine, MD  diphenoxylate-atropine (LOMOTIL) 2.5-0.025 MG per tablet Take 1 tablet by mouth 4 (four) times daily as needed for diarrhea or loose stools. 11/11/13   Susy Frizzle, MD  donepezil (ARICEPT) 5 MG tablet Take 1 tablet (5 mg total) by mouth at bedtime. 08/19/13   Susy Frizzle, MD  dronabinol (MARINOL) 2.5 MG capsule Take 1 capsule (2.5 mg total) by mouth daily. 04/22/14   Susy Frizzle, MD  fluticasone (FLONASE) 50 MCG/ACT nasal spray PLACE 2 SPRAYS INTO BOTH NOSTRILS DAILY.    Susy Frizzle, MD  fluticasone-salmeterol (ADVAIR HFA) [phone] MCG/ACT inhaler Inhale 2 puffs into the lungs 2 (two) times daily as needed (wheezing and shortness of breath). 09/16/13   Susy Frizzle, MD  furosemide (LASIX) 20 MG tablet Take 1 tablet (20 mg total) by mouth daily. 01/31/14   Susy Frizzle, MD  gabapentin (NEURONTIN) 300 MG capsule TAKE 1 CAPSULE BY MOUTH DAILY AT BEDTIME 12/05/13    Susy Frizzle, MD  isosorbide mononitrate (IMDUR) 60 MG 24 hr tablet Take 1 tablet (60 mg total) by mouth daily. Keep office visit 11/24 in order to get further refills 04/28/14   Troy Sine, MD  levofloxacin (LEVAQUIN) 500 MG tablet Take 1 tablet (500 mg total) by mouth daily. 04/11/14   Susy Frizzle, MD  losartan (COZAAR) 50 MG tablet  10/11/13   Historical Provider, MD  metoCLOPramide (REGLAN) 5 MG tablet TAKE 1 TABLET BY MOUTH TWICE A DAY 04/03/14   Lafayette Dragon, MD  metoprolol tartrate (LOPRESSOR) 25 MG tablet Take 0.5 tablets (12.5 mg total) by mouth 2 (two) times daily. 11/29/13   Troy Sine, MD  mirtazapine (REMERON) 30 MG tablet Take 1 tablet (  30 mg total) by mouth at bedtime. 01/06/14   Susy Frizzle, MD  morphine (MS CONTIN) 30 MG 12 hr tablet  09/19/13   Historical Provider, MD  Nutritional Supplements (COMPLETE PROTEIN/VITAMIN SHAKE PO) Take 2-3 Bottles by mouth daily as needed (nutritional supplment).     Historical Provider, MD  oxyCODONE-acetaminophen (PERCOCET) 10-325 MG per tablet Take 1 tablet by mouth 5 (five) times daily as needed for pain.    Historical Provider, MD  pantoprazole (PROTONIX) 40 MG tablet TAKE 1 TABLET (40 MG TOTAL) BY MOUTH 2 (TWO) TIMES DAILY. 04/03/14   Lafayette Dragon, MD  predniSONE (DELTASONE) 20 MG tablet 3 tabs poqday 1-2, 2 tabs poqday 3-4, 1 tab poqday 5-6 04/11/14   Susy Frizzle, MD  promethazine (PHENERGAN) 25 MG tablet Take 1 tablet (25 mg total) by mouth at bedtime. 04/22/14   Susy Frizzle, MD  traMADol (ULTRAM) 50 MG tablet 1-2 every six hours as needed for headaches 12/03/13   Historical Provider, MD   BP 197/86 mmHg  Pulse 65  Temp(Src) 98.2 F (36.8 C) (Oral)  Resp 26  Ht 5\' 2"  (1.575 m)  Wt 86 lb (39.009 kg)  BMI 15.73 kg/m2  SpO2 88% Physical Exam  Nursing note and vitals reviewed.   CONSTITUTIONAL: Elderly, frail  HEAD: Normocephalic/atraumatic EYES: EOMI/PERRL ENMT: Mucous membranes moist NECK: supple no  meningeal signs SPINE:entire spine nontender CV: S1/S2 noted, no murmurs/rubs/gallops noted LUNGS: Coarse wheezing bilaterally, crackles in both bases  ABDOMEN: soft, nontender, no rebound or guarding GU:no cva tenderness NEURO: Pt is awake/alert, moves all extremitiesx4, no arm drift, no leg drift, no facial droop  EXTREMITIES: pulses normal, full ROM SKIN: warm, color normal PSYCH: no abnormalities of mood noted  ED Course  Procedures   DIAGNOSTIC STUDIES: Oxygen Saturation is 88% on [place] (2L/min), low by my interpretation.    COORDINATION OF CARE: 2:15 PM Discussed treatment plan with pt at bedside and pt agreed to plan. 5:07 PM Pt noted to have hypoKalemia with EKG changes (prolonged QT) Also reports recent generalized weakness She is also noted to have some hypoxia with wheeze Plan to admit for hypoKalemia and treatment of COPD exacerbation D/w dr Anastasio Champion, will admit to [facility] Reviewed  CBC WITH DIFFERENTIAL - Abnormal; Notable for the following:    Hemoglobin 15.3 (*)    All other components within normal limits  COMPREHENSIVE METABOLIC PANEL - Abnormal; Notable for the following:    Potassium 2.8 (*)    Glucose, Bld 109 (*)    BUN 25 (*)    GFR calc non Af Amer 81 (*)    All other components within normal limits  URINALYSIS, ROUTINE W REFLEX MICROSCOPIC  I-STAT TROPOININ, ED    Imaging Review Dg Chest Portable 1 View  05/01/2014   CLINICAL DATA:  Dyspnea.  Smoker.  EXAM: PORTABLE CHEST - 1 VIEW  COMPARISON:  09/29/2013  FINDINGS: COPD. Mild cardiac enlargement without heart failure. Aorta is uncoiled.  Negative for pneumonia or mass lesion.  IMPRESSION: COPD.  No acute abnormality.   Electronically Signed   By: Franchot Gallo M.D.   On: 05/01/2014 14:33     Date: 05/01/2014 1419  Rate: 60  Rhythm: normal sinus rhythm  QRS Axis: normal  Intervals: QT prolonged  ST/T Wave abnormalities: ST elevations inferiorly and ST elevations anteriorly   Conduction Disutrbances:none  Narrative Interpretation: u wave noted  Old EKG Reviewed: changes noted    MDM   Final diagnoses:  Dyspnea  COPD exacerbation  Hypokalemia  Prolonged Q-T interval on ECG    Nursing notes including past medical history and social history reviewed and considered in documentation xrays reviewed and considered Labs/vital reviewed and considered   I personally performed the services described in this documentation, which was scribed in my presence. The recorded information has been reviewed and is accurate.       Sharyon Cable, MD 05/01/14 [phone]

## 2014-05-01 NOTE — ED Notes (Signed)
EMS called out for sick call.  Nausea vomiting and diarrhea for last 2 days.  Pt has hardly eaten in the last 2 weeks.  Productive cough.  Recently treated for pneumonia.

## 2014-05-01 NOTE — ED Notes (Signed)
CRITICAL VALUE ALERT  Critical value received:  Potassium 2.8  Date of notification:  05/01/2014  Time of notification: 9179  Critical value read back:Yes.    Nurse who received alert:  [facility]  MD notified (1st page):  Dr. Christy Gentles   Time of first page:  1531  MD notified (2nd page):  Time of second page:  Responding MD:  Dr. Christy Gentles  Time MD responded:  [phone]

## 2014-05-01 NOTE — H&P (Signed)
[facility] History and Physical  DONNALYN JURAN GDJ:[id number] DOB: 11-29-1934 DOA: 05/01/2014  Referring physician: ER PCP: Odette Fraction, MD   Chief Complaint: diarrhea and vomiting  HPI: Kathy Marshall is a 79 y.o. female  This is a 79 year old lady who gives a 2 to three-day history of diarrhea and vomiting associated with abdominal cramping. When she presented to the emergency room with the symptoms along with weakness, she was noted to be quite severely hypoxic and I was called regarding her COPD. She really denies any increased dyspnea, cough although she describes a low-grade fever. Her main concern is a significant weight loss over the last year. She states that she used to weigh around 165 pounds and now she weighs almost half of that. She continues to smoke cigarettes. She is on chronic narcotics on a daily basis.   Review of Systems:  Constitutional:  No night sweats, chills, fatigue.  HEENT:  No headaches, Difficulty swallowing,Tooth/dental problems,Sore throat,  No sneezing, itching, ear ache, nasal congestion, post nasal drip,  Cardio-vascular:  No chest pain, Orthopnea, PND, swelling in lower extremities, anasarca, dizziness, palpitations   Resp:  No shortness of breath with exertion or at rest. No excess mucus, no productive cough, No non-productive cough, No coughing up of blood.No change in color of mucus.No wheezing.No chest wall deformity  Skin:  no rash or lesions.  GU:  no dysuria, change in color of urine, no urgency or frequency. No flank pain.  Musculoskeletal:  No joint pain or swelling. No decreased range of motion. No back pain.  Psych:  No change in mood or affect. No depression or anxiety. No memory loss.   Past Medical History  Diagnosis Date  . Low back pain   . Bronchitis   . Chronic hoarseness   . Sleep apnea, obstructive   . Renal artery stenosis   . PVD (peripheral vascular disease)   . Hypercholesteremia   . Pain  in limb   . COPD (chronic obstructive pulmonary disease)   . Benign essential hypertension   . Atherosclerosis     coronary vessel type  . Coronary artery disease   . Diverticulosis   . History of GI diverticular bleed   . History of esophagitis   . H. pylori infection   . Altered mental status   . Pneumonia   . TIA (transient ischemic attack) 07/2010  . Arthritis   . GERD (gastroesophageal reflux disease)   . History of blood clots   . Tubular adenoma 2012  . Heart murmur   . CHF (congestive heart failure)   . Anginal pain   . Myocardial infarct 1990's    "I've only had one" (12/06/2012)  . Exertional shortness of breath   . Daily headache   . Anxiety   . Gastric ulcer 07/2012    large; required clipping Archie Endo 08/23/2012 (12/06/2012)   Past Surgical History  Procedure Laterality Date  . Oophorectomy    . Coronary angioplasty with stent placement  2006    Cypher stent to RCA. On cath in 2008 stent described as widely patent.   . Esophagogastroduodenoscopy N/A 08/21/2012    Procedure: ESOPHAGOGASTRODUODENOSCOPY (EGD);  Surgeon: Lafayette Dragon, MD;  Location: [facility] ENDOSCOPY;  Service: Endoscopy;  Laterality: N/A;  . Vaginal hysterectomy    . Esophagogastroduodenoscopy N/A 12/07/2012    Procedure: ESOPHAGOGASTRODUODENOSCOPY (EGD);  Surgeon: Gatha Mayer, MD;  Location[facility] ENDOSCOPY;  Service: Endoscopy;  Laterality: N/A;   Social History:  reports that she  has been smoking Cigarettes.  She has a 30 pack-year smoking history. She has never used smokeless tobacco. She reports that she does not drink alcohol or use illicit drugs.  No Known Allergies  Family History  Problem Relation Age of Onset  . Heart disease Mother   . Stroke Mother   . Stomach cancer Mother   . Heart disease Father   . Stroke Father   . Heart disease Sister   . Colon cancer Neg Hx   . Lung cancer Sister      Prior to Admission medications   Medication Sig Start Date End Date Taking? Authorizing  Provider  acetaminophen (TYLENOL) 325 MG tablet Take 650 mg by mouth every 6 (six) hours as needed for headache.    Yes Historical Provider, MD  albuterol (PROAIR HFA) 108 (90 BASE) MCG/ACT inhaler Inhale 2 puffs into the lungs every 4 (four) hours as needed for wheezing. 01/14/13  Yes Alycia Rossetti, MD  ALPRAZolam Duanne Moron) 0.5 MG tablet TAKE 1 TABLET BY MOUTH TWICE A DAY AS NEEDED 03/17/14  Yes Susy Frizzle, MD  atorvastatin (LIPITOR) 80 MG tablet Take 1 tablet (80 mg total) by mouth at bedtime. MUST KEEP APPOINTMENT FOR FUTURE REFILLS. 04/11/14  Yes Troy Sine, MD  diphenoxylate-atropine (LOMOTIL) 2.5-0.025 MG per tablet Take 1 tablet by mouth 4 (four) times daily as needed for diarrhea or loose stools. 11/11/13  Yes Susy Frizzle, MD  donepezil (ARICEPT) 5 MG tablet Take 1 tablet (5 mg total) by mouth at bedtime. 08/19/13  Yes Susy Frizzle, MD  dronabinol (MARINOL) 2.5 MG capsule Take 1 capsule (2.5 mg total) by mouth daily. 04/22/14  Yes Susy Frizzle, MD  fluticasone (FLONASE) 50 MCG/ACT nasal spray PLACE 2 SPRAYS INTO BOTH NOSTRILS DAILY.   Yes Susy Frizzle, MD  fluticasone-salmeterol (ADVAIR HFA) [phone] MCG/ACT inhaler Inhale 2 puffs into the lungs 2 (two) times daily as needed (wheezing and shortness of breath). 09/16/13  Yes Susy Frizzle, MD  FLUZONE HIGH-DOSE 0.5 ML SUSY Inject 0.5 mLs as directed once. 03/20/14  Yes Historical Provider, MD  furosemide (LASIX) 20 MG tablet Take 1 tablet (20 mg total) by mouth daily. Patient taking differently: Take 20 mg by mouth daily as needed for fluid.  01/31/14  Yes Susy Frizzle, MD  gabapentin (NEURONTIN) 300 MG capsule TAKE 1 CAPSULE BY MOUTH DAILY AT BEDTIME 12/05/13  Yes Susy Frizzle, MD  HYDROmorphone (DILAUDID) 2 MG tablet Take 1 tablet by mouth 4 (four) times daily as needed for moderate pain.  03/20/14  Yes Historical Provider, MD  isosorbide mononitrate (IMDUR) 60 MG 24 hr tablet Take 1 tablet (60 mg total) by mouth  daily. Keep office visit 11/24 in order to get further refills 04/28/14  Yes Troy Sine, MD  losartan (COZAAR) 50 MG tablet Take 50 mg by mouth daily.  10/11/13  Yes Historical Provider, MD  metoCLOPramide (REGLAN) 5 MG tablet TAKE 1 TABLET BY MOUTH TWICE A DAY 04/03/14  Yes Lafayette Dragon, MD  metoprolol tartrate (LOPRESSOR) 25 MG tablet Take 0.5 tablets (12.5 mg total) by mouth 2 (two) times daily. 11/29/13  Yes Troy Sine, MD  mirtazapine (REMERON) 30 MG tablet Take 1 tablet (30 mg total) by mouth at bedtime. 01/06/14  Yes Susy Frizzle, MD  Multiple Vitamins-Minerals (ALIVE WOMENS 50+ PO) Take 1 tablet by mouth daily.   Yes Historical Provider, MD  Nutritional Supplements (COMPLETE PROTEIN/VITAMIN SHAKE PO) Take 2-3  Bottles by mouth daily as needed (nutritional supplment).    Yes Historical Provider, MD  ondansetron (ZOFRAN) 8 MG tablet Take 1 tablet by mouth 2 (two) times daily as needed for nausea or vomiting.  04/07/14  Yes Historical Provider, MD  oxyCODONE-acetaminophen (PERCOCET) 10-325 MG per tablet Take 1 tablet by mouth 5 (five) times daily as needed for pain.   Yes Historical Provider, MD  pantoprazole (PROTONIX) 40 MG tablet TAKE 1 TABLET (40 MG TOTAL) BY MOUTH 2 (TWO) TIMES DAILY. 04/03/14  Yes Lafayette Dragon, MD  promethazine (PHENERGAN) 25 MG tablet Take 1 tablet (25 mg total) by mouth at bedtime. Patient taking differently: Take 25 mg by mouth daily as needed for nausea or vomiting.  04/22/14  Yes Susy Frizzle, MD  levofloxacin (LEVAQUIN) 500 MG tablet Take 1 tablet (500 mg total) by mouth daily. Patient not taking: Reported on 05/01/2014 04/11/14   Susy Frizzle, MD  morphine (MS CONTIN) 30 MG 12 hr tablet Take 30 mg by mouth at bedtime.  09/19/13   Historical Provider, MD  predniSONE (DELTASONE) 20 MG tablet 3 tabs poqday 1-2, 2 tabs poqday 3-4, 1 tab poqday 5-6 Patient not taking: Reported on 05/01/2014 04/11/14   Susy Frizzle, MD  traMADol Veatrice Bourbon) 50 MG tablet 1-2  every six hours as needed for headaches 12/03/13   Historical Provider, MD   Physical Exam: Filed Vitals:   05/01/14 1600 05/01/14 1630 05/01/14 1700 05/01/14 1718  BP: 188/78 171/104 202/105   Pulse: 65 67 69   Temp:      TempSrc:      Resp: 27 27 29    Height:      Weight:      SpO2: 89% 88% 88% 88%    Wt Readings from Last 3 Encounters:  05/01/14 39.009 kg (86 lb)  04/11/14 39.009 kg (86 lb)  01/31/14 41.277 kg (91 lb)    General:  Appears anxious.also appears cachectic.she looks clinically dehydrated. Eyes: PERRL, normal lids, irises & conjunctiva ENT: grossly normal hearing, lips & tongue Neck: no LAD, masses or thyromegaly. There is no supraclavicular lymphadenopathy or neck lymphadenopathy. Cardiovascular: RRR, no m/r/g. No LE edema. Telemetry: SR, no arrhythmias  Respiratory: air entry reduced bilaterally. There is no wheezing, crackles or bronchial breathing. Abdomen: soft, ntnd Skin: no rash or induration seen on limited exam Musculoskeletal: grossly normal tone BUE/BLE Psychiatric: grossly normal mood and affect, speech fluent and appropriate Neurologic: grossly non-focal.          Labs on Admission:  Basic Metabolic Panel:  Recent Labs Lab 05/01/14 1427  NA 146  K 2.8*  CL 103  CO2 30  GLUCOSE 109*  BUN 25*  CREATININE 0.70  CALCIUM 9.3   Liver Function Tests:  Recent Labs Lab 05/01/14 1427  AST 23  ALT 22  ALKPHOS 97  BILITOT 0.6  PROT 7.7  ALBUMIN 3.8   No results for input(s): LIPASE, AMYLASE in the last 168 hours. No results for input(s): AMMONIA in the last 168 hours. CBC:  Recent Labs Lab 05/01/14 1427  WBC 6.4  NEUTROABS 4.5  HGB 15.3*  HCT 45.0  MCV 93.9  PLT 198   Cardiac Enzymes: No results for input(s): CKTOTAL, CKMB, CKMBINDEX, TROPONINI in the last 168 hours.  BNP (last 3 results)  Recent Labs  09/29/13 1155  PROBNP 582.1*   CBG: No results for input(s): GLUCAP in the last 168 hours.  Radiological Exams on  Admission: Dg Chest Portable 1 View  05/01/2014   CLINICAL DATA:  Dyspnea.  Smoker.  EXAM: PORTABLE CHEST - 1 VIEW  COMPARISON:  09/29/2013  FINDINGS: COPD. Mild cardiac enlargement without heart failure. Aorta is uncoiled.  Negative for pneumonia or mass lesion.  IMPRESSION: COPD.  No acute abnormality.   Electronically Signed   By: Franchot Gallo M.D.   On: 05/01/2014 14:33      Assessment/Plan   1. Gastroenteritis with clinical and biochemical dehydration, associated with hypokalemia. 2. Approximately 80-90 pound weight loss in the last 1 year. 3. COPD with possible exacerbation although there is no significant respiratory distress, although she is hypoxic, which may be her baseline. 4. Hypertension. 5. Protein calorie malnutrition, severe. 6. Ongoing tobacco abuse.  Plan: 1. Admit to medical floor. 2. IV fluids and potassium supplementation. 3. CT scan of the chest to make sure there is no malignancy in her chest accounting for her weight loss.  Further recommendations will depend on patient's hospital progress.   Code Status: full code.  Family Communication: I discussed the plan with the patient at the bedside.   Disposition Plan: home when medically stable.  Time spent: 60 minutes.  Doree Albee [facility] Pager [phone].

## 2014-05-01 NOTE — ED Provider Notes (Signed)
Pt has also had recent weight loss that family is concerned about and would like workup for this as well  Sharyon Cable, MD 05/01/14 1709

## 2014-05-02 ENCOUNTER — Inpatient Hospital Stay (HOSPITAL_COMMUNITY): Payer: Medicare HMO

## 2014-05-02 DIAGNOSIS — R6251 Failure to thrive (child): Secondary | ICD-10-CM | POA: Diagnosis present

## 2014-05-02 DIAGNOSIS — R112 Nausea with vomiting, unspecified: Secondary | ICD-10-CM | POA: Diagnosis present

## 2014-05-02 DIAGNOSIS — Z720 Tobacco use: Secondary | ICD-10-CM

## 2014-05-02 LAB — COMPREHENSIVE METABOLIC PANEL
ALT: 19 U/L (ref 0–35)
ANION GAP: 10 (ref 5–15)
AST: 26 U/L (ref 0–37)
Albumin: 3.4 g/dL — ABNORMAL LOW (ref 3.5–5.2)
Alkaline Phosphatase: 91 U/L (ref 39–117)
BILIRUBIN TOTAL: 0.5 mg/dL (ref 0.3–1.2)
BUN: 22 mg/dL (ref 6–23)
CO2: 28 meq/L (ref 19–32)
CREATININE: 0.61 mg/dL (ref 0.50–1.10)
Calcium: 8.9 mg/dL (ref 8.4–10.5)
Chloride: 110 mEq/L (ref 96–112)
GFR calc Af Amer: >90 mL/min (ref >90)
GFR calc non Af Amer: 85 mL/min — ABNORMAL LOW (ref >90)
GLUCOSE: 110 mg/dL — AB (ref 70–99)
Potassium: 4.7 mEq/L (ref 3.7–5.3)
Sodium: 148 mEq/L — ABNORMAL HIGH (ref 137–147)
Total Protein: 7 g/dL (ref 6.0–8.3)

## 2014-05-02 LAB — CBC
HCT: 41.5 % (ref 36.0–46.0)
Hemoglobin: 13.8 g/dL (ref 12.0–15.0)
MCH: 31.7 pg (ref 26.0–34.0)
MCHC: 33.3 g/dL (ref 30.0–36.0)
MCV: 95.2 fL (ref 78.0–100.0)
PLATELETS: 188 10*3/uL (ref 150–400)
RBC: 4.36 MIL/uL (ref 3.87–5.11)
RDW: 13.6 % (ref 11.5–15.5)
WBC: 4.9 10*3/uL (ref 4.0–10.5)

## 2014-05-02 LAB — TSH: TSH: 2.25 u[IU]/mL (ref 0.350–4.500)

## 2014-05-02 MED ORDER — DEXTROSE-NACL 5-0.45 % IV SOLN
INTRAVENOUS | Status: DC
  Administered 2014-05-02: 19:00:00 via INTRAVENOUS
  Filled 2014-05-02 (×6): qty 1000

## 2014-05-02 MED ORDER — IOHEXOL 300 MG/ML  SOLN
100.0000 mL | Freq: Once | INTRAMUSCULAR | Status: AC | PRN
  Administered 2014-05-02: 100 mL via INTRAVENOUS

## 2014-05-02 MED ORDER — TIOTROPIUM BROMIDE MONOHYDRATE 18 MCG IN CAPS
18.0000 ug | ORAL_CAPSULE | Freq: Every day | RESPIRATORY_TRACT | Status: DC
  Administered 2014-05-02 – 2014-05-06 (×5): 18 ug via RESPIRATORY_TRACT
  Filled 2014-05-02: qty 5

## 2014-05-02 MED ORDER — CETYLPYRIDINIUM CHLORIDE 0.05 % MT LIQD
7.0000 mL | Freq: Two times a day (BID) | OROMUCOSAL | Status: DC
  Administered 2014-05-02 – 2014-05-06 (×9): 7 mL via OROMUCOSAL

## 2014-05-02 MED ORDER — IOHEXOL 300 MG/ML  SOLN
50.0000 mL | Freq: Once | INTRAMUSCULAR | Status: AC | PRN
  Administered 2014-05-02: 50 mL via ORAL

## 2014-05-02 MED ORDER — METOPROLOL TARTRATE 25 MG PO TABS
25.0000 mg | ORAL_TABLET | Freq: Two times a day (BID) | ORAL | Status: DC
  Administered 2014-05-02 – 2014-05-06 (×8): 25 mg via ORAL
  Filled 2014-05-02 (×8): qty 1

## 2014-05-02 MED ORDER — TIOTROPIUM BROMIDE MONOHYDRATE 18 MCG IN CAPS
ORAL_CAPSULE | RESPIRATORY_TRACT | Status: AC
  Filled 2014-05-02: qty 5

## 2014-05-02 MED ORDER — PRO-STAT SUGAR FREE PO LIQD
30.0000 mL | Freq: Three times a day (TID) | ORAL | Status: DC
Start: 2014-05-02 — End: 2014-05-06
  Administered 2014-05-02 – 2014-05-06 (×12): 30 mL via ORAL
  Filled 2014-05-02 (×11): qty 30

## 2014-05-02 MED ORDER — BOOST / RESOURCE BREEZE PO LIQD
1.0000 | Freq: Three times a day (TID) | ORAL | Status: DC
  Administered 2014-05-02 – 2014-05-05 (×10): 1 via ORAL

## 2014-05-02 NOTE — Plan of Care (Signed)
Problem: Phase I Progression Outcomes Goal: Hemodynamically stable Outcome: Progressing     

## 2014-05-02 NOTE — Progress Notes (Signed)
[facility] PROGRESS NOTE  Kathy Marshall RUE:[id number] DOB: 18-Feb-1935 DOA: 05/01/2014 PCP: Odette Fraction, MD  Assessment/Plan: 1. Gastroenteritis resulting in  dehydration, associated with hypokalemia. Provided with IV fluids and potassium supplementation. Improved quickly. Will advance diet. Monitor electrolytes 2. Approximately 80-90 pound weight loss in the last 1 year. CT angio chest pending. Concern for malignancy 3. COPD with possible exacerbation. Chest xray negative for pna.  She is not at baseline with respiratory status. Not on oxygen at home. Continue nebs and inhaler. Currently oxygen saturation 94% on 4L. Will wean oxygen as tolerated  4. Hypertension. Poor control. Currently on home regimen except for lasix. Will continue to hold this and increase metoprolol with parameters. monitor 5. Protein calorie malnutrition, severe. BMI 14.6. supplements   Code Status: full Family Communication: grandaughter Disposition Plan: home when ready   Consultants:  none  Procedures:  none  Antibiotics:  none  HPI/Subjective: Awake alert reports feeling "so much better" and requesting food  Objective: Filed Vitals:   05/02/14 0536  BP: 182/72  Pulse: 62  Temp: 98.5 F (36.9 C)  Resp: 21    Intake/Output Summary (Last 24 hours) at 05/02/14 1205 Last data filed at 05/02/14 0815  Gross per 24 hour  Intake 1183.33 ml  Output    400 ml  Net 783.33 ml   Filed Weights   05/01/14 1354 05/01/14 1912 05/02/14 0536  Weight: 39.009 kg (86 lb) 36.2 kg (79 lb 12.9 oz) 36.2 kg (79 lb 12.9 oz)    Exam:   General:  Very thin frail chronically ill appearing  Cardiovascular: RRR No MGR No LE edema  Respiratory: mild increased work of breathing with conversation. No wheeze. Scattered rhonchi distant BS  Abdomen: flat soft +BS non-tender  Musculoskeletal: joints without swelling/erythema   Data Reviewed: Basic Metabolic Panel:  Recent Labs Lab  05/01/14 1427 05/02/14 0542  NA 146 148*  K 2.8* 4.7  CL 103 110  CO2 30 28  GLUCOSE 109* 110*  BUN 25* 22  CREATININE 0.70 0.61  CALCIUM 9.3 8.9   Liver Function Tests:  Recent Labs Lab 05/01/14 1427 05/02/14 0542  AST 23 26  ALT 22 19  ALKPHOS 97 91  BILITOT 0.6 0.5  PROT 7.7 7.0  ALBUMIN 3.8 3.4*   No results for input(s): LIPASE, AMYLASE in the last 168 hours. No results for input(s): AMMONIA in the last 168 hours. CBC:  Recent Labs Lab 05/01/14 1427 05/02/14 0542  WBC 6.4 4.9  NEUTROABS 4.5  --   HGB 15.3* 13.8  HCT 45.0 41.5  MCV 93.9 95.2  PLT 198 188   Cardiac Enzymes: No results for input(s): CKTOTAL, CKMB, CKMBINDEX, TROPONINI in the last 168 hours. BNP (last 3 results)  Recent Labs  09/29/13 1155  PROBNP 582.1*   CBG: No results for input(s): GLUCAP in the last 168 hours.  No results found for this or any previous visit (from the past 240 hour(s)).   Studies: Dg Chest Portable 1 View  05/01/2014   CLINICAL DATA:  Dyspnea.  Smoker.  EXAM: PORTABLE CHEST - 1 VIEW  COMPARISON:  09/29/2013  FINDINGS: COPD. Mild cardiac enlargement without heart failure. Aorta is uncoiled.  Negative for pneumonia or mass lesion.  IMPRESSION: COPD.  No acute abnormality.   Electronically Signed   By: Franchot Gallo M.D.   On: 05/01/2014 14:33    Scheduled Meds: . antiseptic oral rinse  7 mL Mouth Rinse BID  . atorvastatin  80 mg Oral  QHS  . donepezil  5 mg Oral QHS  . dronabinol  2.5 mg Oral Daily  . gabapentin  300 mg Oral QHS  . heparin  5,000 Units Subcutaneous 3 times per day  . isosorbide mononitrate  60 mg Oral Daily  . losartan  50 mg Oral Daily  . metoCLOPramide  5 mg Oral TID AC  . metoprolol tartrate  12.5 mg Oral BID  . mirtazapine  30 mg Oral QHS  . mometasone-formoterol  2 puff Inhalation BID  . morphine  30 mg Oral QHS  . pantoprazole  40 mg Oral Daily  . sodium chloride  3 mL Intravenous Q12H   Continuous Infusions: . 0.9 % NaCl with KCl  40 mEq / L 100 mL/hr (05/02/14 0801)    Principal Problem:   COPD exacerbation Active Problems:   HYPERTENSION, BENIGN ESSENTIAL   WEIGHT LOSS   Protein-calorie malnutrition, severe   Gastroenteritis   Tobacco abuse    Time spent: 54 minutes    [facility] Pager [phone]. If 7PM-7AM, please contact night-coverage at [email or web address], password [facility] 05/02/2014, 12:05 PM  LOS: 1 day

## 2014-05-02 NOTE — Care Management Note (Signed)
    Page 1 of 2   05/06/2014     1:46:15 PM CARE MANAGEMENT NOTE 05/06/2014  Patient:  Kathy Marshall, Kathy Marshall   Account Number:  [id number]  Date Initiated:  05/02/2014  Documentation initiated by:  Vladimir Creeks  Subjective/Objective Assessment:   admitted with vomiting and diarrhea, and possible COPD flare, with low O2 sats. pt is from home, with spouse and children and grandchildren who are active in her care.     Action/Plan:   Pt requested [facility] for [facility]   Anticipated DC Date:  05/06/2014   Anticipated DC Plan:  [place]  CM consult      [facility] Choice  HOME HEALTH   Choice offered to / List presented to:  C-1 Patient   DME arranged  3-N-1  [place] arranged  HH-1 RN  [place] agency  [place]   Status of service:  Completed, signed off Medicare Important Message given?  YES (If response is "NO", the following Medicare IM given date fields will be blank) Date Medicare IM given:  05/02/2014 Medicare IM given by:  Vladimir Creeks Date Additional Medicare IM given:  05/06/2014 Additional Medicare IM given by:  Vladimir Creeks  Discharge Disposition:  [place]  Per UR Regulation:  Reviewed for med. necessity/level of care/duration of stay  If discussed at [place] of Stay Meetings, dates discussed:    Comments:  11/10 15 1330 Vladimir Creeks RN/CM pt being D/C home with family and [place] to follow 05/02/14 1100 Vladimir Creeks RN/CM

## 2014-05-02 NOTE — Care Management Utilization Note (Signed)
UR completed 

## 2014-05-02 NOTE — Progress Notes (Signed)
INITIAL NUTRITION ASSESSMENT  Pt meets criteria for severe MALNUTRITION in the context of chronic illness as evidenced by muscle wasting (clavicle, patella, anterior thigh) and fat mass loss upper arms and orbital regions as well as severe wt loss over time.     DOCUMENTATION CODES Per approved criteria  -Severe malnutrition in the context of chronic illness   INTERVENTION: Resource Breeze po TID, each supplement provides 250 kcal and 9 grams of protein   ProStat 30 ml TID (each 30 ml provides 100 kcal, 15 gr protein)   Recommend referral to GI for evaluation of possible PEG placement  NUTRITION DIAGNOSIS: Inadequate oral intake related to early satiety, chronic nausea as evidenced by severe ongoing  wt loss and muscle wasting (clavicle, patella, anterior thigh) and fat mass loss upper arms and orbital regions.  Goal: Pt to meet >/= 90% of their estimated nutrition needs     Monitor: Po intake, labs and wt trends    Reason for Assessment: Malnutrition Screen Report  79 y.o. female  Admitting Dx: COPD exacerbation  ASSESSMENT: Pt with hx of COPD, CAD, GERD, GIB, gastric ulcers and coffee ground emesis. Last assessed by RD June 05/2013 during [facility] admission. at that time she had nausea vomiting and weight loss 8% in 2 months and she weighed 102# at that time.   Over the past 16 months her weight has decreased an additional 22#, 22%.  Pt and granddaughter reports pt weighed as much as 165# a few years ago.   She is severely malnourished. Muscle wasting in multiple regions as well as loss of fat mass.  She started Marinol on Monday and has been taking Remeron for appetite. C/o early satiety. Very little protein intake. Eats cereal and milk for breakfast with coffee and family provides "little Debbie" cakes for her to snack on during the day. She will sometimes eat a bowl of cereal again before bed. Reviewed high protein (non-animal) foods with her eggs, greek yogurt, protein powder,  cottage cheese, etc.  Nutrition Focused Physical Exam:  Subcutaneous Fat:  Orbital Region: severe depletion Upper Arm Region: severe depletion Thoracic and Lumbar Region: severe depletion  Muscle:  Temple Region: moderate wasting Clavicle Bone Region: severe wasting Clavicle and Acromion Bone Region: severe wasting Scapular Bone Region: severe wasting Dorsal Hand: moderate wasting Patellar Region: severe wasting Anterior Thigh Region: severe wasting Posterior Calf Region:   Edema: none    Height: Ht Readings from Last 1 Encounters:  05/01/14 5\' 2"  (1.575 m)    Weight: Wt Readings from Last 1 Encounters:  05/02/14 79 lb 12.9 oz (36.2 kg)    Ideal Body Weight: 110#  % Ideal Body Weight: 73%  Wt Readings from Last 10 Encounters:  05/02/14 79 lb 12.9 oz (36.2 kg)  04/11/14 86 lb (39.009 kg)  01/31/14 91 lb (41.277 kg)  01/06/14 85 lb (38.556 kg)  12/13/13 86 lb (39.009 kg)  10/18/13 91 lb (41.277 kg)  10/17/13 90 lb (40.824 kg)  10/15/13 91 lb 6 oz (41.447 kg)  10/03/13 84 lb 12.8 oz (38.465 kg)  09/26/13 90 lb 8 oz (41.051 kg)    Usual Body Weight: 102#  % Usual Body Weight: 73%  BMI:  Body mass index is 14.59 kg/(m^2). underweight  Estimated Nutritional Needs: Kcal: 1260-1440 Protein: 54-65 gr Fluid: 1.3-1.5 liters daily  Skin: intact  Diet Order: DIET SOFT  EDUCATION NEEDS: -Education needs addressed in particular addressing how to increase protein intake from non animal sources .   Intake/Output  Summary (Last 24 hours) at 05/02/14 1516 Last data filed at 05/02/14 1200  Gross per 24 hour  Intake 1423.33 ml  Output    700 ml  Net 723.33 ml    Last BM: 04/30/14  Labs:   Recent Labs Lab 05/01/14 1427 05/02/14 0542  NA 146 148*  K 2.8* 4.7  CL 103 110  CO2 30 28  BUN 25* 22  CREATININE 0.70 0.61  CALCIUM 9.3 8.9  GLUCOSE 109* 110*    CBG (last 3)  No results for input(s): GLUCAP in the last 72 hours.  Scheduled Meds: .  antiseptic oral rinse  7 mL Mouth Rinse BID  . atorvastatin  80 mg Oral QHS  . donepezil  5 mg Oral QHS  . dronabinol  2.5 mg Oral Daily  . gabapentin  300 mg Oral QHS  . heparin  5,000 Units Subcutaneous 3 times per day  . isosorbide mononitrate  60 mg Oral Daily  . losartan  50 mg Oral Daily  . metoCLOPramide  5 mg Oral TID AC  . metoprolol tartrate  25 mg Oral BID  . mirtazapine  30 mg Oral QHS  . mometasone-formoterol  2 puff Inhalation BID  . morphine  30 mg Oral QHS  . pantoprazole  40 mg Oral Daily  . sodium chloride  3 mL Intravenous Q12H    Continuous Infusions: . 0.9 % NaCl with KCl 40 mEq / L 50 mL/hr (05/02/14 1356)    Past Medical History  Diagnosis Date  . Low back pain   . Bronchitis   . Chronic hoarseness   . Sleep apnea, obstructive   . Renal artery stenosis   . PVD (peripheral vascular disease)   . Hypercholesteremia   . Pain in limb   . COPD (chronic obstructive pulmonary disease)   . Benign essential hypertension   . Atherosclerosis     coronary vessel type  . Coronary artery disease   . Diverticulosis   . History of GI diverticular bleed   . History of esophagitis   . H. pylori infection   . Altered mental status   . Pneumonia   . TIA (transient ischemic attack) 07/2010  . Arthritis   . GERD (gastroesophageal reflux disease)   . History of blood clots   . Tubular adenoma 2012  . Heart murmur   . CHF (congestive heart failure)   . Anginal pain   . Myocardial infarct 1990's    "I've only had one" (12/06/2012)  . Exertional shortness of breath   . Daily headache   . Anxiety   . Gastric ulcer 07/2012    large; required clipping Archie Endo 08/23/2012 (12/06/2012)    Past Surgical History  Procedure Laterality Date  . Oophorectomy    . Coronary angioplasty with stent placement  2006    Cypher stent to RCA. On cath in 2008 stent described as widely patent.   . Esophagogastroduodenoscopy N/A 08/21/2012    Procedure: ESOPHAGOGASTRODUODENOSCOPY (EGD);   Surgeon: Lafayette Dragon, MD;  Location: [facility] ENDOSCOPY;  Service: Endoscopy;  Laterality: N/A;  . Vaginal hysterectomy    . Esophagogastroduodenoscopy N/A 12/07/2012    Procedure: ESOPHAGOGASTRODUODENOSCOPY (EGD);  Surgeon: Gatha Mayer, MD;  Location: [facility] ENDOSCOPY;  Service: Endoscopy;  Laterality: N/A;    Colman Cater MS,RD,CSG,LDN Office: [phone] Pager: [phone]

## 2014-05-03 ENCOUNTER — Other Ambulatory Visit: Payer: Self-pay | Admitting: Cardiovascular Disease

## 2014-05-03 DIAGNOSIS — J189 Pneumonia, unspecified organism: Secondary | ICD-10-CM

## 2014-05-03 DIAGNOSIS — A310 Pulmonary mycobacterial infection: Secondary | ICD-10-CM | POA: Diagnosis present

## 2014-05-03 LAB — BASIC METABOLIC PANEL
Anion gap: 12 (ref 5–15)
BUN: 10 mg/dL (ref 6–23)
CHLORIDE: 104 meq/L (ref 96–112)
CO2: 26 mEq/L (ref 19–32)
CREATININE: 0.51 mg/dL (ref 0.50–1.10)
Calcium: 9 mg/dL (ref 8.4–10.5)
GFR calc Af Amer: >90 mL/min (ref >90)
GFR calc non Af Amer: >90 mL/min (ref >90)
GLUCOSE: 90 mg/dL (ref 70–99)
POTASSIUM: 3.4 meq/L — AB (ref 3.7–5.3)
Sodium: 142 mEq/L (ref 137–147)

## 2014-05-03 LAB — CBC
HCT: 41.4 % (ref 36.0–46.0)
HEMOGLOBIN: 13.6 g/dL (ref 12.0–15.0)
MCH: 31.5 pg (ref 26.0–34.0)
MCHC: 32.9 g/dL (ref 30.0–36.0)
MCV: 95.8 fL (ref 78.0–100.0)
Platelets: 164 10*3/uL (ref 150–400)
RBC: 4.32 MIL/uL (ref 3.87–5.11)
RDW: 13.9 % (ref 11.5–15.5)
WBC: 6.7 10*3/uL (ref 4.0–10.5)

## 2014-05-03 MED ORDER — POTASSIUM CHLORIDE CRYS ER 20 MEQ PO TBCR
40.0000 meq | EXTENDED_RELEASE_TABLET | Freq: Once | ORAL | Status: AC
  Administered 2014-05-03: 40 meq via ORAL
  Filled 2014-05-03: qty 2

## 2014-05-03 MED ORDER — LEVOFLOXACIN IN D5W 750 MG/150ML IV SOLN
750.0000 mg | INTRAVENOUS | Status: DC
  Administered 2014-05-03 – 2014-05-04 (×2): 750 mg via INTRAVENOUS
  Filled 2014-05-03 (×2): qty 150

## 2014-05-03 MED ORDER — DEXTROSE-NACL 5-0.45 % IV SOLN
INTRAVENOUS | Status: DC
  Administered 2014-05-03 – 2014-05-06 (×6): via INTRAVENOUS

## 2014-05-03 NOTE — Consult Note (Signed)
Kathy Marshall, Kathy Marshall NO.:  [id number]  MEDICAL RECORD NO.:  [id number]  LOCATION:  N470                          FACILITY:  [facility]  PHYSICIAN:  Elmyra Banwart L. Luan Pulling, M.D.DATE OF BIRTH:  [birth year]  DATE OF CONSULTATION: DATE OF DISCHARGE:                                CONSULTATION   REASON FOR CONSULTATION:  Abnormal chest CT.  HISTORY:  Ms. Depolo is a 79 year old, who has a long known history of COPD.  She was in her usual state of poor health at home when she developed increasing problems with cough, congestion, and shortness of breath.  She also was noted to have nausea, vomiting, and diarrhea.  She was weak.  She was very hypoxic on admission.  She has lost approximately 80 pounds in the last 18 months.  She does continue to smoke cigarettes.  She had CT scanning of the chest, abdomen, and pelvis looking for occult malignancy.  Her chest CT showed abnormalities of bronchiectasis and potentially problems with an atypical mycobacterial infection.  She did have a colon lesion and that has not been fully evaluated yet.  REVIEW OF SYSTEMS:  Her review of systems is per the history and physical.  She has not been able to eat in several months.  She has been coughing mostly nonproductively.  PAST MEDICAL HISTORY:  Positive for low back pain, sleep apnea, renal artery stenosis, peripheral arterial disease, COPD, hypertension, coronary artery occlusive disease, history of GI bleeding, history of Helicobacter pylori infection, previous episodes of pneumonia, TIA, GERD, CHF, previous MI, and gastric ulcer.  PAST SURGICAL HISTORY:  Surgically, she had oophorectomy, coronary angioplasty with stent placement, EGD, vaginal hysterectomy.  SOCIAL HISTORY:  She continues to smoke and she smokes about half a packet of cigarettes daily and probably has about a 40 to 60 pack year smoking history.  FAMILY HISTORY:  Her family history is positive for heart disease  and stroke in her mother.  Heart disease and stroke in her father.  There is a lung cancer in her sister.  MEDICATIONS:  At home, she has been taking Tylenol, albuterol, Xanax, Lipitor, Lomotil, Aricept, Marinol, Flonase, Advair, Lasix, gabapentin, Dilaudid, Imdur, losartan, Reglan, metoprolol, mirtazapine, vitamins, protein shakes, Zofran, Percocet, Protonix, Phenergan, Levaquin, MS Contin, and prednisone, and had been on tramadol which I do not think she is taking now.  PHYSICAL EXAMINATION:  GENERAL:  Shows a thin cachectic female in no acute distress, who looks acute, chronically sick. VITAL SIGNS:  Her temperature is 99.1, pulse 57, respirations 20, blood pressure 183/78, O2 sats 93%. HEENT:  Her pupils are reactive.  Nose and throat are clear.  Mucous membranes are moist. NECK:  Supple. CHEST:  Shows diminished breath sounds and some crackles bilaterally. HEART:  Regular without gallop. ABDOMEN:  Soft.  No masses are felt.  Bowel sounds are present. EXTREMITIES:  No clubbing or cyanosis. NEUROLOGIC:  She does not have any focal abnormalities on her neurological examination.  Assessment then is that she has had an 80 to 90 pound weight loss, has known chronic obstructive pulmonary disease with bronchiectasis, a previous and has protein-calorie malnutrition.  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  She is not at this point able to undergo testing for this, but I would see her as an outpatient probably set her up for bronchoscopy.  I explained to her family that the treatment for this is difficult to take and it is not particularly effective, although, they may elect not to do that.  Thanks for allowing me to see her with you.     Michiel Sivley L. Luan Pulling, M.D.     ELH/MEDQ  D:  05/03/2014  T:  05/03/2014  Job:  [id number]

## 2014-05-03 NOTE — Plan of Care (Signed)
Problem: Phase I Progression Outcomes Goal: O2 sats > or equal 90% or at baseline Outcome: Progressing Goal: Dyspnea controlled at rest Outcome: Progressing Goal: Hemodynamically stable Outcome: Progressing Goal: Pain controlled Outcome: Progressing Goal: Tolerating diet Outcome: Progressing

## 2014-05-03 NOTE — Progress Notes (Addendum)
[facility] PROGRESS NOTE  Gabrelle Roca Bruhl ZDG:[id number] DOB: 1935-02-27 DOA: 05/01/2014 PCP: Odette Fraction, MD  Assessment/Plan: 1. Gastroenteritis resulting in  dehydration, associated with hypokalemia. Provided with IV fluids and potassium supplementation. Improved quickly. Diet has been advanced. Monitor electrolytes 2. Approximately 80-90 pound weight loss in the last 1 year. CT chest shows possible MAI infection. This could certainly explain her progressive weight loss. Will request pulmonology consultation. CT of her abdomen and pelvis indicates possible lesion in transverse colon. This may be stool/debris. Will request gastroenterology to review CT to see if colonoscopy is necessary. 3. Community-acquired pneumonia. CT chest also indicates possible underlying pneumonia. She has been started on Levaquin. 4. COPD with possible exacerbation. Chest xray negative for pna.  She has been weaned down to room air and appears to be approaching her baseline respiratory status. Will continue current treatments. 5. Hypertension. Poor control. Currently on home regimen except for lasix. Will continue to hold this and increase metoprolol with parameters. monitor 6. Protein calorie malnutrition, severe. BMI 14.6. Nutrition consult   Code Status: full Family Communication: grandaughter Disposition Plan: home when ready   Consultants:  pulmonology  Procedures:  none  Antibiotics:  levaquin 11/7  HPI/Subjective: No vomiting, breathing feels like its improving, she has a productive cough  Objective: Filed Vitals:   05/03/14 1334  BP: 127/53  Pulse: 65  Temp: 97.6 F (36.4 C)  Resp: 18    Intake/Output Summary (Last 24 hours) at 05/03/14 1827 Last data filed at 05/03/14 1444  Gross per 24 hour  Intake  367.5 ml  Output   3980 ml  Net -3612.5 ml   Filed Weights   05/01/14 1354 05/01/14 1912 05/02/14 0536  Weight: 39.009 kg (86 lb) 36.2 kg (79 lb 12.9 oz) 36.2 kg  (79 lb 12.9 oz)    Exam:   General:  Very thin frail chronically ill appearing  Cardiovascular: RRR No MGR No LE edema  Respiratory: No wheeze. Scattered rhonchi distant BS  Abdomen: flat soft +BS non-tender  Musculoskeletal: joints without swelling/erythema   Data Reviewed: Basic Metabolic Panel:  Recent Labs Lab 05/01/14 1427 05/02/14 0542 05/03/14 0635  NA 146 148* 142  K 2.8* 4.7 3.4*  CL 103 110 104  CO2 30 28 26   GLUCOSE 109* 110* 90  BUN 25* 22 10  CREATININE 0.70 0.61 0.51  CALCIUM 9.3 8.9 9.0   Liver Function Tests:  Recent Labs Lab 05/01/14 1427 05/02/14 0542  AST 23 26  ALT 22 19  ALKPHOS 97 91  BILITOT 0.6 0.5  PROT 7.7 7.0  ALBUMIN 3.8 3.4*   No results for input(s): LIPASE, AMYLASE in the last 168 hours. No results for input(s): AMMONIA in the last 168 hours. CBC:  Recent Labs Lab 05/01/14 1427 05/02/14 0542 05/03/14 0635  WBC 6.4 4.9 6.7  NEUTROABS 4.5  --   --   HGB 15.3* 13.8 13.6  HCT 45.0 41.5 41.4  MCV 93.9 95.2 95.8  PLT 198 188 164   Cardiac Enzymes: No results for input(s): CKTOTAL, CKMB, CKMBINDEX, TROPONINI in the last 168 hours. BNP (last 3 results)  Recent Labs  09/29/13 1155  PROBNP 582.1*   CBG: No results for input(s): GLUCAP in the last 168 hours.  No results found for this or any previous visit (from the past 240 hour(s)).   Studies: Ct Chest W Contrast  05/02/2014   CLINICAL DATA:  Cough and congestion x1 week. Nausea/vomiting x3 days. Weight loss.  EXAM: CT CHEST,  ABDOMEN, AND PELVIS WITH CONTRAST  TECHNIQUE: Multidetector CT imaging of the chest, abdomen and pelvis was performed following the standard protocol during bolus administration of intravenous contrast.  CONTRAST:  139mL OMNIPAQUE IOHEXOL 300 MG/ML  SOLN  COMPARISON:  CT abdomen/pelvis dated 12/19/2013. CT chest dated 07/27/2010.  FINDINGS: CT CHEST FINDINGS  Mild patchy right lower lobe opacity, suspicious for pneumonia.  Bronchiectasis with  associated tree-in-bud opacities in the posterior right upper lobe (series 7/ image 30). Additional bronchiectasis in the right middle lobe (series 7/ image 37) and to a lesser extent in the lingula (series 7/ image 47). These findings are most suggestive of chronic atypical mycobacterial infection such as MAI.  No suspicious pulmonary nodules. No pleural effusion or pneumothorax.  Visualized thyroid is unremarkable.  Cardiomegaly. No pericardial effusion. Coronary atherosclerosis. Atherosclerotic calcifications of the aortic arch. Ectasia of the ascending thoracic aorta, measuring 3.6 cm.  Small mediastinal lymph nodes which do not meet pathologic CT size criteria.  Mild degenerative changes of the thoracic spine.  CT ABDOMEN AND PELVIS FINDINGS  Hepatobiliary: 5 mm probable cyst in the lateral segment left hepatic lobe (series 2/image 60). Liver is otherwise within normal limits.  Gallbladder is unremarkable. No intrahepatic or extrahepatic ductal dilatation.  Pancreas: Fatty atrophy.  Spleen: Within normal limits.  Adrenals/Urinary Tract: Adrenal glands are unremarkable.  2.2 x 3.7 cm posterior right upper pole renal cyst (series 2/ image 61). 5.9 x 6.0 cm posterior left upper pole renal cyst (series 2/ image 25). No hydronephrosis.  Bladder is mildly thick-walled but underdistended.  Stomach/Bowel: Stomach is unremarkable.  No evidence of bowel obstruction.  Eccentric soft tissue along the proximal transverse colon (series 2/image 84; series 3/image 27), new from recent CTA, likely reflecting stool/debris rather than a true mass.  Sigmoid diverticulosis with chronic mucosal hypertrophy.  Vascular/Lymphatic: Extensive atherosclerotic calcifications of the abdominal aorta and branch vessels.  No suspicious abdominopelvic lymphadenopathy.  Reproductive: Status post hysterectomy.  No adnexal masses.  Other: Mesenteric fluid/ ascites, most prominent in the upper abdomen) series 2/image 57).  Musculoskeletal: Mild  degenerative changes of the lumbar spine.  IMPRESSION: Mild patchy right lower lobe opacity, suspicious for pneumonia.  Scattered areas of bronchiectasis with tree-in-bud nodular opacities, likely reflecting chronic atypical mycobacterial infection such as MAI.  Eccentric soft tissue along the proximal transverse colon is favored to reflect stool/debris rather than a true lesion. Consider colonoscopy for further evaluation as clinically warranted.  Upper abdominal/mesenteric ascites.  Additional ancillary findings as above.   Electronically Signed   By: Julian Hy M.D.   On: 05/02/2014 20:46   Ct Abdomen Pelvis W Contrast  05/02/2014   CLINICAL DATA:  Cough and congestion x1 week. Nausea/vomiting x3 days. Weight loss.  EXAM: CT CHEST, ABDOMEN, AND PELVIS WITH CONTRAST  TECHNIQUE: Multidetector CT imaging of the chest, abdomen and pelvis was performed following the standard protocol during bolus administration of intravenous contrast.  CONTRAST:  123mL OMNIPAQUE IOHEXOL 300 MG/ML  SOLN  COMPARISON:  CT abdomen/pelvis dated 12/19/2013. CT chest dated 07/27/2010.  FINDINGS: CT CHEST FINDINGS  Mild patchy right lower lobe opacity, suspicious for pneumonia.  Bronchiectasis with associated tree-in-bud opacities in the posterior right upper lobe (series 7/ image 30). Additional bronchiectasis in the right middle lobe (series 7/ image 37) and to a lesser extent in the lingula (series 7/ image 47). These findings are most suggestive of chronic atypical mycobacterial infection such as MAI.  No suspicious pulmonary nodules. No pleural effusion or pneumothorax.  Visualized  thyroid is unremarkable.  Cardiomegaly. No pericardial effusion. Coronary atherosclerosis. Atherosclerotic calcifications of the aortic arch. Ectasia of the ascending thoracic aorta, measuring 3.6 cm.  Small mediastinal lymph nodes which do not meet pathologic CT size criteria.  Mild degenerative changes of the thoracic spine.  CT ABDOMEN AND PELVIS  FINDINGS  Hepatobiliary: 5 mm probable cyst in the lateral segment left hepatic lobe (series 2/image 60). Liver is otherwise within normal limits.  Gallbladder is unremarkable. No intrahepatic or extrahepatic ductal dilatation.  Pancreas: Fatty atrophy.  Spleen: Within normal limits.  Adrenals/Urinary Tract: Adrenal glands are unremarkable.  2.2 x 3.7 cm posterior right upper pole renal cyst (series 2/ image 61). 5.9 x 6.0 cm posterior left upper pole renal cyst (series 2/ image 25). No hydronephrosis.  Bladder is mildly thick-walled but underdistended.  Stomach/Bowel: Stomach is unremarkable.  No evidence of bowel obstruction.  Eccentric soft tissue along the proximal transverse colon (series 2/image 84; series 3/image 27), new from recent CTA, likely reflecting stool/debris rather than a true mass.  Sigmoid diverticulosis with chronic mucosal hypertrophy.  Vascular/Lymphatic: Extensive atherosclerotic calcifications of the abdominal aorta and branch vessels.  No suspicious abdominopelvic lymphadenopathy.  Reproductive: Status post hysterectomy.  No adnexal masses.  Other: Mesenteric fluid/ ascites, most prominent in the upper abdomen) series 2/image 57).  Musculoskeletal: Mild degenerative changes of the lumbar spine.  IMPRESSION: Mild patchy right lower lobe opacity, suspicious for pneumonia.  Scattered areas of bronchiectasis with tree-in-bud nodular opacities, likely reflecting chronic atypical mycobacterial infection such as MAI.  Eccentric soft tissue along the proximal transverse colon is favored to reflect stool/debris rather than a true lesion. Consider colonoscopy for further evaluation as clinically warranted.  Upper abdominal/mesenteric ascites.  Additional ancillary findings as above.   Electronically Signed   By: Julian Hy M.D.   On: 05/02/2014 20:46    Scheduled Meds: . antiseptic oral rinse  7 mL Mouth Rinse BID  . atorvastatin  80 mg Oral QHS  . donepezil  5 mg Oral QHS  . dronabinol   2.5 mg Oral Daily  . feeding supplement (PRO-STAT SUGAR FREE 64)  30 mL Oral TID WC  . feeding supplement (RESOURCE BREEZE)  1 Container Oral TID BM  . gabapentin  300 mg Oral QHS  . heparin  5,000 Units Subcutaneous 3 times per day  . isosorbide mononitrate  60 mg Oral Daily  . levofloxacin (LEVAQUIN) IV  750 mg Intravenous Q24H  . losartan  50 mg Oral Daily  . metoCLOPramide  5 mg Oral TID AC  . metoprolol tartrate  25 mg Oral BID  . mirtazapine  30 mg Oral QHS  . mometasone-formoterol  2 puff Inhalation BID  . morphine  30 mg Oral QHS  . pantoprazole  40 mg Oral Daily  . potassium chloride  40 mEq Oral Once  . sodium chloride  3 mL Intravenous Q12H  . tiotropium  18 mcg Inhalation Daily   Continuous Infusions: . dextrose 5 % and 0.45% NaCl 75 mL/hr at 05/03/14 9150    Principal Problem:   COPD exacerbation Active Problems:   HYPERTENSION, BENIGN ESSENTIAL   WEIGHT LOSS   Protein-calorie malnutrition, severe   Gastroenteritis   Tobacco abuse   Failure to thrive (0-17)   Nausea & vomiting   Pulmonary MAI (mycobacterium avium-intracellulare) infection    Time spent: 35 minutes    [facility] Pager [phone]. If 7PM-7AM, please contact night-coverage at [email or web address], password [facility] 05/03/2014, 6:27 PM  LOS: 2  days

## 2014-05-04 MED ORDER — POTASSIUM CHLORIDE CRYS ER 20 MEQ PO TBCR: 40.0000 meq | EXTENDED_RELEASE_TABLET | Freq: Once | ORAL | Status: DC

## 2014-05-04 MED ORDER — BISACODYL 5 MG PO TBEC: 10.0000 mg | DELAYED_RELEASE_TABLET | Freq: Once | ORAL | Status: DC

## 2014-05-04 MED ORDER — POLYETHYLENE GLYCOL 3350 17 G PO PACK
17.0000 g | PACK | Freq: Every day | ORAL | Status: DC
  Administered 2014-05-05: 17 g via ORAL
  Filled 2014-05-04 (×2): qty 1

## 2014-05-04 NOTE — Progress Notes (Signed)
Subjective: She is much better today. She is more alert and was eating better.  Objective: Vital signs in last 24 hours: Temp:  [97.6 F (36.4 C)-100.2 F (37.9 C)] 98.4 F (36.9 C) (11/08 0530) Pulse Rate:  [65-70] 70 (11/08 0530) Resp:  [16-18] 17 (11/08 0530) BP: (127-169)/(53-86) 157/86 mmHg (11/08 0530) SpO2:  [91 %-95 %] 94 % (11/08 0719) Weight change:  Last BM Date: 05/03/14  Intake/Output from previous day: 11/07 0701 - 11/08 0700 In: 367.5 [P.O.:120; I.V.:247.5] Out: 2580 [Urine:2530; Stool:50]  PHYSICAL EXAM General appearance: alert, cooperative and mild distress Resp: rhonchi bilaterally Cardio: regular rate and rhythm, S1, S2 normal, no murmur, click, rub or gallop GI: soft, non-tender; bowel sounds normal; no masses,  no organomegaly Extremities: extremities normal, atraumatic, no cyanosis or edema  Lab Results:  Results for orders placed or performed during the hospital encounter of 05/01/14 (from the past 48 hour(s))  CBC     Status: None   Collection Time: 05/03/14  6:35 AM  Result Value Ref Range   WBC 6.7 4.0 - 10.5 K/uL   RBC 4.32 3.87 - 5.11 MIL/uL   Hemoglobin 13.6 12.0 - 15.0 g/dL   HCT 12.2 48.2 - 50.0 %   MCV 95.8 78.0 - 100.0 fL   MCH 31.5 26.0 - 34.0 pg   MCHC 32.9 30.0 - 36.0 g/dL   RDW 37.0 48.8 - 89.1 %   Platelets 164 150 - 400 K/uL  Basic metabolic panel     Status: Abnormal   Collection Time: 05/03/14  6:35 AM  Result Value Ref Range   Sodium 142 137 - 147 mEq/L   Potassium 3.4 (L) 3.7 - 5.3 mEq/L    Comment: DELTA CHECK NOTED   Chloride 104 96 - 112 mEq/L   CO2 26 19 - 32 mEq/L   Glucose, Bld 90 70 - 99 mg/dL   BUN 10 6 - 23 mg/dL   Creatinine, Ser 6.94 0.50 - 1.10 mg/dL   Calcium 9.0 8.4 - 50.3 mg/dL   GFR calc non Af Amer >90 >90 mL/min   GFR calc Af Amer >90 >90 mL/min    Comment: (NOTE) The eGFR has been calculated using the CKD EPI equation. This calculation has not been validated in all clinical situations. eGFR's  persistently <90 mL/min signify possible Chronic Kidney Disease.    Anion gap 12 5 - 15    ABGS No results for input(s): PHART, PO2ART, TCO2, HCO3 in the last 72 hours.  Invalid input(s): PCO2 CULTURES No results found for this or any previous visit (from the past 240 hour(s)). Studies/Results: Ct Chest W Contrast  05/02/2014   CLINICAL DATA:  Cough and congestion x1 week. Nausea/vomiting x3 days. Weight loss.  EXAM: CT CHEST, ABDOMEN, AND PELVIS WITH CONTRAST  TECHNIQUE: Multidetector CT imaging of the chest, abdomen and pelvis was performed following the standard protocol during bolus administration of intravenous contrast.  CONTRAST:  OMNIPAQUE IOHEXOL 300 MG/ML  SOLN  COMPARISON:  CT abdomen/pelvis dated 12/19/2013. CT chest dated 07/27/2010.  FINDINGS: CT CHEST FINDINGS  Mild patchy right lower lobe opacity, suspicious for pneumonia.  Bronchiectasis with associated tree-in-bud opacities in the posterior right upper lobe (series 7/ image 30). Additional bronchiectasis in the right middle lobe (series 7/ image 37) and to a lesser extent in the lingula (series 7/ image 47). These findings are most suggestive of chronic atypical mycobacterial infection such as MAI.  No suspicious pulmonary nodules. No pleural effusion or pneumothorax.  Visualized  thyroid is unremarkable.  Cardiomegaly. No pericardial effusion. Coronary atherosclerosis. Atherosclerotic calcifications of the aortic arch. Ectasia of the ascending thoracic aorta, measuring 3.6 cm.  Small mediastinal lymph nodes which do not meet pathologic CT size criteria.  Mild degenerative changes of the thoracic spine.  CT ABDOMEN AND PELVIS FINDINGS  Hepatobiliary: 5 mm probable cyst in the lateral segment left hepatic lobe (series 2/image 60). Liver is otherwise within normal limits.  Gallbladder is unremarkable. No intrahepatic or extrahepatic ductal dilatation.  Pancreas: Fatty atrophy.  Spleen: Within normal limits.  Adrenals/Urinary Tract:  Adrenal glands are unremarkable.  2.2 x 3.7 cm posterior right upper pole renal cyst (series 2/ image 61). 5.9 x 6.0 cm posterior left upper pole renal cyst (series 2/ image 25). No hydronephrosis.  Bladder is mildly thick-walled but underdistended.  Stomach/Bowel: Stomach is unremarkable.  No evidence of bowel obstruction.  Eccentric soft tissue along the proximal transverse colon (series 2/image 84; series 3/image 27), new from recent CTA, likely reflecting stool/debris rather than a true mass.  Sigmoid diverticulosis with chronic mucosal hypertrophy.  Vascular/Lymphatic: Extensive atherosclerotic calcifications of the abdominal aorta and branch vessels.  No suspicious abdominopelvic lymphadenopathy.  Reproductive: Status post hysterectomy.  No adnexal masses.  Other: Mesenteric fluid/ ascites, most prominent in the upper abdomen) series 2/image 57).  Musculoskeletal: Mild degenerative changes of the lumbar spine.  IMPRESSION: Mild patchy right lower lobe opacity, suspicious for pneumonia.  Scattered areas of bronchiectasis with tree-in-bud nodular opacities, likely reflecting chronic atypical mycobacterial infection such as MAI.  Eccentric soft tissue along the proximal transverse colon is favored to reflect stool/debris rather than a true lesion. Consider colonoscopy for further evaluation as clinically warranted.  Upper abdominal/mesenteric ascites.  Additional ancillary findings as above.   Electronically Signed   By: Julian Hy M.D.   On: 05/02/2014 20:46   Ct Abdomen Pelvis W Contrast  05/02/2014   CLINICAL DATA:  Cough and congestion x1 week. Nausea/vomiting x3 days. Weight loss.  EXAM: CT CHEST, ABDOMEN, AND PELVIS WITH CONTRAST  TECHNIQUE: Multidetector CT imaging of the chest, abdomen and pelvis was performed following the standard protocol during bolus administration of intravenous contrast.  CONTRAST:  136mL OMNIPAQUE IOHEXOL 300 MG/ML  SOLN  COMPARISON:  CT abdomen/pelvis dated 12/19/2013.  CT chest dated 07/27/2010.  FINDINGS: CT CHEST FINDINGS  Mild patchy right lower lobe opacity, suspicious for pneumonia.  Bronchiectasis with associated tree-in-bud opacities in the posterior right upper lobe (series 7/ image 30). Additional bronchiectasis in the right middle lobe (series 7/ image 37) and to a lesser extent in the lingula (series 7/ image 47). These findings are most suggestive of chronic atypical mycobacterial infection such as MAI.  No suspicious pulmonary nodules. No pleural effusion or pneumothorax.  Visualized thyroid is unremarkable.  Cardiomegaly. No pericardial effusion. Coronary atherosclerosis. Atherosclerotic calcifications of the aortic arch. Ectasia of the ascending thoracic aorta, measuring 3.6 cm.  Small mediastinal lymph nodes which do not meet pathologic CT size criteria.  Mild degenerative changes of the thoracic spine.  CT ABDOMEN AND PELVIS FINDINGS  Hepatobiliary: 5 mm probable cyst in the lateral segment left hepatic lobe (series 2/image 60). Liver is otherwise within normal limits.  Gallbladder is unremarkable. No intrahepatic or extrahepatic ductal dilatation.  Pancreas: Fatty atrophy.  Spleen: Within normal limits.  Adrenals/Urinary Tract: Adrenal glands are unremarkable.  2.2 x 3.7 cm posterior right upper pole renal cyst (series 2/ image 61). 5.9 x 6.0 cm posterior left upper pole renal cyst (series 2/ image  25). No hydronephrosis.  Bladder is mildly thick-walled but underdistended.  Stomach/Bowel: Stomach is unremarkable.  No evidence of bowel obstruction.  Eccentric soft tissue along the proximal transverse colon (series 2/image 84; series 3/image 27), new from recent CTA, likely reflecting stool/debris rather than a true mass.  Sigmoid diverticulosis with chronic mucosal hypertrophy.  Vascular/Lymphatic: Extensive atherosclerotic calcifications of the abdominal aorta and branch vessels.  No suspicious abdominopelvic lymphadenopathy.  Reproductive: Status post  hysterectomy.  No adnexal masses.  Other: Mesenteric fluid/ ascites, most prominent in the upper abdomen) series 2/image 57).  Musculoskeletal: Mild degenerative changes of the lumbar spine.  IMPRESSION: Mild patchy right lower lobe opacity, suspicious for pneumonia.  Scattered areas of bronchiectasis with tree-in-bud nodular opacities, likely reflecting chronic atypical mycobacterial infection such as MAI.  Eccentric soft tissue along the proximal transverse colon is favored to reflect stool/debris rather than a true lesion. Consider colonoscopy for further evaluation as clinically warranted.  Upper abdominal/mesenteric ascites.  Additional ancillary findings as above.   Electronically Signed   By: Julian Hy M.D.   On: 05/02/2014 20:46    Medications:  Prior to Admission:  Prescriptions prior to admission  Medication Sig Dispense Refill Last Dose  . acetaminophen (TYLENOL) 325 MG tablet Take 650 mg by mouth every 6 (six) hours as needed for headache.    Past Week at Unknown time  . albuterol (PROAIR HFA) 108 (90 BASE) MCG/ACT inhaler Inhale 2 puffs into the lungs every 4 (four) hours as needed for wheezing. 1 Inhaler 6 04/30/2014 at Unknown time  . ALPRAZolam (XANAX) 0.5 MG tablet TAKE 1 TABLET BY MOUTH TWICE A DAY AS NEEDED 60 tablet 1 04/30/2014 at Unknown time  . atorvastatin (LIPITOR) 80 MG tablet Take 1 tablet (80 mg total) by mouth at bedtime. MUST KEEP APPOINTMENT FOR FUTURE REFILLS. 15 tablet 0 04/30/2014 at Unknown time  . diphenoxylate-atropine (LOMOTIL) 2.5-0.025 MG per tablet Take 1 tablet by mouth 4 (four) times daily as needed for diarrhea or loose stools. 20 tablet 0 Past Month at Unknown time  . donepezil (ARICEPT) 5 MG tablet Take 1 tablet (5 mg total) by mouth at bedtime. 30 tablet 11 04/30/2014 at Unknown time  . dronabinol (MARINOL) 2.5 MG capsule Take 1 capsule (2.5 mg total) by mouth daily. 30 capsule 0 04/30/2014 at Unknown time  . fluticasone (FLONASE) 50 MCG/ACT nasal spray  PLACE 2 SPRAYS INTO BOTH NOSTRILS DAILY. 16 g 11 04/30/2014 at Unknown time  . fluticasone-salmeterol (ADVAIR HFA) 45-21 MCG/ACT inhaler Inhale 2 puffs into the lungs 2 (two) times daily as needed (wheezing and shortness of breath). 1 Inhaler 5 04/30/2014 at Unknown time  . FLUZONE HIGH-DOSE 0.5 ML SUSY Inject 0.5 mLs as directed once.  0 Sept.  . furosemide (LASIX) 20 MG tablet Take 1 tablet (20 mg total) by mouth daily. (Patient taking differently: Take 20 mg by mouth daily as needed for fluid. ) 30 tablet 3 unknown at Unknown time  . gabapentin (NEURONTIN) 300 MG capsule TAKE 1 CAPSULE BY MOUTH DAILY AT BEDTIME 30 capsule 5 04/30/2014 at Unknown time  . HYDROmorphone (DILAUDID) 2 MG tablet Take 1 tablet by mouth 4 (four) times daily as needed for moderate pain.   0 05/01/2014 at Unknown time  . isosorbide mononitrate (IMDUR) 60 MG 24 hr tablet Take 1 tablet (60 mg total) by mouth daily. Keep office visit 11/24 in order to get further refills 30 tablet 0 04/30/2014 at Unknown time  . losartan (COZAAR) 50 MG tablet  Take 50 mg by mouth daily.    05/01/2014 at Unknown time  . metoCLOPramide (REGLAN) 5 MG tablet TAKE 1 TABLET BY MOUTH TWICE A DAY 60 tablet 1 05/01/2014 at Unknown time  . metoprolol tartrate (LOPRESSOR) 25 MG tablet Take 0.5 tablets (12.5 mg total) by mouth 2 (two) times daily. 30 tablet 6 05/01/2014 at 0900  . mirtazapine (REMERON) 30 MG tablet Take 1 tablet (30 mg total) by mouth at bedtime. 30 tablet 5 04/30/2014 at Unknown time  . Multiple Vitamins-Minerals (ALIVE WOMENS 50+ PO) Take 1 tablet by mouth daily.   Past Week at Unknown time  . Nutritional Supplements (COMPLETE PROTEIN/VITAMIN SHAKE PO) Take 2-3 Bottles by mouth daily as needed (nutritional supplment).    04/30/2014 at Unknown time  . ondansetron (ZOFRAN) 8 MG tablet Take 1 tablet by mouth 2 (two) times daily as needed for nausea or vomiting.   1 04/30/2014 at Unknown time  . oxyCODONE-acetaminophen (PERCOCET) 10-325 MG per tablet  Take 1 tablet by mouth 5 (five) times daily as needed for pain.   05/01/2014 at Unknown time  . pantoprazole (PROTONIX) 40 MG tablet TAKE 1 TABLET (40 MG TOTAL) BY MOUTH 2 (TWO) TIMES DAILY. 60 tablet 2 05/01/2014 at Unknown time  . promethazine (PHENERGAN) 25 MG tablet Take 1 tablet (25 mg total) by mouth at bedtime. (Patient taking differently: Take 25 mg by mouth daily as needed for nausea or vomiting. ) 30 tablet 0 Past Week at Unknown time  . levofloxacin (LEVAQUIN) 500 MG tablet Take 1 tablet (500 mg total) by mouth daily. (Patient not taking: Reported on 05/01/2014) 7 tablet 0   . morphine (MS CONTIN) 30 MG 12 hr tablet Take 30 mg by mouth at bedtime.    Completed Course at Unknown time  . predniSONE (DELTASONE) 20 MG tablet 3 tabs poqday 1-2, 2 tabs poqday 3-4, 1 tab poqday 5-6 (Patient not taking: Reported on 05/01/2014) 12 tablet 0   . traMADol (ULTRAM) 50 MG tablet 1-2 every six hours as needed for headaches   Taking   Scheduled: . antiseptic oral rinse  7 mL Mouth Rinse BID  . atorvastatin  80 mg Oral QHS  . donepezil  5 mg Oral QHS  . dronabinol  2.5 mg Oral Daily  . feeding supplement (PRO-STAT SUGAR FREE 64)  30 mL Oral TID WC  . feeding supplement (RESOURCE BREEZE)  1 Container Oral TID BM  . gabapentin  300 mg Oral QHS  . heparin  5,000 Units Subcutaneous 3 times per day  . isosorbide mononitrate  60 mg Oral Daily  . levofloxacin (LEVAQUIN) IV  750 mg Intravenous Q24H  . losartan  50 mg Oral Daily  . metoCLOPramide  5 mg Oral TID AC  . metoprolol tartrate  25 mg Oral BID  . mirtazapine  30 mg Oral QHS  . mometasone-formoterol  2 puff Inhalation BID  . morphine  30 mg Oral QHS  . pantoprazole  40 mg Oral Daily  . sodium chloride  3 mL Intravenous Q12H  . tiotropium  18 mcg Inhalation Daily   Continuous: . dextrose 5 % and 0.45% NaCl 75 mL/hr at 05/03/14 2241   VEH:[id number], albuterol, ALPRAZolam, ondansetron **OR** ondansetron (ZOFRAN) IV, oxyCODONE-acetaminophen  **AND** oxyCODONE, promethazine  Assesment:she was admitted with COPD exacerbation. She has lost a great deal of weight and I think she probably does have pulmonary MAI. This will be an outpatient evaluation for this. This morning she is better  Principal Problem:  COPD exacerbation Active Problems:   HYPERTENSION, BENIGN ESSENTIAL   WEIGHT LOSS   Protein-calorie malnutrition, severe   Gastroenteritis   Tobacco abuse   Failure to thrive (0-17)   Nausea & vomiting   Pulmonary MAI (mycobacterium avium-intracellulare) infection    Plan:continue current treatments    LOS: 3 days   Kathy Marshall L 05/04/2014, 11:22 AM  , 11:22 AM

## 2014-05-04 NOTE — Plan of Care (Signed)
Problem: ICU Phase Progression Outcomes Goal: Dyspnea controlled at rest Outcome: Completed/Met Date Met:  05/04/14

## 2014-05-04 NOTE — Plan of Care (Signed)
Problem: Phase I Progression Outcomes Goal: Dyspnea controlled at rest Outcome: Progressing Goal: Hemodynamically stable Outcome: Progressing Goal: Pain controlled Outcome: Progressing Goal: Tolerating diet Outcome: Progressing

## 2014-05-04 NOTE — Plan of Care (Signed)
Problem: ICU Phase Progression Outcomes Goal: Pain controlled with appropriate interventions Outcome: Completed/Met Date Met:  05/04/14

## 2014-05-04 NOTE — Progress Notes (Signed)
[facility] PROGRESS NOTE  Arienne Gartin Baldonado SVX:[id number] DOB: Sep 24, 1934 DOA: 05/01/2014 PCP: Odette Fraction, MD  Assessment/Plan: 1. Gastroenteritis resulting in  dehydration, associated with hypokalemia. Provided with IV fluids and potassium supplementation. Improved quickly. Diet has been advanced. Monitor electrolytes 2. Approximately 80-90 pound weight loss in the last 1 year. CT chest shows possible MAI infection. This could certainly explain her progressive weight loss. Appreciate pulmonology assistance. Plans are for further outpatient evaluation including bronchoscopy and likely initiation of treatment for MAI. CT of her abdomen and pelvis indicates possible lesion in transverse colon. This may be stool/debris. Discussed with Dr. Laural Golden, recommendations are to give the patient laxative today and tomorrow and then repeat CT scan to see if there is any change. 3. Community-acquired pneumonia. CT chest also indicates possible underlying pneumonia. She has been started on Levaquin. 4. COPD with possible exacerbation. Chest xray negative for pna.  She has been weaned down to room air and appears to be approaching her baseline respiratory status. Will continue current treatments. 5. Hypertension. Poor control. Currently on home regimen except for lasix. Will continue to hold this and increase metoprolol with parameters. monitor 6. Protein calorie malnutrition, severe. BMI 14.6. Nutrition consult   Code Status: full Family Communication: grandaughter Disposition Plan: home when ready   Consultants:  pulmonology  Procedures:  none  Antibiotics:  levaquin 11/7>>  HPI/Subjective: Feeling better. Still has a productive cough. No vomiting. Tolerating diet. Feels appetite is improving.  Objective: Filed Vitals:   05/04/14 1514  BP: 149/71  Pulse: 73  Temp: 98 F (36.7 C)  Resp: 18    Intake/Output Summary (Last 24 hours) at 05/04/14 1818 Last data filed at  05/04/14 1449  Gross per 24 hour  Intake 593.75 ml  Output    750 ml  Net -156.25 ml   Filed Weights   05/01/14 1354 05/01/14 1912 05/02/14 0536  Weight: 39.009 kg (86 lb) 36.2 kg (79 lb 12.9 oz) 36.2 kg (79 lb 12.9 oz)    Exam:   General:  Very thin frail chronically ill appearing  Cardiovascular: RRR No MGR No LE edema  Respiratory: No wheeze. Scattered rhonchi distant BS  Abdomen: flat soft +BS non-tender  Musculoskeletal: joints without swelling/erythema   Data Reviewed: Basic Metabolic Panel:  Recent Labs Lab 05/01/14 1427 05/02/14 0542 05/03/14 0635  NA 146 148* 142  K 2.8* 4.7 3.4*  CL 103 110 104  CO2 30 28 26   GLUCOSE 109* 110* 90  BUN 25* 22 10  CREATININE 0.70 0.61 0.51  CALCIUM 9.3 8.9 9.0   Liver Function Tests:  Recent Labs Lab 05/01/14 1427 05/02/14 0542  AST 23 26  ALT 22 19  ALKPHOS 97 91  BILITOT 0.6 0.5  PROT 7.7 7.0  ALBUMIN 3.8 3.4*   No results for input(s): LIPASE, AMYLASE in the last 168 hours. No results for input(s): AMMONIA in the last 168 hours. CBC:  Recent Labs Lab 05/01/14 1427 05/02/14 0542 05/03/14 0635  WBC 6.4 4.9 6.7  NEUTROABS 4.5  --   --   HGB 15.3* 13.8 13.6  HCT 45.0 41.5 41.4  MCV 93.9 95.2 95.8  PLT 198 188 164   Cardiac Enzymes: No results for input(s): CKTOTAL, CKMB, CKMBINDEX, TROPONINI in the last 168 hours. BNP (last 3 results)  Recent Labs  09/29/13 1155  PROBNP 582.1*   CBG: No results for input(s): GLUCAP in the last 168 hours.  No results found for this or any previous visit (from the  past 240 hour(s)).   Studies: Ct Chest W Contrast  05/02/2014   CLINICAL DATA:  Cough and congestion x1 week. Nausea/vomiting x3 days. Weight loss.  EXAM: CT CHEST, ABDOMEN, AND PELVIS WITH CONTRAST  TECHNIQUE: Multidetector CT imaging of the chest, abdomen and pelvis was performed following the standard protocol during bolus administration of intravenous contrast.  CONTRAST:  151mL OMNIPAQUE IOHEXOL  300 MG/ML  SOLN  COMPARISON:  CT abdomen/pelvis dated 12/19/2013. CT chest dated 07/27/2010.  FINDINGS: CT CHEST FINDINGS  Mild patchy right lower lobe opacity, suspicious for pneumonia.  Bronchiectasis with associated tree-in-bud opacities in the posterior right upper lobe (series 7/ image 30). Additional bronchiectasis in the right middle lobe (series 7/ image 37) and to a lesser extent in the lingula (series 7/ image 47). These findings are most suggestive of chronic atypical mycobacterial infection such as MAI.  No suspicious pulmonary nodules. No pleural effusion or pneumothorax.  Visualized thyroid is unremarkable.  Cardiomegaly. No pericardial effusion. Coronary atherosclerosis. Atherosclerotic calcifications of the aortic arch. Ectasia of the ascending thoracic aorta, measuring 3.6 cm.  Small mediastinal lymph nodes which do not meet pathologic CT size criteria.  Mild degenerative changes of the thoracic spine.  CT ABDOMEN AND PELVIS FINDINGS  Hepatobiliary: 5 mm probable cyst in the lateral segment left hepatic lobe (series 2/image 60). Liver is otherwise within normal limits.  Gallbladder is unremarkable. No intrahepatic or extrahepatic ductal dilatation.  Pancreas: Fatty atrophy.  Spleen: Within normal limits.  Adrenals/Urinary Tract: Adrenal glands are unremarkable.  2.2 x 3.7 cm posterior right upper pole renal cyst (series 2/ image 61). 5.9 x 6.0 cm posterior left upper pole renal cyst (series 2/ image 25). No hydronephrosis.  Bladder is mildly thick-walled but underdistended.  Stomach/Bowel: Stomach is unremarkable.  No evidence of bowel obstruction.  Eccentric soft tissue along the proximal transverse colon (series 2/image 84; series 3/image 27), new from recent CTA, likely reflecting stool/debris rather than a true mass.  Sigmoid diverticulosis with chronic mucosal hypertrophy.  Vascular/Lymphatic: Extensive atherosclerotic calcifications of the abdominal aorta and branch vessels.  No suspicious  abdominopelvic lymphadenopathy.  Reproductive: Status post hysterectomy.  No adnexal masses.  Other: Mesenteric fluid/ ascites, most prominent in the upper abdomen) series 2/image 57).  Musculoskeletal: Mild degenerative changes of the lumbar spine.  IMPRESSION: Mild patchy right lower lobe opacity, suspicious for pneumonia.  Scattered areas of bronchiectasis with tree-in-bud nodular opacities, likely reflecting chronic atypical mycobacterial infection such as MAI.  Eccentric soft tissue along the proximal transverse colon is favored to reflect stool/debris rather than a true lesion. Consider colonoscopy for further evaluation as clinically warranted.  Upper abdominal/mesenteric ascites.  Additional ancillary findings as above.   Electronically Signed   By: Julian Hy M.D.   On: 05/02/2014 20:46   Ct Abdomen Pelvis W Contrast  05/02/2014   CLINICAL DATA:  Cough and congestion x1 week. Nausea/vomiting x3 days. Weight loss.  EXAM: CT CHEST, ABDOMEN, AND PELVIS WITH CONTRAST  TECHNIQUE: Multidetector CT imaging of the chest, abdomen and pelvis was performed following the standard protocol during bolus administration of intravenous contrast.  CONTRAST:  146mL OMNIPAQUE IOHEXOL 300 MG/ML  SOLN  COMPARISON:  CT abdomen/pelvis dated 12/19/2013. CT chest dated 07/27/2010.  FINDINGS: CT CHEST FINDINGS  Mild patchy right lower lobe opacity, suspicious for pneumonia.  Bronchiectasis with associated tree-in-bud opacities in the posterior right upper lobe (series 7/ image 30). Additional bronchiectasis in the right middle lobe (series 7/ image 37) and to a lesser extent in  the lingula (series 7/ image 47). These findings are most suggestive of chronic atypical mycobacterial infection such as MAI.  No suspicious pulmonary nodules. No pleural effusion or pneumothorax.  Visualized thyroid is unremarkable.  Cardiomegaly. No pericardial effusion. Coronary atherosclerosis. Atherosclerotic calcifications of the aortic arch.  Ectasia of the ascending thoracic aorta, measuring 3.6 cm.  Small mediastinal lymph nodes which do not meet pathologic CT size criteria.  Mild degenerative changes of the thoracic spine.  CT ABDOMEN AND PELVIS FINDINGS  Hepatobiliary: 5 mm probable cyst in the lateral segment left hepatic lobe (series 2/image 60). Liver is otherwise within normal limits.  Gallbladder is unremarkable. No intrahepatic or extrahepatic ductal dilatation.  Pancreas: Fatty atrophy.  Spleen: Within normal limits.  Adrenals/Urinary Tract: Adrenal glands are unremarkable.  2.2 x 3.7 cm posterior right upper pole renal cyst (series 2/ image 61). 5.9 x 6.0 cm posterior left upper pole renal cyst (series 2/ image 25). No hydronephrosis.  Bladder is mildly thick-walled but underdistended.  Stomach/Bowel: Stomach is unremarkable.  No evidence of bowel obstruction.  Eccentric soft tissue along the proximal transverse colon (series 2/image 84; series 3/image 27), new from recent CTA, likely reflecting stool/debris rather than a true mass.  Sigmoid diverticulosis with chronic mucosal hypertrophy.  Vascular/Lymphatic: Extensive atherosclerotic calcifications of the abdominal aorta and branch vessels.  No suspicious abdominopelvic lymphadenopathy.  Reproductive: Status post hysterectomy.  No adnexal masses.  Other: Mesenteric fluid/ ascites, most prominent in the upper abdomen) series 2/image 57).  Musculoskeletal: Mild degenerative changes of the lumbar spine.  IMPRESSION: Mild patchy right lower lobe opacity, suspicious for pneumonia.  Scattered areas of bronchiectasis with tree-in-bud nodular opacities, likely reflecting chronic atypical mycobacterial infection such as MAI.  Eccentric soft tissue along the proximal transverse colon is favored to reflect stool/debris rather than a true lesion. Consider colonoscopy for further evaluation as clinically warranted.  Upper abdominal/mesenteric ascites.  Additional ancillary findings as above.    Electronically Signed   By: Julian Hy M.D.   On: 05/02/2014 20:46    Scheduled Meds: . antiseptic oral rinse  7 mL Mouth Rinse BID  . atorvastatin  80 mg Oral QHS  . bisacodyl  10 mg Oral Once  . donepezil  5 mg Oral QHS  . dronabinol  2.5 mg Oral Daily  . feeding supplement (PRO-STAT SUGAR FREE 64)  30 mL Oral TID WC  . feeding supplement (RESOURCE BREEZE)  1 Container Oral TID BM  . gabapentin  300 mg Oral QHS  . heparin  5,000 Units Subcutaneous 3 times per day  . isosorbide mononitrate  60 mg Oral Daily  . levofloxacin (LEVAQUIN) IV  750 mg Intravenous Q24H  . losartan  50 mg Oral Daily  . metoCLOPramide  5 mg Oral TID AC  . metoprolol tartrate  25 mg Oral BID  . mirtazapine  30 mg Oral QHS  . mometasone-formoterol  2 puff Inhalation BID  . morphine  30 mg Oral QHS  . pantoprazole  40 mg Oral Daily  . polyethylene glycol  17 g Oral Daily  . potassium chloride  40 mEq Oral Once  . sodium chloride  3 mL Intravenous Q12H  . tiotropium  18 mcg Inhalation Daily   Continuous Infusions: . dextrose 5 % and 0.45% NaCl 75 mL/hr at 05/04/14 1323    Principal Problem:   COPD exacerbation Active Problems:   HYPERTENSION, BENIGN ESSENTIAL   WEIGHT LOSS   Protein-calorie malnutrition, severe   Gastroenteritis   Tobacco abuse  Failure to thrive (0-17)   Nausea & vomiting   Pulmonary MAI (mycobacterium avium-intracellulare) infection    Time spent: 25 minutes    [facility] Pager [phone]. If 7PM-7AM, please contact night-coverage at [email or web address], password [facility] 05/04/2014, 6:18 PM  LOS: 3 days

## 2014-05-05 DIAGNOSIS — A310 Pulmonary mycobacterial infection: Secondary | ICD-10-CM

## 2014-05-05 LAB — BASIC METABOLIC PANEL
Anion gap: 9 (ref 5–15)
BUN: 14 mg/dL (ref 6–23)
CHLORIDE: 108 meq/L (ref 96–112)
CO2: 25 mEq/L (ref 19–32)
Calcium: 8.9 mg/dL (ref 8.4–10.5)
Creatinine, Ser: 0.55 mg/dL (ref 0.50–1.10)
GFR calc Af Amer: >90 mL/min (ref >90)
GFR calc non Af Amer: 88 mL/min — ABNORMAL LOW (ref >90)
GLUCOSE: 91 mg/dL (ref 70–99)
POTASSIUM: 3.6 meq/L — AB (ref 3.7–5.3)
Sodium: 142 mEq/L (ref 137–147)

## 2014-05-05 MED ORDER — POTASSIUM CHLORIDE CRYS ER 20 MEQ PO TBCR
40.0000 meq | EXTENDED_RELEASE_TABLET | Freq: Once | ORAL | Status: AC
Start: 2014-05-05 — End: 2014-05-05
  Administered 2014-05-05: 40 meq via ORAL
  Filled 2014-05-05: qty 2

## 2014-05-05 MED ORDER — LEVOFLOXACIN 750 MG PO TABS
750.0000 mg | ORAL_TABLET | Freq: Every day | ORAL | Status: DC
  Administered 2014-05-05: 750 mg via ORAL
  Filled 2014-05-05: qty 1

## 2014-05-05 MED ORDER — MAGNESIUM CITRATE PO SOLN
1.0000 | Freq: Once | ORAL | Status: AC
  Administered 2014-05-05: 1 via ORAL
  Filled 2014-05-05: qty 296

## 2014-05-05 NOTE — Progress Notes (Signed)
PHARMACIST - PHYSICIAN COMMUNICATION DR:   Hawkins CONCERNING: Antibiotic IV to Oral Route Change Policy  RECOMMENDATION: This patient is receiving Levaquin by the intravenous route.  Based on criteria approved by the Pharmacy and Therapeutics Committee, the antibiotic(s) is/are being converted to the equivalent oral dose form(s).  DESCRIPTION: These criteria include:  Patient being treated for a respiratory tract infection, urinary tract infection, cellulitis or clostridium difficile associated diarrhea if on metronidazole  The patient is not neutropenic and does not exhibit a GI malabsorption state  The patient is eating (either orally or via tube) and/or has been taking other orally administered medications for a least 24 hours  The patient is improving clinically and has a Tmax < 100.5  If you have questions about this conversion, please contact the Pharmacy Department  [x]  ( [phone] )  [facility] []  ( [phone]  [facility]  []  ( [phone]  [facility] []  ( [phone]  [facility]   S. Mare Ludtke, PharmD  

## 2014-05-05 NOTE — Telephone Encounter (Signed)
Rx refill sent to patient pharmacy   

## 2014-05-05 NOTE — Progress Notes (Signed)
[facility] PROGRESS NOTE  Kathy Marshall UTM:[id number] DOB: 1935-06-16 DOA: 05/01/2014 PCP: Odette Fraction, MD  Assessment/Plan: 1. Gastroenteritis resulting in dehydration, associated with hypokalemia. Provided with IV fluids and potassium supplementation. Improved quickly. Diet has been advanced and she is tolerating well.  2. Approximately 80-90 pound weight loss in the last 1 year. CT chest shows possible MAI infection. May explain her progressive weight loss. Appreciate pulmonology assistance. Plans are for further outpatient evaluation including bronchoscopy and likely initiation of treatment for MAI. CT of her abdomen and pelvis indicates possible lesion in transverse colon. This may be stool/debris. Dr. Laural Golden recommendationed laxative and repeat CT scan to see if there is any change. She did not receive laxative 05/04/14. Will provide mag citrate today.  3. Community-acquired pneumonia. CT chest also indicates possible underlying pneumonia. Levaquin day #3. Afebrile and non-toxic appearing 4. COPD with possible exacerbation. oxygen saturation level >90% on room air.  Will continue current treatments. 5. Hypertension. Improved control somewhat. Metoprolol dose increased. Will continue to hold lasix  6. Protein calorie malnutrition, severe. BMI 14.6. Nutrition consult. Appetite improved  Code Status: full Family Communication: none present Disposition Plan: home when ready hopefully tomorrow   Consultants:  Dr Luan Pulling pulmonology  Procedures:  none  Antibiotics:  Levaquin 05/03/14>>  HPI/Subjective: Awake alert. Reports feeling well and verbalizing desire to go home. Reports no BM   Objective: Filed Vitals:   05/05/14 0839  BP: 158/68  Pulse:   Temp:   Resp:     Intake/Output Summary (Last 24 hours) at 05/05/14 0906 Last data filed at 05/04/14 2129  Gross per 24 hour  Intake 1076.75 ml  Output    375 ml  Net 701.75 ml   Filed Weights   05/01/14 1354 05/01/14 1912 05/02/14 0536  Weight: 39.009 kg (86 lb) 36.2 kg (79 lb 12.9 oz) 36.2 kg (79 lb 12.9 oz)    Exam:   General:  Thin somewhat frail. Appears comfortable  Cardiovascular: RRR No MGR no LE edema  Respiratory: normal effort BS coarse no crackles  Abdomen: flat soft +BS non-tender to palpation  Musculoskeletal: MOA joints non-tender   Data Reviewed: Basic Metabolic Panel:  Recent Labs Lab 05/01/14 1427 05/02/14 0542 05/03/14 0635 05/05/14 0600  NA 146 148* 142 142  K 2.8* 4.7 3.4* 3.6*  CL 103 110 104 108  CO2 30 28 26 25   GLUCOSE 109* 110* 90 91  BUN 25* 22 10 14   CREATININE 0.70 0.61 0.51 0.55  CALCIUM 9.3 8.9 9.0 8.9   Liver Function Tests:  Recent Labs Lab 05/01/14 1427 05/02/14 0542  AST 23 26  ALT 22 19  ALKPHOS 97 91  BILITOT 0.6 0.5  PROT 7.7 7.0  ALBUMIN 3.8 3.4*   No results for input(s): LIPASE, AMYLASE in the last 168 hours. No results for input(s): AMMONIA in the last 168 hours. CBC:  Recent Labs Lab 05/01/14 1427 05/02/14 0542 05/03/14 0635  WBC 6.4 4.9 6.7  NEUTROABS 4.5  --   --   HGB 15.3* 13.8 13.6  HCT 45.0 41.5 41.4  MCV 93.9 95.2 95.8  PLT 198 188 164   Cardiac Enzymes: No results for input(s): CKTOTAL, CKMB, CKMBINDEX, TROPONINI in the last 168 hours. BNP (last 3 results)  Recent Labs  09/29/13 1155  PROBNP 582.1*   CBG: No results for input(s): GLUCAP in the last 168 hours.  No results found for this or any previous visit (from the past 240 hour(s)).   Studies: No  results found.  Scheduled Meds: . antiseptic oral rinse  7 mL Mouth Rinse BID  . atorvastatin  80 mg Oral QHS  . bisacodyl  10 mg Oral Once  . donepezil  5 mg Oral QHS  . dronabinol  2.5 mg Oral Daily  . feeding supplement (PRO-STAT SUGAR FREE 64)  30 mL Oral TID WC  . feeding supplement (RESOURCE BREEZE)  1 Container Oral TID BM  . gabapentin  300 mg Oral QHS  . heparin  5,000 Units Subcutaneous 3 times per day  .  isosorbide mononitrate  60 mg Oral Daily  . levofloxacin (LEVAQUIN) IV  750 mg Intravenous Q24H  . losartan  50 mg Oral Daily  . magnesium citrate  1 Bottle Oral Once  . metoCLOPramide  5 mg Oral TID AC  . metoprolol tartrate  25 mg Oral BID  . mirtazapine  30 mg Oral QHS  . mometasone-formoterol  2 puff Inhalation BID  . morphine  30 mg Oral QHS  . pantoprazole  40 mg Oral Daily  . polyethylene glycol  17 g Oral Daily  . potassium chloride  40 mEq Oral Once  . potassium chloride  40 mEq Oral Once  . sodium chloride  3 mL Intravenous Q12H  . tiotropium  18 mcg Inhalation Daily   Continuous Infusions: . dextrose 5 % and 0.45% NaCl 75 mL/hr at 05/05/14 0518    Principal Problem:   COPD exacerbation Active Problems:   HYPERTENSION, BENIGN ESSENTIAL   WEIGHT LOSS   Protein-calorie malnutrition, severe   Gastroenteritis   Tobacco abuse   Failure to thrive (0-17)   Nausea & vomiting   Pulmonary MAI (mycobacterium avium-intracellulare) infection    Time spent: 35 minutes    [facility] Pager [phone]. If 7PM-7AM, please contact night-coverage at [email or web address], password [facility] 05/05/2014, 9:06 AM  LOS: 4 days

## 2014-05-05 NOTE — Progress Notes (Signed)
Subjective: She says she feels better. She has no new complaints. She is eating better.  Objective: Vital signs in last 24 hours: Temp:  [97.9 F (36.6 C)-98.6 F (37 C)] 97.9 F (36.6 C) (11/09 0654) Pulse Rate:  [53-73] 53 (11/09 0654) Resp:  [18-20] 20 (11/09 0654) BP: (149-173)/(66-89) 158/68 mmHg (11/09 0839) SpO2:  [90 %-97 %] 90 % (11/09 0739) Weight change:  Last BM Date: 05/05/14  Intake/Output from previous day: 11/08 0701 - 11/09 0700 In: 1316.8 [P.O.:720; I.V.:596.8] Out: 425 [Urine:425]  PHYSICAL EXAM General appearance: alert, cooperative and moderate distress Resp: clear to auscultation bilaterally Cardio: regular rate and rhythm, S1, S2 normal, no murmur, click, rub or gallop GI: soft, non-tender; bowel sounds normal; no masses,  no organomegaly Extremities: extremities normal, atraumatic, no cyanosis or edema  Lab Results:  Results for orders placed or performed during the hospital encounter of 05/01/14 (from the past 48 hour(s))  Basic metabolic panel     Status: Abnormal   Collection Time: 05/05/14  6:00 AM  Result Value Ref Range   Sodium 142 137 - 147 mEq/L   Potassium 3.6 (L) 3.7 - 5.3 mEq/L   Chloride 108 96 - 112 mEq/L   CO2 25 19 - 32 mEq/L   Glucose, Bld 91 70 - 99 mg/dL   BUN 14 6 - 23 mg/dL   Creatinine, Ser 0.55 0.50 - 1.10 mg/dL   Calcium 8.9 8.4 - 10.5 mg/dL   GFR calc non Af Amer 88 (L) >90 mL/min   GFR calc Af Amer >90 >90 mL/min    Comment: (NOTE) The eGFR has been calculated using the CKD EPI equation. This calculation has not been validated in all clinical situations. eGFR's persistently <90 mL/min signify possible Chronic Kidney Disease.    Anion gap 9 5 - 15    ABGS No results for input(s): PHART, PO2ART, TCO2, HCO3 in the last 72 hours.  Invalid input(s): PCO2 CULTURES No results found for this or any previous visit (from the past 240 hour(s)). Studies/Results: No results found.  Medications:  Prior to Admission:   Prescriptions prior to admission  Medication Sig Dispense Refill Last Dose  . acetaminophen (TYLENOL) 325 MG tablet Take 650 mg by mouth every 6 (six) hours as needed for headache.    Past Week at Unknown time  . albuterol (PROAIR HFA) 108 (90 BASE) MCG/ACT inhaler Inhale 2 puffs into the lungs every 4 (four) hours as needed for wheezing. 1 Inhaler 6 04/30/2014 at Unknown time  . ALPRAZolam (XANAX) 0.5 MG tablet TAKE 1 TABLET BY MOUTH TWICE A DAY AS NEEDED 60 tablet 1 04/30/2014 at Unknown time  . atorvastatin (LIPITOR) 80 MG tablet Take 1 tablet (80 mg total) by mouth at bedtime. MUST KEEP APPOINTMENT FOR FUTURE REFILLS. 15 tablet 0 04/30/2014 at Unknown time  . diphenoxylate-atropine (LOMOTIL) 2.5-0.025 MG per tablet Take 1 tablet by mouth 4 (four) times daily as needed for diarrhea or loose stools. 20 tablet 0 Past Month at Unknown time  . donepezil (ARICEPT) 5 MG tablet Take 1 tablet (5 mg total) by mouth at bedtime. 30 tablet 11 04/30/2014 at Unknown time  . dronabinol (MARINOL) 2.5 MG capsule Take 1 capsule (2.5 mg total) by mouth daily. 30 capsule 0 04/30/2014 at Unknown time  . fluticasone (FLONASE) 50 MCG/ACT nasal spray PLACE 2 SPRAYS INTO BOTH NOSTRILS DAILY. 16 g 11 04/30/2014 at Unknown time  . fluticasone-salmeterol (ADVAIR HFA) 45-21 MCG/ACT inhaler Inhale 2 puffs into the lungs 2 (  two) times daily as needed (wheezing and shortness of breath). 1 Inhaler 5 04/30/2014 at Unknown time  . FLUZONE HIGH-DOSE 0.5 ML SUSY Inject 0.5 mLs as directed once.  0 Sept.  . furosemide (LASIX) 20 MG tablet Take 1 tablet (20 mg total) by mouth daily. (Patient taking differently: Take 20 mg by mouth daily as needed for fluid. ) 30 tablet 3 unknown at Unknown time  . gabapentin (NEURONTIN) 300 MG capsule TAKE 1 CAPSULE BY MOUTH DAILY AT BEDTIME 30 capsule 5 04/30/2014 at Unknown time  . HYDROmorphone (DILAUDID) 2 MG tablet Take 1 tablet by mouth 4 (four) times daily as needed for moderate pain.   0 05/01/2014 at  Unknown time  . isosorbide mononitrate (IMDUR) 60 MG 24 hr tablet Take 1 tablet (60 mg total) by mouth daily. Keep office visit 11/24 in order to get further refills 30 tablet 0 04/30/2014 at Unknown time  . losartan (COZAAR) 50 MG tablet Take 50 mg by mouth daily.    05/01/2014 at Unknown time  . metoCLOPramide (REGLAN) 5 MG tablet TAKE 1 TABLET BY MOUTH TWICE A DAY 60 tablet 1 05/01/2014 at Unknown time  . metoprolol tartrate (LOPRESSOR) 25 MG tablet Take 0.5 tablets (12.5 mg total) by mouth 2 (two) times daily. 30 tablet 6 05/01/2014 at 0900  . mirtazapine (REMERON) 30 MG tablet Take 1 tablet (30 mg total) by mouth at bedtime. 30 tablet 5 04/30/2014 at Unknown time  . Multiple Vitamins-Minerals (ALIVE WOMENS 50+ PO) Take 1 tablet by mouth daily.   Past Week at Unknown time  . Nutritional Supplements (COMPLETE PROTEIN/VITAMIN SHAKE PO) Take 2-3 Bottles by mouth daily as needed (nutritional supplment).    04/30/2014 at Unknown time  . ondansetron (ZOFRAN) 8 MG tablet Take 1 tablet by mouth 2 (two) times daily as needed for nausea or vomiting.   1 04/30/2014 at Unknown time  . oxyCODONE-acetaminophen (PERCOCET) 10-325 MG per tablet Take 1 tablet by mouth 5 (five) times daily as needed for pain.   05/01/2014 at Unknown time  . pantoprazole (PROTONIX) 40 MG tablet TAKE 1 TABLET (40 MG TOTAL) BY MOUTH 2 (TWO) TIMES DAILY. 60 tablet 2 05/01/2014 at Unknown time  . promethazine (PHENERGAN) 25 MG tablet Take 1 tablet (25 mg total) by mouth at bedtime. (Patient taking differently: Take 25 mg by mouth daily as needed for nausea or vomiting. ) 30 tablet 0 Past Week at Unknown time  . levofloxacin (LEVAQUIN) 500 MG tablet Take 1 tablet (500 mg total) by mouth daily. (Patient not taking: Reported on 05/01/2014) 7 tablet 0   . morphine (MS CONTIN) 30 MG 12 hr tablet Take 30 mg by mouth at bedtime.    Completed Course at Unknown time  . predniSONE (DELTASONE) 20 MG tablet 3 tabs poqday 1-2, 2 tabs poqday 3-4, 1 tab poqday  5-6 (Patient not taking: Reported on 05/01/2014) 12 tablet 0   . traMADol (ULTRAM) 50 MG tablet 1-2 every six hours as needed for headaches   Taking   Scheduled: . antiseptic oral rinse  7 mL Mouth Rinse BID  . atorvastatin  80 mg Oral QHS  . bisacodyl  10 mg Oral Once  . donepezil  5 mg Oral QHS  . dronabinol  2.5 mg Oral Daily  . feeding supplement (PRO-STAT SUGAR FREE 64)  30 mL Oral TID WC  . feeding supplement (RESOURCE BREEZE)  1 Container Oral TID BM  . gabapentin  300 mg Oral QHS  . heparin  5,000 Units Subcutaneous 3 times per day  . isosorbide mononitrate  60 mg Oral Daily  . levofloxacin (LEVAQUIN) IV  750 mg Intravenous Q24H  . losartan  50 mg Oral Daily  . magnesium citrate  1 Bottle Oral Once  . metoCLOPramide  5 mg Oral TID AC  . metoprolol tartrate  25 mg Oral BID  . mirtazapine  30 mg Oral QHS  . mometasone-formoterol  2 puff Inhalation BID  . morphine  30 mg Oral QHS  . pantoprazole  40 mg Oral Daily  . polyethylene glycol  17 g Oral Daily  . potassium chloride  40 mEq Oral Once  . sodium chloride  3 mL Intravenous Q12H  . tiotropium  18 mcg Inhalation Daily   Continuous: . dextrose 5 % and 0.45% NaCl 75 mL/hr at 05/05/14 0518   POI:[id number], albuterol, ALPRAZolam, ondansetron **OR** ondansetron (ZOFRAN) IV, oxyCODONE-acetaminophen **AND** oxyCODONE, promethazine  Assesment:she was admitted with COPD exacerbation and I think she probably has pulmonary MAI in addition. This will need to be worked up as an outpatient  Principal Problem:   COPD exacerbation Active Problems:   HYPERTENSION, BENIGN ESSENTIAL   WEIGHT LOSS   Protein-calorie malnutrition, severe   Gastroenteritis   Tobacco abuse   Failure to thrive (0-17)   Nausea & vomiting   Pulmonary MAI (mycobacterium avium-intracellulare) infection    Plan:I will plan to follow more peripherally but will see her as an outpatient and schedule bronchoscopy    LOS: 4 days   Avanell Banwart  L 05/05/2014, 9:06 AM

## 2014-05-06 ENCOUNTER — Inpatient Hospital Stay (HOSPITAL_COMMUNITY): Payer: Medicare HMO

## 2014-05-06 LAB — BASIC METABOLIC PANEL
Anion gap: 8 (ref 5–15)
BUN: 20 mg/dL (ref 6–23)
CHLORIDE: 108 meq/L (ref 96–112)
CO2: 28 mEq/L (ref 19–32)
Calcium: 8.6 mg/dL (ref 8.4–10.5)
Creatinine, Ser: 0.63 mg/dL (ref 0.50–1.10)
GFR calc Af Amer: >90 mL/min (ref >90)
GFR calc non Af Amer: 84 mL/min — ABNORMAL LOW (ref >90)
GLUCOSE: 76 mg/dL (ref 70–99)
Potassium: 4.2 mEq/L (ref 3.7–5.3)
Sodium: 144 mEq/L (ref 137–147)

## 2014-05-06 MED ORDER — LEVOFLOXACIN 750 MG PO TABS: 750.0000 mg | ORAL_TABLET | Freq: Every day | ORAL | Status: DC

## 2014-05-06 MED ORDER — PROMETHAZINE HCL 25 MG PO TABS: 25.0000 mg | ORAL_TABLET | Freq: Every day | ORAL | Status: DC | PRN

## 2014-05-06 MED ORDER — PRO-STAT SUGAR FREE PO LIQD: 30.0000 mL | Freq: Three times a day (TID) | ORAL | Status: DC

## 2014-05-06 MED ORDER — FUROSEMIDE 20 MG PO TABS: 20.0000 mg | ORAL_TABLET | Freq: Every day | ORAL | Status: DC | PRN

## 2014-05-06 MED ORDER — TIOTROPIUM BROMIDE MONOHYDRATE 18 MCG IN CAPS: 18.0000 ug | ORAL_CAPSULE | Freq: Every day | RESPIRATORY_TRACT | Status: AC

## 2014-05-06 MED ORDER — SODIUM CHLORIDE 0.9 % IJ SOLN
INTRAMUSCULAR | Status: AC
  Filled 2014-05-06: qty 500

## 2014-05-06 MED ORDER — IOHEXOL 300 MG/ML  SOLN
25.0000 mL | Freq: Once | INTRAMUSCULAR | Status: AC | PRN
  Administered 2014-05-06: 50 mL via ORAL

## 2014-05-06 MED ORDER — SODIUM CHLORIDE 0.9 % IJ SOLN
INTRAMUSCULAR | Status: AC
  Administered 2014-05-06: 10 mL
  Filled 2014-05-06: qty 30

## 2014-05-06 MED ORDER — IOHEXOL 300 MG/ML  SOLN
80.0000 mL | Freq: Once | INTRAMUSCULAR | Status: AC | PRN
  Administered 2014-05-06: 80 mL via INTRAVENOUS

## 2014-05-06 MED ORDER — POLYETHYLENE GLYCOL 3350 17 G PO PACK: 17.0000 g | PACK | Freq: Every day | ORAL | Status: DC | PRN

## 2014-05-06 MED ORDER — METOPROLOL TARTRATE 25 MG PO TABS: 25.0000 mg | ORAL_TABLET | Freq: Two times a day (BID) | ORAL | Status: AC

## 2014-05-06 MED ORDER — BOOST / RESOURCE BREEZE PO LIQD: 1.0000 | Freq: Three times a day (TID) | ORAL | Status: DC

## 2014-05-06 NOTE — Progress Notes (Signed)
Pt. Was D/C home in stable condition with daughter.

## 2014-05-06 NOTE — Plan of Care (Signed)
Problem: Phase I Progression Outcomes Goal: O2 sats > or equal 90% or at baseline Outcome: Progressing Goal: Hemodynamically stable Outcome: Progressing Goal: Flu/PneumoVaccines if indicated Outcome: Progressing Goal: Pain controlled Outcome: Progressing Goal: Discharge plan established Outcome: Progressing Goal: Tolerating diet Outcome: Progressing Goal: Patient Should Be Able To Teach Back Prior D/C (Patient should be able to teach back all education and instruction provided prior to D/C including: 1. Maintenance medication administration and usage. 2. Breakthrough medication usage. 3. Importance of home exercise. 4. COPD Action Plan. 5. Willingness to quit smoking annd steps to quit smoking as needed)  Outcome: Progressing

## 2014-05-06 NOTE — Plan of Care (Signed)
Problem: Phase I Progression Outcomes Goal: Dyspnea controlled at rest Outcome: Completed/Met Date Met:  05/06/14

## 2014-05-06 NOTE — Discharge Summary (Signed)
Physician Discharge Summary  Kathy Marshall ZOX:[id number] DOB: 1934/10/12 DOA: 05/01/2014  PCP: Odette Fraction, MD  Admit date: 05/01/2014 Discharge date: 05/06/2014  Time spent: 40 minutes  Recommendations for Outpatient Follow-up:  1. Dr Luan Pulling 1 week for evaluation of respiratory status and resolution of pna 2. PCP in 2 weeks for evaluation of chronic issues 3. [facility] 05/20/14  Discharge Diagnoses:  Principal Problem:   COPD exacerbation Active Problems:   HYPERTENSION, BENIGN ESSENTIAL   WEIGHT LOSS   Protein-calorie malnutrition, severe   Gastroenteritis   Tobacco abuse   Failure to thrive (0-17)   Nausea & vomiting   Pulmonary MAI (mycobacterium avium-intracellulare) infection   Discharge Condition: stable  Diet recommendation: regular  Filed Weights   05/01/14 1354 05/01/14 1912 05/02/14 0536  Weight: 39.009 kg (86 lb) 36.2 kg (79 lb 12.9 oz) 36.2 kg (79 lb 12.9 oz)    History of present illness:  This is a 79 year old lady who presented on 05/01/14 with a 2 to three-day history of diarrhea and vomiting associated with abdominal cramping. When she presented to the emergency room with the symptoms along with weakness, she was noted to be quite severely hypoxic. She really denied any increased dyspnea, cough although she described a low-grade fever. Her main concern was a significant weight loss over the previous year. She stated that she used to weigh around 165 pounds and now she weighs almost half of that. She continued to smoke cigarettes. She was on chronic narcotics on a daily basis.  Hospital Course:  1. Gastroenteritis resulting in dehydration, associated with hypokalemia. Provided with IV fluids and potassium supplementation. Improved quickly. Diet advanced. At discharge tolerating regular diet without issue. .  2. Approximately 80-90 pound weight loss in the last 1 year. CT chest showed possible MAI infection. May explain her progressive weight  loss. Evaluated by pulmonology who recommend further outpatient evaluation including bronchoscopy and likely initiation of treatment for MAI. CT of her abdomen and pelvis indicated possible lesion in transverse colon. Dr Laural Golden opined may be stool and recommended laxative and repeat CT.  Provided with laxative and CT repeated and no lesion noted 3. Community-acquired pneumonia. CT chest also indicated possible underlying pneumonia. Levaquin day #4. She remained afebrile and non-toxic appearing. Follow up with Dr Luan Pulling 1 week 4. COPD with possible exacerbation.stable at baseline at discharge.  5. Hypertension. controlled. Metoprolol dose increased.   6. Protein calorie malnutrition, severe. BMI 14.6. Nutrition consult. Appetite improved   Procedures: onsultations:  Dr Luan Pulling pulmonology  Discharge Exam: Filed Vitals:   05/06/14 0559  BP: 141/75  Pulse: 58  Temp: 98 F (36.7 C)  Resp: 18    General: thin somewhat frail appears well Cardiovascular: RRR No MGR No LE edema Respiratory: normal effort BS clear bilaterally  Discharge Instructions You were cared for by a hospitalist during your hospital stay. If you have any questions about your discharge medications or the care you received while you were in the hospital after you are discharged, you can call the unit and asked to speak with the hospitalist on call if the hospitalist that took care of you is not available. Once you are discharged, your primary care physician will handle any further medical issues. Please note that NO REFILLS for any discharge medications will be authorized once you are discharged, as it is imperative that you return to your primary care physician (or establish a relationship with a primary care physician if you do not have one) for your aftercare  needs so that they can reassess your need for medications and monitor your lab values.  Discharge Instructions    DME Bedside commode    Complete by:  As directed       Diet general    Complete by:  As directed      Face-to-face encounter (required for Medicare/Medicaid patients)    Complete by:  As directed   I MEMON,JEHANZEB certify that this patient is under my care and that I, or a nurse practitioner or physician's assistant working with me, had a face-to-face encounter that meets the physician face-to-face encounter requirements with this patient on 05/06/2014. The encounter with the patient was in whole, or in part for the following medical condition(s) which is the primary reason for home health care (List medical condition): admitted with pneumonia and malnutrition, needs home health RN and PT  The encounter with the patient was in whole, or in part, for the following medical condition, which is the primary reason for home health care:  pneumonia  I certify that, based on my findings, the following services are medically necessary home health services:   NursingPhysical therapy    Reason for Medically Necessary Home Health Services:  Skilled Nursing- Skilled Assessment/Observation  My clinical findings support the need for the above services:  Unable to leave home safely without assistance and/or assistive device  Further, I certify that my clinical findings support that this patient is homebound due to:  Unable to leave home safely without assistance     For home use only DME Walker rolling    Complete by:  As directed      Home Health    Complete by:  As directed   To provide the following care/treatments:   RNPT       Increase activity slowly    Complete by:  As directed           Discharge Medication List as of 05/06/2014  1:58 PM    START taking these medications   Details  Amino Acids-Protein Hydrolys (FEEDING SUPPLEMENT, PRO-STAT SUGAR FREE 64,) LIQD Take 30 mLs by mouth 3 (three) times daily with meals., Starting 05/06/2014, Until Discontinued, Print    feeding supplement, RESOURCE BREEZE, (RESOURCE BREEZE) LIQD Take 1 Container by  mouth 3 (three) times daily between meals., Starting 05/06/2014, Until Discontinued, Print    polyethylene glycol (MIRALAX / GLYCOLAX) packet Take 17 g by mouth daily as needed for mild constipation., Starting 05/06/2014, Until Discontinued, Normal    tiotropium (SPIRIVA) 18 MCG inhalation capsule Place 1 capsule (18 mcg total) into inhaler and inhale daily., Starting 05/06/2014, Until Discontinued, Print      CONTINUE these medications which have CHANGED   Details  furosemide (LASIX) 20 MG tablet Take 1 tablet (20 mg total) by mouth daily as needed for fluid., Starting 05/06/2014, Until Discontinued, No Print    levofloxacin (LEVAQUIN) 750 MG tablet Take 1 tablet (750 mg total) by mouth daily at 6 PM., Starting 05/06/2014, Until Discontinued, Print    metoprolol tartrate (LOPRESSOR) 25 MG tablet Take 1 tablet (25 mg total) by mouth 2 (two) times daily., Starting 05/06/2014, Until Discontinued, Print    promethazine (PHENERGAN) 25 MG tablet Take 1 tablet (25 mg total) by mouth daily as needed for nausea or vomiting., Starting 05/06/2014, Until Discontinued, Normal      CONTINUE these medications which have NOT CHANGED   Details  acetaminophen (TYLENOL) 325 MG tablet Take 650 mg by mouth every 6 (six) hours as  needed for headache. , Until Discontinued, Historical Med    albuterol (PROAIR HFA) 108 (90 BASE) MCG/ACT inhaler Inhale 2 puffs into the lungs every 4 (four) hours as needed for wheezing., Starting 01/14/2013, Until Discontinued, Normal    ALPRAZolam (XANAX) 0.5 MG tablet TAKE 1 TABLET BY MOUTH TWICE A DAY AS NEEDED, Print    atorvastatin (LIPITOR) 80 MG tablet Take 1 tablet (80 mg total) by mouth at bedtime. MUST KEEP APPOINTMENT FOR FUTURE REFILLS., Starting 04/11/2014, Until Discontinued, Normal    diphenoxylate-atropine (LOMOTIL) 2.5-0.025 MG per tablet Take 1 tablet by mouth 4 (four) times daily as needed for diarrhea or loose stools., Starting 11/11/2013, Until Discontinued,  Print    donepezil (ARICEPT) 5 MG tablet Take 1 tablet (5 mg total) by mouth at bedtime., Starting 08/19/2013, Until Discontinued, Normal    dronabinol (MARINOL) 2.5 MG capsule Take 1 capsule (2.5 mg total) by mouth daily., Starting 04/22/2014, Until Discontinued, Print    fluticasone (FLONASE) 50 MCG/ACT nasal spray PLACE 2 SPRAYS INTO BOTH NOSTRILS DAILY., Normal    fluticasone-salmeterol (ADVAIR HFA) 45-21 MCG/ACT inhaler Inhale 2 puffs into the lungs 2 (two) times daily as needed (wheezing and shortness of breath)., Starting 09/16/2013, Until Discontinued, Normal    FLUZONE HIGH-DOSE 0.5 ML SUSY Inject 0.5 mLs as directed once., Starting 03/20/2014, Historical Med    gabapentin (NEURONTIN) 300 MG capsule TAKE 1 CAPSULE BY MOUTH DAILY AT BEDTIME, Normal    HYDROmorphone (DILAUDID) 2 MG tablet Take 1 tablet by mouth 4 (four) times daily as needed for moderate pain. , Starting 03/20/2014, Until Discontinued, Historical Med    isosorbide mononitrate (IMDUR) 60 MG 24 hr tablet Take 1 tablet (60 mg total) by mouth daily. Keep office visit 11/24 in order to get further refills, Starting 04/28/2014, Until Discontinued, Normal    losartan (COZAAR) 50 MG tablet Take 50 mg by mouth daily. , Starting 10/11/2013, Until Discontinued, Historical Med    metoCLOPramide (REGLAN) 5 MG tablet TAKE 1 TABLET BY MOUTH TWICE A DAY, Normal    mirtazapine (REMERON) 30 MG tablet Take 1 tablet (30 mg total) by mouth at bedtime., Starting 01/06/2014, Until Discontinued, Normal    Multiple Vitamins-Minerals (ALIVE WOMENS 50+ PO) Take 1 tablet by mouth daily., Until Discontinued, Historical Med    Nutritional Supplements (COMPLETE PROTEIN/VITAMIN SHAKE PO) Take 2-3 Bottles by mouth daily as needed (nutritional supplment). , Until Discontinued, Historical Med    ondansetron (ZOFRAN) 8 MG tablet Take 1 tablet by mouth 2 (two) times daily as needed for nausea or vomiting. , Starting 04/07/2014, Until Discontinued, Historical  Med    oxyCODONE-acetaminophen (PERCOCET) 10-325 MG per tablet Take 1 tablet by mouth 5 (five) times daily as needed for pain., Until Discontinued, Historical Med    pantoprazole (PROTONIX) 40 MG tablet TAKE 1 TABLET (40 MG TOTAL) BY MOUTH 2 (TWO) TIMES DAILY., Normal    morphine (MS CONTIN) 30 MG 12 hr tablet Take 30 mg by mouth at bedtime. , Starting 09/19/2013, Until Discontinued, Historical Med    traMADol (ULTRAM) 50 MG tablet 1-2 every six hours as needed for headaches, Starting 12/03/2013, Until Discontinued, Historical Med      STOP taking these medications     predniSONE (DELTASONE) 20 MG tablet        No Known Allergies Follow-up Information    Follow up with [facility].   Specialty:  Home Health Services   Why:  They will call you after you are home   Contact information:  [address] [place] [address] [address] [phone]       Follow up with HAWKINS,EDWARD L, MD. Schedule an appointment as soon as possible for a visit in 1 week.   Specialty:  Pulmonary Disease   Contact information:   [address] [place] [address] [phone]       Follow up with [facility] TOM, MD. Schedule an appointment as soon as possible for a visit in 2 weeks.   Specialty:  Family Medicine   Contact information:   [address] [place] [address] [phone]        The results of significant diagnostics from this hospitalization (including imaging, microbiology, ancillary and laboratory) are listed below for reference.    Significant Diagnostic Studies: Ct Chest W Contrast  05/02/2014   CLINICAL DATA:  Cough and congestion x1 week. Nausea/vomiting x3 days. Weight loss.  EXAM: CT CHEST, ABDOMEN, AND PELVIS WITH CONTRAST  TECHNIQUE: Multidetector CT imaging of the chest, abdomen and pelvis was performed following the standard protocol during bolus administration of intravenous contrast.  CONTRAST:  188mL OMNIPAQUE IOHEXOL 300 MG/ML   SOLN  COMPARISON:  CT abdomen/pelvis dated 12/19/2013. CT chest dated 07/27/2010.  FINDINGS: CT CHEST FINDINGS  Mild patchy right lower lobe opacity, suspicious for pneumonia.  Bronchiectasis with associated tree-in-bud opacities in the posterior right upper lobe (series 7/ image 30). Additional bronchiectasis in the right middle lobe (series 7/ image 37) and to a lesser extent in the lingula (series 7/ image 47). These findings are most suggestive of chronic atypical mycobacterial infection such as MAI.  No suspicious pulmonary nodules. No pleural effusion or pneumothorax.  Visualized thyroid is unremarkable.  Cardiomegaly. No pericardial effusion. Coronary atherosclerosis. Atherosclerotic calcifications of the aortic arch. Ectasia of the ascending thoracic aorta, measuring 3.6 cm.  Small mediastinal lymph nodes which do not meet pathologic CT size criteria.  Mild degenerative changes of the thoracic spine.  CT ABDOMEN AND PELVIS FINDINGS  Hepatobiliary: 5 mm probable cyst in the lateral segment left hepatic lobe (series 2/image 60). Liver is otherwise within normal limits.  Gallbladder is unremarkable. No intrahepatic or extrahepatic ductal dilatation.  Pancreas: Fatty atrophy.  Spleen: Within normal limits.  Adrenals/Urinary Tract: Adrenal glands are unremarkable.  2.2 x 3.7 cm posterior right upper pole renal cyst (series 2/ image 61). 5.9 x 6.0 cm posterior left upper pole renal cyst (series 2/ image 25). No hydronephrosis.  Bladder is mildly thick-walled but underdistended.  Stomach/Bowel: Stomach is unremarkable.  No evidence of bowel obstruction.  Eccentric soft tissue along the proximal transverse colon (series 2/image 84; series 3/image 27), new from recent CTA, likely reflecting stool/debris rather than a true mass.  Sigmoid diverticulosis with chronic mucosal hypertrophy.  Vascular/Lymphatic: Extensive atherosclerotic calcifications of the abdominal aorta and branch vessels.  No suspicious abdominopelvic  lymphadenopathy.  Reproductive: Status post hysterectomy.  No adnexal masses.  Other: Mesenteric fluid/ ascites, most prominent in the upper abdomen) series 2/image 57).  Musculoskeletal: Mild degenerative changes of the lumbar spine.  IMPRESSION: Mild patchy right lower lobe opacity, suspicious for pneumonia.  Scattered areas of bronchiectasis with tree-in-bud nodular opacities, likely reflecting chronic atypical mycobacterial infection such as MAI.  Eccentric soft tissue along the proximal transverse colon is favored to reflect stool/debris rather than a true lesion. Consider colonoscopy for further evaluation as clinically warranted.  Upper abdominal/mesenteric ascites.  Additional ancillary findings as above.   Electronically Signed   By: Henderson Newcomer.D.  On: 05/02/2014 20:46   Ct Abdomen Pelvis W Contrast  05/06/2014   CLINICAL DATA:  Gastroenteritis. 80 to 90 pound weight loss for 1 year. Diarrhea with vomiting.  EXAM: CT ABDOMEN AND PELVIS WITH CONTRAST  TECHNIQUE: Multidetector CT imaging of the abdomen and pelvis was performed using the standard protocol following bolus administration of intravenous contrast.  CONTRAST:  2mL OMNIPAQUE IOHEXOL 300 MG/ML SOLN, 7mL OMNIPAQUE IOHEXOL 300 MG/ML SOLN  COMPARISON:  CT abdomen pelvis 05/02/2014 and CT vascular (mesenteric CTA) 12/19/2013). CT abdomen and pelvis 03/07/2013.  FINDINGS: Lung bases: Cardiomegaly with coronary artery vascular calcifications.  Patchy airspace opacities with areas of consolidation in the right lower lobe peripherally. Similar patchy consolidation in the posterior right middle lobe, the visualized right lung base. Focal scarring/ bronchiectasis in the lingula. No visible pleural effusion.  Abdomen/pelvis: 5 mm circumscribed hypodensity in the lateral segment of the left hepatic lobe is stable and statistically most likely a cyst. No new or suspicious hepatic lesions. Extrahepatic common bile duct measures up to 11.5 mm on the  coronal image number 28. This appears unchanged compared to the coronal images of the CT abdomen pelvis performed 03/07/2013. No intrahepatic biliary ductal dilatation. Gallbladder appears within normal limits.  The spleen is normal in size and enhancement.  Stable bilateral upper pole renal cyst. Left renal cyst is the largest, measuring approximately 6 the cm in size. Negative for hydronephrosis. Negative for ureteral dilatation.  The stomach is well distended with fluid and some probable food particles and demonstrates normal wall thickness. No evidence of gastric wall thickening or gastric outlet obstruction  Small bowel loops are normal and wall thickness. No evidence of small-bowel obstruction. Stool scattered throughout the normal caliber colon. The previously described filling defect within the proximal transverse colon on the CT dated 05/02/2014 is less masslike in appearance on today's study and has typical appearances of colonic stool. There is chronic diverticulosis of the sigmoid colon.  Adrenal glands within normal limits. There is some atrophy of the pancreas. No pancreatic mass or inflammatory changes.  Extensive atherosclerotic calcification of the normal caliber abdominal aorta with some stable atherosclerotic calcification of the branch vessels as previously described.  The patient has an overall paucity of mesenteric fat, which can limit the evaluation for mesenteric inflammatory changes.  Previously described mesenteric fluid/ascites on the CT of 05/02/2014 appears decreased on today's study, with only some minimal stranding/haziness noted in the central mesenteric fat on today's examination.  Urinary bladder appears normal. Hysterectomy. No adnexal mass. Negative for lymphadenopathy.  Bones appear osteopenic. The lower thoracic and lumbar spine vertebral bodies are normal in height and alignment. No fracture or suspicious osseous lesion.  Atherosclerotic calcification of the femoral arteries  bilaterally.  IMPRESSION: 1. Airspace disease with consolidation in the right lower lobe and visualized right middle lobe at the lung bases. Findings are similar to recent chest CT performed 05/02/2014 and suspicious for pneumonia, possibly chronic atypical mycobacterial infection, given the findings on recent chest CT. 2. Cardiomegaly. 3. Advanced coronary artery abdominal aorta, and branch vessel atherosclerotic calcification. 4. Interval decrease an mesenteric fluid/edema compared to the abdominal pelvic CT of 05/02/2014. 5. No acute findings are identified in the abdomen or pelvis. 6. Stable bilateral renal cysts. 7. Chronic colonic diverticulosis. 8. Chronic appearing mild prominence of the extrahepatic bile duct.   Electronically Signed   By: Curlene Dolphin M.D.   On: 05/06/2014 11:56   Ct Abdomen Pelvis W Contrast  05/02/2014   CLINICAL DATA:  Cough and congestion x1 week. Nausea/vomiting x3 days. Weight loss.  EXAM: CT CHEST, ABDOMEN, AND PELVIS WITH CONTRAST  TECHNIQUE: Multidetector CT imaging of the chest, abdomen and pelvis was performed following the standard protocol during bolus administration of intravenous contrast.  CONTRAST:  148mL OMNIPAQUE IOHEXOL 300 MG/ML  SOLN  COMPARISON:  CT abdomen/pelvis dated 12/19/2013. CT chest dated 07/27/2010.  FINDINGS: CT CHEST FINDINGS  Mild patchy right lower lobe opacity, suspicious for pneumonia.  Bronchiectasis with associated tree-in-bud opacities in the posterior right upper lobe (series 7/ image 30). Additional bronchiectasis in the right middle lobe (series 7/ image 37) and to a lesser extent in the lingula (series 7/ image 47). These findings are most suggestive of chronic atypical mycobacterial infection such as MAI.  No suspicious pulmonary nodules. No pleural effusion or pneumothorax.  Visualized thyroid is unremarkable.  Cardiomegaly. No pericardial effusion. Coronary atherosclerosis. Atherosclerotic calcifications of the aortic arch. Ectasia of the  ascending thoracic aorta, measuring 3.6 cm.  Small mediastinal lymph nodes which do not meet pathologic CT size criteria.  Mild degenerative changes of the thoracic spine.  CT ABDOMEN AND PELVIS FINDINGS  Hepatobiliary: 5 mm probable cyst in the lateral segment left hepatic lobe (series 2/image 60). Liver is otherwise within normal limits.  Gallbladder is unremarkable. No intrahepatic or extrahepatic ductal dilatation.  Pancreas: Fatty atrophy.  Spleen: Within normal limits.  Adrenals/Urinary Tract: Adrenal glands are unremarkable.  2.2 x 3.7 cm posterior right upper pole renal cyst (series 2/ image 61). 5.9 x 6.0 cm posterior left upper pole renal cyst (series 2/ image 25). No hydronephrosis.  Bladder is mildly thick-walled but underdistended.  Stomach/Bowel: Stomach is unremarkable.  No evidence of bowel obstruction.  Eccentric soft tissue along the proximal transverse colon (series 2/image 84; series 3/image 27), new from recent CTA, likely reflecting stool/debris rather than a true mass.  Sigmoid diverticulosis with chronic mucosal hypertrophy.  Vascular/Lymphatic: Extensive atherosclerotic calcifications of the abdominal aorta and branch vessels.  No suspicious abdominopelvic lymphadenopathy.  Reproductive: Status post hysterectomy.  No adnexal masses.  Other: Mesenteric fluid/ ascites, most prominent in the upper abdomen) series 2/image 57).  Musculoskeletal: Mild degenerative changes of the lumbar spine.  IMPRESSION: Mild patchy right lower lobe opacity, suspicious for pneumonia.  Scattered areas of bronchiectasis with tree-in-bud nodular opacities, likely reflecting chronic atypical mycobacterial infection such as MAI.  Eccentric soft tissue along the proximal transverse colon is favored to reflect stool/debris rather than a true lesion. Consider colonoscopy for further evaluation as clinically warranted.  Upper abdominal/mesenteric ascites.  Additional ancillary findings as above.   Electronically Signed    By: Julian Hy M.D.   On: 05/02/2014 20:46   Dg Chest Portable 1 View  05/01/2014   CLINICAL DATA:  Dyspnea.  Smoker.  EXAM: PORTABLE CHEST - 1 VIEW  COMPARISON:  09/29/2013  FINDINGS: COPD. Mild cardiac enlargement without heart failure. Aorta is uncoiled.  Negative for pneumonia or mass lesion.  IMPRESSION: COPD.  No acute abnormality.   Electronically Signed   By: Franchot Gallo M.D.   On: 05/01/2014 14:33    Microbiology: No results found for this or any previous visit (from the past 240 hour(s)).   Labs: Basic Metabolic Panel:  Recent Labs Lab 05/01/14 1427 05/02/14 0542 05/03/14 0635 05/05/14 0600 05/06/14 0612  NA 146 148* 142 142 144  K 2.8* 4.7 3.4* 3.6* 4.2  CL 103 110 104 108 108  CO2 30 28 26 25 28   GLUCOSE 109* 110* 90 91  76  BUN 25* 22 10 14 20   CREATININE 0.70 0.61 0.51 0.55 0.63  CALCIUM 9.3 8.9 9.0 8.9 8.6   Liver Function Tests:  Recent Labs Lab 05/01/14 1427 05/02/14 0542  AST 23 26  ALT 22 19  ALKPHOS 97 91  BILITOT 0.6 0.5  PROT 7.7 7.0  ALBUMIN 3.8 3.4*   No results for input(s): LIPASE, AMYLASE in the last 168 hours. No results for input(s): AMMONIA in the last 168 hours. CBC:  Recent Labs Lab 05/01/14 1427 05/02/14 0542 05/03/14 0635  WBC 6.4 4.9 6.7  NEUTROABS 4.5  --   --   HGB 15.3* 13.8 13.6  HCT 45.0 41.5 41.4  MCV 93.9 95.2 95.8  PLT 198 188 164   Cardiac Enzymes: No results for input(s): CKTOTAL, CKMB, CKMBINDEX, TROPONINI in the last 168 hours. BNP: BNP (last 3 results)  Recent Labs  09/29/13 1155  PROBNP 582.1*   CBG: No results for input(s): GLUCAP in the last 168 hours.     SignedRadene Gunning  [facility] 05/06/2014, 3:39 PM

## 2014-05-09 ENCOUNTER — Telehealth: Payer: Self-pay | Admitting: *Deleted

## 2014-05-09 NOTE — Telephone Encounter (Signed)
Submitted [employer] referral thru [employer] [facility] for authorization for Dr. Phylliss Bob with authorization number [id number], requested by Caren Griffins at [facility], once receive paper copy will fax to [facility]

## 2014-05-18 ENCOUNTER — Other Ambulatory Visit: Payer: Self-pay | Admitting: Cardiovascular Disease

## 2014-05-20 ENCOUNTER — Telehealth: Payer: Self-pay | Admitting: Family Medicine

## 2014-05-20 ENCOUNTER — Encounter: Payer: Self-pay | Admitting: Cardiovascular Disease

## 2014-05-20 ENCOUNTER — Ambulatory Visit (INDEPENDENT_AMBULATORY_CARE_PROVIDER_SITE_OTHER): Payer: Commercial Managed Care - HMO | Admitting: Cardiovascular Disease

## 2014-05-20 VITALS — BP 98/62 | HR 65 | Ht 62.0 in | Wt 87.5 lb

## 2014-05-20 DIAGNOSIS — E7800 Pure hypercholesterolemia, unspecified: Secondary | ICD-10-CM

## 2014-05-20 DIAGNOSIS — I10 Essential (primary) hypertension: Secondary | ICD-10-CM

## 2014-05-20 DIAGNOSIS — E43 Unspecified severe protein-calorie malnutrition: Secondary | ICD-10-CM

## 2014-05-20 DIAGNOSIS — Z79899 Other long term (current) drug therapy: Secondary | ICD-10-CM

## 2014-05-20 DIAGNOSIS — K219 Gastro-esophageal reflux disease without esophagitis: Secondary | ICD-10-CM

## 2014-05-20 DIAGNOSIS — Z720 Tobacco use: Secondary | ICD-10-CM

## 2014-05-20 DIAGNOSIS — R634 Abnormal weight loss: Secondary | ICD-10-CM

## 2014-05-20 DIAGNOSIS — I2583 Coronary atherosclerosis due to lipid rich plaque: Secondary | ICD-10-CM

## 2014-05-20 DIAGNOSIS — I2510 Atherosclerotic heart disease of native coronary artery without angina pectoris: Secondary | ICD-10-CM

## 2014-05-20 DIAGNOSIS — A310 Pulmonary mycobacterial infection: Secondary | ICD-10-CM

## 2014-05-20 MED ORDER — FUROSEMIDE 40 MG PO TABS: 40.0000 mg | ORAL_TABLET | Freq: Every day | ORAL | Status: DC

## 2014-05-20 MED ORDER — LEVOCETIRIZINE DIHYDROCHLORIDE 5 MG PO TABS: 5.0000 mg | ORAL_TABLET | Freq: Every evening | ORAL | Status: DC

## 2014-05-20 NOTE — Progress Notes (Signed)
Patient ID: Kathy Marshall, female   DOB: 1934/08/31, 79 y.o.   MRN: [id number]    HPI: Kathy Marshall is a 79 y.o. female who presents to the office for an 18 month follow-up cardiology evaluation.  Ms. Amalfitano has established cardiac disease and in 2006 underwent insertion of a Cypher stent to her RCA .  She has a history of paroxysmal SVT for which she's been on diltiazem. In April 2013 an echo Doppler study showed normal ejection fraction with mild concentric LVH, mild-to-moderate LA dilatation, mild mitral regurgitation and mild pulmonary hypertension with estimated RV systolic pressure at 42-59 mm. She had been hospitalized in February 2014 with coffee-ground hematemesis and was found to have a gastric ulcer by Dr. Maurene Capes. Apparently at that time she had been using Goody's powder and she also was on aspirin and Plavix. A  Myoview study in 09/2012 continued to show normal perfusion and was without scar or ischemia. Stress ejection fraction was 72% with normal wall motion.   Over the past 18 months, she is continued to smoke cigarettes.  She is smoking one pack per day and has been smoking for approximately 60-65 years.  She admits to leg swelling right greater than left.  She was recently hospitalized from 05/01/2014 through 05/06/2014.  I reviewed these hospital records.  She was felt to have gastroenteritis resulting in dehydration associated with hypokalemia.  Of note, she has had an 80-90 pound weight loss in the past year.  A chest CT showed possible Mycobacterium avium infection.  She is scheduled to see Dr. Luan Pulling and she may require bronchoscopy.  She has significant COPD, a history of hypertension, and also significant protein calorie malnutrition.  She presents to the office today for cardiology reevaluation.  Past Medical History  Diagnosis Date  . Low back pain   . Bronchitis   . Chronic hoarseness   . Sleep apnea, obstructive   . Renal artery stenosis   . PVD  (peripheral vascular disease)   . Hypercholesteremia   . Pain in limb   . COPD (chronic obstructive pulmonary disease)   . Benign essential hypertension   . Atherosclerosis     coronary vessel type  . Coronary artery disease   . Diverticulosis   . History of GI diverticular bleed   . History of esophagitis   . H. pylori infection   . Altered mental status   . Pneumonia   . TIA (transient ischemic attack) 07/2010  . Arthritis   . GERD (gastroesophageal reflux disease)   . History of blood clots   . Tubular adenoma 2012  . Heart murmur   . CHF (congestive heart failure)   . Anginal pain   . Myocardial infarct 1990's    "I've only had one" (12/06/2012)  . Exertional shortness of breath   . Daily headache   . Anxiety   . Gastric ulcer 07/2012    large; required clipping Archie Endo 08/23/2012 (12/06/2012)    Past Surgical History  Procedure Laterality Date  . Oophorectomy    . Coronary angioplasty with stent placement  2006    Cypher stent to RCA. On cath in 2008 stent described as widely patent.   . Esophagogastroduodenoscopy N/A 08/21/2012    Procedure: ESOPHAGOGASTRODUODENOSCOPY (EGD);  Surgeon: Lafayette Dragon, MD;  Location[facility] ENDOSCOPY;  Service: Endoscopy;  Laterality: N/A;  . Vaginal hysterectomy    . Esophagogastroduodenoscopy N/A 12/07/2012    Procedure: ESOPHAGOGASTRODUODENOSCOPY (EGD);  Surgeon: Gatha Mayer, MD;  Location:  [facility] ENDOSCOPY;  Service: Endoscopy;  Laterality: N/A;    No Known Allergies  Current Outpatient Prescriptions  Medication Sig Dispense Refill  . acetaminophen (TYLENOL) 325 MG tablet Take 650 mg by mouth every 6 (six) hours as needed for headache.     . albuterol (PROAIR HFA) 108 (90 BASE) MCG/ACT inhaler Inhale 2 puffs into the lungs every 4 (four) hours as needed for wheezing. 1 Inhaler 6  . ALPRAZolam (XANAX) 0.5 MG tablet TAKE 1 TABLET BY MOUTH TWICE A DAY AS NEEDED 60 tablet 1  . Amino Acids-Protein Hydrolys (FEEDING SUPPLEMENT, PRO-STAT SUGAR  FREE 64,) LIQD Take 30 mLs by mouth 3 (three) times daily with meals. 900 mL 0  . atorvastatin (LIPITOR) 80 MG tablet Take 1 tablet (80 mg total) by mouth at bedtime. MUST KEEP APPOINTMENT FOR FUTURE REFILLS. 15 tablet 0  . diphenoxylate-atropine (LOMOTIL) 2.5-0.025 MG per tablet Take 1 tablet by mouth 4 (four) times daily as needed for diarrhea or loose stools. 20 tablet 0  . donepezil (ARICEPT) 5 MG tablet Take 1 tablet (5 mg total) by mouth at bedtime. 30 tablet 11  . dronabinol (MARINOL) 2.5 MG capsule Take 1 capsule (2.5 mg total) by mouth daily. 30 capsule 0  . feeding supplement, RESOURCE BREEZE, (RESOURCE BREEZE) LIQD Take 1 Container by mouth 3 (three) times daily between meals. 90 Container 1  . fluticasone (FLONASE) 50 MCG/ACT nasal spray PLACE 2 SPRAYS INTO BOTH NOSTRILS DAILY. 16 g 11  . fluticasone-salmeterol (ADVAIR HFA) 45-21 MCG/ACT inhaler Inhale 2 puffs into the lungs 2 (two) times daily as needed (wheezing and shortness of breath). 1 Inhaler 5  . FLUZONE HIGH-DOSE 0.5 ML SUSY Inject 0.5 mLs as directed once.  0  . furosemide (LASIX) 20 MG tablet Take 1 tablet (20 mg total) by mouth daily as needed for fluid. 30 tablet 3  . gabapentin (NEURONTIN) 300 MG capsule TAKE 1 CAPSULE BY MOUTH DAILY AT BEDTIME 30 capsule 5  . HYDROmorphone (DILAUDID) 2 MG tablet Take 1 tablet by mouth 4 (four) times daily as needed for moderate pain.   0  . isosorbide mononitrate (IMDUR) 60 MG 24 hr tablet Take 1 tablet (60 mg total) by mouth daily. Keep office visit 11/24 in order to get further refills 30 tablet 0  . levofloxacin (LEVAQUIN) 750 MG tablet Take 1 tablet (750 mg total) by mouth daily at 6 PM. 7 tablet 0  . losartan (COZAAR) 50 MG tablet Take 50 mg by mouth daily.     . metoCLOPramide (REGLAN) 5 MG tablet TAKE 1 TABLET BY MOUTH TWICE A DAY 60 tablet 1  . metoprolol tartrate (LOPRESSOR) 25 MG tablet Take 1 tablet (25 mg total) by mouth 2 (two) times daily. 60 tablet 6  . mirtazapine (REMERON)  30 MG tablet Take 1 tablet (30 mg total) by mouth at bedtime. 30 tablet 5  . morphine (MS CONTIN) 30 MG 12 hr tablet Take 30 mg by mouth at bedtime.     . Multiple Vitamins-Minerals (ALIVE WOMENS 50+ PO) Take 1 tablet by mouth daily.    . Nutritional Supplements (COMPLETE PROTEIN/VITAMIN SHAKE PO) Take 2-3 Bottles by mouth daily as needed (nutritional supplment).     . ondansetron (ZOFRAN) 8 MG tablet Take 1 tablet by mouth 2 (two) times daily as needed for nausea or vomiting.   1  . oxyCODONE-acetaminophen (PERCOCET) 10-325 MG per tablet Take 1 tablet by mouth 5 (five) times daily as needed for pain.    Marland Kitchen  pantoprazole (PROTONIX) 40 MG tablet TAKE 1 TABLET (40 MG TOTAL) BY MOUTH 2 (TWO) TIMES DAILY. 60 tablet 2  . polyethylene glycol (MIRALAX / GLYCOLAX) packet Take 17 g by mouth daily as needed for mild constipation. 14 each 0  . promethazine (PHENERGAN) 25 MG tablet Take 1 tablet (25 mg total) by mouth daily as needed for nausea or vomiting. 30 tablet 0  . tiotropium (SPIRIVA) 18 MCG inhalation capsule Place 1 capsule (18 mcg total) into inhaler and inhale daily. 30 capsule 12  . traMADol (ULTRAM) 50 MG tablet 1-2 every six hours as needed for headaches     No current facility-administered medications for this visit.    Socially, she continues to smoke one pack of cigarettes per day. She is divorced with 6 children 13 grandchildren. She does not routinely exercise. There is no alcohol use.  ROS General: Positive significant weight loss HEENT: Negative; No changes in vision or hearing, sinus congestion, difficulty swallowing Pulmonary: Positive for shortness of breath shortness of breath, nohemoptysis Cardiovascular: Negative; No chest pain, presyncope, syncope, palpitations GI: Remote GI bleed No nausea, vomiting, diarrhea, or abdominal pain GU: Negative; No dysuria, hematuria, or difficulty voiding Musculoskeletal: Negative; no myalgias, joint pain, or weakness Hematologic/Oncology:  Negative; no easy bruising, bleeding Endocrine: Negative; no heat/cold intolerance; no diabetes Neuro: Positive for remote TIA Skin: Negative; No rashes or skin lesions Psychiatric: Negative; No behavioral problems, depression Sleep: Negative; No snoring, daytime sleepiness, hypersomnolence, bruxism, restless legs, hypnogognic hallucinations, no cataplexy Other comprehensive 14 point system review is negative.   PE BP 98/62 mmHg  Pulse 65  Ht 5\' 2"  (1.575 m)  Wt 87 lb 8 oz (39.69 kg)  BMI 16.00 kg/m2  General: Thin and cachectic in appearance. She smells like tobacco. HEENT: Normocephalic, atraumatic. Pupils round and reactive; sclera anicteric;  Nose without nasal septal hypertrophy Mouth/Parynx benign; Mallinpatti scale 2/3 Neck: No JVD, no carotid bruit Lungs: Decreased breath sounds diffusely; no wheezing or rales Chest wall: Nontender to palpation Heart: RRR, s1 s2 normal 1/6 systolic murmur. No S3 gallop.  No rubs thrills or heaves Abdomen: soft, nontender; no hepatosplenomehaly, BS+; abdominal aorta nontender and not dilated by palpation. Back: No CVA tenderness Pulses 2+ soft femoral bruits. Extremities: Positive for bilateral ankle edema, Homan's sign negative  Neurologic: grossly nonfocal Psychological normal affect.  ECG (independently read by me): Sinus rhythm at 65 bpm.  Normal intervals.  Poor progression V1 V2.  Prior June 2014 ECG: Sinus rhythm at 57 beats per minute without significant ST changes.  LABS:  BMET    Component Value Date/Time   NA 144 05/06/2014 0612   K 4.2 05/06/2014 0612   CL 108 05/06/2014 0612   CO2 28 05/06/2014 0612   GLUCOSE 76 05/06/2014 0612   BUN 20 05/06/2014 0612   CREATININE 0.63 05/06/2014 0612   CREATININE 0.94 01/31/2014 1552   CALCIUM 8.6 05/06/2014 0612   GFRNONAA 84* 05/06/2014 0612   GFRNONAA 59* 01/31/2014 1552   GFRAA >90 05/06/2014 0612   GFRAA 68 01/31/2014 1552     Hepatic Function Panel     Component  Value Date/Time   PROT 7.0 05/02/2014 0542   ALBUMIN 3.4* 05/02/2014 0542   AST 26 05/02/2014 0542   ALT 19 05/02/2014 0542   ALKPHOS 91 05/02/2014 0542   BILITOT 0.5 05/02/2014 0542     CBC    Component Value Date/Time   WBC 6.7 05/03/2014 0635   RBC 4.32 05/03/2014 0635   HGB 13.6  05/03/2014 0635   HCT 41.4 05/03/2014 0635   PLT 164 05/03/2014 0635   MCV 95.8 05/03/2014 0635   MCH 31.5 05/03/2014 0635   MCHC 32.9 05/03/2014 0635   RDW 13.9 05/03/2014 0635   LYMPHSABS 1.4 05/01/2014 1427   MONOABS 0.5 05/01/2014 1427   EOSABS 0.0 05/01/2014 1427   BASOSABS 0.0 05/01/2014 1427     BNP    Component Value Date/Time   PROBNP 582.1* 09/29/2013 1155    Lipid Panel     Component Value Date/Time   CHOL  03/20/2009 0220    112        ATP III CLASSIFICATION:  <200     mg/dL   Desirable  200-239  mg/dL   Borderline High  >=240    mg/dL   High          TRIG 104 03/20/2009 0220   HDL 33* 03/20/2009 0220   CHOLHDL 3.4 03/20/2009 0220   VLDL 21 03/20/2009 0220   LDLCALC  03/20/2009 0220    58        Total Cholesterol/HDL:CHD Risk Coronary Heart Disease Risk Table                     Men   Women  1/2 Average Risk   3.4   3.3  Average Risk       5.0   4.4  2 X Average Risk   9.6   7.1  3 X Average Risk  23.4   11.0        Use the calculated Patient Ratio above and the CHD Risk Table to determine the patient's CHD Risk.        ATP III CLASSIFICATION (LDL):  <100     mg/dL   Optimal  100-129  mg/dL   Near or Above                    Optimal  130-159  mg/dL   Borderline  160-189  mg/dL   High  >190     mg/dL   Very High     RADIOLOGY: Dg Chest 2 View  11/21/2012   *RADIOLOGY REPORT*  Clinical Data: Chest pain, hypertension  CHEST - 2 VIEW  Comparison: 10/26/2010  Findings: The heart and pulmonary vascularity are within normal limits.  Mild interstitial changes are noted of a chronic nature. No focal infiltrate or sizable effusion is seen.  Coronary stents are  noted.  IMPRESSION: Chronic changes without acute abnormality.   Original Report Authenticated By: Inez Catalina, M.D.   Ct Head Wo Contrast  11/21/2012   *RADIOLOGY REPORT*  Clinical Data: Confusion.  CT HEAD WITHOUT CONTRAST  Technique:  Contiguous axial images were obtained from the base of the skull through the vertex without contrast.  Comparison: 10/26/2010  Findings: Bone windows demonstrate fluid in the right mastoid air cells on image 2/series 3, new since the prior.  Remainder paranasal sinuses clear.  Soft tissue windows demonstrate no  mass lesion, hemorrhage, hydrocephalus, acute infarct, intra-axial, or extra-axial fluid collection.  Mild to moderate low density in the periventricular white matter likely related to small vessel disease.  IMPRESSION:  1. No acute intracranial abnormality. 2.  New right-sided mastoid effusion.   Original Report Authenticated By: Abigail Miyamoto, M.D.      ASSESSMENT AND PLAN: [name] is a 79 year old white female with a history of significant COPD, established coronary artery disease, who recently has developed progressive weight loss and  is undergoing evaluation for possible pulmonary Mycobacterium avium infection.  She is 9 years status post stenting of her right coronary artery with a 3.5x28 mm Cypher stent. Her last nuclear study continues to show normal perfusion.  I had a lengthy discussion with her concerning smoking cessation.  I do not believe she has significant interest in quitting.  She does have leg edema particularly at the ankles.  She has been taking furosemide 20 mg every other day and I have suggested that she increase this to 20 mg daily at least until the swelling resolves.  I also suggested support stockings.  She is still on Levaquin following her hospitalization.  Her blood pressure today is controlled on losartan 50 mg, Lopressor 25 mg twice a day in addition to her furosemide.  She is on pantoprazole 40 mg twice a day.  With her  history of ulcer disease and is tolerating this well.  She is on multiple inhalers and will be undergoing postoperative evaluation.  She admits to fatigue.  She is unaware of any witnessed apneic spells.  I will see her in follow-up in 3-4 months for further evaluation.  Time spent: 30 minutes  Troy Sine, MD, [facility]  05/20/2014 4:02 PM

## 2014-05-20 NOTE — Telephone Encounter (Signed)
Patients family member helen calling to say that the patient has a sore throat, would like to know if we can call something in for her [phone]

## 2014-05-20 NOTE — Patient Instructions (Addendum)
Your physician has recommended you make the following change in your medication: start new prescripton for furosemide 40 mg. Take 1 tablet daily.  Your physician recommends that you return for lab work in: 2-3 weeks.   Your physician recommends that you schedule a follow-up appointment in: 3-4 months.  Your physician has requested that you have an echocardiogram. Echocardiography is a painless test that uses sound waves to create images of your heart. It provides your doctor with information about the size and shape of your heart and how well your heart's chambers and valves are working. This procedure takes approximately one hour. There are no restrictions for this procedure. This will be done prior to your next visit.

## 2014-05-20 NOTE — Telephone Encounter (Signed)
Rx refill sent to patient pharmacy   

## 2014-05-20 NOTE — Telephone Encounter (Signed)
Spoke to [place] and pt only has runny nose and ST - no other symptoms noted - Ins will cover Xyzal 5mg  qd - ok to call in for pt?

## 2014-05-20 NOTE — Telephone Encounter (Signed)
Per [facility] ok to send RX for pt - Med sent to pharm

## 2014-05-29 ENCOUNTER — Telehealth: Payer: Self-pay | Admitting: Family Medicine

## 2014-05-29 MED ORDER — ALPRAZOLAM 0.5 MG PO TABS: 0.5000 mg | ORAL_TABLET | Freq: Two times a day (BID) | ORAL | Status: DC | PRN

## 2014-05-29 NOTE — Telephone Encounter (Signed)
Requesting a refill on her Xanax - ? OK to Refill  Send to [facility]

## 2014-05-29 NOTE — Telephone Encounter (Signed)
ok 

## 2014-05-29 NOTE — Telephone Encounter (Signed)
Med called to pharm 

## 2014-06-02 ENCOUNTER — Other Ambulatory Visit: Payer: Self-pay | Admitting: Internal Medicine

## 2014-06-02 ENCOUNTER — Other Ambulatory Visit: Payer: Self-pay | Admitting: Family Medicine

## 2014-06-04 ENCOUNTER — Telehealth: Payer: Self-pay | Admitting: Family Medicine

## 2014-06-04 NOTE — Telephone Encounter (Signed)
[phone]  Pt is needing to a refill on her claritin  [facility]

## 2014-06-04 NOTE — Telephone Encounter (Signed)
Call placed to patient.   Spoke with patient's grand-daughter, Elmyra Ricks.   Advised that patient is currently taking Xyzal, not claritin.   States that she will pick up from pharmacy.

## 2014-06-05 ENCOUNTER — Other Ambulatory Visit: Payer: Self-pay | Admitting: *Deleted

## 2014-06-05 DIAGNOSIS — F411 Generalized anxiety disorder: Secondary | ICD-10-CM

## 2014-06-05 MED ORDER — MIRTAZAPINE 30 MG PO TABS: 30.0000 mg | ORAL_TABLET | Freq: Every day | ORAL | Status: DC

## 2014-06-05 NOTE — Telephone Encounter (Signed)
Received fax requesting refill on remeron.   Refill appropriate and filled per protocol. 

## 2014-06-07 LAB — BASIC METABOLIC PANEL
BUN: 18 mg/dL (ref 6–23)
CHLORIDE: 106 meq/L (ref 96–112)
CO2: 27 meq/L (ref 19–32)
Calcium: 9.3 mg/dL (ref 8.4–10.5)
Creat: 0.67 mg/dL (ref 0.50–1.10)
Glucose, Bld: 86 mg/dL (ref 70–99)
POTASSIUM: 4.5 meq/L (ref 3.5–5.3)
Sodium: 140 mEq/L (ref 135–145)

## 2014-06-17 ENCOUNTER — Encounter: Payer: Self-pay | Admitting: *Deleted

## 2014-06-30 ENCOUNTER — Other Ambulatory Visit: Payer: Self-pay | Admitting: *Deleted

## 2014-06-30 MED ORDER — PANTOPRAZOLE SODIUM 40 MG PO TBEC: DELAYED_RELEASE_TABLET | ORAL | Status: DC

## 2014-07-01 ENCOUNTER — Telehealth: Payer: Self-pay | Admitting: *Deleted

## 2014-07-01 NOTE — Telephone Encounter (Signed)
Received a call from [facility] requesting a verbal order for pt to have a electrical stimulation for pain mgmt in lower back. Please advise!  [phone] Busing

## 2014-07-01 NOTE — Telephone Encounter (Signed)
Contacted Monique and informed her that pt sees a back doctor and he would have to do the verbal order for electrical stimulation

## 2014-07-01 NOTE — Telephone Encounter (Signed)
She sees a back doctor.  I have not ordered that.

## 2014-07-02 ENCOUNTER — Telehealth: Payer: Self-pay | Admitting: Family Medicine

## 2014-07-02 NOTE — Telephone Encounter (Signed)
Patients daughter nicole calling to speak with you regarding her oxygen level if possible  [phone]

## 2014-07-02 NOTE — Telephone Encounter (Signed)
PT was out to house yesterday and O2 sat was 83%.  That was in afternoon. PT did not do therapy and left house. Told them to get appt with PCP right away.  Family calling today to get her appointment.  83% O2 sat needs to be seen ASAP.  Daughter states patient home alone.  No stays with her.  Told them some one needs to go get her NOW (call ambulance) and take her to ED NOW.  Granddaughter says seems fine when sitting but very unsteady when gets up!!!  I called [facility].  Why was office not notified yesterday with low O2 reading.  They state they did!!  No messages other then request for back stimulator.   Elmyra Ricks, granddaughter, calls back now.  States O2 is 96%.  Pt w/o complaints. Does not want to go to [place] them she hadn't been feeling well the last few days but better today.  Made appt for her to be seen tomorrow as the have asked for Dr Dennard Schaumann.

## 2014-07-03 ENCOUNTER — Encounter: Payer: Self-pay | Admitting: Family Medicine

## 2014-07-03 ENCOUNTER — Ambulatory Visit (INDEPENDENT_AMBULATORY_CARE_PROVIDER_SITE_OTHER): Payer: Commercial Managed Care - HMO | Admitting: Family Medicine

## 2014-07-03 ENCOUNTER — Telehealth: Payer: Self-pay | Admitting: *Deleted

## 2014-07-03 VITALS — BP 142/74 | HR 66 | Temp 98.5°F | Resp 20 | Ht 62.0 in | Wt 92.0 lb

## 2014-07-03 DIAGNOSIS — J441 Chronic obstructive pulmonary disease with (acute) exacerbation: Secondary | ICD-10-CM | POA: Diagnosis not present

## 2014-07-03 MED ORDER — LEVOCETIRIZINE DIHYDROCHLORIDE 5 MG PO TABS: 5.0000 mg | ORAL_TABLET | Freq: Every evening | ORAL | Status: AC

## 2014-07-03 MED ORDER — PREDNISONE 20 MG PO TABS: ORAL_TABLET | ORAL | Status: DC

## 2014-07-03 MED ORDER — LEVOFLOXACIN 500 MG PO TABS: 500.0000 mg | ORAL_TABLET | Freq: Every day | ORAL | Status: DC

## 2014-07-03 MED ORDER — ALBUTEROL SULFATE HFA 108 (90 BASE) MCG/ACT IN AERS: 2.0000 | INHALATION_SPRAY | RESPIRATORY_TRACT | Status: AC | PRN

## 2014-07-03 NOTE — Telephone Encounter (Signed)
Pt daughter called stating that pt is was taking xyzol allergy med and states that she still has runny nose and the medication doesn't seem to work, wants to know if you can prescribe something else for her allergies.  [place]

## 2014-07-03 NOTE — Telephone Encounter (Signed)
ntbs

## 2014-07-03 NOTE — Progress Notes (Signed)
Subjective:    Patient ID: Kathy Marshall, female    DOB: 10/22/1934, 80 y.o.   MRN: [id number]  HPI  Patient was seen by her physical therapist yesterday was found to have an oxygen saturation around 84%. Patient states that she has not been feeling well recently. However she denies shortness of breath or chest pain. She has her chronic cough but this is not acutely worsened or changed in any way. Here today she is 94% on room air. She is 91% with ambulation on room air. However on examination she does have occasional Lexapro wheezes and right basilar crackles/Rales. Past Medical History  Diagnosis Date  . Low back pain   . Bronchitis   . Chronic hoarseness   . Sleep apnea, obstructive   . Renal artery stenosis   . PVD (peripheral vascular disease)   . Hypercholesteremia   . Pain in limb   . COPD (chronic obstructive pulmonary disease)   . Benign essential hypertension   . Atherosclerosis     coronary vessel type  . Coronary artery disease   . Diverticulosis   . History of GI diverticular bleed   . History of esophagitis   . H. pylori infection   . Altered mental status   . Pneumonia   . TIA (transient ischemic attack) 07/2010  . Arthritis   . GERD (gastroesophageal reflux disease)   . History of blood clots   . Tubular adenoma 2012  . Heart murmur   . CHF (congestive heart failure)   . Anginal pain   . Myocardial infarct 1990's    "I've only had one" (12/06/2012)  . Exertional shortness of breath   . Daily headache   . Anxiety   . Gastric ulcer 07/2012    large; required clipping Archie Endo 08/23/2012 (12/06/2012)   Past Surgical History  Procedure Laterality Date  . Oophorectomy    . Coronary angioplasty with stent placement  2006    Cypher stent to RCA. On cath in 2008 stent described as widely patent.   . Esophagogastroduodenoscopy N/A 08/21/2012    Procedure: ESOPHAGOGASTRODUODENOSCOPY (EGD);  Surgeon: Lafayette Dragon, MD;  Location: [facility] ENDOSCOPY;  Service:  Endoscopy;  Laterality: N/A;  . Vaginal hysterectomy    . Esophagogastroduodenoscopy N/A 12/07/2012    Procedure: ESOPHAGOGASTRODUODENOSCOPY (EGD);  Surgeon: Gatha Mayer, MD;  Location: [facility] ENDOSCOPY;  Service: Endoscopy;  Laterality: N/A;   Current Outpatient Prescriptions on File Prior to Visit  Medication Sig Dispense Refill  . acetaminophen (TYLENOL) 325 MG tablet Take 650 mg by mouth every 6 (six) hours as needed for headache.     . ALPRAZolam (XANAX) 0.5 MG tablet Take 1 tablet (0.5 mg total) by mouth 2 (two) times daily as needed. 60 tablet 2  . Amino Acids-Protein Hydrolys (FEEDING SUPPLEMENT, PRO-STAT SUGAR FREE 64,) LIQD Take 30 mLs by mouth 3 (three) times daily with meals. 900 mL 0  . atorvastatin (LIPITOR) 80 MG tablet Take 1 tablet (80 mg total) by mouth daily at 6 PM. 90 tablet 3  . donepezil (ARICEPT) 5 MG tablet Take 1 tablet (5 mg total) by mouth at bedtime. 30 tablet 11  . dronabinol (MARINOL) 2.5 MG capsule Take 1 capsule (2.5 mg total) by mouth daily. 30 capsule 0  . feeding supplement, RESOURCE BREEZE, (RESOURCE BREEZE) LIQD Take 1 Container by mouth 3 (three) times daily between meals. 90 Container 1  . fluticasone (FLONASE) 50 MCG/ACT nasal spray PLACE 2 SPRAYS INTO BOTH NOSTRILS DAILY. 16 g  11  . fluticasone-salmeterol (ADVAIR HFA) 45-21 MCG/ACT inhaler Inhale 2 puffs into the lungs 2 (two) times daily as needed (wheezing and shortness of breath). 1 Inhaler 5  . furosemide (LASIX) 40 MG tablet Take 1 tablet (40 mg total) by mouth daily. 30 tablet 6  . gabapentin (NEURONTIN) 300 MG capsule TAKE 1 CAPSULE BY MOUTH DAILY AT BEDTIME 30 capsule 5  . HYDROmorphone (DILAUDID) 2 MG tablet Take 1 tablet by mouth 4 (four) times daily as needed for moderate pain.   0  . isosorbide mononitrate (IMDUR) 60 MG 24 hr tablet Take 1 tablet (60 mg total) by mouth daily. Keep office visit 11/24 in order to get further refills 30 tablet 0  . losartan (COZAAR) 50 MG tablet Take 50 mg by mouth  daily.     . metoCLOPramide (REGLAN) 5 MG tablet TAKE 1 TABLET BY MOUTH TWICE A DAY 60 tablet 1  . metoprolol tartrate (LOPRESSOR) 25 MG tablet Take 1 tablet (25 mg total) by mouth 2 (two) times daily. 60 tablet 6  . mirtazapine (REMERON) 30 MG tablet Take 1 tablet (30 mg total) by mouth at bedtime. 30 tablet 5  . morphine (MS CONTIN) 30 MG 12 hr tablet Take 30 mg by mouth at bedtime.     . Multiple Vitamins-Minerals (ALIVE WOMENS 50+ PO) Take 1 tablet by mouth daily.    . Nutritional Supplements (COMPLETE PROTEIN/VITAMIN SHAKE PO) Take 2-3 Bottles by mouth daily as needed (nutritional supplment).     . ondansetron (ZOFRAN) 8 MG tablet Take 1 tablet by mouth 2 (two) times daily as needed for nausea or vomiting.   1  . oxyCODONE-acetaminophen (PERCOCET) 10-325 MG per tablet Take 1 tablet by mouth 5 (five) times daily as needed for pain.    . polyethylene glycol (MIRALAX / GLYCOLAX) packet Take 17 g by mouth daily as needed for mild constipation. 14 each 0  . tiotropium (SPIRIVA) 18 MCG inhalation capsule Place 1 capsule (18 mcg total) into inhaler and inhale daily. 30 capsule 12   No current facility-administered medications on file prior to visit.   No Known Allergies History   Social History  . Marital Status: Divorced    Spouse Name: N/A    Number of Children: 6  . Years of Education: N/A   Occupational History  . RETIRED    Social History Main Topics  . Smoking status: Current Every Day Smoker -- 1.00 packs/day for 60 years    Types: Cigarettes  . Smokeless tobacco: Never Used  . Alcohol Use: No     Comment: occasional  . Drug Use: No  . Sexual Activity: No   Other Topics Concern  . Not on file   Social History Narrative   4-6 cups of coffee daily      Review of Systems  All other systems reviewed and are negative.      Objective:   Physical Exam  Constitutional: She appears well-developed and well-nourished.  HENT:  Nose: Nose normal.  Mouth/Throat: Oropharynx  is clear and moist. No oropharyngeal exudate.  Eyes: Conjunctivae are normal. Pupils are equal, round, and reactive to light.  Neck: Neck supple.  Cardiovascular: Normal rate, regular rhythm and normal heart sounds.   No murmur heard. Pulmonary/Chest: Effort normal. She has wheezes. She has rales.  Abdominal: Soft. Bowel sounds are normal.  Musculoskeletal: She exhibits no edema.  Lymphadenopathy:    She has no cervical adenopathy.  Vitals reviewed.  Assessment & Plan:  COPD exacerbation - Plan: predniSONE (DELTASONE) 20 MG tablet, levofloxacin (LEVAQUIN) 500 MG tablet, albuterol (PROAIR HFA) 108 (90 BASE) MCG/ACT inhaler  Patient appears to be developing community-acquired pneumonia and has underlying COPD. I will treat her as a COPD exacerbation. Begin Levaquin 500 mg by mouth daily for 7 days, begin a prednisone taper pack. Start albuterol 2 puffs inhaled every 4-6 hours as needed. Recheck here or with her pulmonologist next week. The patient's oxygen worsen she may need portable oxygen at home. At the present time she does not qualify for portable oxygen. She is instructed to follow-up here immediately if her symptoms worsen.

## 2014-07-03 NOTE — Telephone Encounter (Signed)
Pt had OV with Dr. Dennard Schaumann today. Will forward to him.

## 2014-07-04 ENCOUNTER — Telehealth: Payer: Self-pay | Admitting: Family Medicine

## 2014-07-04 NOTE — Telephone Encounter (Signed)
Pt is already taking Flonase and the Xyzal with no help in the runny nose, can we call something else in?

## 2014-07-04 NOTE — Telephone Encounter (Signed)
flonase for allergies.  Schedule to be seen next week for recheck of PNA here or at pulmonologist.

## 2014-07-04 NOTE — Telephone Encounter (Signed)
Suggest flonase 2 sprays each nostril daily.

## 2014-07-04 NOTE — Telephone Encounter (Signed)
Per Dr. Dennard Schaumann - pt is on Prednisone for COPD exacerbation and if it was allergies this would take care of it - if it does not it is just a cold and will have to run its course. [place] daughter verbalized understanding.

## 2014-07-04 NOTE — Telephone Encounter (Signed)
Can we arrange for [facility] nurse to visit to check on her pneumonia.   Has been taking her allergy med but says it does not seem to be working, has constant runny nose.  Clear secretions.  Wants to know if different allergy med she could use.  I asked if she was using her Flonase, granddaughter did not know but will ask patient.  Asking for your advise!!

## 2014-07-07 NOTE — Telephone Encounter (Signed)
Spoke to granddaughter.  Told provider felt [facility] nurse not needed.  Keep follow up appt next week.  Told to use Flonase daily for runny nose.

## 2014-07-10 ENCOUNTER — Encounter: Payer: Self-pay | Admitting: Family Medicine

## 2014-07-10 ENCOUNTER — Ambulatory Visit (INDEPENDENT_AMBULATORY_CARE_PROVIDER_SITE_OTHER): Payer: Commercial Managed Care - HMO | Admitting: Family Medicine

## 2014-07-10 VITALS — BP 104/58 | HR 58 | Temp 98.3°F | Resp 18 | Ht 62.0 in | Wt 90.0 lb

## 2014-07-10 DIAGNOSIS — J441 Chronic obstructive pulmonary disease with (acute) exacerbation: Secondary | ICD-10-CM | POA: Diagnosis not present

## 2014-07-10 NOTE — Progress Notes (Signed)
Subjective:    Patient ID: Kathy Marshall, female    DOB: [birth year], 80 y.o.   MRN: [id number]  HPI  07/03/14 Patient was seen by her physical therapist yesterday was found to have an oxygen saturation around 84%. Patient states that she has not been feeling well recently. However she denies shortness of breath or chest pain. She has her chronic cough but this is not acutely worsened or changed in any way. Here today she is 94% on room air. She is 91% with ambulation on room air. However on examination she does have occasional Lexapro wheezes and right basilar crackles/Rales.  At that time, my plan was: Patient appears to be developing community-acquired pneumonia and has underlying COPD. I will treat her as a COPD exacerbation. Begin Levaquin 500 mg by mouth daily for 7 days, begin a prednisone taper pack. Start albuterol 2 puffs inhaled every 4-6 hours as needed. Recheck here or with her pulmonologist next week. The patient's oxygen worsen she may need portable oxygen at home. At the present time she does not qualify for portable oxygen. She is instructed to follow-up here immediately if her symptoms worsen.  07/10/14 She is here today for recheck. Her breathing is some better. On examination today she continues to have right basilar Rales and expiratory wheezing. Unfortunate she continues to smoke. With ambulation greater than 60 feet, her oxygen saturation drops to 88% on room air.  Her oxygen saturation is 95% on 2 L.  She is taking 3 the once a day and Advair twice a day. She is also using Mucinex. She denies any fevers or chills Past Medical History  Diagnosis Date  . Low back pain   . Bronchitis   . Chronic hoarseness   . Sleep apnea, obstructive   . Renal artery stenosis   . PVD (peripheral vascular disease)   . Hypercholesteremia   . Pain in limb   . COPD (chronic obstructive pulmonary disease)   . Benign essential hypertension   . Atherosclerosis     coronary vessel type  .  Coronary artery disease   . Diverticulosis   . History of GI diverticular bleed   . History of esophagitis   . H. pylori infection   . Altered mental status   . Pneumonia   . TIA (transient ischemic attack) 07/2010  . Arthritis   . GERD (gastroesophageal reflux disease)   . History of blood clots   . Tubular adenoma 2012  . Heart murmur   . CHF (congestive heart failure)   . Anginal pain   . Myocardial infarct 1990's    "I've only had one" (12/06/2012)  . Exertional shortness of breath   . Daily headache   . Anxiety   . Gastric ulcer 07/2012    large; required clipping Archie Endo 08/23/2012 (12/06/2012)   Past Surgical History  Procedure Laterality Date  . Oophorectomy    . Coronary angioplasty with stent placement  2006    Cypher stent to RCA. On cath in 2008 stent described as widely patent.   . Esophagogastroduodenoscopy N/A 08/21/2012    Procedure: ESOPHAGOGASTRODUODENOSCOPY (EGD);  Surgeon: Lafayette Dragon, MD;  Location: [facility] ENDOSCOPY;  Service: Endoscopy;  Laterality: N/A;  . Vaginal hysterectomy    . Esophagogastroduodenoscopy N/A 12/07/2012    Procedure: ESOPHAGOGASTRODUODENOSCOPY (EGD);  Surgeon: Gatha Mayer, MD;  Location: [facility] ENDOSCOPY;  Service: Endoscopy;  Laterality: N/A;   Current Outpatient Prescriptions on File Prior to Visit  Medication Sig Dispense Refill  .  acetaminophen (TYLENOL) 325 MG tablet Take 650 mg by mouth every 6 (six) hours as needed for headache.     . albuterol (PROAIR HFA) 108 (90 BASE) MCG/ACT inhaler Inhale 2 puffs into the lungs every 4 (four) hours as needed for wheezing. 1 Inhaler 6  . ALPRAZolam (XANAX) 0.5 MG tablet Take 1 tablet (0.5 mg total) by mouth 2 (two) times daily as needed. 60 tablet 2  . Amino Acids-Protein Hydrolys (FEEDING SUPPLEMENT, PRO-STAT SUGAR FREE 64,) LIQD Take 30 mLs by mouth 3 (three) times daily with meals. 900 mL 0  . atorvastatin (LIPITOR) 80 MG tablet Take 1 tablet (80 mg total) by mouth daily at 6 PM. 90 tablet 3  .  donepezil (ARICEPT) 5 MG tablet Take 1 tablet (5 mg total) by mouth at bedtime. 30 tablet 11  . dronabinol (MARINOL) 2.5 MG capsule Take 1 capsule (2.5 mg total) by mouth daily. 30 capsule 0  . feeding supplement, RESOURCE BREEZE, (RESOURCE BREEZE) LIQD Take 1 Container by mouth 3 (three) times daily between meals. 90 Container 1  . fluticasone (FLONASE) 50 MCG/ACT nasal spray PLACE 2 SPRAYS INTO BOTH NOSTRILS DAILY. 16 g 11  . fluticasone-salmeterol (ADVAIR HFA) 45-21 MCG/ACT inhaler Inhale 2 puffs into the lungs 2 (two) times daily as needed (wheezing and shortness of breath). 1 Inhaler 5  . furosemide (LASIX) 40 MG tablet Take 1 tablet (40 mg total) by mouth daily. 30 tablet 6  . gabapentin (NEURONTIN) 300 MG capsule TAKE 1 CAPSULE BY MOUTH DAILY AT BEDTIME 30 capsule 5  . HYDROmorphone (DILAUDID) 2 MG tablet Take 1 tablet by mouth 4 (four) times daily as needed for moderate pain.   0  . isosorbide mononitrate (IMDUR) 60 MG 24 hr tablet Take 1 tablet (60 mg total) by mouth daily. Keep office visit 11/24 in order to get further refills 30 tablet 0  . levocetirizine (XYZAL) 5 MG tablet Take 1 tablet (5 mg total) by mouth every evening. 30 tablet 11  . losartan (COZAAR) 50 MG tablet Take 50 mg by mouth daily.     . metoCLOPramide (REGLAN) 5 MG tablet TAKE 1 TABLET BY MOUTH TWICE A DAY 60 tablet 1  . metoprolol tartrate (LOPRESSOR) 25 MG tablet Take 1 tablet (25 mg total) by mouth 2 (two) times daily. 60 tablet 6  . mirtazapine (REMERON) 30 MG tablet Take 1 tablet (30 mg total) by mouth at bedtime. 30 tablet 5  . morphine (MS CONTIN) 30 MG 12 hr tablet Take 30 mg by mouth at bedtime.     . Multiple Vitamins-Minerals (ALIVE WOMENS 50+ PO) Take 1 tablet by mouth daily.    . Nutritional Supplements (COMPLETE PROTEIN/VITAMIN SHAKE PO) Take 2-3 Bottles by mouth daily as needed (nutritional supplment).     . ondansetron (ZOFRAN) 8 MG tablet Take 1 tablet by mouth 2 (two) times daily as needed for nausea or  vomiting.   1  . oxyCODONE-acetaminophen (PERCOCET) 10-325 MG per tablet Take 1 tablet by mouth 5 (five) times daily as needed for pain.    . polyethylene glycol (MIRALAX / GLYCOLAX) packet Take 17 g by mouth daily as needed for mild constipation. 14 each 0  . tiotropium (SPIRIVA) 18 MCG inhalation capsule Place 1 capsule (18 mcg total) into inhaler and inhale daily. 30 capsule 12   No current facility-administered medications on file prior to visit.   No Known Allergies History   Social History  . Marital Status: Divorced    Spouse Name:  N/A    Number of Children: 6  . Years of Education: N/A   Occupational History  . RETIRED    Social History Main Topics  . Smoking status: Current Every Day Smoker -- 1.00 packs/day for 60 years    Types: Cigarettes  . Smokeless tobacco: Never Used  . Alcohol Use: No     Comment: occasional  . Drug Use: No  . Sexual Activity: No   Other Topics Concern  . Not on file   Social History Narrative   4-6 cups of coffee daily      Review of Systems  All other systems reviewed and are negative.      Objective:   Physical Exam  Constitutional: She appears well-developed and well-nourished.  HENT:  Nose: Nose normal.  Mouth/Throat: Oropharynx is clear and moist. No oropharyngeal exudate.  Eyes: Conjunctivae are normal. Pupils are equal, round, and reactive to light.  Neck: Neck supple.  Cardiovascular: Normal rate, regular rhythm and normal heart sounds.   No murmur heard. Pulmonary/Chest: Effort normal. She has wheezes. She has rales.  Abdominal: Soft. Bowel sounds are normal.  Musculoskeletal: She exhibits no edema.  Lymphadenopathy:    She has no cervical adenopathy.  Vitals reviewed.         Assessment & Plan:  COPD exacerbation  I explained to the patient that as long as she continues to smoke this will only get worse and she will inevitably wind up in the hospital. Patient qualifies for portable oxygen at 2 L but she  refuses it at this time. I will gladly arrange this for her in the future if she changes her mind. I recommended that she continues to grieve the and Mucinex on a daily basis. Discontinue Advair and switch to dulera 200/50 2 puff inh bid to optimize her long-acting bronchodilator.  Recheck next week if no better or sooner if worse.

## 2014-07-11 ENCOUNTER — Ambulatory Visit (HOSPITAL_COMMUNITY)
Admission: RE | Admit: 2014-07-11 | Discharge: 2014-07-11 | Disposition: A | Discharge status: Home / Self Care | Payer: Commercial Managed Care - HMO | Source: Ambulatory Visit | Attending: Cardiovascular Disease | Admitting: Cardiovascular Disease

## 2014-07-11 DIAGNOSIS — I2583 Coronary atherosclerosis due to lipid rich plaque: Secondary | ICD-10-CM

## 2014-07-11 DIAGNOSIS — J441 Chronic obstructive pulmonary disease with (acute) exacerbation: Secondary | ICD-10-CM

## 2014-07-11 DIAGNOSIS — I2510 Atherosclerotic heart disease of native coronary artery without angina pectoris: Secondary | ICD-10-CM | POA: Insufficient documentation

## 2014-07-11 DIAGNOSIS — I10 Essential (primary) hypertension: Secondary | ICD-10-CM | POA: Insufficient documentation

## 2014-07-11 DIAGNOSIS — Z720 Tobacco use: Secondary | ICD-10-CM | POA: Diagnosis not present

## 2014-07-11 DIAGNOSIS — I059 Rheumatic mitral valve disease, unspecified: Secondary | ICD-10-CM

## 2014-07-11 DIAGNOSIS — E785 Hyperlipidemia, unspecified: Secondary | ICD-10-CM | POA: Insufficient documentation

## 2014-07-11 NOTE — Progress Notes (Signed)
2D Echocardiogram Complete.  07/11/2014   Kathy Marshall [place], [place]

## 2014-07-13 ENCOUNTER — Other Ambulatory Visit: Payer: Self-pay | Admitting: Cardiovascular Disease

## 2014-07-17 ENCOUNTER — Ambulatory Visit: Payer: Commercial Managed Care - HMO | Admitting: Family Medicine

## 2014-07-23 ENCOUNTER — Other Ambulatory Visit: Payer: Self-pay | Admitting: Cardiovascular Disease

## 2014-07-23 NOTE — Telephone Encounter (Signed)
Rx(s) sent to pharmacy electronically.  

## 2014-07-27 ENCOUNTER — Other Ambulatory Visit: Payer: Self-pay | Admitting: Family Medicine

## 2014-07-28 NOTE — Telephone Encounter (Signed)
?   OK to Refill  

## 2014-07-28 NOTE — Telephone Encounter (Signed)
ok 

## 2014-07-29 NOTE — Telephone Encounter (Signed)
rx printed fax to pharmacy

## 2014-08-04 ENCOUNTER — Other Ambulatory Visit: Payer: Self-pay | Admitting: Family Medicine

## 2014-08-04 ENCOUNTER — Other Ambulatory Visit: Payer: Self-pay | Admitting: Internal Medicine

## 2014-08-12 ENCOUNTER — Encounter: Payer: Self-pay | Admitting: Pulmonary Disease

## 2014-08-12 ENCOUNTER — Encounter (INDEPENDENT_AMBULATORY_CARE_PROVIDER_SITE_OTHER): Payer: Self-pay

## 2014-08-12 ENCOUNTER — Ambulatory Visit (INDEPENDENT_AMBULATORY_CARE_PROVIDER_SITE_OTHER)
Admission: RE | Admit: 2014-08-12 | Discharge: 2014-08-12 | Disposition: A | Discharge status: Home / Self Care | Payer: Commercial Managed Care - HMO | Source: Ambulatory Visit | Attending: Pulmonary Disease | Admitting: Pulmonary Disease

## 2014-08-12 ENCOUNTER — Institutional Professional Consult (permissible substitution): Payer: Commercial Managed Care - HMO | Admitting: Pulmonary Disease

## 2014-08-12 ENCOUNTER — Ambulatory Visit (INDEPENDENT_AMBULATORY_CARE_PROVIDER_SITE_OTHER): Payer: Commercial Managed Care - HMO | Admitting: Pulmonary Disease

## 2014-08-12 VITALS — BP 100/68 | HR 60 | Ht 62.0 in | Wt 93.8 lb

## 2014-08-12 DIAGNOSIS — A310 Pulmonary mycobacterial infection: Secondary | ICD-10-CM | POA: Diagnosis not present

## 2014-08-12 NOTE — Patient Instructions (Addendum)
We will proceed with bronchoscopy and biopsy on Tuesday Aug 19 798 Nothing to eat from midnight the night before We discussed risks & benefits We discussed mAC infection CXR today You have to quit smoking

## 2014-08-12 NOTE — Progress Notes (Signed)
Subjective:    Patient ID: Kathy Marshall, female    DOB: 10-02-1934, 80 y.o.   MRN: [id number]  HPI   PCP - [place]  80 year old smoker with COPD, is seen at the request of Dr. Luan Pulling, to consider pulmonary mycobacterial disease. She is accompanied by her daughter today.  She smokes a pack per day since her teenage years, more than 50 pack years. Her main complaint is weight loss-she has lost from 160 pounds to her current weight of 94 pounds over the last 2 years. She has absolutely no appetite . She has been started on dronabinol -and does is being titrated  She was hospitalized in 04/2014 and underwent CT chest and abdomen. This showed mild patchy right lower lobe opacity suspicious for pneumonia. There were areas of bronchiectasis in both lower lobes with tree in bud nodular opacities, which raised suspicion for atypical mycobacterial disease. She reports mild dyspnea on exertion, is able to walk around the house, does not go out much. She denies wheezing or frequent flares. She has an early morning cough. She was seen by her PCP on 1/14, noted to desaturate on exertion, but refused oxygen. She has a pulse oximeter at home-and states that her sats around in the low 90s. She was on Advair and Spiriva, this was changed to dulera recently. She has CAD status post stent to RCA in 2006. She has paroxysmal atrial fibrillation on Cardizem with good LV function. She takes Lasix every other day.   Past Medical History  Diagnosis Date  . Low back pain   . Bronchitis   . Chronic hoarseness   . Sleep apnea, obstructive   . Renal artery stenosis   . PVD (peripheral vascular disease)   . Hypercholesteremia   . Pain in limb   . COPD (chronic obstructive pulmonary disease)   . Benign essential hypertension   . Atherosclerosis     coronary vessel type  . Coronary artery disease   . Diverticulosis   . History of GI diverticular bleed   . History of esophagitis   . H. pylori infection     . Altered mental status   . Pneumonia   . TIA (transient ischemic attack) 07/2010  . Arthritis   . GERD (gastroesophageal reflux disease)   . History of blood clots   . Tubular adenoma 2012  . Heart murmur   . CHF (congestive heart failure)   . Anginal pain   . Myocardial infarct 1990's    "I've only had one" (12/06/2012)  . Exertional shortness of breath   . Daily headache   . Anxiety   . Gastric ulcer 07/2012    large; required clipping Archie Endo 08/23/2012 (12/06/2012)   Past Surgical History  Procedure Laterality Date  . Oophorectomy    . Coronary angioplasty with stent placement  2006    Cypher stent to RCA. On cath in 2008 stent described as widely patent.   . Esophagogastroduodenoscopy N/A 08/21/2012    Procedure: ESOPHAGOGASTRODUODENOSCOPY (EGD);  Surgeon: Lafayette Dragon, MD;  [facility] ENDOSCOPY;  Service: Endoscopy;  Laterality: N/A;  . Vaginal hysterectomy    . Esophagogastroduodenoscopy N/A 12/07/2012    Procedure: ESOPHAGOGASTRODUODENOSCOPY (EGD);  Surgeon: Gatha Mayer, MD;  Location: [facility] ENDOSCOPY;  Service: Endoscopy;  Laterality: N/A;    No Known Allergies  History   Social History  . Marital Status: Divorced    Spouse Name: N/A  . Number of Children: 6  . Years of Education: N/A  Occupational History  . RETIRED    Social History Main Topics  . Smoking status: Current Every Day Smoker -- 1.00 packs/day for 60 years    Types: Cigarettes  . Smokeless tobacco: Never Used  . Alcohol Use: No     Comment: occasional  . Drug Use: No  . Sexual Activity: No   Other Topics Concern  . Not on file   Social History Narrative   4-6 cups of coffee daily     Family History  Problem Relation Age of Onset  . Heart disease Mother   . Stroke Mother   . Stomach cancer Mother   . Heart disease Father   . Stroke Father   . Heart disease Sister   . Colon cancer Neg Hx   . Lung cancer Sister      Review of Systems  Constitutional: Negative for fever and  unexpected weight change.  HENT: Negative for congestion, dental problem, ear pain, nosebleeds, postnasal drip, rhinorrhea, sinus pressure, sneezing, sore throat and trouble swallowing.   Eyes: Negative for redness and itching.  Respiratory: Negative for cough, chest tightness, shortness of breath and wheezing.   Cardiovascular: Negative for palpitations and leg swelling.  Gastrointestinal: Negative for nausea and vomiting.  Genitourinary: Negative for dysuria.  Musculoskeletal: Negative for joint swelling.  Skin: Negative for rash.  Neurological: Negative for headaches.  Hematological: Does not bruise/bleed easily.  Psychiatric/Behavioral: Negative for dysphoric mood. The patient is not nervous/anxious.        Objective:   Physical Exam  Gen. Pleasant, cachectic, in no distress ENT - no lesions, no post nasal drip Neck: No JVD, no thyromegaly, no carotid bruits Lungs: no use of accessory muscles, no dullness to percussion, decreased without rales or rhonchi  Cardiovascular: Rhythm regular, heart sounds  normal, no murmurs or gallops, no peripheral edema Musculoskeletal: No deformities, no cyanosis or clubbing        Assessment & Plan:

## 2014-08-12 NOTE — Assessment & Plan Note (Signed)
We will proceed with bronchoscopy and biopsy on Tuesday Aug 19 798 Nothing to eat from midnight the night before The various options of biopsy including bronchoscopy, CT guided needle aspiration and surgical biopsy were discussed.The risks of each procedure including coughing, bleeding and the  chances of lung puncture requiring chest tube were discussed in great detail. The benefits & alternatives including serial follow up were also discussed.  We discussed mAC infection and long course of treatment CXR today You have to quit smoking -

## 2014-08-13 ENCOUNTER — Telehealth: Payer: Self-pay | Admitting: *Deleted

## 2014-08-13 NOTE — Telephone Encounter (Signed)
Submitted [employer] referral thru [facility] for authorization for Dr. Kara Mead, MD Pulmonary with authorization number [id number]   Dx: J44.9-chronic obstructive pulmonary disease, unspecified  Number of visits:6  Start Date: 08/12/14- Ending Date:02/08/2015  Faxed to pulmonary office for review/records

## 2014-08-15 ENCOUNTER — Ambulatory Visit (INDEPENDENT_AMBULATORY_CARE_PROVIDER_SITE_OTHER): Payer: Commercial Managed Care - HMO | Admitting: Cardiovascular Disease

## 2014-08-15 VITALS — BP 150/90 | HR 58 | Ht 62.0 in | Wt 92.9 lb

## 2014-08-15 DIAGNOSIS — E7800 Pure hypercholesterolemia, unspecified: Secondary | ICD-10-CM

## 2014-08-15 DIAGNOSIS — I10 Essential (primary) hypertension: Secondary | ICD-10-CM | POA: Diagnosis not present

## 2014-08-15 DIAGNOSIS — I252 Old myocardial infarction: Secondary | ICD-10-CM

## 2014-08-15 DIAGNOSIS — R002 Palpitations: Secondary | ICD-10-CM

## 2014-08-15 DIAGNOSIS — I2510 Atherosclerotic heart disease of native coronary artery without angina pectoris: Secondary | ICD-10-CM | POA: Diagnosis not present

## 2014-08-15 NOTE — Patient Instructions (Signed)
Please follow up with Dr. Claiborne Billings in 2 months.    Your Doctor has ordered you to wear a heart monitor. You will wear this for 30 days.   TIPS -  REMINDERS 1. The sensor is the lanyard that is worn around your neck every day - this is powered by a battery that needs to be changed every day 2. The monitor is the device that allows you to record symptoms - this will need to be charged daily 3. The sensor & monitor need to be within 100 feet of each other at all times 4. The sensor connects to the electrodes (stickers) - these should be changed every 24-48 hours (you do not have to remove them when you bathe, just make sure they are dry when you connect it back to the sensor 5. If you need more supplies (electrodes, batteries), please call the 1-800 # on the back of the pamphlet and [employer] will mail you more supplies 6. If your skin becomes sensitive, please try the sample pack of sensitive skin electrodes (the white packet in your silver box) and call [employer] to have them mail you more of these type of electrodes 7. When you are finish wearing the monitor, please place all supplies back in the silver box, place the silver box in the pre-packaged UPS bag and drop off at [employer] or call them so they can come pick it up   Cardiac Event Monitoring A cardiac event monitor is a small recording device used to help detect abnormal heart rhythms (arrhythmias). The monitor is used to record heart rhythm when noticeable symptoms such as the following occur:  Fast heartbeats (palpitations), such as heart racing or fluttering.  Dizziness.  Fainting or light-headedness.  Unexplained weakness. The monitor is wired to two electrodes placed on your chest. Electrodes are flat, sticky disks that attach to your skin. The monitor can be worn for up to 30 days. You will wear the monitor at all times, except when bathing.  HOW TO USE YOUR CARDIAC EVENT MONITOR A technician will prepare your chest for the electrode  placement. The technician will show you how to place the electrodes, how to work the monitor, and how to replace the batteries. Take time to practice using the monitor before you leave the office. Make sure you understand how to send the information from the monitor to your health care provider. This requires a telephone with a landline, not a cell phone. You need to:  Wear your monitor at all times, except when you are in water:  Do not get the monitor wet.  Take the monitor off when bathing. Do not swim or use a hot tub with it on.  Keep your skin clean. Do not put body lotion or moisturizer on your chest.  Change the electrodes daily or any time they stop sticking to your skin. You might need to use tape to keep them on.  It is possible that your skin under the electrodes could become irritated. To keep this from happening, try to put the electrodes in slightly different places on your chest. However, they must remain in the area under your left breast and in the upper right section of your chest.  Make sure the monitor is safely clipped to your clothing or in a location close to your body that your health care provider recommends.  Press the button to record when you feel symptoms of heart trouble, such as dizziness, weakness, light-headedness, palpitations, thumping, shortness of breath,  unexplained weakness, or a fluttering or racing heart. The monitor is always on and records what happened slightly before you pressed the button, so do not worry about being too late to get good information.  Keep a diary of your activities, such as walking, doing chores, and taking medicine. It is especially important to note what you were doing when you pushed the button to record your symptoms. This will help your health care provider determine what might be contributing to your symptoms. The information stored in your monitor will be reviewed by your health care provider alongside your diary  entries.  Send the recorded information as recommended by your health care provider. It is important to understand that it will take some time for your health care provider to process the results.  Change the batteries as recommended by your health care provider. SEEK IMMEDIATE MEDICAL CARE IF:   You have chest pain.  You have extreme difficulty breathing or shortness of breath.  You develop a very fast heartbeat that persists.  You develop dizziness that does not go away.  You faint or constantly feel you are about to faint. Document Released: 03/22/2008 Document Revised: 10/28/2013 Document Reviewed: 12/10/2012 [facility] Patient Information 2015 [place], [place]. This information is not intended to replace advice given to you by your health care provider. Make sure you discuss any questions you have with your health care provider.

## 2014-08-17 ENCOUNTER — Encounter: Payer: Self-pay | Admitting: Cardiovascular Disease

## 2014-08-17 DIAGNOSIS — R002 Palpitations: Secondary | ICD-10-CM | POA: Insufficient documentation

## 2014-08-17 NOTE — Progress Notes (Signed)
Patient ID: Kathy Marshall, female   DOB: [birth year], 80 y.o.   MRN: [id number]    HPI: Kathy Marshall is a 80 y.o. female who presents to the office for an 18 month follow-up cardiology evaluation.  Kathy Marshall has established CAD and in 2006 underwent insertion of a Cypher stent to her RCA.  She has a history of paroxysmal SVT for which she's been on diltiazem. In April 2013 an echo Doppler study showed normal ejection fraction with mild concentric LVH, mild-to-moderate LA dilatation, mild mitral regurgitation and mild pulmonary hypertension with estimated RV systolic pressure at 91-79 mm. She had been hospitalized in February 2014 with coffee-ground hematemesis and was found to have a gastric ulcer by Dr. Maurene Capes. Apparently at that time she had been using Goody's powder and she also was on aspirin and Plavix. A  Myoview study in 09/2012  showed normal perfusion and was without scar or ischemia. Stress ejection fraction was 72% with normal wall motion.   She has a long-standing tobacco history  smoking one pack per day and has been smoking for approximately 60-65 years.  She admits to leg swelling right greater than left.  She was  hospitalized from 05/01/2014 through 05/06/2014.  I reviewed these hospital records.  She was felt to have gastroenteritis resulting in dehydration associated with hypokalemia.  Of note, she has had an 80-90 pound weight loss in the past year.  A chest CT showed possible Mycobacterium avium infection.  She also has a history of COPD, hypertension, and malnutrition.  She saw Dr. Luan Pulling in follow-up of her chest CT.  She is scheduled to undergo video bronchoscopy on 08/19/2014 with Dr. Elsworth Soho.  Kathy Marshall admits to episodes of increased palpitations occur several times per week.  She denies chest tightness.  She does wheeze.  She presents for evaluation.   Past Medical History  Diagnosis Date  . Low back pain   . Bronchitis   . Chronic hoarseness   .  Sleep apnea, obstructive   . Renal artery stenosis   . PVD (peripheral vascular disease)   . Hypercholesteremia   . Pain in limb   . COPD (chronic obstructive pulmonary disease)   . Benign essential hypertension   . Atherosclerosis     coronary vessel type  . Coronary artery disease   . Diverticulosis   . History of GI diverticular bleed   . History of esophagitis   . H. pylori infection   . Altered mental status   . Pneumonia   . TIA (transient ischemic attack) 07/2010  . Arthritis   . GERD (gastroesophageal reflux disease)   . History of blood clots   . Tubular adenoma 2012  . Heart murmur   . CHF (congestive heart failure)   . Anginal pain   . Myocardial infarct 1990's    "I've only had one" (12/06/2012)  . Exertional shortness of breath   . Daily headache   . Anxiety   . Gastric ulcer 07/2012    large; required clipping Archie Endo 08/23/2012 (12/06/2012)    Past Surgical History  Procedure Laterality Date  . Oophorectomy    . Coronary angioplasty with stent placement  2006    Cypher stent to RCA. On cath in 2008 stent described as widely patent.   . Esophagogastroduodenoscopy N/A 08/21/2012    Procedure: ESOPHAGOGASTRODUODENOSCOPY (EGD);  Surgeon: Lafayette Dragon, MD;  Location: [facility] ENDOSCOPY;  Service: Endoscopy;  Laterality: N/A;  . Vaginal hysterectomy    . Esophagogastroduodenoscopy  N/A 12/07/2012    Procedure: ESOPHAGOGASTRODUODENOSCOPY (EGD);  Surgeon: Gatha Mayer, MD;  Location: [facility] ENDOSCOPY;  Service: Endoscopy;  Laterality: N/A;    No Known Allergies  Current Outpatient Prescriptions  Medication Sig Dispense Refill  . acetaminophen (TYLENOL) 325 MG tablet Take 650 mg by mouth every 6 (six) hours as needed for headache.     . albuterol (PROAIR HFA) 108 (90 BASE) MCG/ACT inhaler Inhale 2 puffs into the lungs every 4 (four) hours as needed for wheezing. 1 Inhaler 6  . ALPRAZolam (XANAX) 0.5 MG tablet Take 1 tablet (0.5 mg total) by mouth 2 (two) times daily as  needed. 60 tablet 2  . atorvastatin (LIPITOR) 80 MG tablet Take 1 tablet (80 mg total) by mouth daily at 6 PM. 90 tablet 3  . donepezil (ARICEPT) 5 MG tablet TAKE 1 TABLET (5 MG TOTAL) BY MOUTH AT BEDTIME. 30 tablet 11  . dronabinol (MARINOL) 10 MG capsule Take 10 mg by mouth 2 (two) times daily.  0  . fluticasone (FLONASE) 50 MCG/ACT nasal spray PLACE 2 SPRAYS INTO BOTH NOSTRILS DAILY. 16 g 11  . furosemide (LASIX) 40 MG tablet Take 1 tablet (40 mg total) by mouth daily. 30 tablet 6  . gabapentin (NEURONTIN) 300 MG capsule TAKE 1 CAPSULE BY MOUTH DAILY AT BEDTIME 30 capsule 5  . isosorbide mononitrate (IMDUR) 60 MG 24 hr tablet TAKE 1 TABLET (60 MG TOTAL) BY MOUTH EVERY EVENING. 30 tablet 10  . levocetirizine (XYZAL) 5 MG tablet Take 1 tablet (5 mg total) by mouth every evening. 30 tablet 11  . losartan (COZAAR) 50 MG tablet Take 50 mg by mouth daily.     . metoCLOPramide (REGLAN) 5 MG tablet TAKE 1 TABLET BY MOUTH TWICE A DAY 60 tablet 1  . metoprolol tartrate (LOPRESSOR) 25 MG tablet Take 1 tablet (25 mg total) by mouth 2 (two) times daily. 60 tablet 6  . mirtazapine (REMERON) 30 MG tablet Take 1 tablet (30 mg total) by mouth at bedtime. 30 tablet 5  . morphine (MS CONTIN) 30 MG 12 hr tablet Take 30 mg by mouth at bedtime.     . Multiple Vitamins-Minerals (ALIVE WOMENS 50+ PO) Take 1 tablet by mouth daily.    . Nutritional Supplements (COMPLETE PROTEIN/VITAMIN SHAKE PO) Take 2-3 Bottles by mouth daily as needed (nutritional supplment).     . ondansetron (ZOFRAN) 8 MG tablet Take 1 tablet by mouth 2 (two) times daily as needed for nausea or vomiting.   1  . oxyCODONE-acetaminophen (PERCOCET) 10-325 MG per tablet Take 1 tablet by mouth 5 (five) times daily as needed for pain.    . pantoprazole (PROTONIX) 40 MG tablet Take 40 mg by mouth 2 (two) times daily.  3  . tiotropium (SPIRIVA) 18 MCG inhalation capsule Place 1 capsule (18 mcg total) into inhaler and inhale daily. 30 capsule 12   No  current facility-administered medications for this visit.    Socially, she continues to smoke one pack of cigarettes per day. She is divorced with 6 children 13 grandchildren. She does not routinely exercise. There is no alcohol use.  ROS General: Positive significant weight loss HEENT: Negative; No changes in vision or hearing, sinus congestion, difficulty swallowing Pulmonary: Positive for shortness of breath shortness of breath, no hemoptysis Cardiovascular: Positive for palpitations and fast heartbeats which last for over a minute GI: Remote GI bleed No nausea, vomiting, diarrhea, or abdominal pain GU: Negative; No dysuria, hematuria, or difficulty voiding Musculoskeletal: Negative;  no myalgias, joint pain, or weakness Hematologic/Oncology: Negative; no easy bruising, bleeding Endocrine: Negative; no heat/cold intolerance; no diabetes Neuro: Positive for remote TIA Skin: Negative; No rashes or skin lesions Psychiatric: Negative; No behavioral problems, depression Sleep: Negative; No snoring, daytime sleepiness, hypersomnolence, bruxism, restless legs, hypnogognic hallucinations, no cataplexy Other comprehensive 14 point system review is negative.   PE BP 150/90 mmHg  Pulse 58  Ht 5\' 2"  (1.575 m)  Wt 92 lb 14.4 oz (42.139 kg)  BMI 16.99 kg/m2  General: Thin and cachectic in appearance. She smells like tobacco. HEENT: Normocephalic, atraumatic. Pupils round and reactive; sclera anicteric;  Nose without nasal septal hypertrophy Mouth/Parynx benign; Mallinpatti scale 2/3 Neck: No JVD, no carotid bruit Lungs: Decreased breath sounds diffusely; no wheezing or rales Chest wall: Nontender to palpation Heart: RRR, s1 s2 normal 1/6 systolic murmur. No S3 gallop.  No rubs thrills or heaves Abdomen: soft, nontender; no hepatosplenomehaly, BS+; abdominal aorta nontender and not dilated by palpation. Back: No CVA tenderness Pulses 2+ soft femoral bruits. Extremities: Positive for  bilateral ankle edema, Homan's sign negative  Neurologic: grossly nonfocal Psychological normal affect.  ECG (independently read by me): Sinus bradycardia 58 bpm.  LVH by voltage criteria.  QTC interval 422 ms  November 2015 ECG (independently read by me): Sinus rhythm at 65 bpm.  Normal intervals.  Poor progression V1 V2.  Prior June 2014 ECG: Sinus rhythm at 57 beats per minute without significant ST changes.  LABS:  BMET  BMP Latest Ref Rng 06/06/2014 05/06/2014 05/05/2014  Glucose 70 - 99 mg/dL 86 76 91  BUN 6 - 23 mg/dL 18 20 14   Creatinine 0.50 - 1.10 mg/dL 0.67 0.63 0.55  Sodium 135 - 145 mEq/L 140 144 142  Potassium 3.5 - 5.3 mEq/L 4.5 4.2 3.6(L)  Chloride 96 - 112 mEq/L 106 108 108  CO2 19 - 32 mEq/L 27 28 25   Calcium 8.4 - 10.5 mg/dL 9.3 8.6 8.9     Hepatic Function Panel     Component Value Date/Time   PROT 7.0 05/02/2014 0542   ALBUMIN 3.4* 05/02/2014 0542   AST 26 05/02/2014 0542   ALT 19 05/02/2014 0542   ALKPHOS 91 05/02/2014 0542   BILITOT 0.5 05/02/2014 0542     CBC    Component Value Date/Time   WBC 6.7 05/03/2014 0635   RBC 4.32 05/03/2014 0635   HGB 13.6 05/03/2014 0635   HCT 41.4 05/03/2014 0635   PLT 164 05/03/2014 0635   MCV 95.8 05/03/2014 0635   MCH 31.5 05/03/2014 0635   MCHC 32.9 05/03/2014 0635   RDW 13.9 05/03/2014 0635   LYMPHSABS 1.4 05/01/2014 1427   MONOABS 0.5 05/01/2014 1427   EOSABS 0.0 05/01/2014 1427   BASOSABS 0.0 05/01/2014 1427     BNP    Component Value Date/Time   PROBNP 582.1* 09/29/2013 1155    Lipid Panel     Component Value Date/Time   CHOL  03/20/2009 0220    112        ATP III CLASSIFICATION:  <200     mg/dL   Desirable  200-239  mg/dL   Borderline High  >=240    mg/dL   High          TRIG 104 03/20/2009 0220   HDL 33* 03/20/2009 0220   CHOLHDL 3.4 03/20/2009 0220   VLDL 21 03/20/2009 0220   LDLCALC  03/20/2009 0220    58        Total Cholesterol/HDL:CHD  Risk Coronary Heart Disease Risk  Table                     Men   Women  1/2 Average Risk   3.4   3.3  Average Risk       5.0   4.4  2 X Average Risk   9.6   7.1  3 X Average Risk  23.4   11.0        Use the calculated Patient Ratio above and the CHD Risk Table to determine the patient's CHD Risk.        ATP III CLASSIFICATION (LDL):  <100     mg/dL   Optimal  100-129  mg/dL   Near or Above                    Optimal  130-159  mg/dL   Borderline  160-189  mg/dL   High  >190     mg/dL   Very High     RADIOLOGY: Dg Chest 2 View  11/21/2012   *RADIOLOGY REPORT*  Clinical Data: Chest pain, hypertension  CHEST - 2 VIEW  Comparison: 10/26/2010  Findings: The heart and pulmonary vascularity are within normal limits.  Mild interstitial changes are noted of a chronic nature. No focal infiltrate or sizable effusion is seen.  Coronary stents are noted.  IMPRESSION: Chronic changes without acute abnormality.   Original Report Authenticated By: Inez Catalina, M.D.   Ct Head Wo Contrast  11/21/2012   *RADIOLOGY REPORT*  Clinical Data: Confusion.  CT HEAD WITHOUT CONTRAST  Technique:  Contiguous axial images were obtained from the base of the skull through the vertex without contrast.  Comparison: 10/26/2010  Findings: Bone windows demonstrate fluid in the right mastoid air cells on image 2/series 3, new since the prior.  Remainder paranasal sinuses clear.  Soft tissue windows demonstrate no  mass lesion, hemorrhage, hydrocephalus, acute infarct, intra-axial, or extra-axial fluid collection.  Mild to moderate low density in the periventricular white matter likely related to small vessel disease.  IMPRESSION:  1. No acute intracranial abnormality. 2.  New right-sided mastoid effusion.   Original Report Authenticated By: Abigail Miyamoto, M.D.      ASSESSMENT AND PLAN: Kathy Marshall is a 80 year old white female with a history of significant COPD, established coronary artery disease, who recently has developed progressive weight loss and  is undergoing evaluation for possible pulmonary Mycobacterium avium infection with planned bronchoscopy next week with Dr. Elsworth Soho.  She is 9 years status post stenting of her RCA with a 3.5x28 mm Cypher stent. Her last nuclear study continues to show normal perfusion.  She denies any anginal type symptoms. She complains of episodes of increasing heart rate, which occur several times per week and may last for greater than a minute.  She has been on metoprolol, tartrate 25 mg twice a day.  There is a history of wheezing and significant COPD for which she is on Spiriva.  She also is on losartan 50 mg.  Her blood pressure is stable.  I'm scheduling her to wear a 30 day [employer] monitor to further evaluate her and spells of tachycardia and palpitations.  She continues to be on lipid-lowering therapy with atorvastatin for hyperlipidemia in her coronary artery disease with target LDL less than 70.  She is on Aricept for memory issues.  She is unaware of any blood in her stool and denies any awareness of recent GI bleed.  I will  see her in 2 months for reevaluation or sooner if problems arise.   Time spent: 25 minutes  Troy Sine, MD, [facility]  08/17/2014 11:41 AM

## 2014-08-18 ENCOUNTER — Encounter: Payer: Self-pay | Admitting: *Deleted

## 2014-08-18 ENCOUNTER — Telehealth: Payer: Self-pay | Admitting: Cardiovascular Disease

## 2014-08-18 NOTE — Telephone Encounter (Signed)
Pt is wearing a monitor and she is going to have a procedure tomorrow morning at [facility]. Her question is can she remove this while having procedure and put it on after it is over?

## 2014-08-18 NOTE — Telephone Encounter (Signed)
Advised this was fine. Callerd voiced understanding.

## 2014-08-19 ENCOUNTER — Ambulatory Visit (HOSPITAL_COMMUNITY)
Admission: RE | Admit: 2014-08-19 | Discharge: 2014-08-19 | Disposition: A | Discharge status: Home / Self Care | Payer: Commercial Managed Care - HMO | Source: Ambulatory Visit | Attending: Pulmonary Disease | Admitting: Pulmonary Disease

## 2014-08-19 ENCOUNTER — Encounter (HOSPITAL_COMMUNITY)
Admission: RE | Disposition: A | Discharge status: Home / Self Care | Payer: Commercial Managed Care - HMO | Source: Ambulatory Visit | Attending: Pulmonary Disease

## 2014-08-19 ENCOUNTER — Ambulatory Visit (HOSPITAL_COMMUNITY): Payer: Commercial Managed Care - HMO

## 2014-08-19 DIAGNOSIS — F17210 Nicotine dependence, cigarettes, uncomplicated: Secondary | ICD-10-CM | POA: Diagnosis not present

## 2014-08-19 DIAGNOSIS — K219 Gastro-esophageal reflux disease without esophagitis: Secondary | ICD-10-CM | POA: Insufficient documentation

## 2014-08-19 DIAGNOSIS — F419 Anxiety disorder, unspecified: Secondary | ICD-10-CM | POA: Diagnosis not present

## 2014-08-19 DIAGNOSIS — J449 Chronic obstructive pulmonary disease, unspecified: Secondary | ICD-10-CM | POA: Diagnosis not present

## 2014-08-19 DIAGNOSIS — I2510 Atherosclerotic heart disease of native coronary artery without angina pectoris: Secondary | ICD-10-CM | POA: Insufficient documentation

## 2014-08-19 DIAGNOSIS — Z9889 Other specified postprocedural states: Secondary | ICD-10-CM

## 2014-08-19 DIAGNOSIS — I252 Old myocardial infarction: Secondary | ICD-10-CM | POA: Diagnosis not present

## 2014-08-19 DIAGNOSIS — I509 Heart failure, unspecified: Secondary | ICD-10-CM | POA: Diagnosis not present

## 2014-08-19 DIAGNOSIS — I480 Paroxysmal atrial fibrillation: Secondary | ICD-10-CM | POA: Diagnosis not present

## 2014-08-19 DIAGNOSIS — Z8673 Personal history of transient ischemic attack (TIA), and cerebral infarction without residual deficits: Secondary | ICD-10-CM | POA: Diagnosis not present

## 2014-08-19 DIAGNOSIS — I10 Essential (primary) hypertension: Secondary | ICD-10-CM | POA: Insufficient documentation

## 2014-08-19 DIAGNOSIS — I739 Peripheral vascular disease, unspecified: Secondary | ICD-10-CM | POA: Insufficient documentation

## 2014-08-19 DIAGNOSIS — R918 Other nonspecific abnormal finding of lung field: Secondary | ICD-10-CM | POA: Diagnosis not present

## 2014-08-19 DIAGNOSIS — G4733 Obstructive sleep apnea (adult) (pediatric): Secondary | ICD-10-CM | POA: Insufficient documentation

## 2014-08-19 DIAGNOSIS — J479 Bronchiectasis, uncomplicated: Secondary | ICD-10-CM | POA: Diagnosis present

## 2014-08-19 DIAGNOSIS — E780 Pure hypercholesterolemia: Secondary | ICD-10-CM | POA: Diagnosis not present

## 2014-08-19 HISTORY — PX: VIDEO BRONCHOSCOPY: SHX5072

## 2014-08-19 SURGERY — BRONCHOSCOPY, WITH FLUOROSCOPY
Anesthesia: Moderate Sedation | Laterality: Bilateral

## 2014-08-19 MED ORDER — MIDAZOLAM HCL 10 MG/2ML IJ SOLN
INTRAMUSCULAR | Status: DC | PRN
  Administered 2014-08-19 (×3): 1 mg via INTRAVENOUS

## 2014-08-19 MED ORDER — FENTANYL CITRATE 0.05 MG/ML IJ SOLN
INTRAMUSCULAR | Status: DC | PRN
  Administered 2014-08-19 (×3): 25 ug via INTRAVENOUS

## 2014-08-19 MED ORDER — LIDOCAINE HCL 1 % IJ SOLN
INTRAMUSCULAR | Status: DC | PRN
  Administered 2014-08-19: 6 mL via RESPIRATORY_TRACT

## 2014-08-19 MED ORDER — BUTAMBEN-TETRACAINE-BENZOCAINE 2-2-14 % EX AERO: 1.0000 | INHALATION_SPRAY | Freq: Once | CUTANEOUS | Status: DC

## 2014-08-19 MED ORDER — SODIUM CHLORIDE 0.9 % IV SOLN: Freq: Once | INTRAVENOUS | Status: DC

## 2014-08-19 MED ORDER — SODIUM CHLORIDE 0.9 % IV SOLN
INTRAVENOUS | Status: DC
  Administered 2014-08-19: 07:00:00 via INTRAVENOUS

## 2014-08-19 MED ORDER — MIDAZOLAM HCL 10 MG/2ML IJ SOLN
INTRAMUSCULAR | Status: AC
  Filled 2014-08-19: qty 4

## 2014-08-19 MED ORDER — LIDOCAINE HCL 2 % EX GEL: 1.0000 "application " | Freq: Once | CUTANEOUS | Status: DC

## 2014-08-19 MED ORDER — LIDOCAINE HCL 2 % EX GEL
CUTANEOUS | Status: DC | PRN
  Administered 2014-08-19: 1

## 2014-08-19 MED ORDER — FENTANYL CITRATE 0.05 MG/ML IJ SOLN
INTRAMUSCULAR | Status: AC
  Filled 2014-08-19: qty 4

## 2014-08-19 MED ORDER — ALBUTEROL SULFATE (2.5 MG/3ML) 0.083% IN NEBU: 2.5000 mg | INHALATION_SOLUTION | Freq: Once | RESPIRATORY_TRACT | Status: DC

## 2014-08-19 MED ORDER — PHENYLEPHRINE HCL 0.25 % NA SOLN
NASAL | Status: DC | PRN
  Administered 2014-08-19: 2 via NASAL

## 2014-08-19 MED ORDER — PHENYLEPHRINE HCL 0.25 % NA SOLN: 1.0000 | Freq: Four times a day (QID) | NASAL | Status: DC | PRN

## 2014-08-19 NOTE — Op Note (Signed)
Indication :Bronchiectasis in both lower lobes with tree in bud nodular opacities, which raised suspicion for atypical mycobacterial disease in this smoker. Written informed consent was obtained prior to the procedure. The risks of the procedure including coughing, bleeding and the small chance of lung puncture requiring chest tube were discussed in great detail. The benefits & alternatives including serial follow up were also discussed.  3 mg versed & 75 mcg fentnayl used in divided doses during the procedure Bronchoscope entered from the right nare. Upper airway nml Vocal cords showed nml appearance & motion. Trachea & bronchial tree examined to the subsegmental level. Mild amount of white secretions were noted. No endobronchial lesions seen. Trans bronchial biopsies x 5 were obtained from the RLL under fluoroscopy. BAL was also obtained from the RLL.  There was minimal coughing  during the procedure.   Trans bronchial biopsy in saline sent for afb culture A CXR will be performed to r/o presence of pneumothorax.  ALVA,RAKESH V.  [phone]

## 2014-08-19 NOTE — Discharge Instructions (Signed)
Flexible Bronchoscopy, Care After These instructions give you information on caring for yourself after your procedure. Your doctor may also give you more specific instructions. Call your doctor if you have any problems or questions after your procedure. HOME CARE  Do not eat or drink anything for 2 hours after your procedure. If you try to eat or drink before the medicine wears off, food or drink could go into your lungs. You could also burn yourself.  After 2 hours have passed and when you can cough and gag normally, you may eat soft food and drink liquids slowly.  The day after the test, you may eat your normal diet.  You may do your normal activities.  Keep all doctor visits. GET HELP RIGHT AWAY IF:  You get more and more short of breath.  You get light-headed.  You feel like you are going to pass out (faint).  You have chest pain.  You have new problems that worry you.  You cough up more than a little blood.  You cough up more blood than before. MAKE SURE YOU:  Understand these instructions.  Will watch your condition.  Will get help right away if you are not doing well or get worse. Document Released: 04/10/2009 Document Revised: 06/18/2013 Document Reviewed: 02/15/2013 [facility] Patient Information 2015 [place], [place]. This information is not intended to replace advice given to you by your health care provider. Make sure you discuss any questions you have with your health care provider.  Nothing to eat or drink until  10:15 am Today           08/19/2014

## 2014-08-19 NOTE — Interval H&P Note (Signed)
History and Physical Interval Note:  08/19/2014 7:46 AM  Kathy Marshall  has presented today for surgery, with the diagnosis of abnormal cxray  The various methods of treatment have been discussed with the patient and family. After consideration of risks, benefits and other options for treatment, the patient has consented to  Procedure(s): VIDEO BRONCHOSCOPY WITH FLUORO (Bilateral) as a surgical intervention .  The patient's history has been reviewed, patient examined, no change in status, stable for surgery.  I have reviewed the patient's chart and labs.  Questions were answered to the patient's satisfaction.     Rigoberto Noel MD

## 2014-08-19 NOTE — Progress Notes (Signed)
Video Bronchoscopy done   Intervention Bronchial washings done Intervention Bronchial Biopsy done  x2 Intervention Bronchial washing done

## 2014-08-19 NOTE — H&P (View-Only) (Signed)
Subjective:    Patient ID: Kathy Marshall, female    DOB: 12-03-1934, 80 y.o.   MRN: [id number]  HPI   PCP - [place]  80 year old smoker with COPD, is seen at the request of Dr. Luan Pulling, to consider pulmonary mycobacterial disease. She is accompanied by her daughter today.  She smokes a pack per day since her teenage years, more than 50 pack years. Her main complaint is weight loss-she has lost from 160 pounds to her current weight of 94 pounds over the last 2 years. She has absolutely no appetite . She has been started on dronabinol -and does is being titrated  She was hospitalized in 04/2014 and underwent CT chest and abdomen. This showed mild patchy right lower lobe opacity suspicious for pneumonia. There were areas of bronchiectasis in both lower lobes with tree in bud nodular opacities, which raised suspicion for atypical mycobacterial disease. She reports mild dyspnea on exertion, is able to walk around the house, does not go out much. She denies wheezing or frequent flares. She has an early morning cough. She was seen by her PCP on 1/14, noted to desaturate on exertion, but refused oxygen. She has a pulse oximeter at home-and states that her sats around in the low 90s. She was on Advair and Spiriva, this was changed to dulera recently. She has CAD status post stent to RCA in 2006. She has paroxysmal atrial fibrillation on Cardizem with good LV function. She takes Lasix every other day.   Past Medical History  Diagnosis Date  . Low back pain   . Bronchitis   . Chronic hoarseness   . Sleep apnea, obstructive   . Renal artery stenosis   . PVD (peripheral vascular disease)   . Hypercholesteremia   . Pain in limb   . COPD (chronic obstructive pulmonary disease)   . Benign essential hypertension   . Atherosclerosis     coronary vessel type  . Coronary artery disease   . Diverticulosis   . History of GI diverticular bleed   . History of esophagitis   . H. pylori infection     . Altered mental status   . Pneumonia   . TIA (transient ischemic attack) 07/2010  . Arthritis   . GERD (gastroesophageal reflux disease)   . History of blood clots   . Tubular adenoma 2012  . Heart murmur   . CHF (congestive heart failure)   . Anginal pain   . Myocardial infarct 1990's    "I've only had one" (12/06/2012)  . Exertional shortness of breath   . Daily headache   . Anxiety   . Gastric ulcer 07/2012    large; required clipping Archie Endo 08/23/2012 (12/06/2012)   Past Surgical History  Procedure Laterality Date  . Oophorectomy    . Coronary angioplasty with stent placement  2006    Cypher stent to RCA. On cath in 2008 stent described as widely patent.   . Esophagogastroduodenoscopy N/A 08/21/2012    Procedure: ESOPHAGOGASTRODUODENOSCOPY (EGD);  Surgeon: Lafayette Dragon, MD;  Location: [facility] ENDOSCOPY;  Service: Endoscopy;  Laterality: N/A;  . Vaginal hysterectomy    . Esophagogastroduodenoscopy N/A 12/07/2012    Procedure: ESOPHAGOGASTRODUODENOSCOPY (EGD);  Surgeon: Gatha Mayer, MD;  Location: [facility] ENDOSCOPY;  Service: Endoscopy;  Laterality: N/A;    No Known Allergies  History   Social History  . Marital Status: Divorced    Spouse Name: N/A  . Number of Children: 6  . Years of Education: N/A  Occupational History  . RETIRED    Social History Main Topics  . Smoking status: Current Every Day Smoker -- 1.00 packs/day for 60 years    Types: Cigarettes  . Smokeless tobacco: Never Used  . Alcohol Use: No     Comment: occasional  . Drug Use: No  . Sexual Activity: No   Other Topics Concern  . Not on file   Social History Narrative   4-6 cups of coffee daily     Family History  Problem Relation Age of Onset  . Heart disease Mother   . Stroke Mother   . Stomach cancer Mother   . Heart disease Father   . Stroke Father   . Heart disease Sister   . Colon cancer Neg Hx   . Lung cancer Sister      Review of Systems  Constitutional: Negative for fever and  unexpected weight change.  HENT: Negative for congestion, dental problem, ear pain, nosebleeds, postnasal drip, rhinorrhea, sinus pressure, sneezing, sore throat and trouble swallowing.   Eyes: Negative for redness and itching.  Respiratory: Negative for cough, chest tightness, shortness of breath and wheezing.   Cardiovascular: Negative for palpitations and leg swelling.  Gastrointestinal: Negative for nausea and vomiting.  Genitourinary: Negative for dysuria.  Musculoskeletal: Negative for joint swelling.  Skin: Negative for rash.  Neurological: Negative for headaches.  Hematological: Does not bruise/bleed easily.  Psychiatric/Behavioral: Negative for dysphoric mood. The patient is not nervous/anxious.        Objective:   Physical Exam  Gen. Pleasant, cachectic, in no distress ENT - no lesions, no post nasal drip Neck: No JVD, no thyromegaly, no carotid bruits Lungs: no use of accessory muscles, no dullness to percussion, decreased without rales or rhonchi  Cardiovascular: Rhythm regular, heart sounds  normal, no murmurs or gallops, no peripheral edema Musculoskeletal: No deformities, no cyanosis or clubbing        Assessment & Plan:

## 2014-08-20 ENCOUNTER — Encounter (HOSPITAL_COMMUNITY): Payer: Self-pay | Admitting: Pulmonary Disease

## 2014-08-20 ENCOUNTER — Institutional Professional Consult (permissible substitution): Payer: Commercial Managed Care - HMO | Admitting: Pulmonary Disease

## 2014-08-21 LAB — CULTURE, BAL-QUANTITATIVE: Special Requests: NORMAL

## 2014-08-21 LAB — CULTURE, BAL-QUANTITATIVE W GRAM STAIN

## 2014-09-01 ENCOUNTER — Ambulatory Visit (INDEPENDENT_AMBULATORY_CARE_PROVIDER_SITE_OTHER): Payer: Commercial Managed Care - HMO | Admitting: Family Medicine

## 2014-09-01 ENCOUNTER — Encounter: Payer: Self-pay | Admitting: Family Medicine

## 2014-09-01 ENCOUNTER — Other Ambulatory Visit: Payer: Self-pay | Admitting: Family Medicine

## 2014-09-01 VITALS — BP 136/78 | HR 72 | Temp 98.3°F | Resp 18 | Ht 62.0 in | Wt 89.0 lb

## 2014-09-01 DIAGNOSIS — R110 Nausea: Secondary | ICD-10-CM

## 2014-09-01 MED ORDER — METOCLOPRAMIDE HCL 10 MG PO TABS: 10.0000 mg | ORAL_TABLET | Freq: Three times a day (TID) | ORAL | Status: AC

## 2014-09-01 NOTE — Telephone Encounter (Signed)
rx called in

## 2014-09-01 NOTE — Progress Notes (Signed)
Subjective:    Patient ID: Kathy Marshall, female    DOB: 15-Apr-1935, 80 y.o.   MRN: [id number]  HPI Patient has a history of chronic stomach issues. Last year she had a bleeding ulcer and chronic gastritis is diagnosed on endoscopy. She was also found to have a moderate blockage in the celiac trunk possibly contributing to mesenteric ischemia. Patient is currently on Zofran as needed for nausea and Protonix for gastritis and ulcers. She is also currently taking Marinol and Remeron to try to stimulate her appetite. Unfortunately her weight is lower today at 89 pounds. Furthermore she reports 3 weeks of constant nausea. She reports dry heaves. She denies any vomiting. She denies any constipation. She denies any melena or hematochezia. She is having regular daily bowel movements. She denies any bloating or belching or increased abdominal gas she denies any fevers or chills. She is taking Zofran without any relief. She denies constipation on her chronic narcotics. Past Medical History  Diagnosis Date  . Low back pain   . Bronchitis   . Chronic hoarseness   . Sleep apnea, obstructive   . Renal artery stenosis   . PVD (peripheral vascular disease)   . Hypercholesteremia   . Pain in limb   . COPD (chronic obstructive pulmonary disease)   . Benign essential hypertension   . Atherosclerosis     coronary vessel type  . Coronary artery disease   . Diverticulosis   . History of GI diverticular bleed   . History of esophagitis   . H. pylori infection   . Altered mental status   . Pneumonia   . TIA (transient ischemic attack) 07/2010  . Arthritis   . GERD (gastroesophageal reflux disease)   . History of blood clots   . Tubular adenoma 2012  . Heart murmur   . CHF (congestive heart failure)   . Anginal pain   . Myocardial infarct 1990's    "I've only had one" (12/06/2012)  . Exertional shortness of breath   . Daily headache   . Anxiety   . Gastric ulcer 07/2012    large; required  clipping Archie Endo 08/23/2012 (12/06/2012)   Past Surgical History  Procedure Laterality Date  . Oophorectomy    . Coronary angioplasty with stent placement  2006    Cypher stent to RCA. On cath in 2008 stent described as widely patent.   . Esophagogastroduodenoscopy N/A 08/21/2012    Procedure: ESOPHAGOGASTRODUODENOSCOPY (EGD);  Surgeon: Lafayette Dragon, MD;  Location: [facility] ENDOSCOPY;  Service: Endoscopy;  Laterality: N/A;  . Vaginal hysterectomy    . Esophagogastroduodenoscopy N/A 12/07/2012    Procedure: ESOPHAGOGASTRODUODENOSCOPY (EGD);  Surgeon: Gatha Mayer, MD;  Location: [facility] ENDOSCOPY;  Service: Endoscopy;  Laterality: N/A;  . Video bronchoscopy Bilateral 08/19/2014    Procedure: VIDEO BRONCHOSCOPY WITH FLUORO;  Surgeon: Rigoberto Noel, MD;  Location: [facility] ENDOSCOPY;  Service: Cardiopulmonary;  Laterality: Bilateral;   Current Outpatient Prescriptions on File Prior to Visit  Medication Sig Dispense Refill  . acetaminophen (TYLENOL) 325 MG tablet Take 650 mg by mouth every 6 (six) hours as needed for headache.     . albuterol (PROAIR HFA) 108 (90 BASE) MCG/ACT inhaler Inhale 2 puffs into the lungs every 4 (four) hours as needed for wheezing. 1 Inhaler 6  . atorvastatin (LIPITOR) 80 MG tablet Take 1 tablet (80 mg total) by mouth daily at 6 PM. 90 tablet 3  . donepezil (ARICEPT) 5 MG tablet TAKE 1 TABLET (5 MG TOTAL)  BY MOUTH AT BEDTIME. 30 tablet 11  . dronabinol (MARINOL) 10 MG capsule Take 10 mg by mouth 2 (two) times daily.  0  . fluticasone (FLONASE) 50 MCG/ACT nasal spray PLACE 2 SPRAYS INTO BOTH NOSTRILS DAILY. 16 g 11  . furosemide (LASIX) 40 MG tablet Take 1 tablet (40 mg total) by mouth daily. 30 tablet 6  . gabapentin (NEURONTIN) 300 MG capsule TAKE 1 CAPSULE BY MOUTH DAILY AT BEDTIME 30 capsule 5  . isosorbide mononitrate (IMDUR) 60 MG 24 hr tablet TAKE 1 TABLET (60 MG TOTAL) BY MOUTH EVERY EVENING. 30 tablet 10  . levocetirizine (XYZAL) 5 MG tablet Take 1 tablet (5 mg total) by mouth  every evening. 30 tablet 11  . losartan (COZAAR) 50 MG tablet Take 50 mg by mouth daily.     . metoCLOPramide (REGLAN) 5 MG tablet TAKE 1 TABLET BY MOUTH TWICE A DAY 60 tablet 1  . metoprolol tartrate (LOPRESSOR) 25 MG tablet Take 1 tablet (25 mg total) by mouth 2 (two) times daily. 60 tablet 6  . mirtazapine (REMERON) 30 MG tablet Take 1 tablet (30 mg total) by mouth at bedtime. 30 tablet 5  . morphine (MS CONTIN) 30 MG 12 hr tablet Take 30 mg by mouth at bedtime.     . Multiple Vitamins-Minerals (ALIVE WOMENS 50+ PO) Take 1 tablet by mouth daily.    . Nutritional Supplements (COMPLETE PROTEIN/VITAMIN SHAKE PO) Take 2-3 Bottles by mouth daily as needed (nutritional supplment).     . ondansetron (ZOFRAN) 8 MG tablet Take 1 tablet by mouth 2 (two) times daily as needed for nausea or vomiting.   1  . oxyCODONE-acetaminophen (PERCOCET) 10-325 MG per tablet Take 1 tablet by mouth 5 (five) times daily as needed for pain.    . pantoprazole (PROTONIX) 40 MG tablet Take 40 mg by mouth 2 (two) times daily.  3  . tiotropium (SPIRIVA) 18 MCG inhalation capsule Place 1 capsule (18 mcg total) into inhaler and inhale daily. 30 capsule 12   No current facility-administered medications on file prior to visit.   No Known Allergies History   Social History  . Marital Status: Divorced    Spouse Name: N/A  . Number of Children: 6  . Years of Education: N/A   Occupational History  . RETIRED    Social History Main Topics  . Smoking status: Current Every Day Smoker -- 1.00 packs/day for 60 years    Types: Cigarettes  . Smokeless tobacco: Never Used  . Alcohol Use: No     Comment: occasional  . Drug Use: No  . Sexual Activity: No   Other Topics Concern  . Not on file   Social History Narrative   4-6 cups of coffee daily       Review of Systems  All other systems reviewed and are negative.      Objective:   Physical Exam  Constitutional: She appears well-developed and well-nourished.    Cardiovascular: Normal rate, regular rhythm and normal heart sounds.   No murmur heard. Pulmonary/Chest: Effort normal and breath sounds normal. No respiratory distress. She has no wheezes. She has no rales.  Abdominal: Soft. She exhibits no distension. Bowel sounds are decreased. There is no tenderness. There is no rebound and no guarding.  Vitals reviewed.         Assessment & Plan:  Nausea without vomiting - Plan: metoCLOPramide (REGLAN) 10 MG tablet  Differential diagnosis includes gastric outlet obstruction, gastritis, gastroparesis, ischemic bowel, irritable  bowel syndrome.  I will start the patient on Reglan 10 mg by mouth with meals. Recheck the patient on Thursday. Apparently the patient has stopped this in the past. If the patient's nausea improves, evaluate patient with gastric emptying study to evaluate for gastroparesis. If there is no improvement consider an EGD to rule out gastric outlet obstruction versus a referral to a vascular surgeon to evaluate for ischemic bowel disease. I also recommended that she decrease her consumption of narcotics as this is likely contributing.

## 2014-09-01 NOTE — Telephone Encounter (Signed)
ok 

## 2014-09-05 ENCOUNTER — Encounter: Payer: Self-pay | Admitting: Family Medicine

## 2014-09-05 ENCOUNTER — Ambulatory Visit (INDEPENDENT_AMBULATORY_CARE_PROVIDER_SITE_OTHER): Payer: Commercial Managed Care - HMO | Admitting: Family Medicine

## 2014-09-05 ENCOUNTER — Telehealth: Payer: Self-pay | Admitting: Family Medicine

## 2014-09-05 VITALS — BP 132/70 | HR 68 | Temp 98.0°F | Resp 20 | Ht 62.0 in | Wt 88.5 lb

## 2014-09-05 DIAGNOSIS — F32A Depression, unspecified: Secondary | ICD-10-CM

## 2014-09-05 DIAGNOSIS — F329 Major depressive disorder, single episode, unspecified: Secondary | ICD-10-CM

## 2014-09-05 DIAGNOSIS — R630 Anorexia: Secondary | ICD-10-CM | POA: Diagnosis not present

## 2014-09-05 MED ORDER — MEGESTROL ACETATE 400 MG/10ML PO SUSP: 400.0000 mg | Freq: Every day | ORAL | Status: DC

## 2014-09-05 MED ORDER — FLUOXETINE HCL 20 MG PO TABS: 20.0000 mg | ORAL_TABLET | Freq: Every day | ORAL | Status: AC

## 2014-09-05 NOTE — Progress Notes (Signed)
Subjective:    Patient ID: Kathy Marshall, female    DOB: 11-May-1935, 80 y.o.   MRN: [id number]  HPI 09/01/14  Patient has a history of chronic stomach issues. Last year she had a bleeding ulcer and chronic gastritis is diagnosed on endoscopy. She was also found to have a moderate blockage in the celiac trunk possibly contributing to mesenteric ischemia. Patient is currently on Zofran as needed for nausea and Protonix for gastritis and ulcers. She is also currently taking Marinol and Remeron to try to stimulate her appetite. Unfortunately her weight is lower today at 89 pounds. Furthermore she reports 3 weeks of constant nausea. She reports dry heaves. She denies any vomiting. She denies any constipation. She denies any melena or hematochezia. She is having regular daily bowel movements. She denies any bloating or belching or increased abdominal gas she denies any fevers or chills. She is taking Zofran without any relief. She denies constipation on her chronic narcotics.  At that time, my plan was: Differential diagnosis includes gastric outlet obstruction, gastritis, gastroparesis, ischemic bowel, irritable bowel syndrome.  I will start the patient on Reglan 10 mg by mouth with meals. Recheck the patient on Thursday. Apparently the patient has stopped this in the past. If the patient's nausea improves, evaluate patient with gastric emptying study to evaluate for gastroparesis. If there is no improvement consider an EGD to rule out gastric outlet obstruction versus a referral to a vascular surgeon to evaluate for ischemic bowel disease. I also recommended that she decrease her consumption of narcotics as this is likely contributing.  09/05/14 Patient is here for follow up.  She has lost 1 pound since I last saw her. She states that she has no desire to eat. The nausea has improved. She denies any abdominal pain. She denies any fevers or chills or constipation. However her daughter reports the patient  is severely depressed. She is been depressed ever since her significant other of over 30 years had an affair with her other daughter. This is caused severe depression and she believes this is contributing to her poor appetite. The patient is currently on Marinol and Remeron for appetite stimulant and depression but it does not seem to be helping Past Medical History  Diagnosis Date  . Low back pain   . Bronchitis   . Chronic hoarseness   . Sleep apnea, obstructive   . Renal artery stenosis   . PVD (peripheral vascular disease)   . Hypercholesteremia   . Pain in limb   . COPD (chronic obstructive pulmonary disease)   . Benign essential hypertension   . Atherosclerosis     coronary vessel type  . Coronary artery disease   . Diverticulosis   . History of GI diverticular bleed   . History of esophagitis   . H. pylori infection   . Altered mental status   . Pneumonia   . TIA (transient ischemic attack) 07/2010  . Arthritis   . GERD (gastroesophageal reflux disease)   . History of blood clots   . Tubular adenoma 2012  . Heart murmur   . CHF (congestive heart failure)   . Anginal pain   . Myocardial infarct 1990's    "I've only had one" (12/06/2012)  . Exertional shortness of breath   . Daily headache   . Anxiety   . Gastric ulcer 07/2012    large; required clipping Archie Endo 08/23/2012 (12/06/2012)   Past Surgical History  Procedure Laterality Date  . Oophorectomy    .  Coronary angioplasty with stent placement  2006    Cypher stent to RCA. On cath in 2008 stent described as widely patent.   . Esophagogastroduodenoscopy N/A 08/21/2012    Procedure: ESOPHAGOGASTRODUODENOSCOPY (EGD);  Surgeon: Lafayette Dragon, MD;  Location: [facility] ENDOSCOPY;  Service: Endoscopy;  Laterality: N/A;  . Vaginal hysterectomy    . Esophagogastroduodenoscopy N/A 12/07/2012    Procedure: ESOPHAGOGASTRODUODENOSCOPY (EGD);  Surgeon: Gatha Mayer, MD;  Location: [facility] ENDOSCOPY;  Service: Endoscopy;  Laterality: N/A;  .  Video bronchoscopy Bilateral 08/19/2014    Procedure: VIDEO BRONCHOSCOPY WITH FLUORO;  Surgeon: Rigoberto Noel, MD;  Location: [facility] ENDOSCOPY;  Service: Cardiopulmonary;  Laterality: Bilateral;   Current Outpatient Prescriptions on File Prior to Visit  Medication Sig Dispense Refill  . acetaminophen (TYLENOL) 325 MG tablet Take 650 mg by mouth every 6 (six) hours as needed for headache.     . albuterol (PROAIR HFA) 108 (90 BASE) MCG/ACT inhaler Inhale 2 puffs into the lungs every 4 (four) hours as needed for wheezing. 1 Inhaler 6  . ALPRAZolam (XANAX) 0.5 MG tablet TAKE 1 TABLET TWICE A DAY AS NEEDED *MUST LAST 30 DAYS* 60 tablet 2  . atorvastatin (LIPITOR) 80 MG tablet Take 1 tablet (80 mg total) by mouth daily at 6 PM. 90 tablet 3  . donepezil (ARICEPT) 5 MG tablet TAKE 1 TABLET (5 MG TOTAL) BY MOUTH AT BEDTIME. 30 tablet 11  . dronabinol (MARINOL) 10 MG capsule Take 10 mg by mouth 2 (two) times daily.  0  . fluticasone (FLONASE) 50 MCG/ACT nasal spray PLACE 2 SPRAYS INTO BOTH NOSTRILS DAILY. 16 g 11  . furosemide (LASIX) 40 MG tablet Take 1 tablet (40 mg total) by mouth daily. 30 tablet 6  . gabapentin (NEURONTIN) 300 MG capsule TAKE 1 CAPSULE BY MOUTH DAILY AT BEDTIME 30 capsule 5  . isosorbide mononitrate (IMDUR) 60 MG 24 hr tablet TAKE 1 TABLET (60 MG TOTAL) BY MOUTH EVERY EVENING. 30 tablet 10  . levocetirizine (XYZAL) 5 MG tablet Take 1 tablet (5 mg total) by mouth every evening. 30 tablet 11  . losartan (COZAAR) 50 MG tablet Take 50 mg by mouth daily.     . metoCLOPramide (REGLAN) 10 MG tablet Take 1 tablet (10 mg total) by mouth 3 (three) times daily before meals. 90 tablet 0  . metoCLOPramide (REGLAN) 5 MG tablet TAKE 1 TABLET BY MOUTH TWICE A DAY 60 tablet 1  . metoprolol tartrate (LOPRESSOR) 25 MG tablet Take 1 tablet (25 mg total) by mouth 2 (two) times daily. 60 tablet 6  . mirtazapine (REMERON) 30 MG tablet Take 1 tablet (30 mg total) by mouth at bedtime. 30 tablet 5  . morphine (MS  CONTIN) 30 MG 12 hr tablet Take 30 mg by mouth at bedtime.     . Multiple Vitamins-Minerals (ALIVE WOMENS 50+ PO) Take 1 tablet by mouth daily.    . Nutritional Supplements (COMPLETE PROTEIN/VITAMIN SHAKE PO) Take 2-3 Bottles by mouth daily as needed (nutritional supplment).     . ondansetron (ZOFRAN) 8 MG tablet Take 1 tablet by mouth 2 (two) times daily as needed for nausea or vomiting.   1  . oxyCODONE-acetaminophen (PERCOCET) 10-325 MG per tablet Take 1 tablet by mouth 5 (five) times daily as needed for pain.    . pantoprazole (PROTONIX) 40 MG tablet Take 40 mg by mouth 2 (two) times daily.  3  . tiotropium (SPIRIVA) 18 MCG inhalation capsule Place 1 capsule (18 mcg total) into  inhaler and inhale daily. 30 capsule 12   No current facility-administered medications on file prior to visit.   No Known Allergies History   Social History  . Marital Status: Divorced    Spouse Name: N/A  . Number of Children: 6  . Years of Education: N/A   Occupational History  . RETIRED    Social History Main Topics  . Smoking status: Current Every Day Smoker -- 1.00 packs/day for 60 years    Types: Cigarettes  . Smokeless tobacco: Never Used  . Alcohol Use: No     Comment: occasional  . Drug Use: No  . Sexual Activity: No   Other Topics Concern  . Not on file   Social History Narrative   4-6 cups of coffee daily       Review of Systems  All other systems reviewed and are negative.      Objective:   Physical Exam  Constitutional: She appears well-developed and well-nourished.  Cardiovascular: Normal rate, regular rhythm and normal heart sounds.   No murmur heard. Pulmonary/Chest: Effort normal and breath sounds normal. No respiratory distress. She has no wheezes. She has no rales.  Abdominal: Soft. She exhibits no distension. Bowel sounds are decreased. There is no tenderness. There is no rebound and no guarding.  Vitals reviewed.         Assessment & Plan:  Depression - Plan:  FLUoxetine (PROZAC) 20 MG tablet  Anorexia - Plan: megestrol (MEGACE) 400 MG/10ML suspension  Discontinue Remeron. Begin Megace 400 mg by mouth daily for appetite stimulant and also add Prozac 20 mg by mouth daily for depression. Recheck in one month or sooner if worse. I have also recommended that the patient begin consuming one can of ensure 3 times a day and also drank at least 2 Gatorade per day to maintain hydration and she appears to be malnourished and dehydrated.

## 2014-09-05 NOTE — Telephone Encounter (Signed)
PA submitted to [facility] through [email or web address]  - received a favorable outcome will fax to pharm

## 2014-09-08 ENCOUNTER — Other Ambulatory Visit: Payer: Self-pay | Admitting: Family Medicine

## 2014-09-09 ENCOUNTER — Telehealth: Payer: Self-pay | Admitting: Pulmonary Disease

## 2014-09-09 NOTE — Telephone Encounter (Signed)
Notes Recorded by Glean Hess, CMA on 08/20/2014 at 5:13 PM Patient notified. No questions or concerns at this time. Nothing further needed.  Notes Recorded by Rigoberto Noel, MD on 08/20/2014 at 3:58 PM Prelim results ne-- --  Called made pt daughter aware of above. Nothing further needed.

## 2014-09-10 ENCOUNTER — Ambulatory Visit: Payer: Commercial Managed Care - HMO | Admitting: Pulmonary Disease

## 2014-09-12 NOTE — Telephone Encounter (Signed)
Confirmation of approval

## 2014-09-15 LAB — FUNGUS CULTURE W SMEAR
Fungal Smear: NONE SEEN
Special Requests: NORMAL

## 2014-09-29 ENCOUNTER — Emergency Department (HOSPITAL_COMMUNITY): Payer: Commercial Managed Care - HMO

## 2014-09-29 ENCOUNTER — Inpatient Hospital Stay (HOSPITAL_COMMUNITY)
Admission: EM | Admit: 2014-09-29 | Discharge: 2014-10-01 | DRG: 086 | Disposition: A | Discharge status: Home Health | Payer: Commercial Managed Care - HMO | Attending: Internal Medicine | Admitting: Internal Medicine

## 2014-09-29 ENCOUNTER — Encounter (HOSPITAL_COMMUNITY): Payer: Self-pay

## 2014-09-29 DIAGNOSIS — Z66 Do not resuscitate: Secondary | ICD-10-CM | POA: Diagnosis not present

## 2014-09-29 DIAGNOSIS — E7800 Pure hypercholesterolemia, unspecified: Secondary | ICD-10-CM | POA: Diagnosis present

## 2014-09-29 DIAGNOSIS — G4733 Obstructive sleep apnea (adult) (pediatric): Secondary | ICD-10-CM | POA: Diagnosis present

## 2014-09-29 DIAGNOSIS — M1990 Unspecified osteoarthritis, unspecified site: Secondary | ICD-10-CM | POA: Diagnosis present

## 2014-09-29 DIAGNOSIS — Z801 Family history of malignant neoplasm of trachea, bronchus and lung: Secondary | ICD-10-CM

## 2014-09-29 DIAGNOSIS — S065X0A Traumatic subdural hemorrhage without loss of consciousness, initial encounter: Secondary | ICD-10-CM | POA: Diagnosis not present

## 2014-09-29 DIAGNOSIS — W19XXXA Unspecified fall, initial encounter: Secondary | ICD-10-CM | POA: Diagnosis present

## 2014-09-29 DIAGNOSIS — R410 Disorientation, unspecified: Secondary | ICD-10-CM | POA: Diagnosis not present

## 2014-09-29 DIAGNOSIS — Z8249 Family history of ischemic heart disease and other diseases of the circulatory system: Secondary | ICD-10-CM

## 2014-09-29 DIAGNOSIS — Z9181 History of falling: Secondary | ICD-10-CM

## 2014-09-29 DIAGNOSIS — F0390 Unspecified dementia without behavioral disturbance: Secondary | ICD-10-CM | POA: Diagnosis present

## 2014-09-29 DIAGNOSIS — S065X9A Traumatic subdural hemorrhage with loss of consciousness of unspecified duration, initial encounter: Secondary | ICD-10-CM | POA: Diagnosis present

## 2014-09-29 DIAGNOSIS — I10 Essential (primary) hypertension: Secondary | ICD-10-CM | POA: Diagnosis present

## 2014-09-29 DIAGNOSIS — I6200 Nontraumatic subdural hemorrhage, unspecified: Secondary | ICD-10-CM

## 2014-09-29 DIAGNOSIS — Z800 Family history of malignant neoplasm of digestive organs: Secondary | ICD-10-CM

## 2014-09-29 DIAGNOSIS — I2510 Atherosclerotic heart disease of native coronary artery without angina pectoris: Secondary | ICD-10-CM | POA: Diagnosis present

## 2014-09-29 DIAGNOSIS — J849 Interstitial pulmonary disease, unspecified: Secondary | ICD-10-CM | POA: Diagnosis present

## 2014-09-29 DIAGNOSIS — K219 Gastro-esophageal reflux disease without esophagitis: Secondary | ICD-10-CM | POA: Diagnosis present

## 2014-09-29 DIAGNOSIS — Z823 Family history of stroke: Secondary | ICD-10-CM

## 2014-09-29 DIAGNOSIS — Z8673 Personal history of transient ischemic attack (TIA), and cerebral infarction without residual deficits: Secondary | ICD-10-CM

## 2014-09-29 DIAGNOSIS — R6251 Failure to thrive (child): Secondary | ICD-10-CM | POA: Diagnosis present

## 2014-09-29 DIAGNOSIS — J449 Chronic obstructive pulmonary disease, unspecified: Secondary | ICD-10-CM | POA: Diagnosis present

## 2014-09-29 DIAGNOSIS — Z955 Presence of coronary angioplasty implant and graft: Secondary | ICD-10-CM

## 2014-09-29 DIAGNOSIS — T502X5A Adverse effect of carbonic-anhydrase inhibitors, benzothiadiazides and other diuretics, initial encounter: Secondary | ICD-10-CM | POA: Diagnosis present

## 2014-09-29 DIAGNOSIS — F17210 Nicotine dependence, cigarettes, uncomplicated: Secondary | ICD-10-CM | POA: Diagnosis present

## 2014-09-29 DIAGNOSIS — I5032 Chronic diastolic (congestive) heart failure: Secondary | ICD-10-CM | POA: Diagnosis present

## 2014-09-29 DIAGNOSIS — N179 Acute kidney failure, unspecified: Secondary | ICD-10-CM | POA: Diagnosis present

## 2014-09-29 DIAGNOSIS — S065XAA Traumatic subdural hemorrhage with loss of consciousness status unknown, initial encounter: Secondary | ICD-10-CM | POA: Diagnosis present

## 2014-09-29 DIAGNOSIS — I739 Peripheral vascular disease, unspecified: Secondary | ICD-10-CM | POA: Diagnosis present

## 2014-09-29 DIAGNOSIS — R627 Adult failure to thrive: Secondary | ICD-10-CM | POA: Diagnosis present

## 2014-09-29 LAB — COMPREHENSIVE METABOLIC PANEL
ALT: 22 U/L (ref 0–35)
AST: 23 U/L (ref 0–37)
Albumin: 3.8 g/dL (ref 3.5–5.2)
Alkaline Phosphatase: 74 U/L (ref 39–117)
Anion gap: 12 (ref 5–15)
BILIRUBIN TOTAL: 0.5 mg/dL (ref 0.3–1.2)
BUN: 32 mg/dL — ABNORMAL HIGH (ref 6–23)
CO2: 28 mmol/L (ref 19–32)
Calcium: 9.1 mg/dL (ref 8.4–10.5)
Chloride: 100 mmol/L (ref 96–112)
Creatinine, Ser: 1.59 mg/dL — ABNORMAL HIGH (ref 0.50–1.10)
GFR calc Af Amer: 35 mL/min — ABNORMAL LOW (ref >90)
GFR, EST NON AFRICAN AMERICAN: 30 mL/min — AB (ref >90)
Glucose, Bld: 121 mg/dL — ABNORMAL HIGH (ref 70–99)
POTASSIUM: 3.1 mmol/L — AB (ref 3.5–5.1)
SODIUM: 140 mmol/L (ref 135–145)
Total Protein: 7.1 g/dL (ref 6.0–8.3)

## 2014-09-29 LAB — CBC WITH DIFFERENTIAL/PLATELET
BASOS ABS: 0 10*3/uL (ref 0.0–0.1)
BASOS PCT: 0 % (ref 0–1)
EOS ABS: 0.2 10*3/uL (ref 0.0–0.7)
EOS PCT: 3 % (ref 0–5)
HCT: 39.8 % (ref 36.0–46.0)
Hemoglobin: 13.1 g/dL (ref 12.0–15.0)
LYMPHS ABS: 1.9 10*3/uL (ref 0.7–4.0)
Lymphocytes Relative: 24 % (ref 12–46)
MCH: 31.1 pg (ref 26.0–34.0)
MCHC: 32.9 g/dL (ref 30.0–36.0)
MCV: 94.5 fL (ref 78.0–100.0)
Monocytes Absolute: 0.6 10*3/uL (ref 0.1–1.0)
Monocytes Relative: 7 % (ref 3–12)
NEUTROS PCT: 66 % (ref 43–77)
Neutro Abs: 5.2 10*3/uL (ref 1.7–7.7)
Platelets: 157 10*3/uL (ref 150–400)
RBC: 4.21 MIL/uL (ref 3.87–5.11)
RDW: 12.8 % (ref 11.5–15.5)
WBC: 7.9 10*3/uL (ref 4.0–10.5)

## 2014-09-29 LAB — PROTIME-INR
INR: 1.03 (ref 0.00–1.49)
Prothrombin Time: 13.6 seconds (ref 11.6–15.2)

## 2014-09-29 LAB — I-STAT CG4 LACTIC ACID, ED: LACTIC ACID, VENOUS: 1.18 mmol/L (ref 0.5–2.0)

## 2014-09-29 MED ORDER — SODIUM CHLORIDE 0.9 % IJ SOLN
INTRAMUSCULAR | Status: AC
  Filled 2014-09-29: qty 30

## 2014-09-29 MED ORDER — SODIUM CHLORIDE 0.9 % IV SOLN
INTRAVENOUS | Status: DC
  Administered 2014-09-29: 21:00:00 via INTRAVENOUS

## 2014-09-29 MED ORDER — SODIUM CHLORIDE 0.9 % IJ SOLN
INTRAMUSCULAR | Status: AC
Start: 2014-09-29 — End: 2014-09-30
  Filled 2014-09-29: qty 500

## 2014-09-29 NOTE — ED Provider Notes (Signed)
CSN: [id number]     Arrival date & time 09/29/14  1721 History   First MD Initiated Contact with Patient 09/29/14 1739     Chief Complaint  Patient presents with  . Fall     (Consider location/radiation/quality/duration/timing/severity/associated sxs/prior Treatment) HPI Patient presents today as after mechanical fall with family members who provide history of present illness. Patient has a history of dementia, level V caveat. Apparently the patient was getting out of bed, fell onto her left side. No report of loss of consciousness. Since the fall patient has had increasing confusion, decreasing memory capacity, decreased appetite. No report of new vomiting, additional falls. Patient is ambulatory again. Patient specifically complains of pain in her left shoulder, left hip, neck, head.   Past Medical History  Diagnosis Date  . Low back pain   . Bronchitis   . Chronic hoarseness   . Sleep apnea, obstructive   . Renal artery stenosis   . PVD (peripheral vascular disease)   . Hypercholesteremia   . Pain in limb   . COPD (chronic obstructive pulmonary disease)   . Benign essential hypertension   . Atherosclerosis     coronary vessel type  . Coronary artery disease   . Diverticulosis   . History of GI diverticular bleed   . History of esophagitis   . H. pylori infection   . Altered mental status   . Pneumonia   . TIA (transient ischemic attack) 07/2010  . Arthritis   . GERD (gastroesophageal reflux disease)   . History of blood clots   . Tubular adenoma 2012  . Heart murmur   . CHF (congestive heart failure)   . Anginal pain   . Myocardial infarct 1990's    "I've only had one" (12/06/2012)  . Exertional shortness of breath   . Daily headache   . Anxiety   . Gastric ulcer 07/2012    large; required clipping Archie Endo 08/23/2012 (12/06/2012)   Past Surgical History  Procedure Laterality Date  . Oophorectomy    . Coronary angioplasty with stent placement  2006    Cypher  stent to RCA. On cath in 2008 stent described as widely patent.   . Esophagogastroduodenoscopy N/A 08/21/2012    Procedure: ESOPHAGOGASTRODUODENOSCOPY (EGD);  Surgeon: Lafayette Dragon, MD;  Location: [facility] ENDOSCOPY;  Service: Endoscopy;  Laterality: N/A;  . Vaginal hysterectomy    . Esophagogastroduodenoscopy N/A 12/07/2012    Procedure: ESOPHAGOGASTRODUODENOSCOPY (EGD);  Surgeon: Gatha Mayer, MD;  Location: [facility] ENDOSCOPY;  Service: Endoscopy;  Laterality: N/A;  . Video bronchoscopy Bilateral 08/19/2014    Procedure: VIDEO BRONCHOSCOPY WITH FLUORO;  Surgeon: Rigoberto Noel, MD;  Location: [facility] ENDOSCOPY;  Service: Cardiopulmonary;  Laterality: Bilateral;   Family History  Problem Relation Age of Onset  . Heart disease Mother   . Stroke Mother   . Stomach cancer Mother   . Heart disease Father   . Stroke Father   . Heart disease Sister   . Colon cancer Neg Hx   . Lung cancer Sister    History  Substance Use Topics  . Smoking status: Current Every Day Smoker -- 1.00 packs/day for 60 years    Types: Cigarettes  . Smokeless tobacco: Never Used  . Alcohol Use: No     Comment: occasional   OB History    No data available     Review of Systems  Unable to perform ROS: Dementia      Allergies  Review of patient's allergies indicates no  known allergies.  Home Medications   Prior to Admission medications   Medication Sig Start Date End Date Taking? Authorizing Provider  acetaminophen (TYLENOL) 325 MG tablet Take 650 mg by mouth every 6 (six) hours as needed for headache.    Yes Historical Provider, MD  ADVAIR HFA 45-21 MCG/ACT inhaler INHALE 2 PUFFS INTO THE LUNGS 2 TIMES DAILY AS NEEDED FOR WHEEZING AND SHORTNESS OF BREATH 09/08/14  Yes Susy Frizzle, MD  albuterol (PROAIR HFA) 108 (90 BASE) MCG/ACT inhaler Inhale 2 puffs into the lungs every 4 (four) hours as needed for wheezing. 07/03/14  Yes Susy Frizzle, MD  ALPRAZolam Duanne Moron) 0.5 MG tablet TAKE 1 TABLET TWICE A DAY AS NEEDED  *MUST LAST 30 DAYS* 09/01/14  Yes Susy Frizzle, MD  atorvastatin (LIPITOR) 80 MG tablet Take 1 tablet (80 mg total) by mouth daily at 6 PM. 05/20/14  Yes Troy Sine, MD  dronabinol (MARINOL) 10 MG capsule Take 10 mg by mouth 2 (two) times daily. 08/06/14  Yes Historical Provider, MD  FLUoxetine (PROZAC) 20 MG tablet Take 1 tablet (20 mg total) by mouth daily. 09/05/14  Yes Susy Frizzle, MD  fluticasone (FLONASE) 50 MCG/ACT nasal spray PLACE 2 SPRAYS INTO BOTH NOSTRILS DAILY.   Yes Susy Frizzle, MD  furosemide (LASIX) 40 MG tablet Take 1 tablet (40 mg total) by mouth daily. Patient taking differently: Take 40 mg by mouth daily as needed for fluid.  05/20/14  Yes Troy Sine, MD  gabapentin (NEURONTIN) 300 MG capsule TAKE 1 CAPSULE BY MOUTH DAILY AT BEDTIME 06/02/14  Yes Susy Frizzle, MD  isosorbide mononitrate (IMDUR) 60 MG 24 hr tablet TAKE 1 TABLET (60 MG TOTAL) BY MOUTH EVERY EVENING. 07/23/14  Yes Troy Sine, MD  levocetirizine (XYZAL) 5 MG tablet Take 1 tablet (5 mg total) by mouth every evening. 07/03/14  Yes Susy Frizzle, MD  losartan (COZAAR) 50 MG tablet Take 50 mg by mouth daily.  10/11/13  Yes Historical Provider, MD  metoCLOPramide (REGLAN) 10 MG tablet Take 1 tablet (10 mg total) by mouth 3 (three) times daily before meals. 09/01/14  Yes Susy Frizzle, MD  metoprolol tartrate (LOPRESSOR) 25 MG tablet Take 1 tablet (25 mg total) by mouth 2 (two) times daily. 05/06/14  Yes Kathie Dike, MD  morphine (MS CONTIN) 30 MG 12 hr tablet Take 30 mg by mouth every evening.  09/19/13  Yes Historical Provider, MD  Multiple Vitamins-Minerals (ALIVE WOMENS 50+ PO) Take 1 tablet by mouth daily.   Yes Historical Provider, MD  Nutritional Supplements (COMPLETE PROTEIN/VITAMIN SHAKE PO) Take 1 Bottle by mouth daily as needed (nutritional supplment).    Yes Historical Provider, MD  ondansetron (ZOFRAN) 8 MG tablet Take 1 tablet by mouth 2 (two) times daily as needed for nausea or  vomiting.  04/07/14  Yes Historical Provider, MD  oxyCODONE-acetaminophen (PERCOCET) 10-325 MG per tablet Take 1 tablet by mouth every 6 (six) hours as needed for pain.    Yes Historical Provider, MD  pantoprazole (PROTONIX) 40 MG tablet Take 40 mg by mouth 2 (two) times daily. 07/29/14  Yes Historical Provider, MD  tiotropium (SPIRIVA) 18 MCG inhalation capsule Place 1 capsule (18 mcg total) into inhaler and inhale daily. 05/06/14  Yes Kathie Dike, MD  megestrol (MEGACE) 400 MG/10ML suspension Take 10 mLs (400 mg total) by mouth daily. Patient not taking: Reported on 09/29/2014 09/05/14   Susy Frizzle, MD  mirtazapine (REMERON) 30 MG  tablet Take 1 tablet (30 mg total) by mouth at bedtime. Patient not taking: Reported on 09/29/2014 06/05/14   Susy Frizzle, MD   BP 109/53 mmHg  Pulse 47  Temp(Src) 97.4 F (36.3 C) (Oral)  Resp 16  Ht 5' (1.524 m)  Wt 85 lb (38.556 kg)  BMI 16.60 kg/m2  SpO2 97% Physical Exam  Constitutional: She is oriented to person, place, and time. She appears well-developed and well-nourished. She has a sickly appearance. No distress.  HENT:  Head: Normocephalic and atraumatic.    Eyes: Conjunctivae and EOM are normal.  Cardiovascular: Normal rate and regular rhythm.   Pulmonary/Chest: Effort normal and breath sounds normal. No stridor. No respiratory distress.  Abdominal: She exhibits no distension.  Musculoskeletal: She exhibits no edema.       Left shoulder: She exhibits tenderness, bony tenderness and pain. She exhibits no swelling, no effusion, no crepitus, no deformity, no laceration and no spasm.       Left elbow: Normal.       Left wrist: Normal.       Left hip: She exhibits tenderness and bony tenderness. She exhibits no swelling, no crepitus, no deformity and no laceration.  Patient can flex each hip independently, though she complains of pain in the left lateral hip with motion  Neurological: She is alert and oriented to person, place, and time. No  cranial nerve deficit.  Skin: Skin is warm and dry.  Psychiatric: She is slowed and withdrawn. Thought content is delusional. Cognition and memory are impaired.  Nursing note and vitals reviewed.   ED Course  Procedures (including critical care time) Labs Review Labs Reviewed  COMPREHENSIVE METABOLIC PANEL - Abnormal; Notable for the following:    Potassium 3.1 (*)    Glucose, Bld 121 (*)    BUN 32 (*)    Creatinine, Ser 1.59 (*)    GFR calc non Af Amer 30 (*)    GFR calc Af Amer 35 (*)    All other components within normal limits  CBC WITH DIFFERENTIAL/PLATELET  URINALYSIS, ROUTINE W REFLEX MICROSCOPIC  PROTIME-INR  I-STAT CG4 LACTIC ACID, ED    Imaging Review Ct Head Wo Contrast  09/29/2014   CLINICAL DATA:  Falling since last Friday with remote bruising around the left eye. Initial encounter.  EXAM: CT HEAD WITHOUT CONTRAST  CT CERVICAL SPINE WITHOUT CONTRAST  TECHNIQUE: Multidetector CT imaging of the head and cervical spine was performed following the standard protocol without intravenous contrast. Multiplanar CT image reconstructions of the cervical spine were also generated.  COMPARISON:  Head CT 11/21/2012.  Cervical spine CT 08/10/2010  FINDINGS: CT HEAD FINDINGS  Skull and Sinuses:Negative for fracture or destructive process. The mastoids, middle ears, and imaged paranasal sinuses are clear.  Orbits: No acute abnormality.  Brain: Thin acute subdural hematoma around the right frontal convexity measuring up to 4 mm. There is no mass effect on the underlying brain. Thin extra-axial hematoma over the high left frontal convexity measuring 4 mm in maximal thickness. There is no mass effect on the underlying brain. There is new intermediate to low-density expansion of the subdural space around the majority of the left cerebral convexity, consistent with hygroma mixing/subacute hematoma. Subtle sulcal density and left parietal region, suspicious for trace subarachnoid hemorrhage. No  evidence of parenchymal hemorrhage or acute infarct. There is extensive chronic small vessel disease with ischemic gliosis throughout the bilateral periventricular white matter. There is a remote right caudate lacunar infarct and a newly  developed but established small vessel infarct around the left lateral ventricle. No hydrocephalus  CT CERVICAL SPINE FINDINGS  T1 compression fracture with mild height loss, new from 2012 but appears chronic. No subluxation or cervical spine fracture. No gross cervical canal hematoma or prevertebral edema. For age, no notable degenerative change.  Critical Value/emergent results were called by telephone at the time of interpretation on 09/29/2014 at 6:55 pm to Dr. Carmin Muskrat , who verbally acknowledged these results.  IMPRESSION: 1. Mixed density/age subdural hematoma around the left cerebral convexity, up to 4 mm in thickness. 2. 4 mm right frontal subdural hematoma. 3. Suspect trace subarachnoid hemorrhage in the left parietal region. 4. Mild T1 compression fracture is new from 2012 but chronic in appearance. 5. Chronic small vessel disease with progression from 2014.   Electronically Signed   By: Monte Fantasia M.D.   On: 09/29/2014 18:59   Ct Cervical Spine Wo Contrast  09/29/2014   CLINICAL DATA:  Falling since last Friday with remote bruising around the left eye. Initial encounter.  EXAM: CT HEAD WITHOUT CONTRAST  CT CERVICAL SPINE WITHOUT CONTRAST  TECHNIQUE: Multidetector CT imaging of the head and cervical spine was performed following the standard protocol without intravenous contrast. Multiplanar CT image reconstructions of the cervical spine were also generated.  COMPARISON:  Head CT 11/21/2012.  Cervical spine CT 08/10/2010  FINDINGS: CT HEAD FINDINGS  Skull and Sinuses:Negative for fracture or destructive process. The mastoids, middle ears, and imaged paranasal sinuses are clear.  Orbits: No acute abnormality.  Brain: Thin acute subdural hematoma around the  right frontal convexity measuring up to 4 mm. There is no mass effect on the underlying brain. Thin extra-axial hematoma over the high left frontal convexity measuring 4 mm in maximal thickness. There is no mass effect on the underlying brain. There is new intermediate to low-density expansion of the subdural space around the majority of the left cerebral convexity, consistent with hygroma mixing/subacute hematoma. Subtle sulcal density and left parietal region, suspicious for trace subarachnoid hemorrhage. No evidence of parenchymal hemorrhage or acute infarct. There is extensive chronic small vessel disease with ischemic gliosis throughout the bilateral periventricular white matter. There is a remote right caudate lacunar infarct and a newly developed but established small vessel infarct around the left lateral ventricle. No hydrocephalus  CT CERVICAL SPINE FINDINGS  T1 compression fracture with mild height loss, new from 2012 but appears chronic. No subluxation or cervical spine fracture. No gross cervical canal hematoma or prevertebral edema. For age, no notable degenerative change.  Critical Value/emergent results were called by telephone at the time of interpretation on 09/29/2014 at 6:55 pm to Dr. Carmin Muskrat , who verbally acknowledged these results.  IMPRESSION: 1. Mixed density/age subdural hematoma around the left cerebral convexity, up to 4 mm in thickness. 2. 4 mm right frontal subdural hematoma. 3. Suspect trace subarachnoid hemorrhage in the left parietal region. 4. Mild T1 compression fracture is new from 2012 but chronic in appearance. 5. Chronic small vessel disease with progression from 2014.   Electronically Signed   By: Monte Fantasia M.D.   On: 09/29/2014 18:59   Dg Shoulder Left  09/29/2014   CLINICAL DATA:  Multiple falls since last Friday. Left shoulder pain. Initial encounter.  EXAM: LEFT SHOULDER - 2+ VIEW  COMPARISON:  None.  FINDINGS: Limited axillary positioning. There is no  evidence of fracture or dislocation. There is no evidence of significant arthropathy or other focal bone abnormality.  IMPRESSION: Negative.  Electronically Signed   By: Monte Fantasia M.D.   On: 09/29/2014 19:30   Dg Hip Unilat With Pelvis 2-3 Views Left  09/29/2014   CLINICAL DATA:  [place] last Friday.  Left hip pain.  EXAM: LEFT HIP (WITH PELVIS) 2-3 VIEWS  COMPARISON:  None.  FINDINGS: Both hips are normally located. No acute fracture is identified. No plain film evidence of avascular necrosis. Mild degenerative changes for age. The pubic symphysis and SI joints are intact. No definite pelvic fractures.  IMPRESSION: No acute bony findings.   Electronically Signed   By: Marijo Sanes M.D.   On: 09/29/2014 19:30   I discussed patient's case radiology findings with our radiologist.  Subsequent, on repeat exam the patient remained in similar condition.  I discussed patient's case with our neurosurgical colleague, Dr. Annette Stable. He advises that the patient has no indication for emergent surgery, and appropriately remain at this facility, to have repeat CT scan tomorrow. If the CT scan does not show progression of the disease, she does not require transfer to our affiliated facility for neurosurgical evaluation.  I conveyed this to the family, voices understanding. Family also is aware of the patient's acute kidney injury, dehydration, without a lateral eye changes. Patient will continue to receive fluid rehydration.    EKG Interpretation   Date/Time:  Monday September 29 2014 17:39:15 EDT Ventricular Rate:  47 PR Interval:  206 QRS Duration: 90 QT Interval:  436 QTC Calculation: 385 R Axis:   64 Text Interpretation:  Sinus bradycardia Ventricular premature complex  Anteroseptal infarct, old Sinus bradycardia Artifact Premature ventricular  complexes abnm Confirmed by Carmin Muskrat  MD [phone]) on 09/29/2014  6:29:47 PM     Update: Patient remains in similar condition, disoriented, but  hemodynamically stable. MDM   Final diagnoses:  Fall  SDH (subdural hematoma)  AKI (acute kidney injury)    Patient presents after fall occurred 2 nights ago, with increased memory loss, confusion. Here the patient has evidence for acute kidney injury, with a doubling of creatinine, as well as no evidence for intracranial hemorrhage. After discussion with our neurosurgical team, continued fluid resuscitation, patient required admission to the stepdown unit for further evaluation and management.  CRITICAL CARE Performed by: Carmin Muskrat Total critical care time: 35 Critical care time was exclusive of separately billable procedures and treating other patients. Critical care was necessary to treat or prevent imminent or life-threatening deterioration. Critical care was time spent personally by me on the following activities: development of treatment plan with patient and/or surrogate as well as nursing, discussions with consultants, evaluation of patient's response to treatment, examination of patient, obtaining history from patient or surrogate, ordering and performing treatments and interventions, ordering and review of laboratory studies, ordering and review of radiographic studies, pulse oximetry and re-evaluation of patient's condition.     Carmin Muskrat, MD 09/29/14 2215

## 2014-09-29 NOTE — ED Notes (Signed)
Pt states she has been falling around since last Friday. Complain of pain in her left shoulder and left hip with movement, pt has old bruising to left eye from a previous fall

## 2014-09-29 NOTE — H&P (Addendum)
[facility] History and Physical  Kathy Marshall JJO:[id number] DOB: September 06, 1934    PCP:   Odette Fraction, MD   Chief Complaint: bilateral subdural hematoma's after a fall at least 36 hours ago.  HPI: Kathy Marshall is an 80 y.o. female with hx dementia, COPD, HTN, hx of RAS, CHF, anxiety, brought to the ER 36 hours after a fall.  She did not recall the event.  Family stated she has been more confused than her usual baseline dementia.  Head CT in the ER showed bilateral subdural hematomas, and trace subarachnoid intracranial hemorrhage.  Neurosurgery consultation was done by ED, and recommended no surgical intervention, to obtain a follow up CT in 24 hours, hospitalist was asked to admit her for observation, and to follow up head CT tomorrow.  When I saw her, she was alert, conversing, with bradycardia HR 40's to 50's, and BP 106. She did not recall any fall whatsover.   Rewiew of Systems:  Constitutional: Negative for malaise, fever and chills. No significant weight loss or weight gain Eyes: Negative for eye pain, redness and discharge, diplopia, visual changes, or flashes of light. ENMT: Negative for ear pain, hoarseness, nasal congestion, sinus pressure and sore throat. No headaches; tinnitus, drooling, or problem swallowing. Cardiovascular: Negative for chest pain, palpitations, diaphoresis, dyspnea and peripheral edema. ; No orthopnea, PND Respiratory: Negative for cough, hemoptysis, wheezing and stridor. No pleuritic chestpain. Gastrointestinal: Negative for nausea, vomiting, diarrhea, constipation, abdominal pain, melena, blood in stool, hematemesis, jaundice and rectal bleeding.    Genitourinary: Negative for frequency, dysuria, incontinence,flank pain and hematuria; Musculoskeletal: Negative for back pain and neck pain. Negative for swelling and trauma.;  Skin: . Negative for pruritus, rash, abrasions, bruising and skin lesion.; ulcerations Neuro: Negative for  headache, lightheadedness and neck stiffness. Negative for weakness, altered level of consciousness , altered mental status, extremity weakness, burning feet, involuntary movement, seizure and syncope.  Psych: negative for anxiety, depression, insomnia, tearfulness, panic attacks, hallucinations, paranoia, suicidal or homicidal ideation    Past Medical History  Diagnosis Date  . Low back pain   . Bronchitis   . Chronic hoarseness   . Sleep apnea, obstructive   . Renal artery stenosis   . PVD (peripheral vascular disease)   . Hypercholesteremia   . Pain in limb   . COPD (chronic obstructive pulmonary disease)   . Benign essential hypertension   . Atherosclerosis     coronary vessel type  . Coronary artery disease   . Diverticulosis   . History of GI diverticular bleed   . History of esophagitis   . H. pylori infection   . Altered mental status   . Pneumonia   . TIA (transient ischemic attack) 07/2010  . Arthritis   . GERD (gastroesophageal reflux disease)   . History of blood clots   . Tubular adenoma 2012  . Heart murmur   . CHF (congestive heart failure)   . Anginal pain   . Myocardial infarct 1990's    "I've only had one" (12/06/2012)  . Exertional shortness of breath   . Daily headache   . Anxiety   . Gastric ulcer 07/2012    large; required clipping Archie Endo 08/23/2012 (12/06/2012)    Past Surgical History  Procedure Laterality Date  . Oophorectomy    . Coronary angioplasty with stent placement  2006    Cypher stent to RCA. On cath in 2008 stent described as widely patent.   . Esophagogastroduodenoscopy N/A 08/21/2012    Procedure:  ESOPHAGOGASTRODUODENOSCOPY (EGD);  Surgeon: Lafayette Dragon, MD;  Location: [facility] ENDOSCOPY;  Service: Endoscopy;  Laterality: N/A;  . Vaginal hysterectomy    . Esophagogastroduodenoscopy N/A 12/07/2012    Procedure: ESOPHAGOGASTRODUODENOSCOPY (EGD);  Surgeon: Gatha Mayer, MD;  Location: [facility] ENDOSCOPY;  Service: Endoscopy;  Laterality: N/A;  .  Video bronchoscopy Bilateral 08/19/2014    Procedure: VIDEO BRONCHOSCOPY WITH FLUORO;  Surgeon: Rigoberto Noel, MD;  Location: [facility] ENDOSCOPY;  Service: Cardiopulmonary;  Laterality: Bilateral;    Medications:  HOME MEDS: Prior to Admission medications   Medication Sig Start Date End Date Taking? Authorizing Provider  acetaminophen (TYLENOL) 325 MG tablet Take 650 mg by mouth every 6 (six) hours as needed for headache.    Yes Historical Provider, MD  ADVAIR HFA 45-21 MCG/ACT inhaler INHALE 2 PUFFS INTO THE LUNGS 2 TIMES DAILY AS NEEDED FOR WHEEZING AND SHORTNESS OF BREATH 09/08/14  Yes Susy Frizzle, MD  albuterol (PROAIR HFA) 108 (90 BASE) MCG/ACT inhaler Inhale 2 puffs into the lungs every 4 (four) hours as needed for wheezing. 07/03/14  Yes Susy Frizzle, MD  ALPRAZolam Duanne Moron) 0.5 MG tablet TAKE 1 TABLET TWICE A DAY AS NEEDED *MUST LAST 30 DAYS* 09/01/14  Yes Susy Frizzle, MD  atorvastatin (LIPITOR) 80 MG tablet Take 1 tablet (80 mg total) by mouth daily at 6 PM. 05/20/14  Yes Troy Sine, MD  dronabinol (MARINOL) 10 MG capsule Take 10 mg by mouth 2 (two) times daily. 08/06/14  Yes Historical Provider, MD  FLUoxetine (PROZAC) 20 MG tablet Take 1 tablet (20 mg total) by mouth daily. 09/05/14  Yes Susy Frizzle, MD  fluticasone (FLONASE) 50 MCG/ACT nasal spray PLACE 2 SPRAYS INTO BOTH NOSTRILS DAILY.   Yes Susy Frizzle, MD  furosemide (LASIX) 40 MG tablet Take 1 tablet (40 mg total) by mouth daily. Patient taking differently: Take 40 mg by mouth daily as needed for fluid.  05/20/14  Yes Troy Sine, MD  gabapentin (NEURONTIN) 300 MG capsule TAKE 1 CAPSULE BY MOUTH DAILY AT BEDTIME 06/02/14  Yes Susy Frizzle, MD  isosorbide mononitrate (IMDUR) 60 MG 24 hr tablet TAKE 1 TABLET (60 MG TOTAL) BY MOUTH EVERY EVENING. 07/23/14  Yes Troy Sine, MD  levocetirizine (XYZAL) 5 MG tablet Take 1 tablet (5 mg total) by mouth every evening. 07/03/14  Yes Susy Frizzle, MD  losartan  (COZAAR) 50 MG tablet Take 50 mg by mouth daily.  10/11/13  Yes Historical Provider, MD  metoCLOPramide (REGLAN) 10 MG tablet Take 1 tablet (10 mg total) by mouth 3 (three) times daily before meals. 09/01/14  Yes Susy Frizzle, MD  metoprolol tartrate (LOPRESSOR) 25 MG tablet Take 1 tablet (25 mg total) by mouth 2 (two) times daily. 05/06/14  Yes Kathie Dike, MD  morphine (MS CONTIN) 30 MG 12 hr tablet Take 30 mg by mouth every evening.  09/19/13  Yes Historical Provider, MD  Multiple Vitamins-Minerals (ALIVE WOMENS 50+ PO) Take 1 tablet by mouth daily.   Yes Historical Provider, MD  Nutritional Supplements (COMPLETE PROTEIN/VITAMIN SHAKE PO) Take 1 Bottle by mouth daily as needed (nutritional supplment).    Yes Historical Provider, MD  ondansetron (ZOFRAN) 8 MG tablet Take 1 tablet by mouth 2 (two) times daily as needed for nausea or vomiting.  04/07/14  Yes Historical Provider, MD  oxyCODONE-acetaminophen (PERCOCET) 10-325 MG per tablet Take 1 tablet by mouth every 6 (six) hours as needed for pain.    Yes  Historical Provider, MD  pantoprazole (PROTONIX) 40 MG tablet Take 40 mg by mouth 2 (two) times daily. 07/29/14  Yes Historical Provider, MD  tiotropium (SPIRIVA) 18 MCG inhalation capsule Place 1 capsule (18 mcg total) into inhaler and inhale daily. 05/06/14  Yes Kathie Dike, MD  megestrol (MEGACE) 400 MG/10ML suspension Take 10 mLs (400 mg total) by mouth daily. Patient not taking: Reported on 09/29/2014 09/05/14   Susy Frizzle, MD  mirtazapine (REMERON) 30 MG tablet Take 1 tablet (30 mg total) by mouth at bedtime. Patient not taking: Reported on 09/29/2014 06/05/14   Susy Frizzle, MD     Allergies:  No Known Allergies  Social History:   reports that she has been smoking Cigarettes.  She has a 60 pack-year smoking history. She has never used smokeless tobacco. She reports that she does not drink alcohol or use illicit drugs.  Family History: Family History  Problem Relation Age of  Onset  . Heart disease Mother   . Stroke Mother   . Stomach cancer Mother   . Heart disease Father   . Stroke Father   . Heart disease Sister   . Colon cancer Neg Hx   . Lung cancer Sister      Physical Exam: Filed Vitals:   09/29/14 1728 09/29/14 2100  BP: 109/53 106/55  Pulse: 47 44  Temp: 97.4 F (36.3 C)   TempSrc: Oral   Resp: 16 12  Height: 5' (1.524 m)   Weight: 38.556 kg (85 lb)   SpO2: 97% 97%   Blood pressure 106/55, pulse 44, temperature 97.4 F (36.3 C), temperature source Oral, resp. rate 12, height 5' (1.524 m), weight 38.556 kg (85 lb), SpO2 97 %.  GEN:  Pleasant  patient lying in the stretcher in no acute distress; cooperative with exam. PSYCH:  alert ; does not appear anxious or depressed; affect is appropriate. HEENT: Mucous membranes pink and anicteric; PERRLA; EOM intact; no cervical lymphadenopathy nor thyromegaly or carotid bruit; no JVD; There were no stridor. Neck is very supple. She has a contution in the peri-orbital area of the left eye.  Breasts:: Not examined CHEST WALL: No tenderness CHEST: Normal respiration, clear to auscultation bilaterally.  HEART: Regular rate and rhythm.  There are no murmur, rub, or gallops.   BACK: No kyphosis or scoliosis; no CVA tenderness ABDOMEN: soft and non-tender; no masses, no organomegaly, normal abdominal bowel sounds; no pannus; no intertriginous candida. There is no rebound and no distention. Rectal Exam: Not done EXTREMITIES: No bone or joint deformity; age-appropriate arthropathy of the hands and knees; no edema; no ulcerations.  There is no calf tenderness. Genitalia: not examined PULSES: 2+ and symmetric SKIN: Normal hydration no rash or ulceration CNS: Cranial nerves 2-12 grossly intact no focal lateralizing neurologic deficit.  Speech is fluent; uvula elevated with phonation, facial symmetry and tongue midline. DTR are normal bilaterally, cerebella exam is intact, barbinski is negative and strengths are  equaled bilaterally.  No sensory loss.   Labs on Admission:  Basic Metabolic Panel:  Recent Labs Lab 09/29/14 1904  NA 140  K 3.1*  CL 100  CO2 28  GLUCOSE 121*  BUN 32*  CREATININE 1.59*  CALCIUM 9.1   Liver Function Tests:  Recent Labs Lab 09/29/14 1904  AST 23  ALT 22  ALKPHOS 74  BILITOT 0.5  PROT 7.1  ALBUMIN 3.8   No results for input(s): LIPASE, AMYLASE in the last 168 hours. No results for input(s): AMMONIA in  the last 168 hours. CBC:  Recent Labs Lab 09/29/14 1904  WBC 7.9  NEUTROABS 5.2  HGB 13.1  HCT 39.8  MCV 94.5  PLT 157    Radiological Exams on Admission: Ct Head Wo Contrast  09/29/2014   CLINICAL DATA:  Falling since last Friday with remote bruising around the left eye. Initial encounter.  EXAM: CT HEAD WITHOUT CONTRAST  CT CERVICAL SPINE WITHOUT CONTRAST  TECHNIQUE: Multidetector CT imaging of the head and cervical spine was performed following the standard protocol without intravenous contrast. Multiplanar CT image reconstructions of the cervical spine were also generated.  COMPARISON:  Head CT 11/21/2012.  Cervical spine CT 08/10/2010  FINDINGS: CT HEAD FINDINGS  Skull and Sinuses:Negative for fracture or destructive process. The mastoids, middle ears, and imaged paranasal sinuses are clear.  Orbits: No acute abnormality.  Brain: Thin acute subdural hematoma around the right frontal convexity measuring up to 4 mm. There is no mass effect on the underlying brain. Thin extra-axial hematoma over the high left frontal convexity measuring 4 mm in maximal thickness. There is no mass effect on the underlying brain. There is new intermediate to low-density expansion of the subdural space around the majority of the left cerebral convexity, consistent with hygroma mixing/subacute hematoma. Subtle sulcal density and left parietal region, suspicious for trace subarachnoid hemorrhage. No evidence of parenchymal hemorrhage or acute infarct. There is extensive chronic  small vessel disease with ischemic gliosis throughout the bilateral periventricular white matter. There is a remote right caudate lacunar infarct and a newly developed but established small vessel infarct around the left lateral ventricle. No hydrocephalus  CT CERVICAL SPINE FINDINGS  T1 compression fracture with mild height loss, new from 2012 but appears chronic. No subluxation or cervical spine fracture. No gross cervical canal hematoma or prevertebral edema. For age, no notable degenerative change.  Critical Value/emergent results were called by telephone at the time of interpretation on 09/29/2014 at 6:55 pm to Dr. Carmin Muskrat , who verbally acknowledged these results.  IMPRESSION: 1. Mixed density/age subdural hematoma around the left cerebral convexity, up to 4 mm in thickness. 2. 4 mm right frontal subdural hematoma. 3. Suspect trace subarachnoid hemorrhage in the left parietal region. 4. Mild T1 compression fracture is new from 2012 but chronic in appearance. 5. Chronic small vessel disease with progression from 2014.   Electronically Signed   By: Monte Fantasia M.D.   On: 09/29/2014 18:59   Ct Cervical Spine Wo Contrast  09/29/2014   CLINICAL DATA:  Falling since last Friday with remote bruising around the left eye. Initial encounter.  EXAM: CT HEAD WITHOUT CONTRAST  CT CERVICAL SPINE WITHOUT CONTRAST  TECHNIQUE: Multidetector CT imaging of the head and cervical spine was performed following the standard protocol without intravenous contrast. Multiplanar CT image reconstructions of the cervical spine were also generated.  COMPARISON:  Head CT 11/21/2012.  Cervical spine CT 08/10/2010  FINDINGS: CT HEAD FINDINGS  Skull and Sinuses:Negative for fracture or destructive process. The mastoids, middle ears, and imaged paranasal sinuses are clear.  Orbits: No acute abnormality.  Brain: Thin acute subdural hematoma around the right frontal convexity measuring up to 4 mm. There is no mass effect on the  underlying brain. Thin extra-axial hematoma over the high left frontal convexity measuring 4 mm in maximal thickness. There is no mass effect on the underlying brain. There is new intermediate to low-density expansion of the subdural space around the majority of the left cerebral convexity, consistent with hygroma mixing/subacute  hematoma. Subtle sulcal density and left parietal region, suspicious for trace subarachnoid hemorrhage. No evidence of parenchymal hemorrhage or acute infarct. There is extensive chronic small vessel disease with ischemic gliosis throughout the bilateral periventricular white matter. There is a remote right caudate lacunar infarct and a newly developed but established small vessel infarct around the left lateral ventricle. No hydrocephalus  CT CERVICAL SPINE FINDINGS  T1 compression fracture with mild height loss, new from 2012 but appears chronic. No subluxation or cervical spine fracture. No gross cervical canal hematoma or prevertebral edema. For age, no notable degenerative change.  Critical Value/emergent results were called by telephone at the time of interpretation on 09/29/2014 at 6:55 pm to Dr. Carmin Muskrat , who verbally acknowledged these results.  IMPRESSION: 1. Mixed density/age subdural hematoma around the left cerebral convexity, up to 4 mm in thickness. 2. 4 mm right frontal subdural hematoma. 3. Suspect trace subarachnoid hemorrhage in the left parietal region. 4. Mild T1 compression fracture is new from 2012 but chronic in appearance. 5. Chronic small vessel disease with progression from 2014.   Electronically Signed   By: Monte Fantasia M.D.   On: 09/29/2014 18:59   Dg Shoulder Left  09/29/2014   CLINICAL DATA:  Multiple falls since last Friday. Left shoulder pain. Initial encounter.  EXAM: LEFT SHOULDER - 2+ VIEW  COMPARISON:  None.  FINDINGS: Limited axillary positioning. There is no evidence of fracture or dislocation. There is no evidence of significant  arthropathy or other focal bone abnormality.  IMPRESSION: Negative.   Electronically Signed   By: Monte Fantasia M.D.   On: 09/29/2014 19:30   Dg Hip Unilat With Pelvis 2-3 Views Left  09/29/2014   CLINICAL DATA:  [place] last Friday.  Left hip pain.  EXAM: LEFT HIP (WITH PELVIS) 2-3 VIEWS  COMPARISON:  None.  FINDINGS: Both hips are normally located. No acute fracture is identified. No plain film evidence of avascular necrosis. Mild degenerative changes for age. The pubic symphysis and SI joints are intact. No definite pelvic fractures.  IMPRESSION: No acute bony findings.   Electronically Signed   By: Marijo Sanes M.D.   On: 09/29/2014 19:30   Assessment/Plan Present on Admission:  . Subdural hematoma, post-traumatic . CAD- tandem Cyper DES to prox. and Mid RCA 2006, stable at cath 2008, low risk Myoview March 2014 . ILD (interstitial lung disease) . Failure to thrive (0-17) . HYPERCHOLESTEROLEMIA . HYPERTENSION, BENIGN ESSENTIAL . Dementia . DNR (do not resuscitate) . Subdural hematoma AKI  PLAN:  I spoke with her daughter, her female partner, and her grand-daughter who are at her bedside, that she has bilateral subdural hematoma, and subarachnoid bleed, and the neurosurgeon's recommendation, and the fact that we have no neurosurgical [facility] at [facility], along with her poor surgical candidate and hx of dementia, that we plan to admit her here for OBS, and repeat head CT tomorrow.  They are in agreement with this plan.  The also indicate that her code status is a DNR, and we will honor this.  Her bradycardia, I believe, is due to medication, rather than any cushing reflex of increased ICP, and her Lopressor will be held.  Her BP is getting better with slight IVF, and will hold her Lorsartan.  This will help with her AKI (Cr 1.5, baseline normal) felt to be volume depletion, aggravated by ARB.   We will repeat head CT tomorrow, and if the bleeding increase, then the plan would be to transfer her to  [facility] for  neurosurgical evaluation.  Will check an INR tonight, and avoid sedative medication.   She is made NPO for now.   She is stable and will be admitted to the SDU.  Thank you for asking me to participate in her care.   Other plans as per orders.  Code Status: DNR.    Orvan Falconer, MD. [facility] Pager [phone] 7pm to 7am.  09/29/2014, 9:19 PM

## 2014-09-30 ENCOUNTER — Observation Stay (HOSPITAL_COMMUNITY): Payer: Commercial Managed Care - HMO

## 2014-09-30 ENCOUNTER — Encounter (HOSPITAL_COMMUNITY): Payer: Self-pay | Admitting: *Deleted

## 2014-09-30 ENCOUNTER — Telehealth: Payer: Self-pay | Admitting: *Deleted

## 2014-09-30 ENCOUNTER — Inpatient Hospital Stay (HOSPITAL_COMMUNITY): Payer: Commercial Managed Care - HMO

## 2014-09-30 DIAGNOSIS — Z801 Family history of malignant neoplasm of trachea, bronchus and lung: Secondary | ICD-10-CM | POA: Diagnosis not present

## 2014-09-30 DIAGNOSIS — J449 Chronic obstructive pulmonary disease, unspecified: Secondary | ICD-10-CM | POA: Diagnosis present

## 2014-09-30 DIAGNOSIS — Z8673 Personal history of transient ischemic attack (TIA), and cerebral infarction without residual deficits: Secondary | ICD-10-CM | POA: Diagnosis not present

## 2014-09-30 DIAGNOSIS — I10 Essential (primary) hypertension: Secondary | ICD-10-CM | POA: Diagnosis not present

## 2014-09-30 DIAGNOSIS — I6200 Nontraumatic subdural hemorrhage, unspecified: Secondary | ICD-10-CM | POA: Diagnosis not present

## 2014-09-30 DIAGNOSIS — M1990 Unspecified osteoarthritis, unspecified site: Secondary | ICD-10-CM | POA: Diagnosis present

## 2014-09-30 DIAGNOSIS — K219 Gastro-esophageal reflux disease without esophagitis: Secondary | ICD-10-CM | POA: Diagnosis present

## 2014-09-30 DIAGNOSIS — R627 Adult failure to thrive: Secondary | ICD-10-CM | POA: Diagnosis present

## 2014-09-30 DIAGNOSIS — Z955 Presence of coronary angioplasty implant and graft: Secondary | ICD-10-CM | POA: Diagnosis not present

## 2014-09-30 DIAGNOSIS — J849 Interstitial pulmonary disease, unspecified: Secondary | ICD-10-CM | POA: Diagnosis present

## 2014-09-30 DIAGNOSIS — F0390 Unspecified dementia without behavioral disturbance: Secondary | ICD-10-CM | POA: Diagnosis present

## 2014-09-30 DIAGNOSIS — I2510 Atherosclerotic heart disease of native coronary artery without angina pectoris: Secondary | ICD-10-CM | POA: Diagnosis present

## 2014-09-30 DIAGNOSIS — T502X5A Adverse effect of carbonic-anhydrase inhibitors, benzothiadiazides and other diuretics, initial encounter: Secondary | ICD-10-CM | POA: Diagnosis present

## 2014-09-30 DIAGNOSIS — Z66 Do not resuscitate: Secondary | ICD-10-CM | POA: Diagnosis present

## 2014-09-30 DIAGNOSIS — I739 Peripheral vascular disease, unspecified: Secondary | ICD-10-CM | POA: Diagnosis present

## 2014-09-30 DIAGNOSIS — N179 Acute kidney failure, unspecified: Secondary | ICD-10-CM | POA: Diagnosis not present

## 2014-09-30 DIAGNOSIS — E780 Pure hypercholesterolemia: Secondary | ICD-10-CM | POA: Diagnosis present

## 2014-09-30 DIAGNOSIS — Z9181 History of falling: Secondary | ICD-10-CM | POA: Diagnosis not present

## 2014-09-30 DIAGNOSIS — I5032 Chronic diastolic (congestive) heart failure: Secondary | ICD-10-CM | POA: Diagnosis present

## 2014-09-30 DIAGNOSIS — W19XXXA Unspecified fall, initial encounter: Secondary | ICD-10-CM | POA: Diagnosis present

## 2014-09-30 DIAGNOSIS — Z8249 Family history of ischemic heart disease and other diseases of the circulatory system: Secondary | ICD-10-CM | POA: Diagnosis not present

## 2014-09-30 DIAGNOSIS — F17210 Nicotine dependence, cigarettes, uncomplicated: Secondary | ICD-10-CM | POA: Diagnosis present

## 2014-09-30 DIAGNOSIS — Z823 Family history of stroke: Secondary | ICD-10-CM | POA: Diagnosis not present

## 2014-09-30 DIAGNOSIS — S065X0A Traumatic subdural hemorrhage without loss of consciousness, initial encounter: Secondary | ICD-10-CM | POA: Diagnosis present

## 2014-09-30 DIAGNOSIS — Z800 Family history of malignant neoplasm of digestive organs: Secondary | ICD-10-CM | POA: Diagnosis not present

## 2014-09-30 DIAGNOSIS — G4733 Obstructive sleep apnea (adult) (pediatric): Secondary | ICD-10-CM | POA: Diagnosis present

## 2014-09-30 DIAGNOSIS — R410 Disorientation, unspecified: Secondary | ICD-10-CM | POA: Diagnosis present

## 2014-09-30 LAB — BASIC METABOLIC PANEL
Anion gap: 10 (ref 5–15)
BUN: 22 mg/dL (ref 6–23)
CHLORIDE: 103 mmol/L (ref 96–112)
CO2: 28 mmol/L (ref 19–32)
Calcium: 9.1 mg/dL (ref 8.4–10.5)
Creatinine, Ser: 1 mg/dL (ref 0.50–1.10)
GFR calc Af Amer: 61 mL/min — ABNORMAL LOW (ref >90)
GFR, EST NON AFRICAN AMERICAN: 53 mL/min — AB (ref >90)
Glucose, Bld: 153 mg/dL — ABNORMAL HIGH (ref 70–99)
POTASSIUM: 2.8 mmol/L — AB (ref 3.5–5.1)
SODIUM: 141 mmol/L (ref 135–145)

## 2014-09-30 LAB — URINALYSIS, ROUTINE W REFLEX MICROSCOPIC
BILIRUBIN URINE: NEGATIVE
Glucose, UA: NEGATIVE mg/dL
Hgb urine dipstick: NEGATIVE
KETONES UR: NEGATIVE mg/dL
Leukocytes, UA: NEGATIVE
Nitrite: NEGATIVE
PH: 5.5 (ref 5.0–8.0)
PROTEIN: NEGATIVE mg/dL
Specific Gravity, Urine: 1.025 (ref 1.005–1.030)
Urobilinogen, UA: 0.2 mg/dL (ref 0.0–1.0)

## 2014-09-30 LAB — MRSA PCR SCREENING: MRSA by PCR: NEGATIVE

## 2014-09-30 LAB — TSH: TSH: 1.373 u[IU]/mL (ref 0.350–4.500)

## 2014-09-30 MED ORDER — ATORVASTATIN CALCIUM 40 MG PO TABS
80.0000 mg | ORAL_TABLET | Freq: Every day | ORAL | Status: DC
  Administered 2014-09-30: 80 mg via ORAL
  Filled 2014-09-30: qty 2

## 2014-09-30 MED ORDER — PANTOPRAZOLE SODIUM 40 MG PO TBEC
40.0000 mg | DELAYED_RELEASE_TABLET | Freq: Two times a day (BID) | ORAL | Status: DC
  Administered 2014-09-30 – 2014-10-01 (×4): 40 mg via ORAL
  Filled 2014-09-30 (×4): qty 1

## 2014-09-30 MED ORDER — OXYCODONE-ACETAMINOPHEN 5-325 MG PO TABS
1.0000 | ORAL_TABLET | Freq: Three times a day (TID) | ORAL | Status: DC | PRN
  Administered 2014-09-30 – 2014-10-01 (×3): 1 via ORAL
  Filled 2014-09-30 (×3): qty 1

## 2014-09-30 MED ORDER — MOMETASONE FURO-FORMOTEROL FUM 100-5 MCG/ACT IN AERO
INHALATION_SPRAY | RESPIRATORY_TRACT | Status: AC
  Filled 2014-09-30: qty 8.8

## 2014-09-30 MED ORDER — ONDANSETRON HCL 4 MG/2ML IJ SOLN
4.0000 mg | Freq: Four times a day (QID) | INTRAMUSCULAR | Status: DC | PRN
  Administered 2014-10-01 (×2): 4 mg via INTRAVENOUS
  Filled 2014-09-30 (×2): qty 2

## 2014-09-30 MED ORDER — FLUOXETINE HCL 20 MG PO CAPS
20.0000 mg | ORAL_CAPSULE | Freq: Every day | ORAL | Status: DC
  Administered 2014-09-30 – 2014-10-01 (×2): 20 mg via ORAL
  Filled 2014-09-30 (×4): qty 1

## 2014-09-30 MED ORDER — SODIUM CHLORIDE 0.9 % IJ SOLN
3.0000 mL | Freq: Two times a day (BID) | INTRAMUSCULAR | Status: DC
  Administered 2014-09-30 (×2): 3 mL via INTRAVENOUS

## 2014-09-30 MED ORDER — ALBUTEROL SULFATE (2.5 MG/3ML) 0.083% IN NEBU: 3.0000 mL | INHALATION_SOLUTION | RESPIRATORY_TRACT | Status: DC | PRN

## 2014-09-30 MED ORDER — POTASSIUM CHLORIDE IN NACL 20-0.9 MEQ/L-% IV SOLN
INTRAVENOUS | Status: DC
  Administered 2014-09-30 (×2): via INTRAVENOUS

## 2014-09-30 MED ORDER — TIOTROPIUM BROMIDE MONOHYDRATE 18 MCG IN CAPS
ORAL_CAPSULE | RESPIRATORY_TRACT | Status: AC
  Filled 2014-09-30: qty 5

## 2014-09-30 MED ORDER — TIOTROPIUM BROMIDE MONOHYDRATE 18 MCG IN CAPS
18.0000 ug | ORAL_CAPSULE | Freq: Every day | RESPIRATORY_TRACT | Status: DC
  Administered 2014-09-30: 18 ug via RESPIRATORY_TRACT
  Filled 2014-09-30: qty 5

## 2014-09-30 MED ORDER — ONDANSETRON HCL 4 MG PO TABS
4.0000 mg | ORAL_TABLET | Freq: Four times a day (QID) | ORAL | Status: DC | PRN
  Administered 2014-09-30: 4 mg via ORAL
  Filled 2014-09-30: qty 1

## 2014-09-30 MED ORDER — MOMETASONE FURO-FORMOTEROL FUM 100-5 MCG/ACT IN AERO
2.0000 | INHALATION_SPRAY | Freq: Two times a day (BID) | RESPIRATORY_TRACT | Status: DC
  Administered 2014-09-30 (×2): 2 via RESPIRATORY_TRACT
  Filled 2014-09-30: qty 8.8

## 2014-09-30 MED ORDER — FLUTICASONE PROPIONATE 50 MCG/ACT NA SUSP
1.0000 | Freq: Every day | NASAL | Status: DC
  Administered 2014-09-30: 1 via NASAL
  Filled 2014-09-30: qty 16

## 2014-09-30 MED ORDER — POTASSIUM CHLORIDE CRYS ER 20 MEQ PO TBCR
40.0000 meq | EXTENDED_RELEASE_TABLET | Freq: Once | ORAL | Status: AC
Start: 2014-09-30 — End: 2014-09-30
  Administered 2014-09-30: 40 meq via ORAL
  Filled 2014-09-30: qty 2

## 2014-09-30 MED ORDER — ACETAMINOPHEN 325 MG PO TABS: 650.0000 mg | ORAL_TABLET | Freq: Four times a day (QID) | ORAL | Status: DC | PRN

## 2014-09-30 MED ORDER — ISOSORBIDE MONONITRATE ER 60 MG PO TB24
60.0000 mg | ORAL_TABLET | Freq: Every day | ORAL | Status: DC
  Administered 2014-09-30 – 2014-10-01 (×2): 60 mg via ORAL
  Filled 2014-09-30 (×2): qty 1

## 2014-09-30 MED ORDER — AMLODIPINE BESYLATE 5 MG PO TABS
5.0000 mg | ORAL_TABLET | Freq: Every day | ORAL | Status: DC
  Administered 2014-09-30 – 2014-10-01 (×2): 5 mg via ORAL
  Filled 2014-09-30 (×2): qty 1

## 2014-09-30 MED ORDER — POTASSIUM CHLORIDE CRYS ER 20 MEQ PO TBCR
40.0000 meq | EXTENDED_RELEASE_TABLET | Freq: Once | ORAL | Status: AC
  Administered 2014-09-30: 40 meq via ORAL
  Filled 2014-09-30: qty 2

## 2014-09-30 NOTE — Progress Notes (Signed)
Patient's hear rate ranging from 42-50 and is asymptomatic at this time informed Dr. Susanne Borders of status.

## 2014-09-30 NOTE — Evaluation (Signed)
Physical Therapy Evaluation Patient Details Name: Kathy Marshall MRN: [id number] DOB: 1934-12-28 Today's Date: 09/30/2014   History of Present Illness  Kathy Marshall is an 80 y.o. female with hx dementia, COPD, HTN, hx of RAS, CHF, anxiety, brought to the ER 36 hours after a fall. She did not recall the event. Family stated she has been more confused than her usual baseline dementia. Head CT in the ER showed bilateral subdural hematomas, and trace subarachnoid intracranial hemorrhage. Neurosurgery consultation was done by ED, and recommended no surgical intervention, to obtain a follow up CT in 24 hours, hospitalist was asked to admit her for observation, and to follow up head CT tomorrow. When I saw her, she was alert, conversing, with bradycardia HR 40's to 50's, and BP 106. She did not recall any fall whatsover.   Clinical Impression   Pt is scheduled to have another CT scan later today to ensure that there has been no further cranial bleed.  Currently, pt is seen for evaluation.  She is alert and cooperative, minimal confusion.  She has no pain other than chronic back pain.  Pt is found to have generalized weakness (equal from right to left) and coordination is WNL.  Her standing balance is only fair with no assistive device.  She normally uses no assistive device for gait and I am now recommending that a walker be used.  I would also recommend HHPT for which pt is agreeable.    Follow Up Recommendations Home health PT    Equipment Recommendations  None recommended by PT    Recommendations for Other Services   none    Precautions / Restrictions Precautions Precautions: Fall Restrictions Weight Bearing Restrictions: No      Mobility  Bed Mobility Overal bed mobility: Modified Independent                Transfers Overall transfer level: Needs assistance Equipment used: None Transfers: Sit to/from Stand Sit to Stand: Supervision         General  transfer comment: pt needs supervision due to decreased standing balance  Ambulation/Gait Ambulation/Gait assistance:  (not tested due to numerous IV lines...)              Stairs            Wheelchair Mobility    Modified Rankin (Stroke Patients Only)       Balance Overall balance assessment: Needs assistance Sitting-balance support: No upper extremity supported;Feet supported Sitting balance-Leahy Scale: Good     Standing balance support: No upper extremity supported Standing balance-Leahy Scale: Fair Standing balance comment: pt unable to accept any challenge to standing and maintain balance                             Pertinent Vitals/Pain Pain Assessment: 0-10 Pain Score: 8  Pain Location: low back Pain Descriptors / Indicators: Aching Pain Intervention(s): Limited activity within patient's tolerance;Patient requesting pain meds-RN notified;RN gave pain meds during session    Home Living Family/patient expects to be discharged to:: Private residence Living Arrangements: Spouse/significant other;Children Available Help at Discharge: Family;Available 24 hours/day Type of Home: House Home Access: Stairs to enter Entrance Stairs-Rails: Right Entrance Stairs-Number of Steps: 4 Home Layout: Multi-level Home Equipment: Walker - 2 wheels;Cane - single point      Prior Function Level of Independence: Independent         Comments: significant other manages all household tasks, pt  does her own bathing and grooming     Hand Dominance   Dominant Hand: Left    Extremity/Trunk Assessment   Upper Extremity Assessment: Generalized weakness (limited left shoulder ROM)           Lower Extremity Assessment: Generalized weakness         Communication   Communication: No difficulties  Cognition Arousal/Alertness: Awake/alert Behavior During Therapy: WFL for tasks assessed/performed Overall Cognitive Status: History of cognitive  impairments - at baseline                      General Comments      Exercises        Assessment/Plan    PT Assessment Patient needs continued PT services  PT Diagnosis Generalized weakness (decreased balance)   PT Problem List Decreased strength;Decreased activity tolerance;Decreased balance;Decreased mobility;Decreased knowledge of use of DME;Decreased knowledge of precautions;Pain (pain is chronic)  PT Treatment Interventions Gait training;Functional mobility training;Therapeutic exercise   PT Goals (Current goals can be found in the Care Plan section) Acute Rehab PT Goals Patient Stated Goal: none stated PT Goal Formulation: With patient Time For Goal Achievement: 10/14/14 Potential to Achieve Goals: Good    Frequency Min 3X/week   Barriers to discharge   Multiple sets of steps    Co-evaluation               End of Session Equipment Utilized During Treatment: Gait belt Activity Tolerance: Patient tolerated treatment well Patient left: in bed;with call bell/phone within reach Nurse Communication: Mobility status    Functional Assessment Tool Used: clinical judgement Functional Limitation: Mobility: Walking and moving around Mobility: Walking and Moving Around Current Status (W3888): At least 20 percent but less than 40 percent impaired, limited or restricted Mobility: Walking and Moving Around Goal Status [phone]): At least 1 percent but less than 20 percent impaired, limited or restricted    Time: 4917-9150 PT Time Calculation (min) (ACUTE ONLY): 42 min   Charges:   PT Evaluation $Initial PT Evaluation Tier I: 1 Procedure     PT G Codes:   PT G-Codes **NOT FOR INPATIENT CLASS** Functional Assessment Tool Used: clinical judgement Functional Limitation: Mobility: Walking and moving around Mobility: Walking and Moving Around Current Status (V6979): At least 20 percent but less than 40 percent impaired, limited or restricted Mobility: Walking and  Moving Around Goal Status [phone]): At least 1 percent but less than 20 percent impaired, limited or restricted    Sable Feil 09/30/2014, 1:50 PM

## 2014-09-30 NOTE — Progress Notes (Signed)
UR completed 

## 2014-09-30 NOTE — Telephone Encounter (Signed)
Received fax from [facility] care mgmt Dept stating that pt listed has been admitted to acute care hospital  Hospital: [facility] date: 09/29/14  Diagnosis: I62.00-nontraumatic subdural hemorrhage,unspecified  Admitting physician: Le,Peter,MD  Pending authorization: [id number]

## 2014-09-30 NOTE — Progress Notes (Signed)
[facility] PROGRESS NOTE  RAYNA BRENNER ZSW:[id number] DOB: 1934/10/24 DOA: 09/29/2014 PCP: Odette Fraction, MD  Assessment/Plan: 80 y/o female with PMH of HTN, COPD, CHF, Chronic pains, h/o GIB, Dementia brought to the ER 36 hours after a fall. Head CT in the ER showed bilateral subdural hematomas, and trace subarachnoid intracranial hemorrhage.Neurosurgery consultation was done by ED, and recommended no surgical intervention, to obtain a follow up CT in 24 hours -admitted with [place]; AKI, Bradycardia, fall   1. Fall-->SDH, trace SAH; asymptomatic; suspected recurrent falls at home;  -will repeat CT head to f/u-pend; PT/OT eval; avoid NSAID  2. AKI likely due to diuretic use+losartan; wil provide gentle IVF hydration; replace lytes; recheck BMP; hold lasix   3. H/o CHF-diastolic HF; clinically dehydrated; will hold diuretics; resume as needed; (per family lasix is used as needed at home) 4. COPD, no wheezing gon exam; cont bronchodilators; stop smoking 5. HTN; labile BP, currently elevated; resume NTG, added amlodipine; hold losartan; hold BB due to bradycardia; prn hydralazine     Code Status: DNR Family Communication: d/w patient, her daughter  (indicate person spoken with, relationship, and if by phone, the number) Disposition Plan: pend PT   Consultants:  none  Procedures:  none  Antibiotics:  None  (indicate start date, and stop date if known)  HPI/Subjective: Alert, confused at baseline   Objective: Filed Vitals:   09/30/14 0500  BP: 130/76  Pulse: 54  Temp:   Resp: 17    Intake/Output Summary (Last 24 hours) at 09/30/14 0834 Last data filed at 09/30/14 0630  Gross per 24 hour  Intake 960.41 ml  Output   1125 ml  Net -164.59 ml   Filed Weights   09/29/14 1728 09/30/14 0100 09/30/14 0500  Weight: 38.556 kg (85 lb) 37.6 kg (82 lb 14.3 oz) 37.2 kg (82 lb 0.2 oz)    Exam:   General:  No distress   Cardiovascular: s1,s2  rrr  Respiratory: no wheezing  Abdomen: soft, nt,nd   Musculoskeletal: no leg edema   Data Reviewed: Basic Metabolic Panel:  Recent Labs Lab 09/29/14 1904  NA 140  K 3.1*  CL 100  CO2 28  GLUCOSE 121*  BUN 32*  CREATININE 1.59*  CALCIUM 9.1   Liver Function Tests:  Recent Labs Lab 09/29/14 1904  AST 23  ALT 22  ALKPHOS 74  BILITOT 0.5  PROT 7.1  ALBUMIN 3.8   No results for input(s): LIPASE, AMYLASE in the last 168 hours. No results for input(s): AMMONIA in the last 168 hours. CBC:  Recent Labs Lab 09/29/14 1904  WBC 7.9  NEUTROABS 5.2  HGB 13.1  HCT 39.8  MCV 94.5  PLT 157   Cardiac Enzymes: No results for input(s): CKTOTAL, CKMB, CKMBINDEX, TROPONINI in the last 168 hours. BNP (last 3 results) No results for input(s): BNP in the last 8760 hours.  ProBNP (last 3 results) No results for input(s): PROBNP in the last 8760 hours.  CBG: No results for input(s): GLUCAP in the last 168 hours.  Recent Results (from the past 240 hour(s))  MRSA PCR Screening     Status: None   Collection Time: 09/30/14 12:57 AM  Result Value Ref Range Status   MRSA by PCR NEGATIVE NEGATIVE Final    Comment:        The GeneXpert MRSA Assay (FDA approved for NASAL specimens only), is one component of a comprehensive MRSA colonization surveillance program. It is not intended to diagnose MRSA infection nor  to guide or monitor treatment for MRSA infections.      Studies: Ct Head Wo Contrast  09/29/2014   CLINICAL DATA:  Falling since last Friday with remote bruising around the left eye. Initial encounter.  EXAM: CT HEAD WITHOUT CONTRAST  CT CERVICAL SPINE WITHOUT CONTRAST  TECHNIQUE: Multidetector CT imaging of the head and cervical spine was performed following the standard protocol without intravenous contrast. Multiplanar CT image reconstructions of the cervical spine were also generated.  COMPARISON:  Head CT 11/21/2012.  Cervical spine CT 08/10/2010  FINDINGS: CT  HEAD FINDINGS  Skull and Sinuses:Negative for fracture or destructive process. The mastoids, middle ears, and imaged paranasal sinuses are clear.  Orbits: No acute abnormality.  Brain: Thin acute subdural hematoma around the right frontal convexity measuring up to 4 mm. There is no mass effect on the underlying brain. Thin extra-axial hematoma over the high left frontal convexity measuring 4 mm in maximal thickness. There is no mass effect on the underlying brain. There is new intermediate to low-density expansion of the subdural space around the majority of the left cerebral convexity, consistent with hygroma mixing/subacute hematoma. Subtle sulcal density and left parietal region, suspicious for trace subarachnoid hemorrhage. No evidence of parenchymal hemorrhage or acute infarct. There is extensive chronic small vessel disease with ischemic gliosis throughout the bilateral periventricular white matter. There is a remote right caudate lacunar infarct and a newly developed but established small vessel infarct around the left lateral ventricle. No hydrocephalus  CT CERVICAL SPINE FINDINGS  T1 compression fracture with mild height loss, new from 2012 but appears chronic. No subluxation or cervical spine fracture. No gross cervical canal hematoma or prevertebral edema. For age, no notable degenerative change.  Critical Value/emergent results were called by telephone at the time of interpretation on 09/29/2014 at 6:55 pm to Dr. Carmin Muskrat , who verbally acknowledged these results.  IMPRESSION: 1. Mixed density/age subdural hematoma around the left cerebral convexity, up to 4 mm in thickness. 2. 4 mm right frontal subdural hematoma. 3. Suspect trace subarachnoid hemorrhage in the left parietal region. 4. Mild T1 compression fracture is new from 2012 but chronic in appearance. 5. Chronic small vessel disease with progression from 2014.   Electronically Signed   By: Monte Fantasia M.D.   On: 09/29/2014 18:59   Ct  Cervical Spine Wo Contrast  09/29/2014   CLINICAL DATA:  Falling since last Friday with remote bruising around the left eye. Initial encounter.  EXAM: CT HEAD WITHOUT CONTRAST  CT CERVICAL SPINE WITHOUT CONTRAST  TECHNIQUE: Multidetector CT imaging of the head and cervical spine was performed following the standard protocol without intravenous contrast. Multiplanar CT image reconstructions of the cervical spine were also generated.  COMPARISON:  Head CT 11/21/2012.  Cervical spine CT 08/10/2010  FINDINGS: CT HEAD FINDINGS  Skull and Sinuses:Negative for fracture or destructive process. The mastoids, middle ears, and imaged paranasal sinuses are clear.  Orbits: No acute abnormality.  Brain: Thin acute subdural hematoma around the right frontal convexity measuring up to 4 mm. There is no mass effect on the underlying brain. Thin extra-axial hematoma over the high left frontal convexity measuring 4 mm in maximal thickness. There is no mass effect on the underlying brain. There is new intermediate to low-density expansion of the subdural space around the majority of the left cerebral convexity, consistent with hygroma mixing/subacute hematoma. Subtle sulcal density and left parietal region, suspicious for trace subarachnoid hemorrhage. No evidence of parenchymal hemorrhage or acute infarct. There  is extensive chronic small vessel disease with ischemic gliosis throughout the bilateral periventricular white matter. There is a remote right caudate lacunar infarct and a newly developed but established small vessel infarct around the left lateral ventricle. No hydrocephalus  CT CERVICAL SPINE FINDINGS  T1 compression fracture with mild height loss, new from 2012 but appears chronic. No subluxation or cervical spine fracture. No gross cervical canal hematoma or prevertebral edema. For age, no notable degenerative change.  Critical Value/emergent results were called by telephone at the time of interpretation on 09/29/2014 at  6:55 pm to Dr. Carmin Muskrat , who verbally acknowledged these results.  IMPRESSION: 1. Mixed density/age subdural hematoma around the left cerebral convexity, up to 4 mm in thickness. 2. 4 mm right frontal subdural hematoma. 3. Suspect trace subarachnoid hemorrhage in the left parietal region. 4. Mild T1 compression fracture is new from 2012 but chronic in appearance. 5. Chronic small vessel disease with progression from 2014.   Electronically Signed   By: Monte Fantasia M.D.   On: 09/29/2014 18:59   Dg Shoulder Left  09/29/2014   CLINICAL DATA:  Multiple falls since last Friday. Left shoulder pain. Initial encounter.  EXAM: LEFT SHOULDER - 2+ VIEW  COMPARISON:  None.  FINDINGS: Limited axillary positioning. There is no evidence of fracture or dislocation. There is no evidence of significant arthropathy or other focal bone abnormality.  IMPRESSION: Negative.   Electronically Signed   By: Monte Fantasia M.D.   On: 09/29/2014 19:30   Dg Hip Unilat With Pelvis 2-3 Views Left  09/29/2014   CLINICAL DATA:  [place] last Friday.  Left hip pain.  EXAM: LEFT HIP (WITH PELVIS) 2-3 VIEWS  COMPARISON:  None.  FINDINGS: Both hips are normally located. No acute fracture is identified. No plain film evidence of avascular necrosis. Mild degenerative changes for age. The pubic symphysis and SI joints are intact. No definite pelvic fractures.  IMPRESSION: No acute bony findings.   Electronically Signed   By: Marijo Sanes M.D.   On: 09/29/2014 19:30    Scheduled Meds: . atorvastatin  80 mg Oral q1800  . FLUoxetine  20 mg Oral Daily  . fluticasone  1 spray Each Nare Daily  . isosorbide mononitrate  60 mg Oral Daily  . mometasone-formoterol  2 puff Inhalation BID  . pantoprazole  40 mg Oral BID  . sodium chloride  3 mL Intravenous Q12H  . tiotropium  18 mcg Inhalation Daily   Continuous Infusions: . sodium chloride 10 mL/hr at 09/30/14 0450    Active Problems:   HYPERCHOLESTEROLEMIA   HYPERTENSION, BENIGN  ESSENTIAL   ILD (interstitial lung disease)   CAD- tandem Cyper DES to prox. and Mid RCA 2006, stable at cath 2008, low risk Myoview March 2014   Failure to thrive (0-17)   Subdural hematoma, post-traumatic   Dementia   DNR (do not resuscitate)   Subdural hematoma   AKI (acute kidney injury)    Time spent: >35 minutes     Kinnie Feil  [facility] Pager [phone]. If 7PM-7AM, please contact night-coverage at [email or web address], password [facility] 09/30/2014, 8:34 AM

## 2014-09-30 NOTE — Care Management Note (Addendum)
    Page 1 of 1   10/01/2014     2:28:02 PM CARE MANAGEMENT NOTE 10/01/2014  Patient:  Kathy Marshall, Kathy Marshall   Account Number:  [id number]  Date Initiated:  09/30/2014  Documentation initiated by:  CHILDRESS,JESSICA  Subjective/Objective Assessment:   Pt is from home, lives with spouse. Pt is independent at baseline. Pt has a cane and walker but no wheelchair. Pt has no other DME's or [place] services prior to admission. Pt has had HH PT in the past through [place].     Action/Plan:   Pt plans to discharge home, anticipated need for [facility] PT/RN. Pt would like [place]. Will cont to follow. PT eval pending.   Anticipated DC Date:  10/01/2014   Anticipated DC Plan:  [place]  CM consult      [facility] Choice  HOME HEALTH   Choice offered to / List presented to:  C-1 Patient        [place] arranged  HH-2 PT      [place]   Status of service:  Completed, signed off Medicare Important Message given?   (If response is "NO", the following Medicare IM given date fields will be blank) Date Medicare IM given:   Medicare IM given by:   Date Additional Medicare IM given:   Additional Medicare IM given by:    Discharge Disposition:  [place]  Per UR Regulation:  Reviewed for med. necessity/level of care/duration of stay  If discussed at [place] of Stay Meetings, dates discussed:    Comments:  10/01/2014 [place], RN, MSN, CM Pt discharging home today with [facility] PT. Pt has choosen [place] and [place], has been notified and will obtain pt information from chart. No further CM needs.  09/30/2014 [place], RN, MSN, CM

## 2014-10-01 LAB — AFB CULTURE WITH SMEAR (NOT AT ARMC)
ACID FAST SMEAR: NONE SEEN
ACID FAST SMEAR: NONE SEEN
SPECIAL REQUESTS: NORMAL

## 2014-10-01 LAB — BASIC METABOLIC PANEL
Anion gap: 10 (ref 5–15)
BUN: 12 mg/dL (ref 6–23)
CO2: 25 mmol/L (ref 19–32)
CREATININE: 0.62 mg/dL (ref 0.50–1.10)
Calcium: 9.6 mg/dL (ref 8.4–10.5)
Chloride: 108 mmol/L (ref 96–112)
GFR, EST NON AFRICAN AMERICAN: 85 mL/min — AB (ref >90)
Glucose, Bld: 139 mg/dL — ABNORMAL HIGH (ref 70–99)
Potassium: 3.5 mmol/L (ref 3.5–5.1)
Sodium: 143 mmol/L (ref 135–145)

## 2014-10-01 MED ORDER — PROMETHAZINE HCL 25 MG/ML IJ SOLN
6.2500 mg | Freq: Four times a day (QID) | INTRAMUSCULAR | Status: DC | PRN
  Administered 2014-10-01: 6.25 mg via INTRAVENOUS
  Filled 2014-10-01: qty 1

## 2014-10-01 NOTE — Progress Notes (Signed)
OT Cancellation Note  Patient Details Name: THIRZA PELLICANO MRN: [id number] DOB: [birth year]   Cancelled Treatment:     Reason evaluation not completed: Per nursing, pt nauseous and vomiting this morning, not able/willing to complete evaluation. Will try again at a later time.   Guadelupe Sabin, OTR/L  [phone]  10/01/2014, 9:11 AM

## 2014-10-01 NOTE — Discharge Summary (Signed)
Physician Discharge Summary  Kathy Marshall GYB:[id number] DOB: 11-Sep-1934 DOA: 09/29/2014  PCP: Odette Fraction, MD  Admit date: 09/29/2014 Discharge date: 10/01/2014  Time spent: 45 minutes  Recommendations for Outpatient Follow-up:  -Will be discharged home today. -[place] services will be arranged. -Will follow up with PCP in 2 weeks.   Discharge Diagnoses:  Active Problems:   HYPERCHOLESTEROLEMIA   HYPERTENSION, BENIGN ESSENTIAL   ILD (interstitial lung disease)   CAD- tandem Cyper DES to prox. and Mid RCA 2006, stable at cath 2008, low risk Myoview March 2014   Failure to thrive (0-17)   Subdural hematoma, post-traumatic   Dementia   DNR (do not resuscitate)   Subdural hematoma   AKI (acute kidney injury)   Discharge Condition: Stable and improved  Filed Weights   09/30/14 0100 09/30/14 0500 10/01/14 0500  Weight: 37.6 kg (82 lb 14.3 oz) 37.2 kg (82 lb 0.2 oz) 36 kg (79 lb 5.9 oz)    History of present illness:  Kathy Marshall is an 81 y.o. female with hx dementia, COPD, HTN, hx of RAS, CHF, anxiety, brought to the ER 36 hours after a fall. She did not recall the event. Family stated she has been more confused than her usual baseline dementia. Head CT in the ER showed bilateral subdural hematomas, and trace subarachnoid intracranial hemorrhage. Neurosurgery consultation was done by ED, and recommended no surgical intervention, to obtain a follow up CT in 24 hours, hospitalist was asked to admit her for observation, and to follow up head CT tomorrow. When I saw her, she was alert, conversing, with bradycardia HR 40's to 50's, and BP 106. She did not recall any fall whatsover.   Hospital Course:   SDH -Per neurosurgery, no intervention required. -Repeat Head CT showed decrease in size of SDH. -Not on anticoagulation.  ARF -Resolved with IVF.  Chronic Diastolic CHF -Compensated.  COPD -Without acute exacerbation.  HTN -Continue home meds. -BP  has been elevated to th 876-811 systolic range, however her home meds had been held on admission. -Follow up with PCP in 2 weeks for BP check.    Procedures:  None   Consultations:  None  Discharge Instructions  Discharge Instructions    Diet - low sodium heart healthy    Complete by:  As directed      Increase activity slowly    Complete by:  As directed             Medication List    STOP taking these medications        furosemide 40 MG tablet  Commonly known as:  LASIX     megestrol 400 MG/10ML suspension  Commonly known as:  MEGACE     mirtazapine 30 MG tablet  Commonly known as:  REMERON      TAKE these medications        acetaminophen 325 MG tablet  Commonly known as:  TYLENOL  Take 650 mg by mouth every 6 (six) hours as needed for headache.     ADVAIR HFA 45-21 MCG/ACT inhaler  Generic drug:  fluticasone-salmeterol  INHALE 2 PUFFS INTO THE LUNGS 2 TIMES DAILY AS NEEDED FOR WHEEZING AND SHORTNESS OF BREATH     albuterol 108 (90 BASE) MCG/ACT inhaler  Commonly known as:  PROAIR HFA  Inhale 2 puffs into the lungs every 4 (four) hours as needed for wheezing.     ALIVE WOMENS 50+ PO  Take 1 tablet by mouth daily.  ALPRAZolam 0.5 MG tablet  Commonly known as:  XANAX  TAKE 1 TABLET TWICE A DAY AS NEEDED *MUST LAST 30 DAYS*     atorvastatin 80 MG tablet  Commonly known as:  LIPITOR  Take 1 tablet (80 mg total) by mouth daily at 6 PM.     COMPLETE PROTEIN/VITAMIN SHAKE PO  Take 1 Bottle by mouth daily as needed (nutritional supplment).     dronabinol 10 MG capsule  Commonly known as:  MARINOL  Take 10 mg by mouth 2 (two) times daily.     FLUoxetine 20 MG tablet  Commonly known as:  PROZAC  Take 1 tablet (20 mg total) by mouth daily.     fluticasone 50 MCG/ACT nasal spray  Commonly known as:  FLONASE  PLACE 2 SPRAYS INTO BOTH NOSTRILS DAILY.     gabapentin 300 MG capsule  Commonly known as:  NEURONTIN  TAKE 1 CAPSULE BY MOUTH DAILY AT  BEDTIME     isosorbide mononitrate 60 MG 24 hr tablet  Commonly known as:  IMDUR  TAKE 1 TABLET (60 MG TOTAL) BY MOUTH EVERY EVENING.     levocetirizine 5 MG tablet  Commonly known as:  XYZAL  Take 1 tablet (5 mg total) by mouth every evening.     losartan 50 MG tablet  Commonly known as:  COZAAR  Take 50 mg by mouth daily.     metoCLOPramide 10 MG tablet  Commonly known as:  REGLAN  Take 1 tablet (10 mg total) by mouth 3 (three) times daily before meals.     metoprolol tartrate 25 MG tablet  Commonly known as:  LOPRESSOR  Take 1 tablet (25 mg total) by mouth 2 (two) times daily.     morphine 30 MG 12 hr tablet  Commonly known as:  MS CONTIN  Take 30 mg by mouth every evening.     ondansetron 8 MG tablet  Commonly known as:  ZOFRAN  Take 1 tablet by mouth 2 (two) times daily as needed for nausea or vomiting.     oxyCODONE-acetaminophen 10-325 MG per tablet  Commonly known as:  PERCOCET  Take 1 tablet by mouth every 6 (six) hours as needed for pain.     pantoprazole 40 MG tablet  Commonly known as:  PROTONIX  Take 40 mg by mouth 2 (two) times daily.     tiotropium 18 MCG inhalation capsule  Commonly known as:  SPIRIVA  Place 1 capsule (18 mcg total) into inhaler and inhale daily.       No Known Allergies     Follow-up Information    Follow up with [facility] T[facility], MD. Schedule an appointment as soon as possible for a visit in 2 weeks.   Specialty:  Family Medicine   Contact information:   [address] [place] [address] [phone]        The results of significant diagnostics from this hospitalization (including imaging, microbiology, ancillary and laboratory) are listed below for reference.    Significant Diagnostic Studies: Ct Head Wo Contrast  09/30/2014   CLINICAL DATA:  Followup subdural hematoma.  Is fell still  EXAM: CT HEAD WITHOUT CONTRAST  TECHNIQUE: Contiguous axial images were obtained from the base of the skull through the  vertex without intravenous contrast.  COMPARISON:  Yesterday.  FINDINGS: The previously demonstrated 4 mm thick anterior left frontal subdural hematoma is not visualized today. A previously demonstrated 3 mm thick left lateral subdural hematoma continues to measure 3  mm in maximum thickness. No new hemorrhage is seen. The previously suspected trace subarachnoid hemorrhage posteriorly on the left is no longer visualized.  Diffusely enlarged ventricles and subarachnoid spaces. Patchy white matter low density in both cerebral hemispheres. An old left frontal white matter lacunar infarct is unchanged. Stable small right caudate lacunar infarct. No new hemorrhage or new areas of infarction are identified. Unremarkable bones and included paranasal sinuses.  IMPRESSION: 1. Interval decrease in size of the previously demonstrated small left subdural hematoma. 2. The previously suspected small amount of left posterior subarachnoid hemorrhage is no longer visualized. 3. No new hemorrhage. 4. Stable atrophy, chronic small vessel white matter ischemic changes and old right caudate and left frontal lobe lacunar infarcts.   Electronically Signed   By: Claudie Revering M.D.   On: 09/30/2014 18:47   Ct Head Wo Contrast  09/29/2014   CLINICAL DATA:  Falling since last Friday with remote bruising around the left eye. Initial encounter.  EXAM: CT HEAD WITHOUT CONTRAST  CT CERVICAL SPINE WITHOUT CONTRAST  TECHNIQUE: Multidetector CT imaging of the head and cervical spine was performed following the standard protocol without intravenous contrast. Multiplanar CT image reconstructions of the cervical spine were also generated.  COMPARISON:  Head CT 11/21/2012.  Cervical spine CT 08/10/2010  FINDINGS: CT HEAD FINDINGS  Skull and Sinuses:Negative for fracture or destructive process. The mastoids, middle ears, and imaged paranasal sinuses are clear.  Orbits: No acute abnormality.  Brain: Thin acute subdural hematoma around the right frontal  convexity measuring up to 4 mm. There is no mass effect on the underlying brain. Thin extra-axial hematoma over the high left frontal convexity measuring 4 mm in maximal thickness. There is no mass effect on the underlying brain. There is new intermediate to low-density expansion of the subdural space around the majority of the left cerebral convexity, consistent with hygroma mixing/subacute hematoma. Subtle sulcal density and left parietal region, suspicious for trace subarachnoid hemorrhage. No evidence of parenchymal hemorrhage or acute infarct. There is extensive chronic small vessel disease with ischemic gliosis throughout the bilateral periventricular white matter. There is a remote right caudate lacunar infarct and a newly developed but established small vessel infarct around the left lateral ventricle. No hydrocephalus  CT CERVICAL SPINE FINDINGS  T1 compression fracture with mild height loss, new from 2012 but appears chronic. No subluxation or cervical spine fracture. No gross cervical canal hematoma or prevertebral edema. For age, no notable degenerative change.  Critical Value/emergent results were called by telephone at the time of interpretation on 09/29/2014 at 6:55 pm to Dr. Carmin Muskrat , who verbally acknowledged these results.  IMPRESSION: 1. Mixed density/age subdural hematoma around the left cerebral convexity, up to 4 mm in thickness. 2. 4 mm right frontal subdural hematoma. 3. Suspect trace subarachnoid hemorrhage in the left parietal region. 4. Mild T1 compression fracture is new from 2012 but chronic in appearance. 5. Chronic small vessel disease with progression from 2014.   Electronically Signed   By: Monte Fantasia M.D.   On: 09/29/2014 18:59   Ct Cervical Spine Wo Contrast  09/29/2014   CLINICAL DATA:  Falling since last Friday with remote bruising around the left eye. Initial encounter.  EXAM: CT HEAD WITHOUT CONTRAST  CT CERVICAL SPINE WITHOUT CONTRAST  TECHNIQUE: Multidetector CT  imaging of the head and cervical spine was performed following the standard protocol without intravenous contrast. Multiplanar CT image reconstructions of the cervical spine were also generated.  COMPARISON:  Head CT  11/21/2012.  Cervical spine CT 08/10/2010  FINDINGS: CT HEAD FINDINGS  Skull and Sinuses:Negative for fracture or destructive process. The mastoids, middle ears, and imaged paranasal sinuses are clear.  Orbits: No acute abnormality.  Brain: Thin acute subdural hematoma around the right frontal convexity measuring up to 4 mm. There is no mass effect on the underlying brain. Thin extra-axial hematoma over the high left frontal convexity measuring 4 mm in maximal thickness. There is no mass effect on the underlying brain. There is new intermediate to low-density expansion of the subdural space around the majority of the left cerebral convexity, consistent with hygroma mixing/subacute hematoma. Subtle sulcal density and left parietal region, suspicious for trace subarachnoid hemorrhage. No evidence of parenchymal hemorrhage or acute infarct. There is extensive chronic small vessel disease with ischemic gliosis throughout the bilateral periventricular white matter. There is a remote right caudate lacunar infarct and a newly developed but established small vessel infarct around the left lateral ventricle. No hydrocephalus  CT CERVICAL SPINE FINDINGS  T1 compression fracture with mild height loss, new from 2012 but appears chronic. No subluxation or cervical spine fracture. No gross cervical canal hematoma or prevertebral edema. For age, no notable degenerative change.  Critical Value/emergent results were called by telephone at the time of interpretation on 09/29/2014 at 6:55 pm to Dr. Carmin Muskrat , who verbally acknowledged these results.  IMPRESSION: 1. Mixed density/age subdural hematoma around the left cerebral convexity, up to 4 mm in thickness. 2. 4 mm right frontal subdural hematoma. 3. Suspect trace  subarachnoid hemorrhage in the left parietal region. 4. Mild T1 compression fracture is new from 2012 but chronic in appearance. 5. Chronic small vessel disease with progression from 2014.   Electronically Signed   By: Monte Fantasia M.D.   On: 09/29/2014 18:59   Dg Shoulder Left  09/29/2014   CLINICAL DATA:  Multiple falls since last Friday. Left shoulder pain. Initial encounter.  EXAM: LEFT SHOULDER - 2+ VIEW  COMPARISON:  None.  FINDINGS: Limited axillary positioning. There is no evidence of fracture or dislocation. There is no evidence of significant arthropathy or other focal bone abnormality.  IMPRESSION: Negative.   Electronically Signed   By: Monte Fantasia M.D.   On: 09/29/2014 19:30   Dg Hip Unilat With Pelvis 2-3 Views Left  09/29/2014   CLINICAL DATA:  [place] last Friday.  Left hip pain.  EXAM: LEFT HIP (WITH PELVIS) 2-3 VIEWS  COMPARISON:  None.  FINDINGS: Both hips are normally located. No acute fracture is identified. No plain film evidence of avascular necrosis. Mild degenerative changes for age. The pubic symphysis and SI joints are intact. No definite pelvic fractures.  IMPRESSION: No acute bony findings.   Electronically Signed   By: Marijo Sanes M.D.   On: 09/29/2014 19:30    Microbiology: Recent Results (from the past 240 hour(s))  MRSA PCR Screening     Status: None   Collection Time: 09/30/14 12:57 AM  Result Value Ref Range Status   MRSA by PCR NEGATIVE NEGATIVE Final    Comment:        The GeneXpert MRSA Assay (FDA approved for NASAL specimens only), is one component of a comprehensive MRSA colonization surveillance program. It is not intended to diagnose MRSA infection nor to guide or monitor treatment for MRSA infections.      Labs: Basic Metabolic Panel:  Recent Labs Lab 09/29/14 1904 09/30/14 0945 10/01/14 0459  NA 140 141 143  K 3.1* 2.8* 3.5  CL 100 103 108  CO2 28 28 25   GLUCOSE 121* 153* 139*  BUN 32* 22 12  CREATININE 1.59* 1.00 0.62    CALCIUM 9.1 9.1 9.6   Liver Function Tests:  Recent Labs Lab 09/29/14 1904  AST 23  ALT 22  ALKPHOS 74  BILITOT 0.5  PROT 7.1  ALBUMIN 3.8   No results for input(s): LIPASE, AMYLASE in the last 168 hours. No results for input(s): AMMONIA in the last 168 hours. CBC:  Recent Labs Lab 09/29/14 1904  WBC 7.9  NEUTROABS 5.2  HGB 13.1  HCT 39.8  MCV 94.5  PLT 157   Cardiac Enzymes: No results for input(s): CKTOTAL, CKMB, CKMBINDEX, TROPONINI in the last 168 hours. BNP: BNP (last 3 results) No results for input(s): BNP in the last 8760 hours.  ProBNP (last 3 results) No results for input(s): PROBNP in the last 8760 hours.  CBG: No results for input(s): GLUCAP in the last 168 hours.     SignedLelon Frohlich  [facility] Pager: [phone] 10/01/2014, 9:42 AM

## 2014-10-01 NOTE — Progress Notes (Signed)
Pt alert with periods of confusion. VSS. Up with assistance. Saline lock removed. Discharge instructions given and discussed with patients daughter, with verbalization of understanding of instructions. Pt left floor via wheelchair with nursing staff and family member.

## 2014-10-01 NOTE — Care Management Utilization Note (Signed)
UR completed 

## 2014-10-06 ENCOUNTER — Telehealth: Payer: Self-pay | Admitting: Family Medicine

## 2014-10-06 ENCOUNTER — Other Ambulatory Visit: Payer: Self-pay | Admitting: Internal Medicine

## 2014-10-06 ENCOUNTER — Ambulatory Visit: Payer: Commercial Managed Care - HMO | Admitting: Family Medicine

## 2014-10-06 NOTE — Telephone Encounter (Signed)
Patients family member nicole calling to let you know some changes about Kathy Marshall and a call from hospice that will be made to dr pickard later  [phone]

## 2014-10-06 NOTE — Telephone Encounter (Signed)
Sent Rx for pantoprazole (PROTONIX), 40 mg, #60 with 2 refills to [facility] in [place], [place] on 10/06/14.

## 2014-10-06 NOTE — Telephone Encounter (Signed)
Ok, whatever I can do to help.

## 2014-10-06 NOTE — Telephone Encounter (Signed)
Received call from [place] with [facility].   Reports that referral was placed over the weekend to Hospice.   Requested to have MD be Hospice Attending with Hospice providing symptom management. Verbal order given.   Also requested last visit notes. Notes faxed.

## 2014-10-07 ENCOUNTER — Telehealth: Payer: Self-pay | Admitting: *Deleted

## 2014-10-07 ENCOUNTER — Ambulatory Visit: Payer: Commercial Managed Care - HMO | Admitting: Cardiovascular Disease

## 2014-10-07 NOTE — Telephone Encounter (Signed)
Submitted [employer] referral thru [facility] for authorization on 10/07/14 to Dr. Philomena Doheny cardiologist with authorization number [id number]  Requesting provider: Cammie Mcgee. Pickard,MD  Treating provider: Philomena Doheny  Number of visits: 6  Start Date:10/07/14  End Date: 04/05/15  Dx:I10-essential hypertension     I25.10-athscl heart disease of native coronary artery w/o ang pectrs    E78.0-Pure Hypercholesterolemia    Z72.0-Tobacco use  Copy has been sent to cardiolgist office for records

## 2014-10-10 ENCOUNTER — Telehealth: Payer: Self-pay | Admitting: *Deleted

## 2014-10-10 NOTE — Telephone Encounter (Signed)
Received call from Pearson Forster, Hospice nurse.   Reports that patient passed on 11/03/2014.  MD to be made aware.

## 2014-10-12 ENCOUNTER — Other Ambulatory Visit: Payer: Self-pay | Admitting: Family Medicine

## 2014-10-17 ENCOUNTER — Encounter: Payer: Self-pay | Admitting: Cardiovascular Disease

## 2014-10-26 DEATH — deceased

## 2016-02-02 IMAGING — CR DG CHEST 2V
2 series · 2 of 2 positions shown · non-contrast
Comparison: DG CHEST 2 VIEW dated 05/02/2013

CLINICAL DATA: Left-sided chest discomfort, history of MI and
tobacco use

EXAM:
CHEST  2 VIEW

[w chest lat]
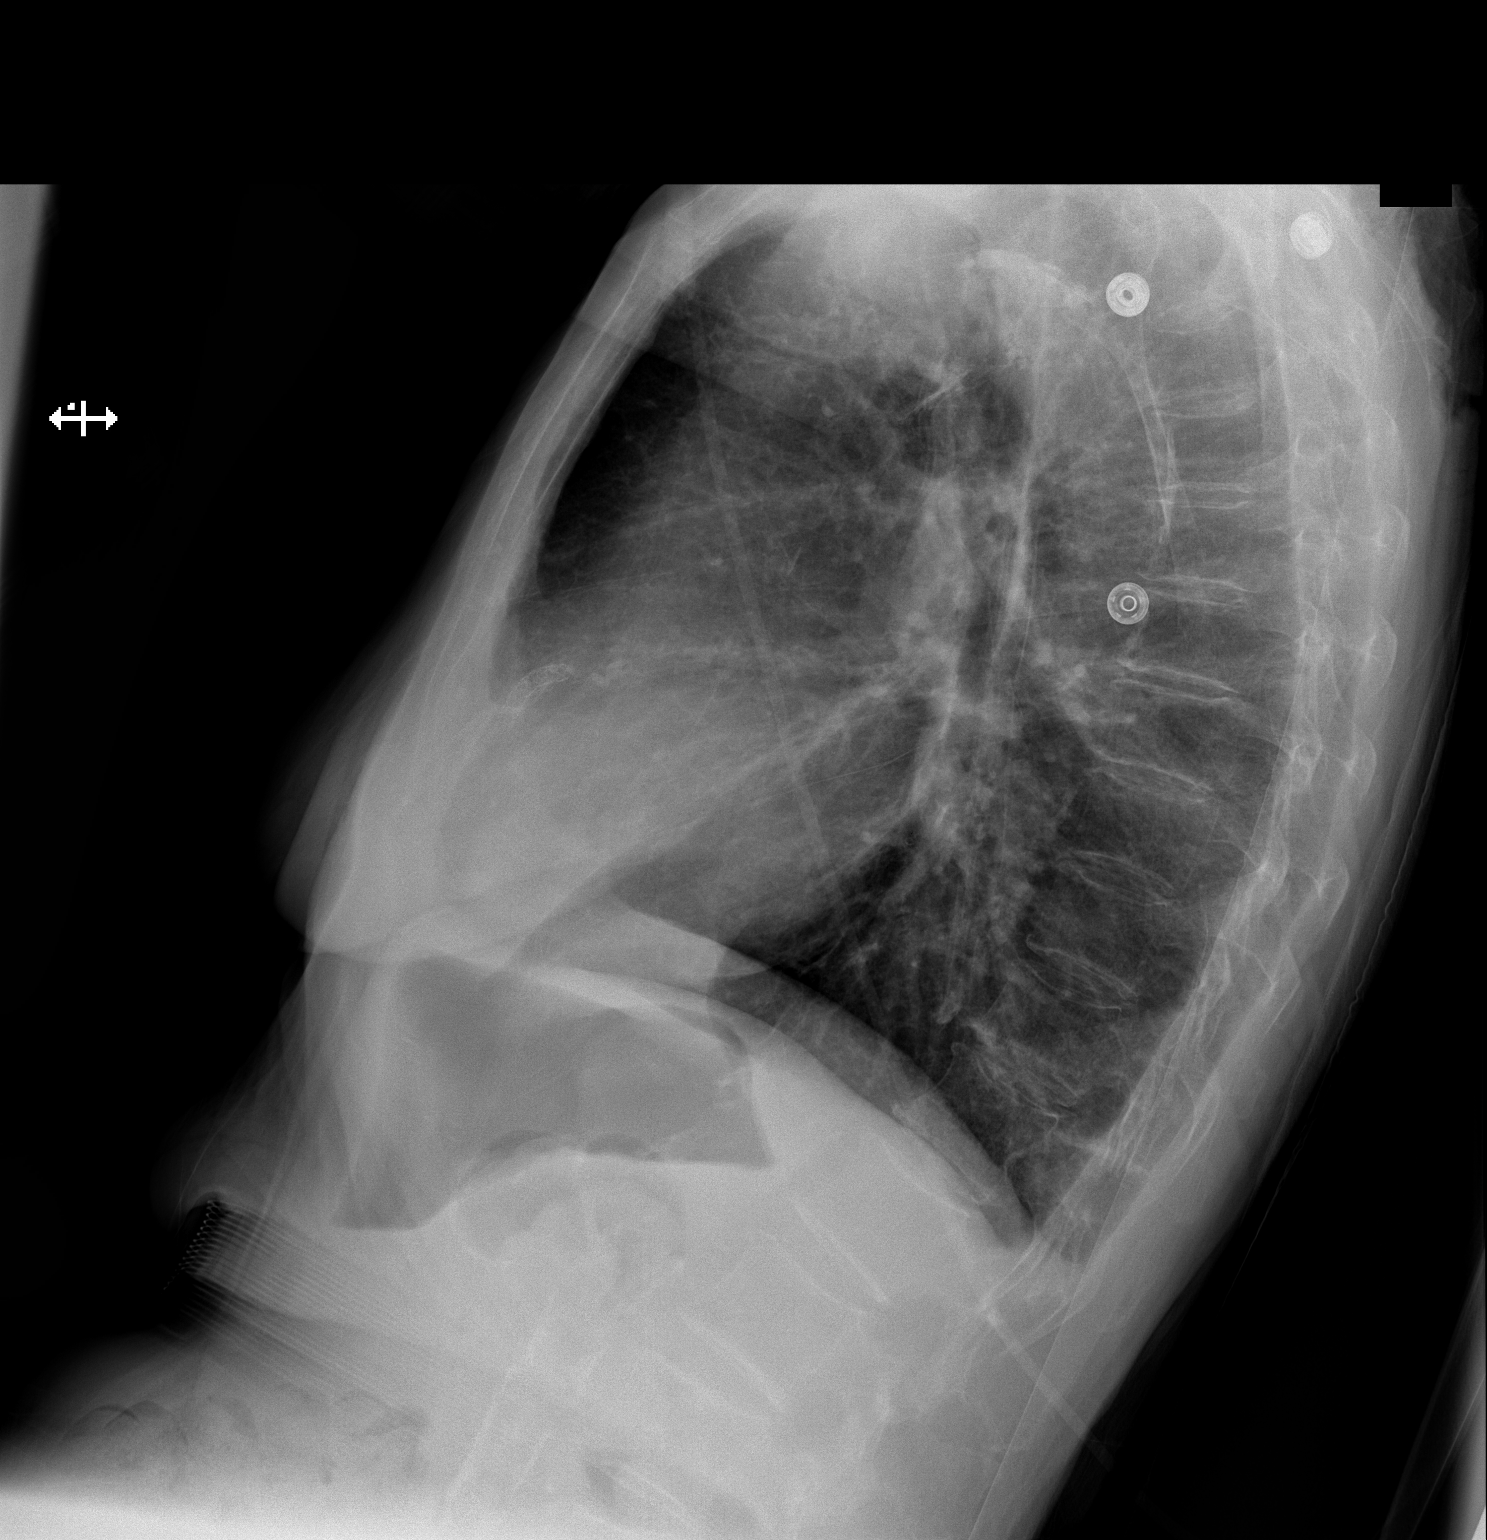

[x chest ap]
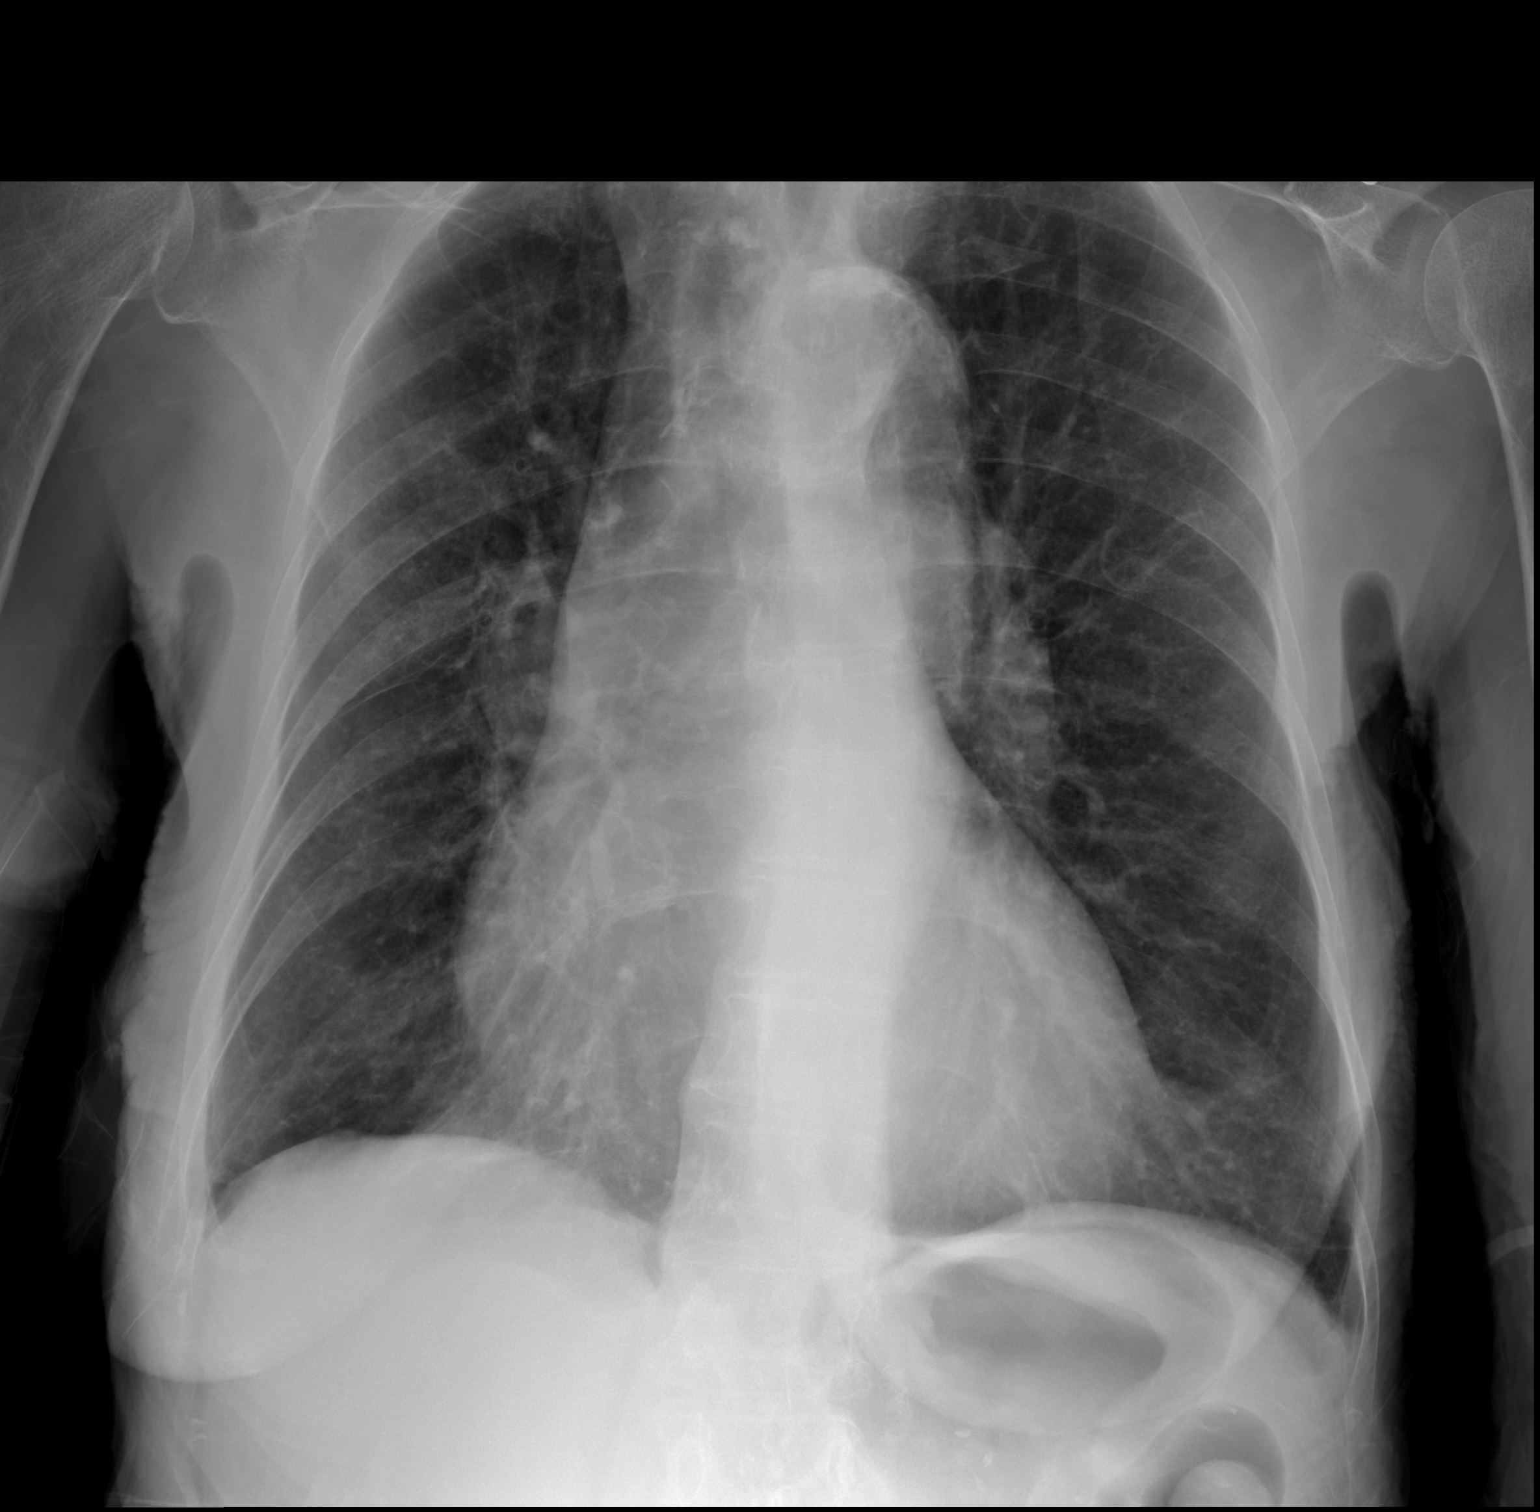

[2 of 2 positions shown; findings below may reference images not displayed]

FINDINGS: The lungs are hyperinflated. There is no focal infiltrate. The
subtle parenchymal density in the left upper lobe noted on the
previous study is no longer evident. The cardiac silhouette is
mildly enlarged. Coronary artery calcification versus stent is
present over the right aspect of the cardiac silhouette anteriorly.
The pulmonary vascularity is not engorged. The mediastinum is normal
in width. There is mild tortuosity of the descending thoracic aorta.
The observed portions of the bony thorax exhibit no acute
abnormalities.
IMPRESSION: 1. There is hyperinflation consistent with COPD. There is no
evidence of pneumonia nor pleural effusion. No pulmonary parenchymal
nodules or masses are demonstrated.
2. There is mild enlargement of the cardiac silhouette today as
compared to the previous study. No pulmonary vascular congestion is
demonstrated.

## 2017-01-31 IMAGING — CR DG CHEST 2V
2 series · 2 of 2 positions shown · non-contrast
Comparison: Portable chest x-ray May 01, 2014 and August 13, 2013.

CLINICAL DATA: COPD, history of CHF with stent placement, history
of tobacco use. Now with shortness of breath ; history of atypical
mycobacterial disease

EXAM:
CHEST  2 VIEW

[view not recorded (1 of 2)]
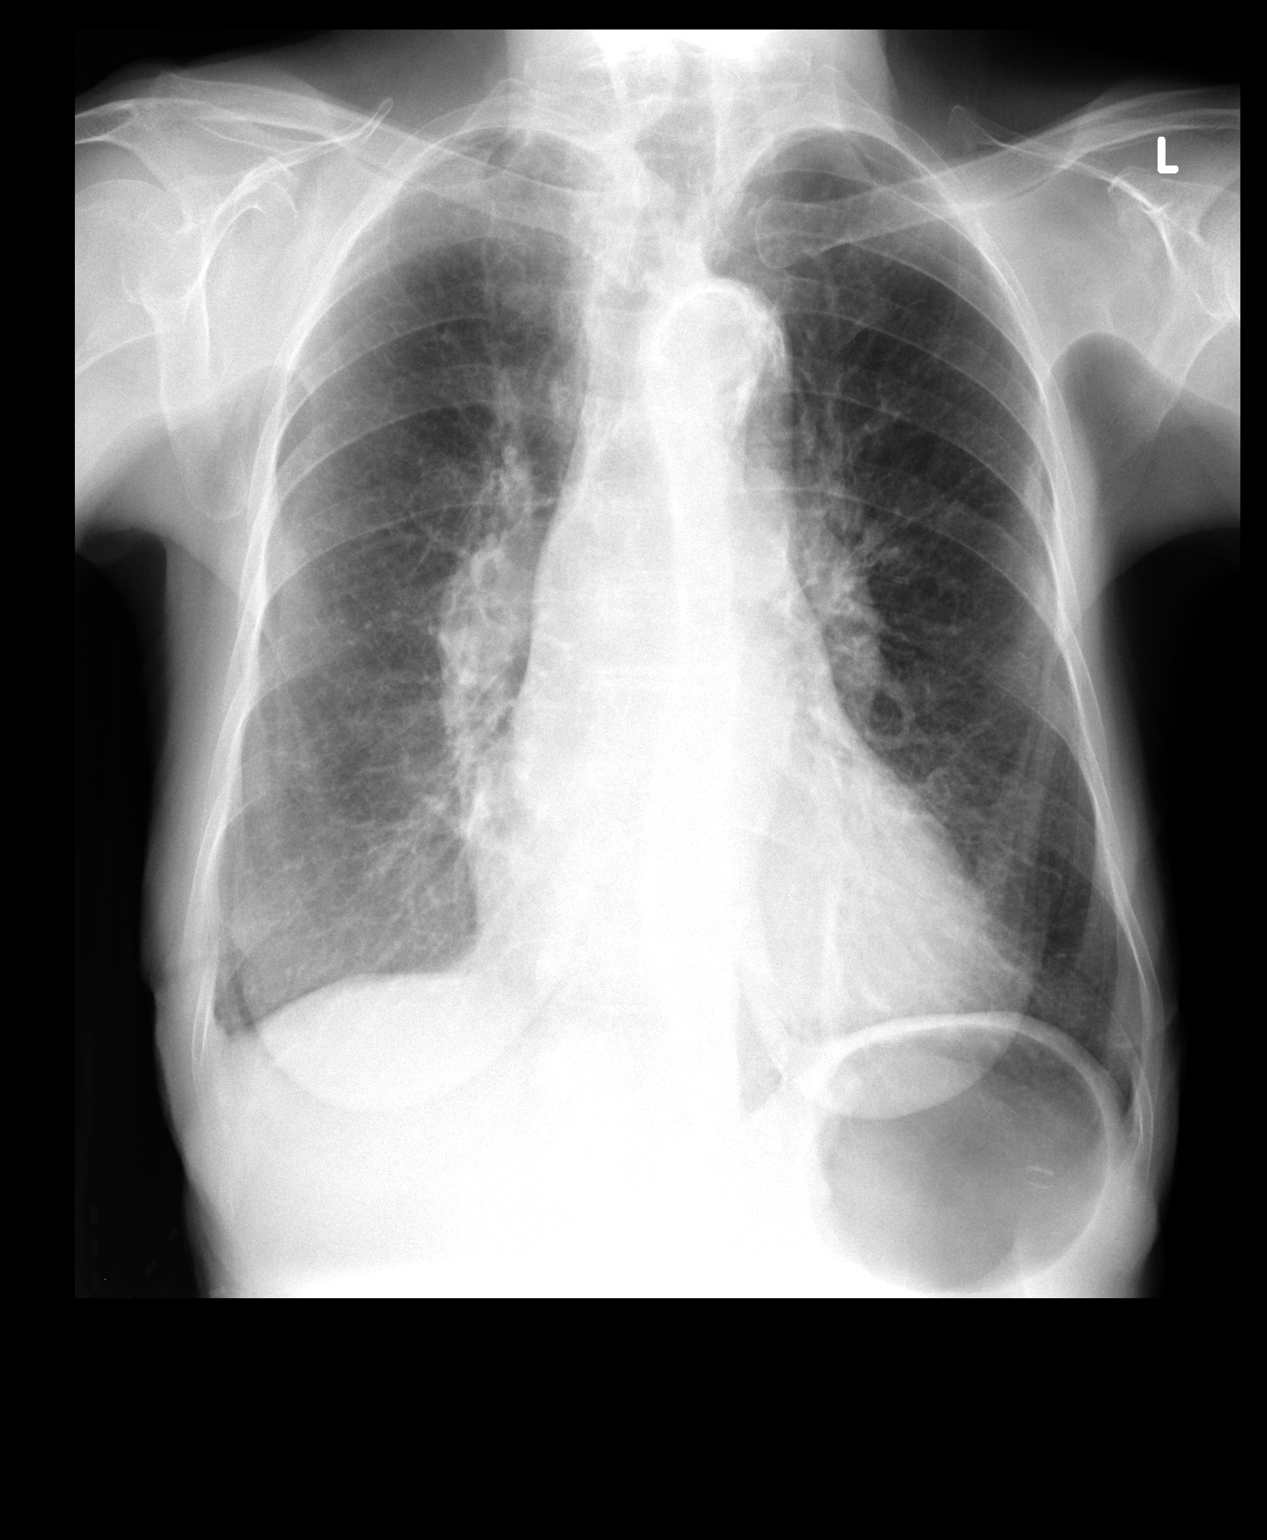

[view not recorded (2 of 2)]
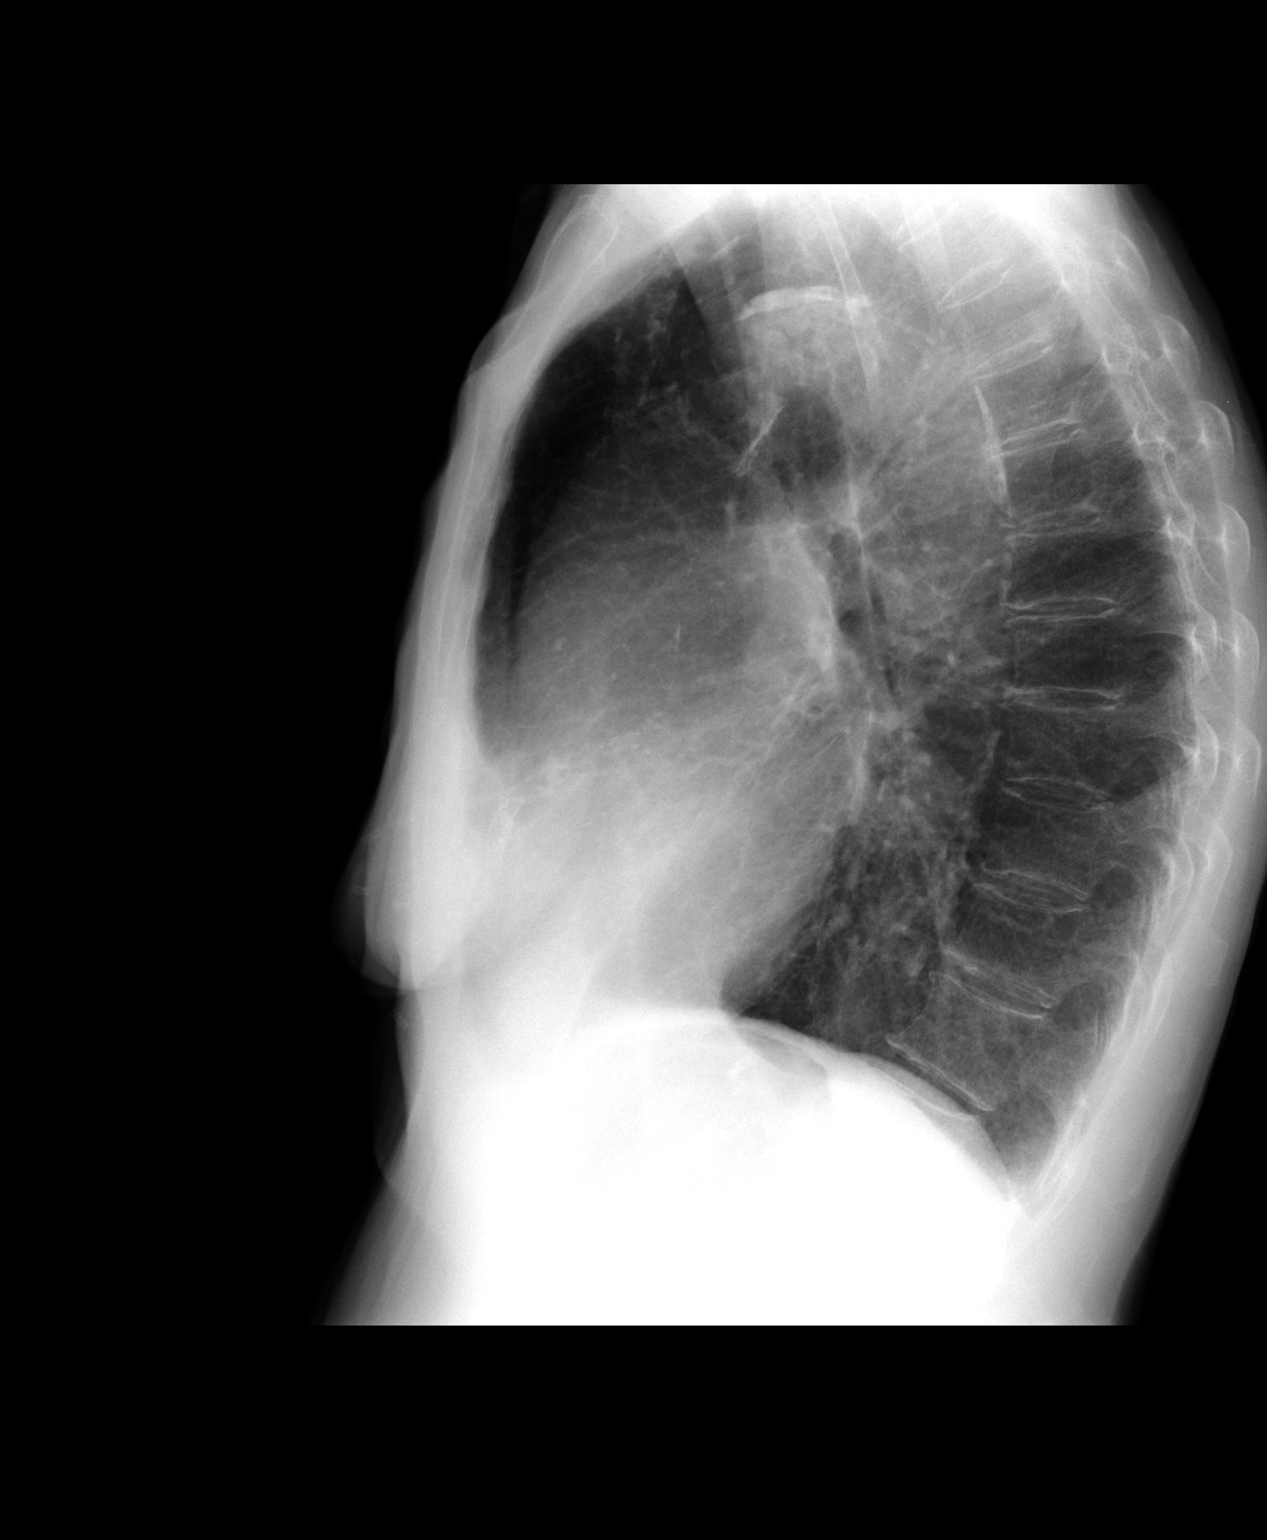

[2 of 2 positions shown; findings below may reference images not displayed]

FINDINGS: The lungs are hyperinflated. There is no focal infiltrate. There is
increased density in the left retrocardiac region medially that is
slightly more conspicuous than in the past. The cardiac silhouette
is top-normal in size. A coronary artery stent is visible on the
lateral film in the retrosternal region. The pulmonary vascularity
is not engorged. The mediastinum is normal in width. There is
calcification in the wall of the thoracic aorta. The bony thorax is
unremarkable.
IMPRESSION: COPD. There are coarse lung markings in the retrocardiac region on
the left which are slightly more conspicuous than in the past. No
acute pneumonia is demonstrated.

## 2017-02-07 IMAGING — DX DG CHEST 1V PORT
1 series · 1 of 1 positions shown · non-contrast
Comparison: 08/12/2014

CLINICAL DATA: Post bronchoscopy and biopsy

EXAM:
PORTABLE CHEST - 1 VIEW

[chest ap]
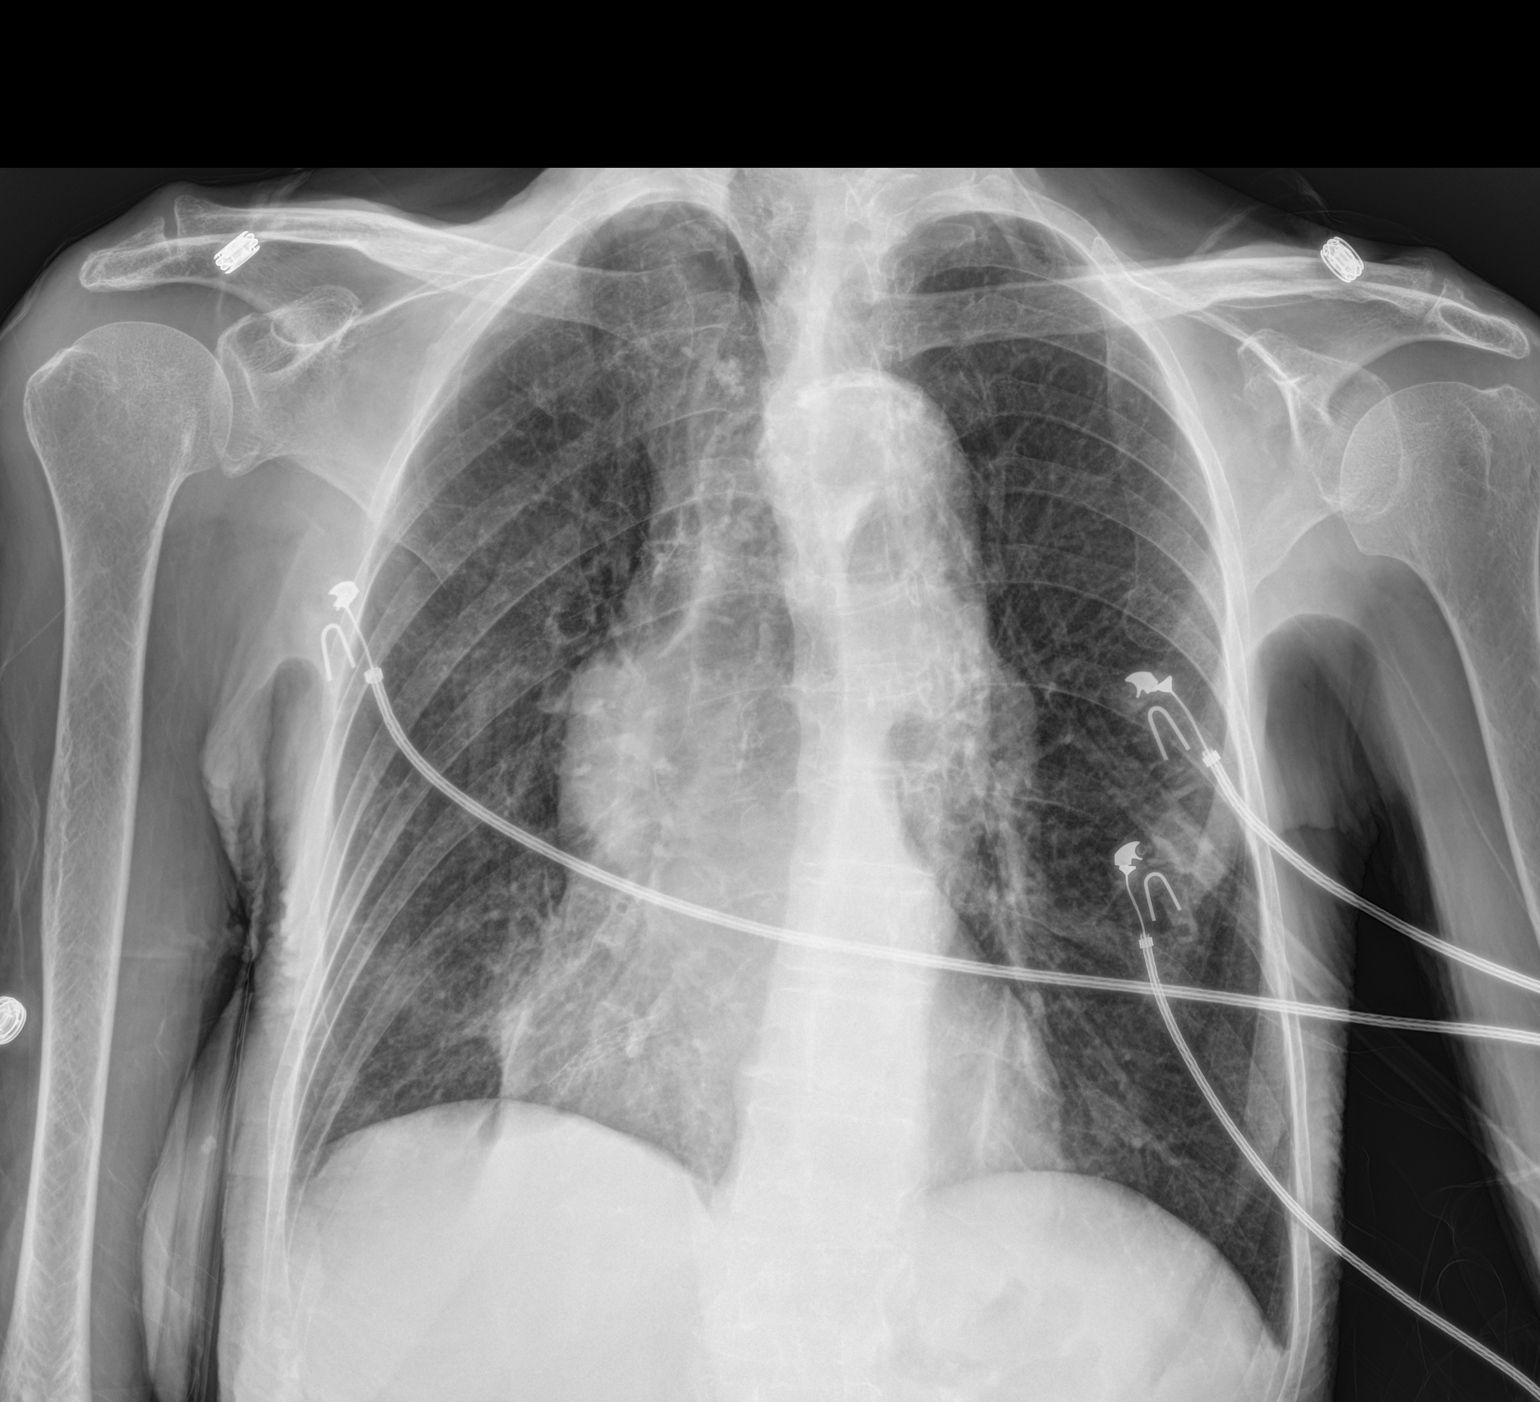

[1 of 1 positions shown; findings below may reference images not displayed]

FINDINGS: Cardiomediastinal silhouette is stable. Hyperinflation again noted.
Persistent bronchiectasis and bronchitic changes right infrahilar
region. No segmental infiltrate or pulmonary edema. No pneumothorax.
Atherosclerotic calcifications of thoracic aorta again noted.
IMPRESSION: Hyperinflation again noted. Persistent bronchiectasis and bronchitic
changes right infrahilar region. No segmental infiltrate or
pulmonary edema. No pneumothorax. Atherosclerotic calcifications of
thoracic aorta again noted.
